# Patient Record
Sex: Male | Born: 1945 | Race: White | Hispanic: No | Smoking: Former smoker
Health system: Southern US, Community
[De-identification: ages and names within clinical notes are randomized; demographics above are authoritative.]

## PROBLEM LIST (undated history)

## (undated) DIAGNOSIS — J189 Pneumonia, unspecified organism: Secondary | ICD-10-CM

## (undated) DIAGNOSIS — IMO0002 Reserved for concepts with insufficient information to code with codable children: Secondary | ICD-10-CM

## (undated) DIAGNOSIS — I4819 Other persistent atrial fibrillation: Secondary | ICD-10-CM

## (undated) DIAGNOSIS — I10 Essential (primary) hypertension: Secondary | ICD-10-CM

## (undated) DIAGNOSIS — Z7901 Long term (current) use of anticoagulants: Secondary | ICD-10-CM

## (undated) DIAGNOSIS — E785 Hyperlipidemia, unspecified: Secondary | ICD-10-CM

## (undated) DIAGNOSIS — T8859XA Other complications of anesthesia, initial encounter: Secondary | ICD-10-CM

## (undated) HISTORY — PX: HIP ARTHROSCOPY: SUR88

## (undated) HISTORY — PX: APPENDECTOMY: SHX54

## (undated) HISTORY — DX: Hyperlipidemia, unspecified: E78.5

## (undated) HISTORY — PX: CARDIOVERSION: SHX1299

## (undated) HISTORY — PX: WISDOM TOOTH EXTRACTION: SHX21

## (undated) HISTORY — DX: Reserved for concepts with insufficient information to code with codable children: IMO0002

## (undated) HISTORY — DX: Other persistent atrial fibrillation: I48.19

## (undated) HISTORY — PX: SKIN CANCER EXCISION: SHX779

---
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
## 1968-01-22 HISTORY — PX: APPENDECTOMY: SHX54

## 2000-04-03 ENCOUNTER — Ambulatory Visit (HOSPITAL_COMMUNITY): Admission: RE | Admit: 2000-04-03 | Discharge: 2000-04-03 | Payer: Self-pay | Admitting: Gastroenterology

## 2001-03-12 ENCOUNTER — Encounter: Admission: RE | Admit: 2001-03-12 | Discharge: 2001-03-12 | Payer: Self-pay | Admitting: Family Medicine

## 2001-03-12 ENCOUNTER — Encounter: Payer: Self-pay | Admitting: Family Medicine

## 2003-03-07 ENCOUNTER — Ambulatory Visit (HOSPITAL_COMMUNITY): Admission: RE | Admit: 2003-03-07 | Discharge: 2003-03-07 | Payer: Self-pay | Admitting: Family Medicine

## 2004-08-20 ENCOUNTER — Ambulatory Visit: Payer: Self-pay | Admitting: Cardiology

## 2004-08-30 ENCOUNTER — Ambulatory Visit: Payer: Self-pay

## 2005-08-21 ENCOUNTER — Ambulatory Visit: Payer: Self-pay | Admitting: Cardiology

## 2006-08-25 ENCOUNTER — Ambulatory Visit: Payer: Self-pay | Admitting: Cardiology

## 2006-09-02 ENCOUNTER — Ambulatory Visit: Payer: Self-pay

## 2006-09-02 ENCOUNTER — Encounter: Payer: Self-pay | Admitting: Cardiology

## 2007-05-02 ENCOUNTER — Emergency Department (HOSPITAL_COMMUNITY): Admission: EM | Admit: 2007-05-02 | Discharge: 2007-05-02 | Payer: Self-pay | Admitting: Emergency Medicine

## 2007-05-07 ENCOUNTER — Ambulatory Visit: Payer: Self-pay | Admitting: Cardiology

## 2007-06-08 ENCOUNTER — Ambulatory Visit: Payer: Self-pay | Admitting: Cardiology

## 2007-07-03 ENCOUNTER — Emergency Department (HOSPITAL_COMMUNITY): Admission: EM | Admit: 2007-07-03 | Discharge: 2007-07-03 | Payer: Self-pay | Admitting: Emergency Medicine

## 2007-07-03 ENCOUNTER — Ambulatory Visit: Payer: Self-pay | Admitting: Internal Medicine

## 2007-07-13 ENCOUNTER — Ambulatory Visit: Payer: Self-pay | Admitting: Internal Medicine

## 2007-08-04 ENCOUNTER — Ambulatory Visit: Payer: Self-pay | Admitting: Cardiology

## 2008-02-02 ENCOUNTER — Ambulatory Visit: Payer: Self-pay | Admitting: Cardiology

## 2008-05-17 ENCOUNTER — Encounter: Admission: RE | Admit: 2008-05-17 | Discharge: 2008-05-17 | Payer: Self-pay | Admitting: Family Medicine

## 2008-06-15 ENCOUNTER — Encounter (INDEPENDENT_AMBULATORY_CARE_PROVIDER_SITE_OTHER): Payer: Self-pay | Admitting: *Deleted

## 2008-07-01 ENCOUNTER — Encounter: Payer: Self-pay | Admitting: Cardiology

## 2008-07-14 ENCOUNTER — Encounter: Payer: Self-pay | Admitting: Cardiology

## 2008-08-13 DIAGNOSIS — I4891 Unspecified atrial fibrillation: Secondary | ICD-10-CM | POA: Insufficient documentation

## 2008-08-13 DIAGNOSIS — I421 Obstructive hypertrophic cardiomyopathy: Secondary | ICD-10-CM | POA: Insufficient documentation

## 2008-08-13 DIAGNOSIS — E782 Mixed hyperlipidemia: Secondary | ICD-10-CM | POA: Insufficient documentation

## 2008-08-13 DIAGNOSIS — E785 Hyperlipidemia, unspecified: Secondary | ICD-10-CM

## 2008-08-24 ENCOUNTER — Ambulatory Visit: Payer: Self-pay | Admitting: Cardiology

## 2008-08-24 DIAGNOSIS — R0989 Other specified symptoms and signs involving the circulatory and respiratory systems: Secondary | ICD-10-CM | POA: Insufficient documentation

## 2008-08-29 ENCOUNTER — Ambulatory Visit: Payer: Self-pay

## 2008-08-29 ENCOUNTER — Encounter: Payer: Self-pay | Admitting: Cardiovascular Disease

## 2009-01-04 ENCOUNTER — Encounter (INDEPENDENT_AMBULATORY_CARE_PROVIDER_SITE_OTHER): Payer: Self-pay | Admitting: *Deleted

## 2009-03-02 ENCOUNTER — Ambulatory Visit: Payer: Self-pay | Admitting: Cardiology

## 2009-03-28 ENCOUNTER — Ambulatory Visit (HOSPITAL_COMMUNITY): Admission: RE | Admit: 2009-03-28 | Discharge: 2009-03-28 | Payer: Self-pay | Admitting: Cardiology

## 2009-03-28 ENCOUNTER — Ambulatory Visit: Payer: Self-pay

## 2009-03-28 ENCOUNTER — Ambulatory Visit: Payer: Self-pay | Admitting: Cardiovascular Disease

## 2009-03-28 ENCOUNTER — Encounter: Payer: Self-pay | Admitting: Cardiology

## 2009-03-29 ENCOUNTER — Encounter: Payer: Self-pay | Admitting: Physician Assistant

## 2009-03-29 ENCOUNTER — Telehealth: Payer: Self-pay | Admitting: Cardiology

## 2009-03-29 ENCOUNTER — Ambulatory Visit: Payer: Self-pay | Admitting: Cardiovascular Disease

## 2009-04-03 ENCOUNTER — Encounter: Payer: Self-pay | Admitting: Cardiology

## 2009-04-03 ENCOUNTER — Encounter: Admission: RE | Admit: 2009-04-03 | Discharge: 2009-04-03 | Payer: Self-pay | Admitting: Family Medicine

## 2009-04-12 ENCOUNTER — Ambulatory Visit: Payer: Self-pay | Admitting: Cardiology

## 2009-05-08 ENCOUNTER — Ambulatory Visit: Payer: Self-pay

## 2009-05-08 ENCOUNTER — Ambulatory Visit: Payer: Self-pay | Admitting: Cardiology

## 2009-08-21 ENCOUNTER — Encounter (INDEPENDENT_AMBULATORY_CARE_PROVIDER_SITE_OTHER): Payer: Self-pay | Admitting: *Deleted

## 2009-08-21 ENCOUNTER — Telehealth: Payer: Self-pay | Admitting: Cardiology

## 2009-08-21 ENCOUNTER — Ambulatory Visit: Payer: Self-pay | Admitting: Cardiology

## 2009-08-21 DIAGNOSIS — I4892 Unspecified atrial flutter: Secondary | ICD-10-CM | POA: Insufficient documentation

## 2009-08-21 DIAGNOSIS — R9431 Abnormal electrocardiogram [ECG] [EKG]: Secondary | ICD-10-CM | POA: Insufficient documentation

## 2009-08-28 ENCOUNTER — Ambulatory Visit: Payer: Self-pay | Admitting: Internal Medicine

## 2009-12-25 ENCOUNTER — Encounter: Payer: Self-pay | Admitting: Internal Medicine

## 2009-12-25 ENCOUNTER — Telehealth: Payer: Self-pay | Admitting: Internal Medicine

## 2009-12-25 ENCOUNTER — Ambulatory Visit: Payer: Self-pay

## 2009-12-27 ENCOUNTER — Ambulatory Visit: Payer: Self-pay | Admitting: Internal Medicine

## 2009-12-27 LAB — CONVERTED CEMR LAB
BUN: 19 mg/dL (ref 6–23)
Basophils Absolute: 0 10*3/uL (ref 0.0–0.1)
Basophils Relative: 0.5 % (ref 0.0–3.0)
CO2: 29 meq/L (ref 19–32)
Calcium: 9.2 mg/dL (ref 8.4–10.5)
Chloride: 102 meq/L (ref 96–112)
Creatinine, Ser: 1 mg/dL (ref 0.4–1.5)
Eosinophils Absolute: 0.2 10*3/uL (ref 0.0–0.7)
Eosinophils Relative: 2.9 % (ref 0.0–5.0)
GFR calc non Af Amer: 79.32 mL/min (ref >60.00)
Glucose, Bld: 92 mg/dL (ref 70–99)
HCT: 45.4 % (ref 39.0–52.0)
Hemoglobin: 15.6 g/dL (ref 13.0–17.0)
Lymphocytes Relative: 47.4 % — ABNORMAL HIGH (ref 12.0–46.0)
Lymphs Abs: 2.7 10*3/uL (ref 0.7–4.0)
MCHC: 34.3 g/dL (ref 30.0–36.0)
MCV: 90.2 fL (ref 78.0–100.0)
Magnesium: 2.1 mg/dL (ref 1.5–2.5)
Monocytes Absolute: 0.5 10*3/uL (ref 0.1–1.0)
Monocytes Relative: 8.1 % (ref 3.0–12.0)
Neutro Abs: 2.4 10*3/uL (ref 1.4–7.7)
Neutrophils Relative %: 41.1 % — ABNORMAL LOW (ref 43.0–77.0)
Platelets: 190 10*3/uL (ref 150.0–400.0)
Potassium: 4.1 meq/L (ref 3.5–5.1)
RBC: 5.04 M/uL (ref 4.22–5.81)
RDW: 12.6 % (ref 11.5–14.6)
Sodium: 137 meq/L (ref 135–145)
WBC: 5.7 10*3/uL (ref 4.5–10.5)

## 2009-12-28 ENCOUNTER — Encounter: Payer: Self-pay | Admitting: Internal Medicine

## 2010-01-02 ENCOUNTER — Telehealth: Payer: Self-pay | Admitting: Internal Medicine

## 2010-01-03 ENCOUNTER — Encounter: Payer: Self-pay | Admitting: Internal Medicine

## 2010-01-08 ENCOUNTER — Telehealth: Payer: Self-pay | Admitting: Internal Medicine

## 2010-01-26 ENCOUNTER — Encounter (INDEPENDENT_AMBULATORY_CARE_PROVIDER_SITE_OTHER): Payer: Self-pay | Admitting: *Deleted

## 2010-01-26 ENCOUNTER — Ambulatory Visit
Admission: RE | Admit: 2010-01-26 | Discharge: 2010-01-26 | Payer: Self-pay | Source: Home / Self Care | Attending: Internal Medicine | Admitting: Internal Medicine

## 2010-01-26 ENCOUNTER — Encounter: Payer: Self-pay | Admitting: Internal Medicine

## 2010-02-15 ENCOUNTER — Other Ambulatory Visit: Payer: Self-pay | Admitting: Internal Medicine

## 2010-02-15 ENCOUNTER — Ambulatory Visit
Admission: RE | Admit: 2010-02-15 | Discharge: 2010-02-15 | Payer: Self-pay | Source: Home / Self Care | Attending: Internal Medicine | Admitting: Internal Medicine

## 2010-02-15 LAB — CBC WITH DIFFERENTIAL/PLATELET
Basophils Absolute: 0 10*3/uL (ref 0.0–0.1)
Basophils Relative: 0.3 % (ref 0.0–3.0)
Eosinophils Absolute: 0.2 10*3/uL (ref 0.0–0.7)
Eosinophils Relative: 3 % (ref 0.0–5.0)
HCT: 47 % (ref 39.0–52.0)
Hemoglobin: 16 g/dL (ref 13.0–17.0)
Lymphocytes Relative: 42.1 % (ref 12.0–46.0)
Lymphs Abs: 2.8 10*3/uL (ref 0.7–4.0)
MCHC: 34 g/dL (ref 30.0–36.0)
MCV: 90.6 fl (ref 78.0–100.0)
Monocytes Absolute: 0.4 10*3/uL (ref 0.1–1.0)
Monocytes Relative: 5.9 % (ref 3.0–12.0)
Neutro Abs: 3.3 10*3/uL (ref 1.4–7.7)
Neutrophils Relative %: 48.7 % (ref 43.0–77.0)
Platelets: 209 10*3/uL (ref 150.0–400.0)
RBC: 5.19 Mil/uL (ref 4.22–5.81)
RDW: 12.9 % (ref 11.5–14.6)
WBC: 6.7 10*3/uL (ref 4.5–10.5)

## 2010-02-15 LAB — BASIC METABOLIC PANEL
BUN: 15 mg/dL (ref 6–23)
CO2: 32 mEq/L (ref 19–32)
Calcium: 9.6 mg/dL (ref 8.4–10.5)
Chloride: 99 mEq/L (ref 96–112)
Creatinine, Ser: 0.9 mg/dL (ref 0.4–1.5)
GFR: 86.21 mL/min (ref >60.00)
Glucose, Bld: 77 mg/dL (ref 70–99)
Potassium: 4.8 mEq/L (ref 3.5–5.1)
Sodium: 139 mEq/L (ref 135–145)

## 2010-02-15 LAB — PROTIME-INR
INR: 1.3 ratio — ABNORMAL HIGH (ref 0.8–1.0)
Prothrombin Time: 14.3 s — ABNORMAL HIGH (ref 10.2–12.4)

## 2010-02-15 LAB — APTT: aPTT: 52.9 s — ABNORMAL HIGH (ref 21.7–28.8)

## 2010-02-20 NOTE — Assessment & Plan Note (Signed)
Summary: ROV- a- fib  Medications Added ASPIRIN 325 MG TABS (ASPIRIN) take one tablet by mouth once daily      Allergies Added: NKDA  Primary Provider:  Bradd Canary  CC:  Add on- A-fib. The pt noticed an irregular heart beat last night that continued to this morning. He has not had any c/o an irregular heart beat since 2009. The pt states he has temporarily been using mobic/soma/ and hydrocodone for back pain. He has also been taking doxycycline for bronchitis. Marland Kitchen  History of Present Illness: This is a 65 year old white male patient, who has history of atrial fibrillation back in 2009 at which time he converted to normal sinus rhythm on Norpace and metoprolol. He recently saw Dr. Elijah Birk wall back in followup and was doing well without any problems. March 02, 2009. 2-D echo was ordered to followup hypertrophic cardiomyopathy. 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 There was no SAM or LVOT gradient. Ejection fraction was 55-60%.  Yesterday after the echo he noticed his heart was beating irregularly. This lasted throughout the night and again this morning. The patient states he developed an upper respiratory infection last Monday. He had significant bronchitis and saw his primary care physician on Friday, at which time he was placed on doxycycline. He says he was coughing quite a bit, but never did take any over-the-counter medications for this.He was asked to come in for evaluation is found to be back in atrial fibrillation.He denies chest pain, palpitations, dyspnea, and dyspnea on exertion, dizziness, or presyncope.  Current Medications (verified): 1)  Lipitor 10 Mg Tabs (Atorvastatin Calcium) .Marland Kitchen.. 1 Tab Once Daily 2)  Metoprolol Tartrate 25 Mg Tabs (Metoprolol Tartrate) .Marland Kitchen.. 1 Tab Two Times A Day 3)  Norpace 150 Mg Caps (Disopyramide Phosphate) .Marland Kitchen.. 1 Cap Two Times A Day 4)  Aspirin 81 Mg Tbec (Aspirin) .... Take One Tablet By Mouth Daily 5)   Multivitamins   Tabs (Multiple Vitamin) .Marland Kitchen.. 1 Tab Once Daily 6)  Niaspan 1000 Mg Cr-Tabs (Niacin (Antihyperlipidemic)) .Marland Kitchen.. 1 Tab Once Daily 7)  Fish Oil 1000 Mg Caps (Omega-3 Fatty Acids) .Marland Kitchen.. 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 Neck: No JVD, HJR, Bruit, or thyroid enlargement Lungs: No tachypnea, clear without wheezing, rales, or rhonchi Cardiovascular:irregular, PMI not displaced, heart sounds normal, no murmurs, gallops, bruit, thrill, or heave. Abdomen: BS normal. Soft without organomegaly, masses, lesions or tenderness. Extremities: without cyanosis, clubbing or edema. Good distal pulses bilateral SKin: Warm, no lesions or rashes  Musculoskeletal: No deformities Neuro: no focal signs    EKG  Procedure date:  03/29/2009  Findings:      atrial fibrillation at a rate of 73 beats per minute, deep T-wave inversion laterally, QT interval of 440 ms,QT control 484 ms  Impression & Recommendations:  Problem # 1:  PAROXYSMAL ATRIAL FIBRILLATION (ICD-427.31) I spoke with  Dr. Sharrell Ku about this patient given his hypertrophic cardiomyopathy and atrial fibrillation. He feels it was triggered by his upper respiratory infection in hopes that over time he will convert back to normal sinus rhythm.If he develops any symptoms of heart failure,he is to call us.He has a low [place] score. His updated medication list for this problem  includes:    Metoprolol Tartrate 25 Mg Tabs (Metoprolol tartrate) .Marland Kitchen... 1 tab two times a day    Norpace 150 Mg Caps (Disopyramide phosphate) .Marland Kitchen... 1 cap two times a day    Aspirin 325 Mg Tabs (Aspirin) .Marland Kitchen... 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 Continue current therapy. His updated medication list for this problem includes:    Metoprolol Tartrate 25 Mg Tabs (Metoprolol tartrate) .Marland Kitchen... 1 tab two times a day    Norpace 150 Mg Caps (Disopyramide phosphate) .Marland Kitchen... 1 cap two times a day    Aspirin 325 Mg Tabs (Aspirin) .Marland Kitchen... Take one tablet by mouth once daily  Patient Instructions: 1)  Your physician recommends that you schedule a follow-up appointment in: 1-2 weeks with Dr. Daleen Squibb. 2)  Your physician has recommended you make the following change in your medication: increase your aspirin to 325 mg daily. 3)  Also, limit your exercise for now.

## 2010-02-20 NOTE — Assessment & Plan Note (Signed)
Summary: 2wk f/u sl  Medications Added LORATADINE 10 MG TABS (LORATADINE) 1 tab once daily      Allergies Added: NKDA  Visit Type:  2 wk f/u Primary Provider:  Bradd Canary  CC:  pt states he was in Afib for a couple of days...denies any cp or sob or edema.  History of Present Illness: Ronnie Black comes in today for followup of his nonobstructive hypertrophic cardiomyopathy and recent recurrence of atrial fibrillation in the setting of a case of respiratory infection.  He is now feeling remarkably better and felt like he went back into normal rhythm of the weekend. His EKG today and farms normal sinus rhythm and is stable. He's back to exercising.  He did come to the office as instructed a couple weeks ago. EKG at that time showed atrial fibrillation.  He describes what sounds like walking pneumonia.  Echocardiogram on March 8 demonstrated normal left ventricular function ejection fraction 60%, hypertrophic cardiomyopathy, no outlet obstruction or gradient, mild left atrial enlargement, mild mitral regurgitation, no SAM. Findings consistent with a nonobstructive mid and apical hypertrophic cardiomyopathy. Findings are stable  Current Medications (verified): 1)  Lipitor 10 Mg Tabs (Atorvastatin Calcium) .Marland Kitchen.. 1 Tab Once Daily 2)  Metoprolol Tartrate 25 Mg Tabs (Metoprolol Tartrate) .Marland Kitchen.. 1 Tab Two Times A Day 3)  Norpace 150 Mg Caps (Disopyramide Phosphate) .Marland Kitchen.. 1 Cap Two Times A Day 4)  Aspirin 325 Mg Tabs (Aspirin) .... Take One Tablet By Mouth Once Daily 5)  Multivitamins   Tabs (Multiple Vitamin) .Marland Kitchen.. 1 Tab Once Daily 6)  Niaspan 1000 Mg Cr-Tabs (Niacin (Antihyperlipidemic)) .Marland Kitchen.. 1 Tab Once Daily 7)  Fish Oil 1000 Mg Caps (Omega-3 Fatty Acids) .Marland Kitchen.. 1 Cap Once Daily 8)  Loratadine 10 Mg Tabs (Loratadine) .Marland Kitchen.. 1 Tab Once Daily  Allergies (verified): No Known Drug Allergies  Past History:  Past Medical History: Last updated: 08/13/2008 HYPERTROPHIC OBSTRUCTIVE CARDIOMYOPATHY  (ICD-425.1) PAROXYSMAL ATRIAL FIBRILLATION (ICD-427.31) HYPERLIPIDEMIA-MIXED (ICD-272.4)    Family History: Last updated: 08/13/2008 Family History of Coronary Artery Disease: Brother s/p CABG His grandfather died at an old age from unclear causes but no clear sudden cardiac death.  There is no history of sudden cardiac death of other family members or known hypertrophic cardiomyopathy.        Social History: Last updated: 08/13/2008 Full Time..is a professor of Investment banker, corporate at [employer]  Tobacco Use - No.  Alcohol Use - yes..social Drug Use - no  Risk Factors: Smoking Status: never (08/13/2008)  Review of Systems       negative other than history of present illness.  Vital Signs:  Patient profile:   65 year old male Height:      66 inches Weight:      154 pounds BMI:     24.95 Pulse rate:   76 / minute Pulse rhythm:   irregular BP sitting:   112 / 70  (left arm) Cuff size:   large  Vitals Entered By: Danielle Rankin, CMA (April 12, 2009 3:46 PM)  Physical Exam  General:  Well developed, well nourished, in no acute distress. Head:  normocephalic and atraumatic Eyes:  PERRLA/EOM intact; conjunctiva and lids normal. Neck:  Neck supple, no JVD. No masses, thyromegaly or abnormal cervical nodes. Chest Ronnie Black:  no deformities or breast masses noted Lungs:  relatively clear Heart:  regular rate and rhythm, normal S1-S2, no significant murmur.   EKG  Procedure date:  04/12/2009  Findings:      normal sinus  rhythm, T-wave changes inferiorly and V3 through V6, back to baseline.  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 His updated medication list for this problem includes:    Metoprolol Tartrate 25 Mg Tabs (Metoprolol tartrate) .Marland Kitchen... 1 tab two times a day    Norpace 150 Mg Caps  (Disopyramide phosphate) .Marland Kitchen... 1 cap two times a day    Aspirin 325 Mg Tabs (Aspirin) .Marland Kitchen... Take one tablet by mouth once daily  Problem # 2:  PAROXYSMAL ATRIAL FIBRILLATION (ICD-427.31) Assessment: Improved  His updated medication list for this problem includes:    Metoprolol Tartrate 25 Mg Tabs (Metoprolol tartrate) .Marland Kitchen... 1 tab two times a day    Norpace 150 Mg Caps (Disopyramide phosphate) .Marland Kitchen... 1 cap two times a day    Aspirin 325 Mg Tabs (Aspirin) .Marland Kitchen... Take one tablet by mouth once daily  Orders: EKG w/ Interpretation (93000) Treadmill (Treadmill)  Patient Instructions: 1)  Your physician recommends that you schedule a follow-up appointment in: STRESS WITH DR Audreyanna Butkiewicz  2)  Your physician recommends that you continue on your current medications as directed. Please refer to the Current Medication list given to you today. 3)  Your physician has requested that you have an exercise tolerance test.  For further information please visit [email or web address]  Please also follow instruction sheet, as given.

## 2010-02-20 NOTE — Assessment & Plan Note (Signed)
Summary: ekg/afib/saf  Nurse Visit   Vital Signs:  Patient profile:   65 year old male Pulse rate:   88 / minute Pulse rhythm:   irregular BP sitting:   116 / 76  (left arm) Cuff size:   regular  Vitals Entered By: Lisabeth Devoid RN (December 25, 2009 2:45 PM)   Current Medications (verified): 1)  Lipitor 10 Mg Tabs (Atorvastatin Calcium) .Marland Kitchen.. 1 Tab Once Daily 2)  Metoprolol Tartrate 25 Mg Tabs (Metoprolol Tartrate) .Marland Kitchen.. 1 Tab Two Times A Day 3)  Norpace 150 Mg Caps (Disopyramide Phosphate) .Marland Kitchen.. 1 Cap Two Times A Day 4)  Aspirin 81 Mg Tbec (Aspirin) .... Take One Tablet By Mouth Daily 5)  Multivitamins   Tabs (Multiple Vitamin) .Marland Kitchen.. 1 Tab Once Daily 6)  Niaspan 1000 Mg Cr-Tabs (Niacin (Antihyperlipidemic)) .Marland Kitchen.. 1 Tab Once Daily 7)  Fish Oil 1000 Mg Caps (Omega-3 Fatty Acids) .Marland Kitchen.. 1 Cap Once Daily 8)  Loratadine 10 Mg Tabs (Loratadine) .... As Needed 9)  Vitamin D 1000 Unit Tabs (Cholecalciferol) .... Once Daily  Allergies (verified): No Known Drug Allergies  Visit Type:  Follow-up  CC:  irregular heart beat.

## 2010-02-20 NOTE — Miscellaneous (Signed)
Summary: med update  Clinical Lists Changes  Medications: Changed medication from NORPACE 150 MG CAPS (DISOPYRAMIDE PHOSPHATE) 1 cap two times a day to NORPACE 150 MG CAPS (DISOPYRAMIDE PHOSPHATE) 1 cap three times a day

## 2010-02-20 NOTE — Progress Notes (Signed)
Summary: having cardiac arthmia'a since yesterday   Phone Note Call from Patient   Caller: Patient [phone] Reason for Call: Talk to Nurse Summary of Call: having cardiac arthmia's since yesterday wants a [phone] Initial call taken by: Glynda Jaeger,  August 21, 2009 8:19 AM  Follow-up for Phone Call        I spoke with patient who is having an irregular heartbeat.  He says his heart rate is up to 120 with exertion ( walking upstairs)  but much lower sitting at his desk. He is not having any dyspnea nor edema.  He will be coming in at 11:00am for ekg.  Lisabeth Devoid RN       Appended Document: having cardiac arthmia'a since yesterday [name] showed atrial flutter  Heart rate 82.  No sob at rest or with exertion.  DOD Dr. Eden Emms increased norpace to three times a day for today. Also suggested EP appt. for possible atral flutter eval/ablation.  Appt. Made for 08/28/09 will follow up with Dr. Daleen Squibb 08/22/09 at 2:15pm  Appended Document: having cardiac arthmia'a since yesterday seeing EP Monday 8/8  Appended Document: having cardiac arthmia'a since yesterday  Reviewed Juanito Doom, MD  Appended Document: having cardiac arthmia'a since yesterday Pt is feeling better today.  He states he is not having any accelerated heart rate walking 1 flight of stairs.  He also says he feels to be back in normal rhythm.   He will continue Norpace  two times a day and follow-up with Dr. Johney Frame on 08/28/09. Lisabeth Devoid RN

## 2010-02-20 NOTE — Assessment & Plan Note (Signed)
Summary: F6M/ANAS  Medications Added NORPACE 150 MG CAPS (DISOPYRAMIDE PHOSPHATE) 1 cap two times a day      Allergies Added: NKDA  Visit Type:  6 mo f/u Primary Provider:  Bradd Canary   History of Present Illness: Ronnie Black returns for followup and management of his hypertrophic obstructive cardiomyopathy, paroxysmal atrial fibrillation, next hyperlipidemia.  I saw him last in August of 2010. He is unremarkably well with no recurrent atrial fibrillation on Norpace and metoprolol.  His mixed hyperlipidemia is being managed by primary care. He is actually the process of obtaining an [facility]. primary care physician.  His meds are unchanged. He is very compliant. He continues to exercise on a regular basis.  Current Medications (verified): 1)  Lipitor 10 Mg Tabs (Atorvastatin Calcium) .Marland Kitchen.. 1 Tab Once Daily 2)  Metoprolol Tartrate 25 Mg Tabs (Metoprolol Tartrate) .Marland Kitchen.. 1 Tab Two Times A Day 3)  Norpace 150 Mg Caps (Disopyramide Phosphate) .Marland Kitchen.. 1 Cap Two Times A Day 4)  Aspirin 81 Mg Tbec (Aspirin) .... Take One Tablet By Mouth Daily 5)  Multivitamins   Tabs (Multiple Vitamin) .Marland Kitchen.. 1 Tab Once Daily 6)  Niaspan 1000 Mg Cr-Tabs (Niacin (Antihyperlipidemic)) .Marland Kitchen.. 1 Tab Once Daily 7)  Fish Oil 1000 Mg Caps (Omega-3 Fatty Acids) .Marland Kitchen.. 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  There is no history of sudden cardiac death of other family members or known hypertrophic cardiomyopathy.        Social History: Last updated: 08/13/2008 Full Time..is a professor of Investment banker, corporate at  [employer]  Tobacco Use - No.  Alcohol Use - yes..social Drug Use - no  Risk Factors: Smoking Status: never (08/13/2008)  Review of Systems       negative other history of present illness  Vital Signs:  Patient profile:   65 year old male Height:      66 inches Weight:      157 pounds BMI:     25.43 Pulse rate:   53 / minute Pulse rhythm:   irregular BP sitting:   118 / 70  (left arm) Cuff size:   large  Vitals Entered By: Danielle Rankin, CMA (March 02, 2009 3:43 PM)  Physical Exam  General:  Well developed, well nourished, in no acute distress. Head:  normocephalic and atraumatic Eyes:  PERRLA/EOM intact; conjunctiva and lids normal. Neck:  Neck supple, no JVD. No masses, thyromegaly or abnormal cervical nodes. Lungs:  Clear bilaterally to auscultation and percussion. Heart:  regular rate and rhythm, soft systolic murmur along left sternal border. Abdomen:  Bowel sounds positive; abdomen soft and non-tender without masses, organomegaly, or hernias noted. No hepatosplenomegaly. Msk:  Back normal, normal gait. Muscle strength and tone normal. Pulses:  pulses normal in all 4 extremities Extremities:  No clubbing or cyanosis. Neurologic:  Alert and oriented x 3. Skin:  Intact without lesions or rashes. Psych:  Normal affect.   EKG  Procedure date:  03/02/2009  Findings:      sinus bradycardia, ST segment changes diffuse leads throughout, unchanged  Impression & Recommendations:  Problem # 1:  PAROXYSMAL ATRIAL FIBRILLATION (ICD-427.31) Assessment Improved  His updated medication list for  this problem includes:    Metoprolol Tartrate 25 Mg Tabs (Metoprolol tartrate) .Marland Kitchen... 1 tab two times a day    Norpace 150 Mg Caps (Disopyramide phosphate) .Marland Kitchen... 1 cap two times a day    Aspirin 81 Mg Tbec (Aspirin) .Marland Kitchen... Take one tablet by mouth daily  Problem # 2:  HYPERTROPHIC OBSTRUCTIVE CARDIOMYOPATHY (ICD-425.1) Assessment: Unchanged Though he is stable clinically,  has been several years since we performed a 2-D echocardiogram. We will obtain this. I will see him back again in 6 months. His updated medication list for this problem includes:    Metoprolol Tartrate 25 Mg Tabs (Metoprolol tartrate) .Marland Kitchen... 1 tab two times a day    Norpace 150 Mg Caps (Disopyramide phosphate) .Marland Kitchen... 1 cap two times a day    Aspirin 81 Mg Tbec (Aspirin) .Marland Kitchen... Take one tablet by mouth daily  His updated medication list for this problem includes:    Metoprolol Tartrate 25 Mg Tabs (Metoprolol tartrate) .Marland Kitchen... 1 tab two times a day    Norpace 150 Mg Caps (Disopyramide phosphate) .Marland Kitchen... 1 cap two times a day    Aspirin 81 Mg Tbec (Aspirin) .Marland Kitchen... Take one tablet by mouth daily  Orders: Echocardiogram (Echo)  Problem # 3:  HYPERLIPIDEMIA-MIXED (ICD-272.4) Assessment: Improved  His updated medication list for this problem includes:    Lipitor 10 Mg Tabs (Atorvastatin calcium) .Marland Kitchen... 1 tab once daily    Niaspan 1000 Mg Cr-tabs (Niacin (antihyperlipidemic)) .Marland Kitchen... 1 tab once daily  His updated medication list for this problem includes:    Lipitor 10 Mg Tabs (Atorvastatin calcium) .Marland Kitchen... 1 tab once daily    Niaspan 1000 Mg Cr-tabs (Niacin (antihyperlipidemic)) .Marland Kitchen... 1 tab once daily  Patient Instructions: 1)  Your physician recommends that you schedule a follow-up appointment in: 6 MONTHS WITH DR Ilene Witcher 2)  Your physician recommends that you continue on your current medications as directed. Please refer to the Current Medication list given to you today. 3)  Your physician has requested that you have an echocardiogram.  Echocardiography is a painless test that uses sound waves to create images of your heart. It provides your doctor with information about the size and shape of your heart and how well your heart's chambers and valves are working.  This procedure takes approximately one hour. There are no restrictions for this procedure. Prescriptions: NORPACE 150 MG CAPS (DISOPYRAMIDE  PHOSPHATE) 1 cap two times a day  #60 x 11   Entered by:   Danielle Rankin, CMA   Authorized by:   Gaylord Shih, MD, [facility]   Signed by:   Danielle Rankin, CMA on 03/02/2009   Method used:   Electronically to        [facility]. [phone]* (retail)       [phone]       [place], [place]  [address]       Ph: [phone]       Fax: [phone]   RxID:   [id number]

## 2010-02-20 NOTE — Assessment & Plan Note (Signed)
Summary: new eval for a-fib/sl  Medications Added NORPACE 150 MG CAPS (DISOPYRAMIDE PHOSPHATE) 1 cap two times a day ASPIRIN 81 MG TBEC (ASPIRIN) Take one tablet by mouth daily LORATADINE 10 MG TABS (LORATADINE) as needed VITAMIN D 1000 UNIT TABS (CHOLECALCIFEROL) once daily      Allergies Added: NKDA  Visit Type:  Initial Consult Referring Provider:  Dr Daleen Squibb Primary Provider:  Bradd Canary   History of Present Illness: Ronnie Black is a pleasant 65 yo WM with a h/o nonobstructive hypertrophic CM, paroxysmal atrial fibrillation, and atrial flutter who presents today for EP consultation regarding atrial arrhythmias.  He reports initially being diagnosed with atrial fibrillation 03/2007 following an URI.  His afib spontaneously terminated and he did well until June 2009.  He was placed on Norpace.  He did well until 3/11 without further afib.  3/11, he reports having a URI and atrial fibrillation which spontaneously terminated.  He reports doing well until Sunday 08/20/09 when he developed palpitations.  He then reports having rapid palpitations.  He reports having fatigue and nervousness.  He Was evaluated 08/21/09 in our office and found to have typical appearing atrial flutter.  He took an extra Norpace and then returned to sinus rhythm.  He has had no further arrhythmias since that time.  He is unaware of any triggers or precipitants for his recent episode.  He had been doing yard work and was also stung 6 or7 times prior to his afib.   He denies associated CP or SOB.  He is otherwise without complaint today.  Current Medications (verified): 1)  Lipitor 10 Mg Tabs (Atorvastatin Calcium) .... 1 Tab Once Daily 2)  Metoprolol Tartrate 25 Mg Tabs (Metoprolol Tartrate) .... 1 Tab Two Times A Day 3)  Norpace 150 Mg Caps (Disopyramide Phosphate) .... 1 Cap Two Times A Day 4)  Aspirin 81 Mg Tbec (Aspirin) .... Take One Tablet By Mouth Daily 5)  Multivitamins   Tabs (Multiple Vitamin) .... 1 Tab Once  Daily 6)  Niaspan 1000 Mg Cr-Tabs (Niacin (Antihyperlipidemic)) .... 1 Tab Once Daily 7)  Fish Oil 1000 Mg Caps (Omega-3 Fatty Acids) .... 1 Cap Once Daily 8)  Loratadine 10 Mg Tabs (Loratadine) .... As Needed 9)  Vitamin D 1000 Unit Tabs (Cholecalciferol) .... 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 Appendectomy  Family History: Reviewed history from 08/13/2008 and no changes required. Family History of Coronary Artery Disease: Brother s/p CABG His grandfather died at an old age from unclear causes but no clear sudden cardiac death.  There is no history of sudden cardiac death of other family members or known hypertrophic cardiomyopathy.        Social History: Full Time..is a professor of political science at [employer] Lives in [place]. Married  Tobacco Use - No.  Alcohol Use - yes glass of wine or a beer with dinner Drug Use - no  Review of Systems       All systems are reviewed and negative except as listed in the HPI.   Vital Signs:  Patient profile:   64 year old male Height:      66 inches Weight:      15 3 pounds Pulse rate:   68 / minute BP  sitting:   136 / 72  (left arm)  Vitals Entered By: Laurance Flatten CMA (August 28, 2009 4:06 PM)  Physical Exam  General:  Well developed, well nourished, in no acute distress. Head:  normocephalic and atraumatic Eyes:  PERRLA/EOM intact; conjunctiva and lids normal. Mouth:  Teeth, gums and palate normal. Oral mucosa normal. Neck:  Neck supple, no JVD. No masses, thyromegaly or abnormal cervical nodes. Lungs:  Clear bilaterally to auscultation and percussion. Heart:   regular rate and rhythm, normal S1-S2, no significant murmur. Abdomen:  Bowel sounds positive; abdomen soft and non-tender without masses, organomegaly, or hernias noted. No hepatosplenomegaly. Msk:  Back normal, normal gait. Muscle strength and tone normal. Pulses:  pulses normal in all 4 extremities Extremities:  No clubbing or cyanosis. Neurologic:  Alert and oriented x 3. Skin:  Intact without lesions or rashes. Cervical Nodes:  no significant adenopathy Psych:  Normal affect.   EKG  Procedure date:  08/21/2009  Findings:      typical appearing atrial flutter, V rate 80 bpm,  LVH with repolarization abnormality  Impression & Recommendations:  Problem # 1:  ATRIAL FIBRILLATION (ICD-427.31) The patient has both symptomatic atrial fibrillation and typical appearing atrial flutter.  Therapeutic strategies for afib and atrial flutter including medicine and ablation were discussed in detail with the patient today. Risk, benefits, and alternatives to EP study and radiofrequency ablation were also discussed in detail today.  The patient understands the risks and wishes to continue medical therapy as his episodes are presently rather infrequent.  His CHADS2 score is 0.  He will therefore take ASA 325mg  daily for stroke prevention at this time. If he has increasing frequency of episodes of either afib or atrial flutter, then I would recommend ablation at that time.  Problem # 2:  ATRIAL FLUTTER (ICD-427.32) as above  Problem # 3:  HYPERTROPHIC OBSTRUCTIVE CARDIOMYOPATHY (ICD-425.1) stable no changes today  Patient Instructions: 1)  Your physician recommends that you schedule a follow-up appointment in: 6 months with Dr Johney Frame

## 2010-02-20 NOTE — Letter (Signed)
Summary: Appointment - Reminder 2  [facility], Main Office  [address]   [place], [place] [address]   Phone: [phone]  Fax: [phone]     Jun 15, 2008 MRN: [id number]   Ronnie Black [address] [place], [place]  [address]   Dear [name],  Our records indicate that it is time to schedule a follow-up appointment.  Dr.Wall recommended that you follow up with [name] in July 2010. It is very important that we reach you to schedule this appointment. We look forward to participating in your health care needs. Please contact us at the number listed above at your earliest convenience to schedule your appointment.  If you are unable to make an appointment at this time, give [name] a call so we can update our records.     Sincerely,  Lorne Skeens  [facility] Scheduling Team

## 2010-02-20 NOTE — Letter (Signed)
Summary: Cardioversion/TEE Instructions  [facility], Main Office  [address]   [place], [place] [address]   Phone: [phone]  Fax: [phone]     TEE Cardioversion Instructions  12/28/2009 MRN: [id number]  Ronnie Black [address] [place], [place]  [address]  Dear Ronnie Black, You are scheduled for a TEE Cardioversion on 01/04/10 with Dr. Elease Hashimoto.   Please arrive at the [facility] at 12:30 p.m. on the day of your procedure.  1)   DIET:  A)   Nothing to eat or drink after midnight except your medications with a sip of water.    2)   Come to the [place] office on 12/27/09 for lab work. The lab at [facility] is open from 8:30 a.m. to 1:30 p.m. and 2:30 p.m. to 5:00 p.m. The lab at [facility] is open from 7:30 a.m. to 5:30 p.m. You do not have to be fasting.  3)   MAKE SURE YOU TAKE YOUR Pradaxa 150mg  two times a day  4)    B)   YOU MAY TAKE ALL of your  medications with a small amount of water.    5)  Must have a responsible person to drive you home.  6)   Bring a current list of your medications and current insurance cards.   * Special Note:  Every effort is made to have your procedure done on time. Occasionally there are emergencies that present themselves at the hospital that may cause delays. Please be patient if a delay does occur.  * If you have any questions after you get home, please call the office at [phone].  Anselm Pancoast

## 2010-02-20 NOTE — Assessment & Plan Note (Signed)
Summary: 6 MONTH ROV./SL  Medications Added LIPITOR 10 MG TABS (ATORVASTATIN CALCIUM) 1 tab once daily ASPIRIN 81 MG TBEC (ASPIRIN) Take one tablet by mouth daily NIASPAN 1000 MG CR-TABS (NIACIN (ANTIHYPERLIPIDEMIC)) 1 tab once daily FISH OIL 1000 MG CAPS (OMEGA-3 FATTY ACIDS) 1 cap once daily      Allergies Added: NKDA  Visit Type:  6 mo f/u Primary Provider:  Bradd Canary  CC:  no cardiac complaints today.  History of Present Illness: Ronnie Black returns today for further management of his hypertrophic obstructive cardiomyopathy, mixed hyperlipidemia which is being followed by Dr. Bradd Canary, and paroxysmal atrial fibrillation.  He's had no episodes of atrial fib area to his exercise tolerance is as good as it has been for some time. He denies orthopnea PND or peripheral edema. He's had no syncope.  He brings in labs today from primary care. Total cholesterol is 144, triglycerides were 76, HDL was 49, total cholesterol to HDL ratio was 2.9, and LDL was 80. His hemoglobin A1c was normal. Homocystine was normal as was his C. reactive protein  He also had a carotid thickness studies which showed his right carotid to be that of a 65 year old man! he has had no symptoms of TIAs or mini strokes.  Current Medications (verified): 1)  Lipitor 10 Mg Tabs (Atorvastatin Calcium) .Marland Kitchen.. 1 Tab Once Daily 2)  Metoprolol Tartrate 25 Mg Tabs (Metoprolol Tartrate) .Marland Kitchen.. 1 Tab Two Times A Day 3)  Norpace 150 Mg Caps (Disopyramide Phosphate) .Marland Kitchen.. 1 Cap Two Times A Day 4)  Aspirin 81 Mg Tbec (Aspirin) .... Take One Tablet By Mouth Daily 5)  Multivitamins   Tabs (Multiple Vitamin) .Marland Kitchen.. 1 Tab Once Daily 6)  Niaspan 1000 Mg Cr-Tabs (Niacin (Antihyperlipidemic)) .Marland Kitchen.. 1 Tab Once Daily 7)  Fish Oil 1000 Mg Caps (Omega-3 Fatty Acids) .Marland Kitchen.. 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  There is no history of sudden cardiac death of other family members or known hypertrophic cardiomyopathy.        Social History: Last updated: 08/13/2008 Full Time..is a professor of Investment banker, corporate at [employer]  Tobacco Use - No.  Alcohol Use - yes..social Drug Use - no  Risk Factors: Smoking Status: never (08/13/2008)  Review of Systems       negative other than the history of present illness  Vital Signs:  Patient profile:   65 year old male Height:      66 inches Weight:      155 pounds BMI:     25.11 Pulse rate:   64 / minute Pulse rhythm:   regular BP sitting:   116 / 70  (left arm) Cuff size:   large  Vitals Entered By: Danielle Rankin, CMA (August 24, 2008 4:12 PM)  Physical Exam  General:  Well developed, well nourished, in no acute distress. Head:  normocephalic and atraumatic Eyes:  PERRLA/EOM intact; conjunctiva and lids normal. Mouth:  Teeth, gums and palate normal. Oral mucosa normal. Neck:  Neck supple, no JVD. No masses, thyromegaly or abnormal cervical nodes. Chest Anahit Klumb:  no deformities or breast masses noted Lungs:  Clear bilaterally to auscultation and percussion. Heart:  systolic murmur which seems softer than usual. S2 splits. Either carotid bruit or referred sounds in right base the neck. Abdomen:  Bowel sounds positive; abdomen soft and non-tender without masses, organomegaly, or hernias noted. No hepatosplenomegaly. Msk:  Back normal, normal gait. Muscle strength and tone normal. Pulses:  pulses normal in all 4 extremities Extremities:  No clubbing or cyanosis. Neurologic:  Alert and oriented x 3. Skin:  Intact without lesions or rashes. Psych:  Normal  affect.   Impression & Recommendations:  Problem # 1:  CAROTID BRUIT (ICD-785.9) Assessment New  Orders: Carotid Duplex (Carotid Duplex) With his abnormal carotid intimal medial thickness studies, and a soft systolic sound in his right neck, we will obtain carotid Dopplers. I suspect he'll have very little plaque or nonobstructive plaque.  Problem # 2:  HYPERTROPHIC OBSTRUCTIVE CARDIOMYOPATHY (ICD-425.1) Assessment: Unchanged  His updated medication list for this problem includes:    Metoprolol Tartrate 25 Mg Tabs (Metoprolol tartrate) .Marland Kitchen... 1 tab two times a day    Norpace 150 Mg Caps (Disopyramide phosphate) .Marland Kitchen... 1 cap two times a day    Aspirin 81 Mg Tbec (Aspirin) .Marland Kitchen... Take one tablet by mouth daily  His updated medication list for this problem includes:    Metoprolol Tartrate 25 Mg Tabs (Metoprolol tartrate) .Marland Kitchen... 1 tab two times a day    Norpace 150 Mg Caps (Disopyramide phosphate) .Marland Kitchen... 1 cap two times a day    Aspirin 81 Mg Tbec (Aspirin) .Marland Kitchen... Take one tablet by mouth daily  Problem # 3:  PAROXYSMAL ATRIAL FIBRILLATION (ICD-427.31) Assessment: Improved  His updated medication list for this problem includes:    Metoprolol Tartrate 25 Mg Tabs (Metoprolol tartrate) .Marland Kitchen... 1 tab two times a day    Norpace 150 Mg Caps (Disopyramide phosphate) .Marland Kitchen... 1 cap two times a day    Aspirin 81 Mg Tbec (Aspirin) .Marland Kitchen... Take one tablet by mouth daily  His updated medication list for this problem includes:    Metoprolol Tartrate 25 Mg Tabs (Metoprolol tartrate) .Marland Kitchen... 1 tab two times a day    Norpace 150 Mg Caps (Disopyramide phosphate) .Marland Kitchen... 1 cap two times a day    Aspirin 81 Mg Tbec (Aspirin) .Marland Kitchen... Take one tablet by mouth daily  Problem # 4:  HYPERLIPIDEMIA-MIXED (ICD-272.4) Assessment: Unchanged  His updated medication list for this problem includes:    Lipitor 10 Mg Tabs (Atorvastatin calcium) .Marland Kitchen... 1 tab once daily    Niaspan 1000 Mg Cr-tabs (Niacin (antihyperlipidemic)) .Marland Kitchen...  1 tab once daily  His updated medication list for this problem includes:    Lipitor 10 Mg Tabs (Atorvastatin calcium) .Marland Kitchen... 1 tab once daily    Niaspan 1000 Mg Cr-tabs (Niacin (antihyperlipidemic)) .Marland Kitchen... 1 tab once daily  Patient Instructions: 1)  Your physician wants you to follow-up in: 6 MONTHS.  You will receive a reminder letter in the mail two months in advance. If you don't receive a letter, please call our office to schedule the follow-up appointment. 2)  Your physician has requested that you have a carotid duplex. This test is an ultrasound of the carotid arteries in your neck. It looks at blood flow through these arteries that supply the brain with blood. Allow one hour for this exam. There are no restrictions or special instructions. Prescriptions: NIASPAN 1000 MG CR-TABS (NIACIN (ANTIHYPERLIPIDEMIC)) 1 tab once daily  #30 x 11   Entered by:   Danielle Rankin, CMA   Authorized by:   Gaylord Shih, MD, [facility]   Signed  by:   Danielle Rankin, CMA on 08/24/2008   Method used:   Electronically to        [facility]. [phone]* (retail)       [phone] [address]       [place], [place]  [address]       Ph: [phone]       Fax: [phone]   RxID:   [id number] LIPITOR 10 MG TABS (ATORVASTATIN CALCIUM) 1 tab once daily  #30 x 11   Entered by:   Danielle Rankin, CMA   Authorized by:   Gaylord Shih, MD, [facility]   Signed by:   Danielle Rankin, CMA on 08/24/2008   Method used:   Electronically to        [facility]. [phone]* (retail)       [phone]       [place], [place]  [address]       Ph: [phone]       Fax: [phone]   RxID:   [id number]

## 2010-02-20 NOTE — Progress Notes (Signed)
Summary: Irregular heart beat   Phone Note Call from Patient Call back at Home Phone [phone]   Caller: Patient Reason for Call: Talk to Nurse Summary of Call: arrythmia started yesterday afternoon, request to be seen or speak with the dr Initial call taken by: Migdalia Dk,  March 29, 2009 8:24 AM  Follow-up for Phone Call        I spoke with the pt. He states he has an irregular heart beat that started late yesterday. He was not having any symptoms with this, but did notice the irregularity while doing a pulse check. He states he has been sick for a couple of days and has been on an antibiotic since friday. He states that his heart beat is still irregular this morning. The pt is on metoprolol tart 25 mg two times a day and norpace 150mg  two times a day. He took his meds this morning about 6:30am. He is on an aspirin 81mg  once daily. He has not noticed any irregular heart beats since 2009. I have paged Dr. Daleen Squibb to review this with him. I will call the pt back. Follow-up by: Sherri Rad, RN, BSN,  March 29, 2009 8:55 AM  Additional Follow-up for Phone Call Additional follow up Details #1::        I did not get a call back from Dr. Daleen Squibb. I spoke with Elsie Amis. Per Marcelino Duster, the pt should come for an EKG and then we can go from there. I spoke with the pt and he is agreeable with this.  Additional Follow-up by: Sherri Rad, RN, BSN,  March 29, 2009 10:00 AM

## 2010-02-20 NOTE — Assessment & Plan Note (Signed)
Summary: ekg/dfg  Nurse Visit   Vital Signs:  Patient profile:   65 year old male Pulse rate:   82 / minute Pulse rhythm:   irregular BP sitting:   124 / 74  (left arm) Cuff size:   large  Vitals Entered By: Lisabeth Devoid RN (August 21, 2009 11:55 AM)   Allergies: No Known Drug Allergies  Appended Document: ekg/dfg Per Dr. Eden Emms (DOD)  Take Norpace 150mg  three times today. Follow-up with Dr. Daleen Squibb on 08/22/09 Schedule an appointment with EP doctor as soon as possible.   Clinical Lists Changes  Problems: Added new problem of ATRIAL FIBRILLATION (ICD-427.31) Added new problem of ATRIAL FLUTTER (ICD-427.32) Orders: Added new Referral order of EP Referral (Cardiology EP Ref ) - Signed      Appended Document: ekg/dfg  Reviewed Juanito Doom, MD

## 2010-02-20 NOTE — Progress Notes (Signed)
Summary: Ronnie Black wants to be seen today**pls call asap**   Phone Note Call from Patient Call back at Home Phone [phone] Call back at [phone]   Caller: Patient Reason for Call: Talk to Nurse, Talk to Doctor Summary of Call: Ronnie Black is having arrythmia's and wants to be seen today no other symptoms Initial call taken by: Omer Jack,  December 25, 2009 8:23 AM  Follow-up for Phone Call        [facility] on both phones. Mylo Red RN   Additional Follow-up for Phone Call Additional follow up Details #1::        Ronnie Black STILL HAVING ARRYTHMIA PROBLEMS AND WANTS TO BE SEEN TODAY PER JACKIE TALK TO Ronnie Black FIND OUT MORE AND THEN CALL HER TO DISCUSS PUTTING ON DOD SCHEDULE [phone] Omer Jack  December 25, 2009 12:49 PM     Additional Follow-up for Phone Call Additional follow up Details #2::    Ronnie Black calls with recurrent irregular heartbeat according to patient this began on Friday 12/22/09.  He states he took an extra Norpace on Friday and Sunday with "no difference in my heart rate".  His heart rate is in the 80's according to patient.  He also denies, shortness of breath, chest pain, and palpitations.  DOD, Dr. Tenny Craw, reviewed and patient will come in today for EKG.  Ronnie Black aware and will come in around 2:00 pm today. Mylo Red RN   Appended Document: Ronnie Black wants to be seen today**pls call asap**  Reviewed Juanito Doom, MD  Appended Document: Ronnie Black wants to be seen today**pls call asap** I called Ronnie Black this am at [phone].  He remains in afib with sypmtoms of palpitations and fatigue. I have reviewed his EKG from 12/25/09 which confirms afib despite norpace. At this time, I will stop aspirin and start pradaxa 150mg  two times a day. We will have a BMET today to confirm CrCl >50.  He will then continue norpace and proceed with TEE guided cardioversion next week. r/b/a  to TEE guided [facility] were discussed at length by me and he wishes to proceed. I will then see him back 2-3 weeks post [facility] to discuss  catheter ablation as our next option.

## 2010-02-20 NOTE — Letter (Signed)
Summary: Appointment - Reminder 2  [facility], Main Office  [address]   [place], [place] [address]   Phone: [phone]  Fax: [phone]     January 04, 2009 MRN: [id number]   ELLA GUILLOTTE [address] [place], [place]  [address]   Dear Mr. Hicklin,  Our records indicate that it is time to schedule a follow-up appointment. Dr.Wall recommended that you follow up with [name] in Feb,2011. It is very important that we reach you to schedule this appointment. We look forward to participating in your health care needs. Please contact us at the number listed above at your earliest convenience to schedule your appointment.  If you are unable to make an appointment at this time, give [name] a call so we can update our records.     Sincerely,   Glass blower/designer

## 2010-02-21 ENCOUNTER — Ambulatory Visit (HOSPITAL_COMMUNITY)
Admission: RE | Admit: 2010-02-21 | Payer: BC Managed Care – PPO | Source: Ambulatory Visit | Admitting: Cardiovascular Disease

## 2010-02-22 ENCOUNTER — Ambulatory Visit (HOSPITAL_COMMUNITY)
Admission: RE | Admit: 2010-02-22 | Discharge: 2010-02-22 | Disposition: A | Discharge status: Home / Self Care | Payer: BC Managed Care – PPO | Attending: Internal Medicine | Admitting: Internal Medicine

## 2010-02-22 ENCOUNTER — Telehealth: Payer: Self-pay | Admitting: Internal Medicine

## 2010-02-22 DIAGNOSIS — I4891 Unspecified atrial fibrillation: Secondary | ICD-10-CM

## 2010-02-22 DIAGNOSIS — I059 Rheumatic mitral valve disease, unspecified: Secondary | ICD-10-CM | POA: Insufficient documentation

## 2010-02-22 NOTE — Progress Notes (Signed)
Summary: Calling about procedure   Phone Note Call from Patient Call back at Home Phone [phone]   Caller: Patient Summary of Call: Pt calling regarding procedure on 01/04/10 Initial call taken by: Judie Grieve,  January 02, 2010 2:20 PM  Follow-up for Phone Call        pt to come in tomorrow for an EKG.  If not in afib will cx TEE/DCCV  but continue Pradaxa and f/u with Dr Johney Frame in 3 weeks  if still in afib proceed as scheduled Dennis Bast, RN, BSN  January 02, 2010 2:51 PM

## 2010-02-22 NOTE — Assessment & Plan Note (Addendum)
Summary: per check out/sf    Visit Type:  Follow-up Referring Provider:  Dr Daleen Squibb Primary Provider:  Bradd Canary  CC:  C/O FATIGUE.  History of Present Illness: Ronnie Black is a pleasant 65 yo WM with a h/o nonobstructive hypertrophic CM, persistent atrial fibrillation, and atrial flutter who presents today for EP follow-up regarding atrial arrhythmias.  He was initially diagnosed with atrial fibrillation 03/2007 following an URI.  His afib spontaneously terminated and he did well until June 2009.  He was placed on Norpace.  He did well until 3/11 without further afib.  3/11, he reports having a URI and atrial fibrillation which spontaneously terminated.  He reports doing well until Sunday 08/20/09 when he developed palpitations.  He then reports having rapid palpitations.  He reports having fatigue and nervousness.  He Was evaluated 08/21/09 in our office and found to have typical appearing atrial flutter.  He took an extra Norpace and then returned to sinus rhythm.  He has had increasing episodes of atrial fibrillation since that time.  He recently called our office with persisent atrial fibrillation.  We made plans for cardioversioin but he spontaneously returned to sinus before the procedure was performed.  Unfortunatley, several days later, he had returned to atrial fibrillation. He is unaware of any triggers or precipitants for his recent episode.   He denies associated CP or SOB.  He reports fatgue and decreased exercise tolerance during atrial fibrillation.  He is otherwise without complaint today.  Current Medications (verified): 1)  Lipitor 10 Mg Tabs (Atorvastatin Calcium) .... 1 Tab Once Daily 2)  Metoprolol Tartrate 25 Mg Tabs (Metoprolol Tartrate) .... 1 Tab Two Times A Day 3)  Norpace 150 Mg Caps (Disopyramide Phosphate) .... 1 Cap Two Times A Day 4)  Multivitamins   Tabs (Multiple Vitamin) .... 1 Tab Once Daily 5)  Niaspan 1000 Mg Cr-Tabs (Niacin (Antihyperlipidemic)) .... 1 Tab Once  Daily 6)  Fish Oil 1000 Mg Caps (Omega-3 Fatty Acids) .... 1 Cap Once Daily 7)  Loratadine 10 Mg Tabs (Loratadine) .... As Needed 8)  Vitamin D 1000 Unit Tabs (Cholecalciferol) .... Once Daily 9)  Pradaxa 150 Mg Caps (Dabigatran Etexilate Mesylate) .... One By Mouth Bid  Allergies: No Known Drug Allergies  Past History:  Past Medical History: Reviewed history from 08/28/2009 and no changes required. 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 Appendectomy  Family History: Reviewed history from 08/13/2008 and no changes required. Family History of Coronary Artery Disease: Brother s/p CABG His grandfather died at an old age from unclear causes but no clear sudden cardiac death.  There is no history of sudden cardiac death of other family members or known hypertrophic cardiomyopathy.        Social History: Reviewed history from 08/28/2009 and no changes required. Full Time..is a professor of political science at [employer] Lives in [place]. Married  Tobacco Use - No.  Alcohol Use - yes glass of wine or a beer with dinner Drug Use - no  Review of Systems       All systems are reviewed and negative except as listed in the HPI.   Vital Signs:  Patient profile:   65 year old male Height:      66 inches Weight:      158.25 pounds BMI:     25 .63 Pulse  rate:   72 / minute Pulse rhythm:   irregular BP sitting:   130 / 74  (left arm) Cuff size:   regular  Vitals Entered By: Scherrie Bateman, LPN (January 26, 2010 12:36 PM)  Physical Exam  General:  Well developed, well nourished, in no acute distress. Head:  normocephalic and atraumatic Eyes:  PERRLA/EOM intact; conjunctiva and lids normal. Mouth:  Teeth, gums and palate  normal. Oral mucosa normal. Neck:  Neck supple, no JVD. No masses, thyromegaly or abnormal cervical nodes. Lungs:  Clear bilaterally to auscultation and percussion. Heart:  iregular rate and rhythm, no significant murmur. Abdomen:  Bowel sounds positive; abdomen soft and non-tender without masses, organomegaly, or hernias noted. No hepatosplenomegaly. Msk:  Back normal, normal gait. Muscle strength and tone normal. Extremities:  No clubbing or cyanosis. Neurologic:  Alert and oriented x 3. Skin:  Intact without lesions or rashes. Psych:  Normal affect.   Echocardiogram  Procedure date:  03/28/2009  Findings:       Study Conclusions    - Left ventricle: The cavity size was mildly dilated. Wall thickness     was increased in a pattern of severe LVH. Systolic function was     normal. The estimated ejection fraction was in the range of 55% to     60%.   - Mitral valve: Mild regurgitation.   - Left atrium: The atrium was mildly dilated.   - Right atrium: The atrium was mildly to moderately dilated.   - Atrial septum: No defect or patent foramen ovale was identified.   - Impressions: Findings consistant with non-obstructive mid and     apical HOCM. No SAM or LVOT gradient   Impressions:    - Findings consistant with non-obstructive mid and apical HOCM. No     SAM or LVOT gradient   Transthoracic echocardiography. M-mode, complete 2D, spectral   Doppler, and color Doppler. Height: Height: 167.6cm. Height: 66in.   Weight: Weight: 71.2kg. Weight: 156.7lb. Body mass index: BMI:   25.3kg/m^2. Body surface area: BSA: 1.44m^2. Blood pressure: 120/70.   Patient status: Outpatient. Location: [facility]  Carotid Doppler  Procedure date:  08/29/2008  Findings:      Impressions: Normal carotid arteries, bilaterally.  Tonny Bollman, MD    EKG  Procedure date:  01/26/2010  Findings:      atrial fibrillation, incomplete RBBB, LVH with repolarization abnormality  Impression  & Recommendations:  Problem # 1:  ATRIAL FIBRILLATION (ICD-427.31) The patient has both symptomatic persistent atrial fibrillation and typical appearing atrial flutter.  Therapeutic strategies for afib and atrial flutter including medicine and ablation were discussed in detail with the patient today. At this point, our options would be to increase norpace to 300mg  BID, switch to amiodarone, or proceed with ablation.  Risk, benefits, and alternatives to EP study and radiofrequency ablation were also discussed in detail today.  The patient understands the risks and wishes to proceed with ablation. We will therefore initiate pradaxa at this time.  We will proceed with ablation once he has been on pradaxa for at least 3 weeks.  Problem # 2:  HYPERTROPHIC OBSTRUCTIVE CARDIOMYOPATHY (ICD-425.1) stable no changes today prior echo reviewed  Problem # 3:  HYPERLIPIDEMIA-MIXED (ICD-272.4) stable His updated medication list for this problem includes:    Lipitor 10 Mg Tabs (Atorvastatin calcium) .Marland Kitchen... 1 tab once daily    Niaspan 1000 Mg Cr-tabs (Niacin (antihyperlipidemic)) .Marland Kitchen... 1 tab once daily  Patient Instructions: 1)  Your physician  recommends that you continue on your current medications as directed. Please refer to the Current Medication list given to you today. 2)  Your physician has recommended that you have an ablation.  Catheter ablation is a medical procedure used to treat some cardiac arrhythmias (irregular heartbeats). During catheter ablation, a long, thin, flexible tube is put into a blood vessel in your groin (upper thigh), or neck. This tube is called an ablation catheter. It is then guided to your heart through the blood vessel. Radiofrequency waves destroy small areas of heart tissue where abnormal heartbeats may cause an arrhythmia to start.  Please see the instruction sheet given to you today.

## 2010-02-22 NOTE — Consult Note (Signed)
  NAMEABDULKARIM, Black              ACCOUNT NO.:  [id number]  MEDICAL RECORD NO.:  [id number]           PATIENT TYPE:  O  LOCATION:  [facility]                         FACILITY:  [facility]  PHYSICIAN:  Favio Moder C. Eden Emms, MD, FACCDATE OF BIRTH:  1945/09/09  DATE OF CONSULTATION: DATE OF DISCHARGE:                                TEE   A 65 year old patient of Dr. Johney Frame scheduled for atrial fibrillation ablation tomorrow.  Transesophageal echo was performed to rule out left atrial appendage clot.  The patient has been on Pradaxa for 5-6 weeks. The patient was sedated with 4 mg of Versed and 25 mcg of fentanyl.  Using digital technique, an Omniplane probe was advanced in distal esophagus without incident.  The LV cavity size was small and spade-like.  There was significant mid and apical hypertrophy.  The most basal septum only measured 12 mm below the aortic valve cusps, however, within half a centimeter the ventricles increased in thickness to 2 cm.There is no LVOT gradient or SAM.  Mitral valve was mildly thickened with trivial MR.  Left atrium and right-sided cardiac chambers were normal.  There was no atrial septal defect or PFO.  Left atrial appendage was well visualized in orthogonal views.  There is no left atrial appendage clot, thrombus or spontaneous contrast.  The left upper pulmonary vein was well visualized, then measured 1.8 cm at the ostium.  The left lower pulmonary vein measured approximately 1.5 cm at the ostium.  The right upper pulmonary vein measured approximately 1 cm and the right lower pulmonary vein was not visualized.  Imaging of the tricuspid valve showed it to be normal and trileaflet. Imaging of the aortic valve showed it to be normal and trileaflet. Imaging of the tricuspid and pulmonary valves was normal.  Imaging of aorta showed no debris.  FINAL IMPRESSION: 1. No left atrial appendage clot or thrombus. 2. Mid to apical hypertrophic cardiomyopathy with no  left ventricular     outflow tract gradient ejection fraction 60-65%, a septal thickness     2 cm. 3. Normal mitral valve with trivial mitral regurgitation. 4. Normal atrial size. 5. Normal right ventricle. 6. No aortic debris. 7. No atrial septal defect or patent foramen ovale.  The patient tolerated the procedure well and he knows to be here tomorrow for his atrial fibrillation ablation with Dr. Johney Frame.     [name]. Eden Emms, MD, [facility]     PCN/MEDQ  D:  02/21/2010  T:  02/22/2010  Job:  [id number]  Electronically Signed by Charlton Haws MD [facility] on 02/22/2010 10:39:11 PM

## 2010-02-22 NOTE — Letter (Signed)
Summary: ELectrophysiology/Ablation Procedure Instructions  [employer], Main Office  [address]   [place], [place] [address]   Phone: [phone]  Fax: [phone]     Electrophysiology/Ablation Procedure Instructions    You are scheduled for a(n) A-Fib Ablation on February 22, 2010 at 7:30 am with Dr. Johney Frame.   1.  Please come to the Short Stay Center at [facility] at 5:30 am on the day of your procedure.  2.  Come prepared to stay overnight.   Please bring your insurance cards and a list of your medications.  3.  Come to the [place] office on February 15, 2010 at 2:00 for lab work.  The lab at [facility] is open from 8:30 AM to 1:30 PM and 2:30 PM to 5:00 PM.  The lab at [facility] is open from 7:30 AM to 5:30 PM.  You do not have to be fasting.  4.  Do not have anything to eat or drink after midnight the night before your procedure.  5.  Do NOT take Pradaxa the morning of your procedure. All of your remaining medications may be taken with a small amount of water.  6.  Educational material received:  _____ EP   ____x_ Ablation   * Occasionally, EP studies and ablations can become lengthy.  Please make your family aware of this before your procedure starts.  Average time ranges from 2-8 hours for EP studies/ablations.  Your physician will locate your family after the procedure with the results.  Dennis Bast, RN  * If you have any questions after you get home, please call the office at [phone].  TEE on 2/1 at 2:30pm with Dr. Eden Emms. Arrive at short stay at 1:30pm.  You may have a clear liquid breakfast until 8:30am.  Clear liquids include water, broth, Sprite, Ginger Ale, coffee, tea (no sugar), cranberry/apple/grape juice, and popsicle from clear juices.  You will need a driver after the procedure.

## 2010-02-22 NOTE — Progress Notes (Signed)
Summary: pt req nurse call   Phone Note Call from Patient   Caller: Patient [phone] Reason for Call: Talk to Nurse Summary of Call: pt calling re arrythmias again Initial call taken by: Glynda Jaeger,  January 08, 2010 9:17 AM  Follow-up for Phone Call        Out of rhythm again over the weekend the HR is irreg and in the 80's he says he feels fine when he's working and doesn't notice it at all hurt back on the weekend-?mobic w/ Pradaxa -short term use? discussed with Weston Brass, Pharm D  Ok for short term use.  Increased risk for GI bleeding  Pt aware.  Look for signs of GI bleeding.   Dennis Bast, RN, BSN  January 08, 2010 12:28 PM

## 2010-02-23 ENCOUNTER — Telehealth: Payer: Self-pay | Admitting: Internal Medicine

## 2010-02-28 NOTE — Progress Notes (Signed)
Summary: pt rs hospital procedure   Phone Note Call from Patient   Caller: Patient [phone] Reason for Call: Talk to Nurse Summary of Call: pt calling re rs a hospital procedure Initial call taken by: Glynda Jaeger,  February 22, 2010 11:30 AM  Follow-up for Phone Call        Pt calliing about rescheduled ablation.  He is aware Tresa Endo will be in tomorrow.  He will await call from [facility] on his Cell # [phone] Follow-up by: Lisabeth Devoid RN,  February 22, 2010 12:17 PM

## 2010-03-12 ENCOUNTER — Encounter: Payer: Self-pay | Admitting: Internal Medicine

## 2010-03-20 ENCOUNTER — Other Ambulatory Visit: Payer: BC Managed Care – PPO

## 2010-03-20 ENCOUNTER — Other Ambulatory Visit: Payer: Self-pay | Admitting: Internal Medicine

## 2010-03-20 ENCOUNTER — Encounter (INDEPENDENT_AMBULATORY_CARE_PROVIDER_SITE_OTHER): Payer: Self-pay | Admitting: *Deleted

## 2010-03-20 DIAGNOSIS — I4891 Unspecified atrial fibrillation: Secondary | ICD-10-CM

## 2010-03-20 LAB — BASIC METABOLIC PANEL
BUN: 13 mg/dL (ref 6–23)
CO2: 32 mEq/L (ref 19–32)
Calcium: 9.7 mg/dL (ref 8.4–10.5)
Chloride: 99 mEq/L (ref 96–112)
Creatinine, Ser: 1.1 mg/dL (ref 0.4–1.5)
GFR: 72.53 mL/min (ref >60.00)
Glucose, Bld: 90 mg/dL (ref 70–99)
Potassium: 4.6 mEq/L (ref 3.5–5.1)
Sodium: 138 mEq/L (ref 135–145)

## 2010-03-20 LAB — CBC WITH DIFFERENTIAL/PLATELET
Basophils Absolute: 0 10*3/uL (ref 0.0–0.1)
Basophils Relative: 0.3 % (ref 0.0–3.0)
Eosinophils Absolute: 0.2 10*3/uL (ref 0.0–0.7)
Eosinophils Relative: 3.1 % (ref 0.0–5.0)
HCT: 48.6 % (ref 39.0–52.0)
Hemoglobin: 16.7 g/dL (ref 13.0–17.0)
Lymphocytes Relative: 35.8 % (ref 12.0–46.0)
Lymphs Abs: 1.8 10*3/uL (ref 0.7–4.0)
MCHC: 34.5 g/dL (ref 30.0–36.0)
MCV: 90.3 fl (ref 78.0–100.0)
Monocytes Absolute: 0.3 10*3/uL (ref 0.1–1.0)
Monocytes Relative: 6.3 % (ref 3.0–12.0)
Neutro Abs: 2.7 10*3/uL (ref 1.4–7.7)
Neutrophils Relative %: 54.5 % (ref 43.0–77.0)
Platelets: 182 10*3/uL (ref 150.0–400.0)
RBC: 5.38 Mil/uL (ref 4.22–5.81)
RDW: 12.6 % (ref 11.5–14.6)
WBC: 5 10*3/uL (ref 4.5–10.5)

## 2010-03-20 NOTE — Progress Notes (Signed)
Summary: ablation   Phone Note Call from Patient Call back at [phone]   Caller: Patient Reason for Call: Talk to Nurse Summary of Call: pt calling to reschedule his hospital procedure. Initial call taken by: Roe Coombs,  February 23, 2010 9:17 AM  Follow-up for Phone Call        spoke with pt he is going tp reschedule for 03/27/10 and will have his labs done at th e [place] office on 03/20/10 will schedule and mail copy of instructions to patient Dennis Bast, RN, BSN  February 23, 2010 9:38 AM  Pt calling back regarding ablation Judie Grieve  March 06, 2010 2:28 PM

## 2010-03-20 NOTE — Letter (Signed)
Summary: ELectrophysiology/Ablation Procedure Instructions  [employer] [facility], Main Office  [address]   [place], [place] [address]   Phone: [phone]  Fax: [phone]     Electrophysiology/Ablation Procedure Instructions    You are scheduled for a(n) afib ablation on 03/27/10 at 7:30am with Dr. Johney Frame.  1.  Please come to the Short Stay Center at [facility] at 5:30am on the day of your procedure.  2.  Come prepared to stay overnight.   Please bring your insurance cards and a list of your medications.  3.  Come to the [employer] office on 03/20/10  for lab work.  .  You do not have to be fasting.  4.  Do not have anything to eat or drink after midnight the night before your procedure.  5.  Do NOT take these medications for the morning of your procedure unless otherwise instructed:  PRADAXA.  All of your remaining medications may be taken with a small amount of water.  6.  Educational material received:  Ablation   * Occasionally, EP studies and ablations can become lengthy.  Please make your family aware of this before your procedure starts.  Average time ranges from 2-8 hours for EP studies/ablations.  Your physician will locate your family after the procedure with the results.  * If you have any questions after you get home, please call the office at [phone]. Ronnie Black  TEE--will be done sameday as ablation at 7:30am

## 2010-03-27 ENCOUNTER — Ambulatory Visit (HOSPITAL_COMMUNITY)
Admission: RE | Admit: 2010-03-27 | Discharge: 2010-03-28 | Disposition: A | Discharge status: Home / Self Care | Payer: BC Managed Care – PPO | Source: Ambulatory Visit | Attending: Internal Medicine | Admitting: Internal Medicine

## 2010-03-27 DIAGNOSIS — I517 Cardiomegaly: Secondary | ICD-10-CM | POA: Insufficient documentation

## 2010-03-27 DIAGNOSIS — I4892 Unspecified atrial flutter: Secondary | ICD-10-CM | POA: Insufficient documentation

## 2010-03-27 DIAGNOSIS — I428 Other cardiomyopathies: Secondary | ICD-10-CM | POA: Insufficient documentation

## 2010-03-27 DIAGNOSIS — I4891 Unspecified atrial fibrillation: Secondary | ICD-10-CM

## 2010-03-27 DIAGNOSIS — E785 Hyperlipidemia, unspecified: Secondary | ICD-10-CM | POA: Insufficient documentation

## 2010-03-27 DIAGNOSIS — IMO0002 Reserved for concepts with insufficient information to code with codable children: Secondary | ICD-10-CM | POA: Insufficient documentation

## 2010-03-27 HISTORY — PX: OTHER SURGICAL HISTORY: SHX169

## 2010-03-27 LAB — MRSA PCR SCREENING: MRSA by PCR: NEGATIVE

## 2010-03-28 LAB — POCT ACTIVATED CLOTTING TIME
Activated Clotting Time: 299 seconds
Activated Clotting Time: 317 seconds
Activated Clotting Time: 317 seconds
Activated Clotting Time: 311 seconds
Activated Clotting Time: 317 seconds
Activated Clotting Time: 152 seconds

## 2010-04-02 ENCOUNTER — Telehealth: Payer: Self-pay | Admitting: Internal Medicine

## 2010-04-03 ENCOUNTER — Encounter: Payer: Self-pay | Admitting: Internal Medicine

## 2010-04-06 NOTE — Op Note (Signed)
Ronnie, Black NO.:  [id number]  MEDICAL RECORD NO.:  [id number]           PATIENT TYPE:  I  LOCATION:  2915                         FACILITY:  [facility]  PHYSICIAN:  Hillis Range, MD       DATE OF BIRTH:  [birth year]  DATE OF PROCEDURE: DATE OF DISCHARGE:                              OPERATIVE REPORT   SURGEON:  Hillis Range, MD.  PREPROCEDURE DIAGNOSES: 1. Persistent atrial fibrillation. 2. Typical-appearing atrial flutter.  POSTPROCEDURE DIAGNOSES: 1. Persistent atrial fibrillation. 2. Typical appearing atrial flutter.  PROCEDURES: 1. Comprehensive EP study. 2. Coronary sinus pacing and recording. 3. A 3-D mapping of SVT. 4. Radiofrequency ablation of SVT. 5. Arterial blood pressure monitoring. 6. Intracardiac echocardiography. 7. Transseptal puncture of an intact septum. 8. Pulmonary vein venography. 9. Cardioversion.  INTRODUCTION:  Ronnie Black is a pleasant 65 year old gentleman with a history of persistent atrial fibrillation and nonobstructive hypertrophic cardiomyopathy who presents for EP study and radiofrequency ablation.  He was initially diagnosed with atrial fibrillation in 2009. He subsequently has failed medical therapy with Norpace and metoprolol. He has also been observed to have typical-appearing atrial flutter.  He therefore presents today for EP study and radiofrequency ablation.  DESCRIPTION OF PROCEDURE:  Informed written consent was obtained and the patient was brought to the electrophysiology lab in the fasting state. He was adequately sedated with intravenous medications as outlined in the anesthesia report.  A transesophageal echocardiography was performed by Dr. Nicholes Mango as reported separately, which revealed left atrial appendage thrombus.  The patient was then prepped and draped in the usual sterile fashion by the EP lab staff.  Using a percutaneous Seldinger technique, two 7-French and one 8-French hemostasis  sheaths were placed in the right common femoral vein.  A 4-French hemostasis sheath was placed in the right common femoral artery for blood pressure monitoring.  An 11-French hemostasis sheath was placed in the left common femoral vein.  A 7-French Biosense Webster decapolar coronary sinus catheter was introduced through the right common femoral vein and advanced into the coronary sinus for recording and pacing from this location.  A 6-French quadripolar Josephson catheter was introduced through the right common femoral vein and advanced into the right ventricle for recording and pacing.  This catheter was then pulled back to the His bundle location.  The patient presented to the electrophysiology lab in atrial fibrillation.  His QRS duration measured 110 milliseconds with a QT interval of 476 milliseconds.  His HV interval measured 50 milliseconds.  A 10-French Biosense Webster AcuNav intracardiac echocardiography catheter was introduced through the left common femoral vein and advanced into the right atrium.  Intracardiac echocardiography was performed which demonstrated a moderately enlarged left atrium.  The patient was noted to have 4 separate pulmonary veins. The pulmonary veins were moderate in size, though the left superior pulmonary vein was noted to be quite small.  There was no evidence of pulmonary vein stenosis.  The middle right common femoral vein sheath was then exchanged for an 8.5 [place] SL2 transseptal sheath and transseptal access was achieved in a standard fashion using a Brockenbrough needle under  biplane fluoroscopy with intracardiac echocardiography confirmation of the transseptal puncture.  Once transseptal access had been achieved, heparin was administered intravenously and intra-arterially in order to maintain an ACT target of 350 seconds throughout the procedure.  A 6-French multipurpose angiographic catheter with guidewire was introduced through  the transseptal sheath and positioned over the mouth of all 4 pulmonary veins.  Pulmonary venograms were performed by hand injection of nonionic contrast and demonstrated moderate-sized pulmonary veins with a small left superior pulmonary vein.  There was no evidence of pulmonary vein stenosis.  The angiographic catheter was then removed.  The His bundle catheter was removed and in its place a 3.5-mm Biosense Webster EZ-Steer ThermaCool ablation catheter was advanced into the right atrium.  The transseptal sheath was pulled back into the IVC over a guidewire.  The ablation catheter was advanced across the transseptal hole using the wire as a guide.  The transseptal sheath was then re-advanced over the guidewire into the left atrium.  A 28-mm 20-pole circular mapping catheter was introduced through the transseptal sheath and positioned over the mouth of all 4 pulmonary veins.  Three-dimensional electroanatomical mapping was performed using [employer].  This demonstrated electrical activity within all 4 pulmonary veins at baseline.  The patient underwent successful sequential electrical isolation and anatomical encircling of all 4 pulmonary veins using radiofrequency current with a circular mapping catheter as a guide. Complex fractionated atrial electrograms were then identified and ablated along the roof of the left atrium, at the base of the left atrial appendage, along the lateral wall of the left atrium, and above the coronary sinus along the mitral valve annulus.  Additional CFAEs were identified and ablated along the posterior wall as well as the interatrial septum.  The patient was then successfully cardioverted to sinus rhythm with a single 360 joules synchronized biphasic shock delivered with cardioversion electrodes in the anterior-posterior thoracic configuration.  During sinus rhythm, pulmonary vein isolation was confirmed.  The ablation catheter was then pulled back into  the right atrium and positioned along the cavotricuspid isthmus.  A series of radiofrequency applications were delivered between the tricuspid valve annulus and the inferior vena cava along the usual cavotricuspid isthmus.  The patient had a very long and anteriorly displaced cavotricuspid isthmus.  I therefore exchanged the SL2 sheath for ramp sheath to accommodate ablation along the cavotricuspid isthmus. Differential atrial pacing was performed from the low lateral right atrium, which confirmed a stimulus to earliest atrial activation of 140 milliseconds bidirectional across the isthmus.  Ventricular pacing was performed which revealed VA dissociation.  Following ablation, the AH interval measured 99 milliseconds with an HV interval of 53 milliseconds.  The AV Wenckebach cycle length was less than 600 milliseconds.  The procedure was therefore considered completed.  All catheters were removed and the sheaths were aspirated and flushed.  The patient was transferred to the recovery area for sheath removal per protocol.  A limited bedside transthoracic echocardiogram revealed no pericardial effusion.  There were no early apparent complications.  CONCLUSIONS: 1. Persistent atrial fibrillation and a history of typical atrial     flutter. 2. Pulmonary vein isolation was performed. 3. Complex fractionated atrial electrograms were identified and     successfully ablated along the roof of the left atrium, at the base     of the left atrial appendage, along the lateral wall of the left     atrium, above the coronary sinus and along the mitral valve     annulus, and  along the interatrial septum. 4. Successful cardioversion to sinus rhythm. 5. Cavotricuspid isthmus ablation performed. 6. No early apparent complications.     Hillis Range, MD     JA/MEDQ  D:  03/27/2010  T:  03/28/2010  Job:  [id number]  cc:   Quita Skye. Artis Flock, M.D. Dr. Daleen Squibb  Electronically Signed by Hillis Range MD on  04/06/2010 10:56:37 PM

## 2010-04-06 NOTE — Discharge Summary (Signed)
Ronnie Black, LEIDER NO.:  [id number]  MEDICAL RECORD NO.:  [id number]           PATIENT TYPE:  I  LOCATION:  2915                         FACILITY:  [facility]  PHYSICIAN:  Hillis Range, MD       DATE OF BIRTH:  01-04-1946  DATE OF ADMISSION:  03/27/2010 DATE OF DISCHARGE:  03/28/2010                              DISCHARGE SUMMARY   PRIMARY CARE PHYSICIAN:  Quita Skye. Artis Flock, MD.  PRIMARY CARDIOLOGIST:  Jesse Sans. Wall, MD, [facility].  ELECTROPHYSIOLOGIST:  Hillis Range, MD  PRIMARY DIAGNOSIS:  Atrial fibrillation.  SECONDARY DIAGNOSES: 1. Hyperlipidemia. 2. Degenerative disk disease. 3. 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  ALLERGIES:  The patient has no known drug allergies.  PROCEDURES IN THIS ADMISSION:  Electrophysiology study and radiofrequency catheter ablation of atrial fibrillation, atrial flutter by Dr. Johney Frame on March 27, 2010.  This study demonstrated persistent atrial fibrillation upon presentation.  Pulmonary vein isolation was performed.  Complex fractionated atrial electrograms were also identified and successfully ablated.  The patient also underwent cavotricuspid isthmus ablation and successful cardioversion to sinus rhythm.  The patient had no early apparent complications.  HISTORY OF PRESENT ILLNESS:  Ronnie Black is a 65 year old male with a history of nonobstructive hypertrophic cardiomyopathy, persistent atrial fibrillation, and atrial flutter.  He was referred to Dr. Johney Frame in the outpatient setting for treatment options for his atrial arrhythmias.  He was initially diagnosed with atrial fibrillation in March 2009.  At that time, it terminated spontaneously and he did well until June 2009.  At that point, he was placed on Norpace and maintained in sinus rhythm until March 2011 without further atrial fibrillation.  He then had intermittent recurrent atrial fibrillation.  Because of his  recurrent symptomatic atrial fibrillation despite antiarrhythmic therapy, he was referred for possible ablation of atrial fibrillation.  Risks, benefits, and alternatives of this procedure were discussed with the patient.  He wished to proceed.  The patient was initiated on Pradaxa and maintained this medication for 3 weeks preprocedure.  HOSPITAL COURSE:  The patient was admitted on March 27, 2010, for planned ablation of atrial fibrillation.  He underwent transesophageal echocardiogram preprocedure by Dr. Gala Romney.  This demonstrated a left ventricular ejection fraction of 50-55% with no clot in the left atrial appendage.  The patient then underwent ablation of atrial fibrillation and atrial flutter by Dr. Johney Frame with details as outlined above.  He was monitored on telemetry overnight, which demonstrated sinus rhythm.  His groin incisions were without hematoma or bruit.  Dr. Johney Frame examined the patient on March 28, 2010, and considered him stable for discharge.  FOLLOWUP APPOINTMENTS: 1. Dr. Johney Frame, July 09, 2010, at 11:15 a.m. 2. Dr. Daleen Squibb as scheduled. 3. Dr. Artis Flock as scheduled.  DISCHARGE INSTRUCTIONS: 1. Increase activity slowly. 2. Follow low-sodium, heart-healthy diet. 3. No driving for 4 days. 4. Keep groin incisions clean and dry.  DISCHARGE MEDICATIONS: 1. Prilosec 20 mg 1 tablet daily - this is a new prescription for the     patient, should be maintained for 4 weeks. 2. Lipitor 10 mg daily. 3. Loratadine  10 mg daily. 4. Metoprolol tartrate 25 mg twice daily. 5. Niaspan 1000 mg daily. 6. Norpace CR 150 mg twice daily. 7. Pradaxa 150 mg twice daily. 8. Lactate over the counter 1 tablet daily as needed.  DISPOSITION:  The patient was seen and seen by Dr. Johney Frame on March 28, 2010, and considered stable for discharge.  DURATION OF DISCHARGE ENCOUNTER:  Thirty-five minutes.     Gypsy Balsam, RN,BSN   ______________________________ Hillis Range, MD    AS/MEDQ   D:  03/28/2010  T:  03/28/2010  Job:  [id number]  cc:   Quita Skye. Artis Flock, M.D. Thomas C. Daleen Squibb, MD, [facility]  Electronically Signed by Gypsy Balsam RNBSN on 04/04/2010 11:27:44 AM Electronically Signed by Hillis Range MD on 04/06/2010 10:56:40 PM

## 2010-04-10 NOTE — Progress Notes (Signed)
Summary: Pt request call/2nd call   Phone Note Call from Patient Call back at [phone]   Reason for Call: Talk to Nurse Summary of Call: Request call Initial call taken by: Judie Grieve,  April 02, 2010 11:06 AM  Follow-up for Phone Call        spoke with pt he is having a few problems wants me to call him back in 10 mon he was getting ready to start a class Dennis Bast, RN, BSN  April 02, 2010 1:06 PM  Pt returning call [phone] Judie Grieve  April 02, 2010 1:58 PM Pt feels like he is more irreg than before the ablation HR is in the 90's resting.  But just can't tell if it's PAC's or afib.. It feels more fatigued than prior to the ablation.  Went back to work today. Pt says he can come in for an EKG tomorrow.  Please advise Dennis Bast, RN, BSN  April 02, 2010 4:40 PM  Additional Follow-up for Phone Call Additional follow up Details #1::        pt calling back. pt would like to talk to kelly. Additional Follow-up by: Roe Coombs,  April 03, 2010 1:21 PM    Additional Follow-up for Phone Call Additional follow up Details #2::    called and spoke with pt He is going to caome in for an EKG Dennis Bast, RN, BSN  April 03, 2010 4:06 PM Dr Johney Frame looked at EKG  and wanted me to call pt and let him know he wants to give his heart a couple more weeks to continue to heal before he proceeds with DCCV.  Pt aware  He requested a handicap parking sticker.  Will fill out and have out front for patient Dennis Bast, RN, BSN  April 04, 2010 6:44 PM

## 2010-04-17 ENCOUNTER — Encounter: Payer: Self-pay | Admitting: Internal Medicine

## 2010-04-17 ENCOUNTER — Telehealth: Payer: Self-pay | Admitting: Internal Medicine

## 2010-04-17 DIAGNOSIS — I4891 Unspecified atrial fibrillation: Secondary | ICD-10-CM

## 2010-04-17 NOTE — Telephone Encounter (Signed)
He can't tell if it's afib or PVC's   He will come in today for EKG

## 2010-04-17 NOTE — Telephone Encounter (Signed)
Returned pt's call.  Left him a message to return my call

## 2010-04-19 NOTE — Telephone Encounter (Signed)
Left message for patient to call me back to schedule DCCV per Dr Johney Frame

## 2010-04-20 ENCOUNTER — Other Ambulatory Visit (INDEPENDENT_AMBULATORY_CARE_PROVIDER_SITE_OTHER): Payer: BC Managed Care – PPO | Admitting: *Deleted

## 2010-04-20 ENCOUNTER — Other Ambulatory Visit: Payer: Self-pay | Admitting: Internal Medicine

## 2010-04-20 ENCOUNTER — Encounter: Payer: Self-pay | Admitting: *Deleted

## 2010-04-20 DIAGNOSIS — R0789 Other chest pain: Secondary | ICD-10-CM

## 2010-04-20 NOTE — Telephone Encounter (Signed)
Have set pt up for DCCV for Tues 04/24/10  At 12:00 He is to check in at 10:00am at Short Stay Nothing to eat or drink after midnight  Come in today for labs BMP CBC and MAG

## 2010-04-20 NOTE — Telephone Encounter (Signed)
LOV x3, Ablation faxed to Dr.Jessica [facility] @ [facility] @ [phone] 04/20/10/KM

## 2010-04-21 LAB — CBC WITH DIFFERENTIAL/PLATELET
Basophils Absolute: 0 10*3/uL (ref 0.0–0.1)
Basophils Relative: 1 % (ref 0–1)
Eosinophils Absolute: 0.2 10*3/uL (ref 0.0–0.7)
Eosinophils Relative: 3 % (ref 0–5)
HCT: 45.6 % (ref 39.0–52.0)
Hemoglobin: 15.3 g/dL (ref 13.0–17.0)
Lymphocytes Relative: 45 % (ref 12–46)
Lymphs Abs: 2.4 10*3/uL (ref 0.7–4.0)
MCH: 30.1 pg (ref 26.0–34.0)
MCHC: 33.6 g/dL (ref 30.0–36.0)
MCV: 89.8 fL (ref 78.0–100.0)
Monocytes Absolute: 0.4 10*3/uL (ref 0.1–1.0)
Monocytes Relative: 7 % (ref 3–12)
Neutro Abs: 2.4 10*3/uL (ref 1.7–7.7)
Neutrophils Relative %: 45 % (ref 43–77)
Platelets: 213 10*3/uL (ref 150–400)
RBC: 5.08 MIL/uL (ref 4.22–5.81)
RDW: 12.7 % (ref 11.5–15.5)
WBC: 5.3 10*3/uL (ref 4.0–10.5)

## 2010-04-21 LAB — BASIC METABOLIC PANEL
BUN: 16 mg/dL (ref 6–23)
CO2: 25 mEq/L (ref 19–32)
Calcium: 9.4 mg/dL (ref 8.4–10.5)
Chloride: 102 mEq/L (ref 96–112)
Creat: 0.84 mg/dL (ref 0.40–1.50)
Glucose, Bld: 74 mg/dL (ref 70–99)
Potassium: 4.6 mEq/L (ref 3.5–5.3)
Sodium: 140 mEq/L (ref 135–145)

## 2010-04-21 LAB — MAGNESIUM: Magnesium: 2 mg/dL (ref 1.5–2.5)

## 2010-04-24 ENCOUNTER — Ambulatory Visit (HOSPITAL_COMMUNITY)
Admission: RE | Admit: 2010-04-24 | Discharge: 2010-04-24 | Disposition: A | Discharge status: Home / Self Care | Payer: BC Managed Care – PPO | Source: Ambulatory Visit | Attending: Cardiology | Admitting: Cardiology

## 2010-04-24 DIAGNOSIS — I4892 Unspecified atrial flutter: Secondary | ICD-10-CM | POA: Insufficient documentation

## 2010-04-24 DIAGNOSIS — Z7901 Long term (current) use of anticoagulants: Secondary | ICD-10-CM | POA: Insufficient documentation

## 2010-04-26 NOTE — Op Note (Signed)
  Ronnie Black, OSMENT NO.:  [id number]  MEDICAL RECORD NO.:  [id number]           PATIENT TYPE:  O  LOCATION:  [facility]                         FACILITY:  [facility]  PHYSICIAN:  Cassell Clement, M.D. DATE OF BIRTH:  March 22, 1945  DATE OF PROCEDURE:  04/24/2010 DATE OF DISCHARGE:  04/24/2010                              OPERATIVE REPORT   PROCEDURE:  Direct current cardioversion.  HISTORY:  This 65 year old gentleman comes to the [facility] for elective cardioversion.  He underwent ablation of atrial flutter about a month ago.  He has been on Pradaxa twice a day for adequate anticoagulation.  After suitable anesthesia had been given by Anesthesiology using propofol 150 mg intravenously, the patient was given a 120-joule shock using the AP pads synchronized and converted promptly to normal sinus rhythm.  The patient tolerated the procedure well.  There were no postanesthetic complications.          ______________________________ Cassell Clement, M.D.     TB/MEDQ  D:  04/24/2010  T:  04/25/2010  Job:  [id number]  cc:   Hillis Range, MD Jesse Sans. Daleen Squibb, MD, [facility]  Electronically Signed by Cassell Clement M.D. on 04/26/2010 12:27:50 PM

## 2010-05-01 ENCOUNTER — Telehealth: Payer: Self-pay | Admitting: Internal Medicine

## 2010-05-01 NOTE — Telephone Encounter (Signed)
Called patient back-back in Atrial Fib. Since Cardioversion last week.  Was in NSR for a few days then went back in a.fib. Over the weekend.  Wants to know next step.

## 2010-05-07 ENCOUNTER — Other Ambulatory Visit: Payer: Self-pay | Admitting: Internal Medicine

## 2010-05-08 ENCOUNTER — Telehealth: Payer: Self-pay | Admitting: Internal Medicine

## 2010-05-08 NOTE — Telephone Encounter (Signed)
Pt states he in afib. Pt would like to talk to a nurse.

## 2010-05-08 NOTE — Telephone Encounter (Signed)
Patient states he called last Monday to let Dr. Johney Frame know that he is in A-fib again, rate 80 beats/min. Patient had an ablation in March and a Cardioversion 2 weeks ago  Pt. States he left a message for  MD and his nurse, and to be call regarding this issue, but  no one has called him . Patient would like to know if he needs Cardioversion again. He would like to be called back soon.

## 2010-05-10 ENCOUNTER — Telehealth: Payer: Self-pay | Admitting: Internal Medicine

## 2010-05-10 NOTE — Telephone Encounter (Signed)
The patient continues to have afib.  I think that we should place him on amiodarone in the short term to try to maintain sinus rhythm and promote atrial remodeling. R/B/A to amiodarone therapy were discussed at length by me with the patient today by phone.  He understands the risks and wishes to try amiodarone. We will therefore stop norpace today.  After a 72 hour washout, he will start amiodarone 400mg  BID.  I will see him in 3 weeks.  IF he remains in afib at that time, we will proceed to another cardioversion.

## 2010-05-10 NOTE — Telephone Encounter (Signed)
Per Dr Johney Frame wants pt to d/c Norpace and start Amiodarone 400mg  twice daily and follow up with him in 4 weeks.  Spoke with patient he wants to talk with Dr Johney Frame prior to starting the medication.  Will forward message to Dr Johney Frame to call patient

## 2010-05-11 ENCOUNTER — Other Ambulatory Visit: Payer: Self-pay | Admitting: *Deleted

## 2010-05-11 ENCOUNTER — Telehealth: Payer: Self-pay | Admitting: Internal Medicine

## 2010-05-11 DIAGNOSIS — I4891 Unspecified atrial fibrillation: Secondary | ICD-10-CM

## 2010-05-11 MED ORDER — AMIODARONE HCL 200 MG PO TABS: ORAL_TABLET | ORAL | Status: DC

## 2010-05-11 NOTE — Telephone Encounter (Signed)
Spoke with Ronnie Black Norpace is being d/c'd

## 2010-05-11 NOTE — Telephone Encounter (Signed)
Eliot Ford the pharmacist has question re pt meds. Needs to talk to a nurse. # [id number] option 8 then 1

## 2010-05-14 ENCOUNTER — Telehealth: Payer: Self-pay | Admitting: Internal Medicine

## 2010-05-14 NOTE — Telephone Encounter (Signed)
Gena  from [facility] has question re pt meds would like to talk to a nurse. [facility]#[phone]

## 2010-05-14 NOTE — Telephone Encounter (Signed)
Picked up the Amiodarone over the weekend and read the SE with Metoprolol and Lipitor and wants to know if he should be taking this medication  Spoke with Dr Johney Frame he says to proceed if he wants to try and restore NSR.  Gena aware

## 2010-06-04 ENCOUNTER — Encounter: Payer: Self-pay | Admitting: Internal Medicine

## 2010-06-05 ENCOUNTER — Other Ambulatory Visit: Payer: Self-pay | Admitting: Cardiology

## 2010-06-05 NOTE — Consult Note (Signed)
NAMERAGAN, REALE NO.:  [id number]   MEDICAL RECORD NO.:  [id number]          PATIENT TYPE:  EMS   LOCATION:  [facility]                         FACILITY:  [facility]   PHYSICIAN:  Bevelyn Buckles. Bensimhon, MDDATE OF BIRTH:  [birth year]   DATE OF CONSULTATION:  07/03/2007  DATE OF DISCHARGE:  07/03/2007                                 CONSULTATION   PRIMARY CARE PHYSICIAN:  Dr. Luanna Salk.   CARDIOLOGIST:  Dr. Juanito Doom.   REFERRING PHYSICIAN:  Dr. Quenton Fetter.   REASON FOR CONSULTATION:  Atrial flutter.   HISTORY OF PRESENT ILLNESS:  Ronnie Black is a very pleasant 65-year-  old male with a history of asymmetric septal hypertrophy without any  significant outflow tract obstruction.  He also has a history of  hypercholesterolemia.  He denies any history of hypertension, diabetes  or previous stroke.   Back in April of this year he had a single episode of atrial  fibrillation; this was short and self-limited.  He was followed by Dr.  Daleen Squibb.   Yesterday he felt sort of jittery all day.  He took his pulse and  noticed it was occasionally irregular.  He found to be in atrial  flutter.  He saw Dr. Artis Flock, who contacted Dr. Daleen Squibb.  He was started on  Norpace 100 mg b.i.d.  He took his first dose last night and the second  dose this morning.  Throughout today he once again felt just a little  funny at work all day and tonight while walking up some steps, noticed  his heart rate was racing, so he came to the emergency room.  Initial  EKG showed atrial flutter.  Ventricular response was relatively is  controlled in the 70-90 range.  He then converted back to sinus rhythm.  He did have some paroxysmal episodes while in the ER.   REVIEW OF SYSTEMS:  He denies any chest pain.  No shortness of breath.  No syncope, no presyncope.  He is very active.  He runs and does the  elliptical trainer and weights without any difficulty.  The remainder of  the review of systems is negative  except for the HPI problem list.   PROBLEM:  1. Asymmetric septal hypertrophy with a septal wall thickness of 1.7      cm.  No outflow tract obstruction.  2. Hyperlipidemia.  3. Atrial fibrillation.   MEDICATIONS:  Norpace 100 b.i.d., aspirin 81, Lipitor 10, Niaspan 1000.   ALLERGIES:  No known drug allergies.   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  He is  married and lives with his wife.  He denies any tobacco.  He does drink  social alcohol.   FAMILY HISTORY:  He has a brother with coronary artery disease status  post CABG.  His grandfather died at an old age from unclear causes but  no clear sudden cardiac death.  There is no history of sudden cardiac  death of other family members or known hypertrophic cardiomyopathy.   PHYSICAL EXAM:  He is well appearing, in no acute distress.  Respiratory  status is unlabored.  Blood pressure was initially 152/79 with a heart  rate of 70.  Follow-up blood pressure was 130/70.  Heart rate now 65.  HEENT:  Normal.  NECK:  Supple.  There is no JVD.  Carotids are 2+ bilaterally without  bruits.  There is no lymphadenopathy or thyromegaly.  CARDIAC:  PMI is nondisplaced.  He has a regular rate and rhythm.  No  murmurs, rubs or gallops.  There is no murmur even with Valsalva.  LUNGS:  Clear.  ABDOMEN:  Soft, nontender, nondistended.  No hepatosplenomegaly, no  bruits.  No masses.  Good bowel sounds.  EXTREMITIES:  Warm with no cyanosis, clubbing or edema.  No rash.  NEURO:  Alert and oriented x3.  Cranial nerves II-XII are intact.  Moves  all 4 extremities without difficulty.  Affect is pleasant.   Initial EKG shows atrial flutter with LVH and deep T-wave inversions  throughout the precordium.  A follow-up EKG shows sinus rhythm.   ASSESSMENT/PLAN:  1. Paroxysmal atrial flutter.  2. Asymmetric septal hypertrophy without any outflow tract      obstruction.   PLAN/DISCUSSION:  I have discussed the case with Dr.  Ladona Ridgel in  electrophysiology.  Will go ahead and start low-dose Lopressor at 25  b.i.d. and increase his Norpace to 150 b.i.d. to see if this drives down  his arrhythmias.  We did discuss the possibility of adding Coumadin.  I  think his CHADS score really is about 1 for structural heart disease.  Although he is mildly hypertensive here, he does not carry any history  of hypertension, thus I think he can safely use aspirin 325 a day.  He  is going out of town next week for work.  We will have him follow up in  about 10 days for a follow-up EKG and also to see Dr. Daleen Squibb.  He may need  a monitor as an outpatient.      Bevelyn Buckles. Bensimhon, MD  Electronically Signed     DRB/MEDQ  D:  07/03/2007  T:  07/03/2007  Job:  [id number]   cc:   Quita Skye. Artis Flock, M.D.  Thomas C. Daleen Squibb, MD, [facility]  Jarome Lamas, MD

## 2010-06-05 NOTE — Assessment & Plan Note (Signed)
[facility]                            CARDIOLOGY OFFICE NOTE   Ronnie Black, COMP                     MRN:          [id number]  DATE:05/07/2007                            DOB:          Dec 22, 1945    Ronnie Black comes in today per the request of Dr. Luanna Salk for new-onset  atrial fibrillation.   This past Friday night, he was feeling fine and then started feeling a  little bit tense.  He did not sleep well that night and then the next  day he felt even a little more tense and like he had had too much  caffeine.  He went to Urgent Care, where an EKG was taken which shows  atrial fibrillation with a well-controlled ventricular rate.  His blood  work included a free T4, T3 and a TSH, all of which were normal.  He was  told to increase his aspirin from 81 mg a day to 325 mg a day.   This morning he felt some irregularity but it was only brief.   He is currently in sinus rhythm with his baseline abnormal EKG with ST-  segment depression and deep T-wave inversion in almost every  electrocardiographic lead.   He has underlying nonobstructive hypertrophic cardiomyopathy.  He has  never had any atrial arrhythmias.  His last 2-D echocardiogram September 02, 2006, showed marked asymmetric septal hypertrophy, EF 70%, mild left  atrial and right atrial dilatation, no significant left ventricular  outflow tract gradient at rest.   He is still very active and he runs on a regular basis.  He has not  noticed any symptoms with exertion.   He also has a mixed hyperlipidemia.   CURRENT MEDICATIONS:  1. Enteric-coated aspirin 81 mg a day.  2. Protegra.  3. Centrum Silver.  4. Niaspan 1000 mg a day.  5. Lipitor 10 mg a day.   He has had a viral URI with a scratchy throat, otherwise negative.  He  does not take any decongestants.  He is taking some Advil.   EXAM TODAY:  His blood pressure is 132/77, his pulse is 66.  He is in  sinus rhythm as shown on his  EKG.  HEENT:  Normocephalic, atraumatic.  PERRLA.  Extraocular movements  intact.  Sclerae are clear.  Facial symmetry is normal.  Skin is warm and dry.  He is alert and oriented x3.  NECK:  Supple.  Carotids were equal bilaterally without bruits.  There  is no thyromegaly.  The trachea is midline.  LUNGS:  Clear.  HEART:  A regular rate and rhythm with a systolic murmur along the left  sternal border.  There is no rub.  ABDOMEN:  Soft, good bowel sounds.  No midline bruit.  EXTREMITIES:  No cyanosis, clubbing or edema.  Pulses are brisk  NEUROLOGIC:  SKIN:  Unremarkable.   ASSESSMENT/PLAN:  I have spent about 30 minutes talking to Ronnie Black  today addressing all of his different questions and concerns.  At the  present time I have given the following advice:   1. Avoid decongestants.  2. Zicam and fluids as well as Advil for his cold.  3. Do not resume exercise until he is over his respiratory illness.  4. Avoid stimulants such as caffeine, etc.  5. Continue with aspirin 325 mg a day.  6. Follow up with me in 4 weeks.   I have also told him how to check his carotid pulse and how to tell if  he is in atrial fibrillation or not.   Hopefully, he will have no recurrent atrial fibrillation in the near  future.  If he does, we have talked about antiarrhythmics and  anticoagulation.  At the present time, I would not want to proceed with  this.  He agrees with the plan.     Thomas C. Daleen Squibb, MD, [facility]  Electronically Signed    TCW/MedQ  DD: 05/07/2007  DT: 05/07/2007  Job #: [id number]   cc:   Quita Skye. Artis Flock, M.D.

## 2010-06-05 NOTE — Assessment & Plan Note (Signed)
[facility]                            CARDIOLOGY OFFICE NOTE   CARRON, JAGGI                     MRN:          [id number]  DATE:08/04/2007                            DOB:          July 21, 1945    Mr. Larue returns today for recent problems with atrial fibrillation.  Dr. Bradd Canary called me over the phone at which time he had gone back  into atrial fibrillation.  This was on Jun 08, 2007.  We placed him on  Norpace 150 mg b.i.d.  He followed up with an EKG which was stable.   Remarkably, he says I feel better and can exercise, my heart rate now  below 100 which he is happy about.  He has had no side effects from  Lopressor or Norpace.  He has had no further atrial fibrillation.   MEDICATIONS:  His other meds includes:  1. Lopressor 25 b.i.d.  2. Lipitor 10 mg a day.  3. Niaspan 1000 mg a day.  4. Enteric-coated aspirin 325 a day.   PHYSICAL EXAMINATION:  VITAL SIGNS:  His blood pressure today is 142/83,  his pulse 63 and regular, and his weight is 154.  HEENT:  Unchanged.  NECK:  Carotids are full.  Carotids not quite as dynamic.  Thyroid is  not enlarged.  Trachea is midline.  LUNGS:  Clear.  HEART:  Regular rate and rhythm.  No S4 gallop.  He has a soft systolic  murmur.  ABDOMEN:  Soft.  Good bowel sounds.  No midline bruit.  EXTREMITIES:  No cyanosis, clubbing, or edema.  Pulses are intact.  NEUROLOGIC:  Intact.   His electrocardiogram shows sinus rhythm with diffuse ST-T wave changes.  He has an RSR prime V1-V2.  His PR, QRS, and QTC are stable.  There has  been no significant change from before.   Mr. Wacker had about 20 minutes worth of questions.  We renewed his  medication and answered all his questions.   I will plan on seeing him back in 6 months unless he breaks through with  his atrial fibrillation.     Thomas C. Daleen Squibb, MD, [facility]  Electronically Signed   TCW/MedQ  DD: 08/04/2007  DT: 08/05/2007  Job #: [id number]   cc:   Quita Skye. Artis Flock, M.D.

## 2010-06-05 NOTE — Assessment & Plan Note (Signed)
[facility]                            CARDIOLOGY OFFICE NOTE   ARNEY, MAYABB                     MRN:          [id number]  DATE:08/25/2006                            DOB:          [birth year]    Mr. Buchan returns today for further management of the following issues:  1. Hypertrophic cardiomyopathy.  He is due a 2-D echocardiogram.  His      last echocardiogram showed no significant left ventricular outflow      tract gradient.  He had mild mitral regurgitation.  He is totally      asymptomatic.  No syncope, presyncope, tachy palpitations, chest      pain, shortness of breath.  He just got back from hiking in [place]      for 2 weeks.  He had no symptoms.  2. Hyperlipidemia.  Lipids by Dr. Artis Flock on July 30, 2006 show a total      cholesterol of 157, triglycerides 102, HDL 54, LDL 83.  LFTs were      normal.  The rest of his blood work was unremarkable.   His biggest question today is that he does not want to get folic acid  toxic by taking 2 vitamins.  I have told him to delete [employer].   MEDICATIONS:  1. Aspirin 81 mg daily.  2. Centrum Silver vitamin.  3. Niaspan 1000 mg daily.  4. Lipitor 10 mg daily.   PHYSICAL EXAMINATION:  VITAL SIGNS:  His blood pressure is 118/78, pulse  57 and regular.  EKG confirms again sinus brady with marked ST segment T  wave changes diffusely, consistent with his HOCM.  Weight is stable at  153.  HEENT:  Unchanged.  NECK:  Carotids are full.  There are no bruits.  Thyroid is not  enlarged.  Trachea is midline.  LUNGS:  Clear.  HEART:  Regular rate and rhythm.  There is no significant murmur.  ABDOMEN:  Soft, good bowel sounds.  No midline bruit.  EXTREMITIES:  No clubbing, cyanosis, or edema.  Pulses are intact.  NEUROLOGIC:  Intact.   ASSESSMENT AND PLAN:  Mr. Grandfield is doing well.  I have made no changes  to his program.  I concur with him stopping the [employer].  I will  see him back in  a year.   We will arrange a 2-D echocardiogram for followup in the meantime.    Thomas C. Daleen Squibb, MD, [facility]  Electronically Signed   TCW/MedQ  DD: 08/25/2006  DT: 08/25/2006  Job #: [id number]   cc:   Quita Skye. Artis Flock, M.D.

## 2010-06-05 NOTE — Assessment & Plan Note (Signed)
[facility] HEALTHCARE                            CARDIOLOGY OFFICE NOTE   DOMINIQ, FONTAINE                     MRN:          [id number]  DATE:06/08/2007                            DOB:          11/28/1945    Mr. Puleo comes in today for further management of one episode of  atrial fibrillation and has nonobstructive hypertrophic cardiomyopathy.   He has had no further episodes of atrial fibrillation.   He has never had any dyspnea, chest pain or any symptoms from his  cardiomyopathy.   He is over his cold and is being very careful about what he takes in.  We have gone over that list again today.   He is getting ready to go to [place] for a two-week vacation.   His current blood pressure is 137/80.  His pulse is 65 and regular.  Weight is 153.  His exam is unchanged.   I had a 20-minute plus discussion with Mr. Matsuo today about the  possibility of starting a beta-blocker and Norpace for preventing atrial  fibrillation with a rapid ventricular rate.  At the present time, he has  done so well and only had one episode that will wait until the next  event.  Also, I am not anxious to start two medicines prior to him  leaving the country.   I will have return in 6 weeks.     Thomas C. Daleen Squibb, MD, [facility]  Electronically Signed    TCW/MedQ  DD: 06/08/2007  DT: 06/08/2007  Job #: [id number]   cc:   Quita Skye. Artis Flock, M.D.

## 2010-06-05 NOTE — Assessment & Plan Note (Signed)
[facility]ALTHCARE                            CARDIOLOGY OFFICE NOTE   JAMISON, SOWARD                       MRN:          [id number]  DATE:07/02/2007                            DOB:          24-Dec-1945    Ronnie Black came into [place] [facility] office today and was noted to be back  in atrial fib.  Dr. Artis Flock and I had a conversation.  Now that Ronnie Black  is not going out of the country, we will start him on Norpace CR 100 mg  p.o. b.i.d.  This may have to be increased to 200 b.i.d.   If he stays in atrial fib for more than 48 hours, he has been advised to  come to the hospital for intravenous anticoagulation.  He is not on  Coumadin.  If he continues to have problems, we may have to increase his  dose or switch to a different medication.     Thomas C. Daleen Squibb, MD, [facility]  Electronically Signed    TCW/MedQ  DD: 07/02/2007  DT: 07/02/2007  Job #: [id number]

## 2010-06-05 NOTE — Assessment & Plan Note (Signed)
[facility] HEALTHCARE                                 ON-CALL NOTE   [name], [name]                       MRN:          [id number]  DATE:05/02/2007                            DOB:          11-20-1945    PRIMARY CARDIOLOGIST:  Jesse Sans. Wall, MD.   PRIMARY CARE PHYSICIAN:  Quita Skye. Kindl, MD.   I received a phone call from Dr. Artis Flock regarding [name].  He  presented on Saturday to the urgent care describing a sense of  palpitations and was ultimately diagnosed with presumably recent-onset  atrial fibrillation, although with controlled ventricular response on no  specific medications.  He was not experiencing any chest pain or  breathlessness.  His electrocardiogram was described as having  significant T-wave inversions, although this seems to be old based on a  review of Dr. Vern Claude note from August 2008, which also describes fairly  marked ST-T wave changes in the setting of a known hypertrophic  cardiomyopathy.  In reviewing the patient's history, I see that he has  no history of any resting left ventricular outflow tract gradient and  that he remains active, in fact, running regularly.  I spoke with Dr.  Artis Flock about a general plan and we decided to have the patient increase  his aspirin from 81 mg daily to 325 mg daily and have him follow up in  the office with [name] next week.  If he remains in atrial fibrillation,  then a discussion regarding Coumadin is likely and, for that matter,  perhaps a discussion regarding an attempt at cardioversion.  Otherwise,  the patient was described as being stable and if he has any progressive  symptoms, we can certainly see him more urgently.  They were comfortable  with this.     Jonelle Sidle, MD  Electronically Signed    SGM/MedQ  DD: 05/02/2007  DT: 05/02/2007  Job #: [id number]   cc:   Thomas C. Daleen Squibb, MD, [facility]  Quita Skye. Artis Flock, M.D.

## 2010-06-05 NOTE — Assessment & Plan Note (Signed)
[facility] HEALTHCARE                            CARDIOLOGY OFFICE NOTE   ISABELLA, IDA                     MRN:          [id number]  DATE:02/02/2008                            DOB:          [birth year]    Ronnie Black comes in today for followup of his following issues:  1. Hypertrophic obstructive cardiomyopathy.  He has no resting      gradient and is relatively asymptomatic.  Last echo September 02, 2006.  2. Paroxysmal atrial fibrillation with a rapid ventricular rate.  This      responded beautifully to beta-blockade and Norpace.  He has had no      clinical recurrences and was quite symptomatic when he had this      last year.  3. Mixed hyperlipidemia followed by Dr. Luanna Salk.  He is due blood      work next summer.   He has no complaints whatsoever.  He remains very active.  He  specifically denies any syncope, presyncope, chest discomfort, shortness  of breath, or tachy palpitations.   His meds include Lipitor 10 mg a day, Lopressor 25 mg p.o. b.i.d.,  Norvasc 150 mg p.o. b.i.d., enteric-coated aspirin 325 mg a day,  multivitamin 2-3 times a week, Niaspan 1 g daily.   PHYSICAL EXAMINATION:  VITAL SIGNS:  His blood pressure is 120/80, his  pulse is 60 and regular, his weight is 158.4.  HEENT:  Normal.  NECK:  Carotids are full without bruits.  Thyroid is not enlarged.  Trachea is midline.  LUNGS:  Clear to auscultation and percussion.  HEART:  Reveals a nondisplaced PMI.  There is no S4.  There is no  gallop.  Soft 2/6 systolic murmur when he sits up.  ABDOMEN:  Soft.  Good bowel sounds.  No midline bruit.  No pulsatile  mass.  EXTREMITIES:  No cyanosis, clubbing, or edema.  Pulses are intact.  NEUROLOGIC:  Intact.   His electrocardiogram shows sinus bradycardia with ST-T wave changes  that are consistent with previous ECGs.  His QTC is hard to measure, but  his PR interval is normal.  There has been no significant change since  his  previous ECG.  He is clearly in sinus rhythm.   Ronnie Black is doing well.  I have made no change in his medical program.  I will see him back in 6 months.     Thomas C. Daleen Squibb, MD, [facility]  Electronically Signed    TCW/MedQ  DD: 02/02/2008  DT: 02/03/2008  Job #: [id number]

## 2010-06-06 ENCOUNTER — Other Ambulatory Visit: Payer: BC Managed Care – PPO

## 2010-06-06 ENCOUNTER — Encounter: Payer: Self-pay | Admitting: Internal Medicine

## 2010-06-06 ENCOUNTER — Ambulatory Visit (INDEPENDENT_AMBULATORY_CARE_PROVIDER_SITE_OTHER)
Admission: RE | Admit: 2010-06-06 | Discharge: 2010-06-06 | Disposition: A | Discharge status: Home / Self Care | Payer: BC Managed Care – PPO | Source: Ambulatory Visit | Attending: Internal Medicine | Admitting: Internal Medicine

## 2010-06-06 ENCOUNTER — Ambulatory Visit (INDEPENDENT_AMBULATORY_CARE_PROVIDER_SITE_OTHER): Payer: BC Managed Care – PPO | Admitting: Internal Medicine

## 2010-06-06 ENCOUNTER — Other Ambulatory Visit (INDEPENDENT_AMBULATORY_CARE_PROVIDER_SITE_OTHER): Payer: BC Managed Care – PPO

## 2010-06-06 DIAGNOSIS — I421 Obstructive hypertrophic cardiomyopathy: Secondary | ICD-10-CM

## 2010-06-06 DIAGNOSIS — Z79899 Other long term (current) drug therapy: Secondary | ICD-10-CM

## 2010-06-06 DIAGNOSIS — I4891 Unspecified atrial fibrillation: Secondary | ICD-10-CM

## 2010-06-06 LAB — TSH: TSH: 2.95 u[IU]/mL (ref 0.35–5.50)

## 2010-06-06 LAB — HEPATIC FUNCTION PANEL
ALT: 44 U/L (ref 0–53)
AST: 32 U/L (ref 0–37)
Albumin: 3.9 g/dL (ref 3.5–5.2)
Alkaline Phosphatase: 72 U/L (ref 39–117)
Bilirubin, Direct: 0.1 mg/dL (ref 0.0–0.3)
Total Bilirubin: 0.6 mg/dL (ref 0.3–1.2)
Total Protein: 6.4 g/dL (ref 6.0–8.3)

## 2010-06-06 LAB — T4, FREE: Free T4: 0.94 ng/dL (ref 0.60–1.60)

## 2010-06-06 NOTE — Assessment & Plan Note (Signed)
Doing well s/p afib ablation He is presently maintaining sinus rhythm though he did have ERAF initially. We will decrease amiodarone to 200mg  BID today x 2 weeks, then 200 mg daily. Check TFTs and LFTs, CXR today Return in 3 months We will consider decreasing amiodarone to 100mg  daily at that time if he is maintaining sinus rhythm. Continue pradaxa.

## 2010-06-06 NOTE — Progress Notes (Signed)
The patient presents today for routine electrophysiology followup.  Since last being seen in our clinic, the patient reports doing very well.  He has been in sinus rhythm for several weeks now and is tolerating amiodarone without difficulty.  Today, he denies symptoms of palpitations, chest pain, shortness of breath, orthopnea, PND, lower extremity edema, dizziness, presyncope, syncope, or neurologic sequela.  The patient feels that he is tolerating medications without difficulties and is otherwise without complaint today.   Past Medical History  Diagnosis Date  . Hypertrophic obstructive cardiomyopathy     without significant obstruction  . Persistent atrial fibrillation     s/p PVI 3/12  . HLD (hyperlipidemia)   . DDD (degenerative disc disease)    Past Surgical History  Procedure Date  . Appendectomy   . Atrial fibrillation ablation 03/27/10    PVI with CTI ablation by [facility]    Current Outpatient Prescriptions  Medication Sig Dispense Refill  . amiodarone (PACERONE) 200 MG tablet 2 tablets twice daily  120 tablet  6  . atorvastatin (LIPITOR) 10 MG tablet Take 10 mg by mouth daily.        . Cholecalciferol (VITAMIN D) 1000 UNITS capsule Take 1,000 Units by mouth daily.        . fish oil-omega-3 fatty acids 1000 MG capsule Take 1 g by mouth daily.       Marland Kitchen loratadine (CLARITIN) 10 MG tablet Take 10 mg by mouth as needed.        . metoprolol tartrate (LOPRESSOR) 25 MG tablet Take 25 mg by mouth 2 (two) times daily.        . Multiple Vitamin (MULTIVITAMIN) tablet Take 1 tablet by mouth daily.        . niacin (NIASPAN) 1000 MG CR tablet Take 1,000 mg by mouth at bedtime.        Marland Kitchen PRADAXA 150 MG CAPS take 1 capsule by mouth twice a day  60 capsule  3  . DISCONTD: disopyramide (NORPACE) 150 MG capsule Take 150 mg by mouth 2 (two) times daily.          Allergies not on file  History   Social History  . Marital Status: Married    Spouse Name: N/A    Number of Children: N/A  . Years of  Education: N/A   Occupational History  . professor [employer]   Social History Main Topics  . Smoking status: Former Games developer  . Smokeless tobacco: Not on file  . Alcohol Use: 4.2 oz/week    7 Glasses of wine per week     rare wine  . Drug Use: No  . Sexually Active: Not on file   Other Topics Concern  . 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    Family History  Problem Relation Age of Onset  . Coronary artery disease     Physical Exam: Filed Vitals:   06/06/10 0926  BP: 132/68  Pulse: 64  Resp: 14  Height: 5\' 6"  (1.676 m)  Weight: 153 lb (69.4 kg)    GEN- The patient is well appearing, alert and oriented x 3 today.   Head- normocephalic, atraumatic Eyes-  Sclera clear, conjunctiva pink Ears- hearing intact Oropharynx- clear Neck- supple, no JVP Lymph- no cervical lymphadenopathy Lungs- Clear to ausculation bilaterally, normal work of breathing Heart- Regular rate and rhythm, no murmurs, rubs or gallops, PMI not laterally displaced GI- soft, NT, ND, +  BS Extremities- no clubbing, cyanosis, or edema MS- no significant deformity or atrophy Skin- no rash or lesion Psych- euthymic mood, full affect Neuro- strength and sensation are intact  ekg today reveals sinus bradycardia 52 bpm, PR 232, incomplete RBBB, stable TWI  Assessment and Plan:

## 2010-06-06 NOTE — Patient Instructions (Signed)
Your physician recommends that you schedule a follow-up appointment in: 3 MONTHS WITH DR [facility]   Your physician has recommended you make the following change in your medication: DECREASE AMIODARONE TO 200 MG  TWICE DAILY FOR 2 WEEKS THEN DECREASE TO 200 MG EVERY DAY  Your physician recommends that you return for lab work in: LIVER TSH FREE T4  V 58.69  A chest x-ray takes a picture of the organs and structures inside the chest, including the heart, lungs, and blood vessels. This test can show several things, including, whether the heart is enlarges; whether fluid is building up in the lungs; and whether pacemaker / defibrillator leads are still in place.  PA AND LATERAL 8.69

## 2010-06-06 NOTE — Assessment & Plan Note (Signed)
Stable No change required today  

## 2010-06-08 NOTE — Assessment & Plan Note (Signed)
[facility] HEALTHCARE                              CARDIOLOGY OFFICE NOTE   JUSTEN, FONDA                     MRN:          [id number]  DATE:08/21/2005                            DOB:          17-Oct-1945    Mr. Passage returns today for further management of her hypertrophic  cardiomyopathy and hyperlipidemia.   He is totally asymptomatic with no syncope, presyncope, tachy palpitations.  A 2-D echocardiogram showed HOCM to be stable, ejection fraction 70% with no  left ventricular outflow tract gradient.  A mild mitral regurgitation.  Echo  August 30, 2004.   Bloodwork on Crestor 5 and Niaspan 1000 mg showed a total cholesterol 149,  triglycerides 66, HDL 54, LDL 82.  These are fairly comparable to last year.   His biggest concern today is that he is paying more for Crestor and wants me  to change him to a different drug per his prescription plan.  I have chosen  Lipitor 10.  We will need to recheck labs in 6 weeks which he found  surprising.   His other meds are aspirin 81 mg a day, Centrum multivitamin, Protegra,  Niaspan 1000 mg a day, Crestor 5 mg a day.   PHYSICAL EXAMINATION:  VITAL SIGNS:  Blood pressure today is 120/80, pulse  71 and regular, weight 153 down 2.  GENERAL:  He is the picture of health in no acute distress.  SKIN:  Warm and dry.  HEENT:  Unremarkable. Carotid upstrokes were equal and bilateral without  bruits.  NECK:  No JVD, thyroid not enlarged. Trachea is midline.  LUNGS:  Clear.  HEART:  Regular rate and rhythm.  ABDOMEN:  Soft, good bowel sounds, no midline bruit, no hepatosplenomegaly.  Bowel sounds present.  EXTREMITIES:  No cyanosis, clubbing, or edema.  Pulses are brisk.   His electrocardiogram is at baseline without any changes in his ST segment  changes.   ASSESSMENT/PLAN:  Mr. Pankow is doing well.  I have changed him to Lipitor  10 per his request.  He will need lipids and LFTs in 6 weeks which he will  get through Dr. Blair Heys office.  I told him to make sure I see a copy.  Otherwise, I will see him back in 1 year.                               Thomas C. Daleen Squibb, MD, [facility]    TCW/MedQ  DD:  08/21/2005  DT:  08/21/2005  Job #:  [id number]   cc:   Quita Skye. Artis Flock, MD

## 2010-06-12 NOTE — Progress Notes (Signed)
lmom with results

## 2010-07-09 ENCOUNTER — Encounter: Payer: BC Managed Care – PPO | Admitting: Internal Medicine

## 2010-08-06 ENCOUNTER — Other Ambulatory Visit: Payer: Self-pay | Admitting: Cardiology

## 2010-09-05 ENCOUNTER — Encounter: Payer: Self-pay | Admitting: Internal Medicine

## 2010-09-05 ENCOUNTER — Other Ambulatory Visit: Payer: Self-pay | Admitting: Internal Medicine

## 2010-09-05 ENCOUNTER — Ambulatory Visit (INDEPENDENT_AMBULATORY_CARE_PROVIDER_SITE_OTHER): Payer: BC Managed Care – PPO | Admitting: Internal Medicine

## 2010-09-05 VITALS — BP 138/78 | HR 55 | Ht 66.0 in | Wt 154.0 lb

## 2010-09-05 DIAGNOSIS — I421 Obstructive hypertrophic cardiomyopathy: Secondary | ICD-10-CM

## 2010-09-05 DIAGNOSIS — I4891 Unspecified atrial fibrillation: Secondary | ICD-10-CM

## 2010-09-05 NOTE — Progress Notes (Signed)
The patient presents today for routine electrophysiology followup.  Since last being seen in our clinic, the patient reports doing very well.  He denies any afib.  He is tolerating amiodarone without difficulty.  He is pleased with the results of his ablation. Today, he denies symptoms of palpitations, chest pain, shortness of breath, orthopnea, PND, lower extremity edema, dizziness, presyncope, syncope, or neurologic sequela.  The patient feels that he is tolerating medications without difficulties and is otherwise without complaint today.   Past Medical History  Diagnosis Date  . Hypertrophic obstructive cardiomyopathy     without significant obstruction  . Persistent atrial fibrillation     s/p PVI 3/12  . HLD (hyperlipidemia)   . DDD (degenerative disc disease)    Past Surgical History  Procedure Date  . Appendectomy   . Atrial fibrillation ablation 03/27/10    PVI with CTI ablation by [facility]    Current Outpatient Prescriptions  Medication Sig Dispense Refill  . amiodarone (PACERONE) 200 MG tablet daily.        . Cholecalciferol (VITAMIN D) 1000 UNITS capsule Take 1,000 Units by mouth daily.        . fish oil-omega-3 fatty acids 1000 MG capsule Take 1 g by mouth daily.       Marland Kitchen LIPITOR 10 MG tablet TAKE 1 TABLET BY MOUTH ONCE DAILY  30 tablet  3  . loratadine (CLARITIN) 10 MG tablet Take 10 mg by mouth as needed.        . metoprolol tartrate (LOPRESSOR) 25 MG tablet TAKE 1 TABLET BY MOUTH TWICE A DAY  60 tablet  10  . Multiple Vitamin (MULTIVITAMIN) tablet Take 1 tablet by mouth daily.        . niacin (NIASPAN) 1000 MG CR tablet Taking 1/2 tab daily      . PRADAXA 150 MG CAPS take 1 capsule by mouth twice a day  60 capsule  3  . DISCONTD: amiodarone (PACERONE) 200 MG tablet 2 tablets twice daily  120 tablet  6    No Known Allergies  History   Social History  . Marital Status: Married    Spouse Name: N/A    Number of Children: N/A  . Years of Education: N/A   Occupational  History  . professor [employer]   Social History Main Topics  . Smoking status: Former Games developer  . Smokeless tobacco: Not on file  . Alcohol Use: 4.2 oz/week    7 Glasses of wine per week     rare wine  . Drug Use: No  . Sexually Active: Not on file   Other Topics Concern  . 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    Family History  Problem Relation Age of Onset  . Coronary artery disease     Physical Exam: Filed Vitals:   09/05/10 1534  BP: 138/78  Pulse: 55  Height: 5\' 6"  (1.676 m)  Weight: 154 lb (69.854 kg)    GEN- The patient is well appearing, alert and oriented x 3 today.   Head- normocephalic, atraumatic Eyes-  Sclera clear, conjunctiva pink Ears- hearing intact Oropharynx- clear Neck- supple, no JVP Lymph- no cervical lymphadenopathy Lungs- Clear to ausculation bilaterally, normal work of breathing Heart- Regular rate and rhythm, no murmurs, rubs or gallops, PMI not laterally displaced GI- soft, NT, ND, + BS Extremities- no clubbing, cyanosis, or edema MS- no significant deformity or atrophy Skin- no  rash or lesion Psych- euthymic mood, full affect Neuro- strength and sensation are intact  ekg today reveals sinus bradycardia 56 bpm, PR 184, incomplete RBBB, stable TWI  Assessment and Plan:

## 2010-09-05 NOTE — Assessment & Plan Note (Signed)
Stable No changes 

## 2010-09-05 NOTE — Patient Instructions (Signed)
Your physician recommends that you schedule a follow-up appointment in 3 months with Dr Allred    

## 2010-09-05 NOTE — Assessment & Plan Note (Addendum)
Maintaining sinus rhythm post ablation We will decrease amiodarone to 100mg  daily today If he has no further afib, we will stop amiodarone upon return  Continue pradaxa at this time.  We will check TFTs/LFTs today on amiodarone and also check CrCl and CBC on pradaxa.

## 2010-09-06 LAB — BASIC METABOLIC PANEL
BUN: 15 mg/dL (ref 6–23)
CO2: 27 mEq/L (ref 19–32)
Calcium: 9 mg/dL (ref 8.4–10.5)
Chloride: 101 mEq/L (ref 96–112)
Creatinine, Ser: 0.9 mg/dL (ref 0.4–1.5)
GFR: 88.25 mL/min (ref >60.00)
Glucose, Bld: 102 mg/dL — ABNORMAL HIGH (ref 70–99)
Potassium: 4 mEq/L (ref 3.5–5.1)
Sodium: 137 mEq/L (ref 135–145)

## 2010-09-06 LAB — CBC WITH DIFFERENTIAL/PLATELET
Basophils Absolute: 0 10*3/uL (ref 0.0–0.1)
Basophils Relative: 0.5 % (ref 0.0–3.0)
Eosinophils Absolute: 0.3 10*3/uL (ref 0.0–0.7)
Eosinophils Relative: 4.4 % (ref 0.0–5.0)
HCT: 43.8 % (ref 39.0–52.0)
Hemoglobin: 14.8 g/dL (ref 13.0–17.0)
Lymphocytes Relative: 30.4 % (ref 12.0–46.0)
Lymphs Abs: 1.8 10*3/uL (ref 0.7–4.0)
MCHC: 33.8 g/dL (ref 30.0–36.0)
MCV: 89.7 fl (ref 78.0–100.0)
Monocytes Absolute: 0.6 10*3/uL (ref 0.1–1.0)
Monocytes Relative: 9.6 % (ref 3.0–12.0)
Neutro Abs: 3.3 10*3/uL (ref 1.4–7.7)
Neutrophils Relative %: 55.1 % (ref 43.0–77.0)
Platelets: 199 10*3/uL (ref 150.0–400.0)
RBC: 4.88 Mil/uL (ref 4.22–5.81)
RDW: 13.1 % (ref 11.5–14.6)
WBC: 6 10*3/uL (ref 4.5–10.5)

## 2010-09-06 LAB — HEPATIC FUNCTION PANEL
ALT: 52 U/L (ref 0–53)
AST: 34 U/L (ref 0–37)
Albumin: 4.3 g/dL (ref 3.5–5.2)
Alkaline Phosphatase: 73 U/L (ref 39–117)
Bilirubin, Direct: 0.1 mg/dL (ref 0.0–0.3)
Total Bilirubin: 0.8 mg/dL (ref 0.3–1.2)
Total Protein: 7 g/dL (ref 6.0–8.3)

## 2010-09-06 LAB — T4, FREE: Free T4: 0.96 ng/dL (ref 0.60–1.60)

## 2010-09-06 LAB — TSH: TSH: 2.05 u[IU]/mL (ref 0.35–5.50)

## 2010-09-10 ENCOUNTER — Encounter: Payer: Self-pay | Admitting: *Deleted

## 2010-10-16 LAB — T4, FREE: Free T4: 1.08

## 2010-10-16 LAB — TSH: TSH: 2.7

## 2010-10-16 LAB — T3: T3, Total: 97 (ref 80.0–204.0)

## 2010-10-18 LAB — POCT I-STAT, CHEM 8
BUN: 11
Calcium, Ion: 1.17
Chloride: 102
Creatinine, Ser: 1
Glucose, Bld: 89
HCT: 50
Hemoglobin: 17
Potassium: 4
Sodium: 137
TCO2: 30

## 2010-10-18 LAB — POCT CARDIAC MARKERS
CKMB, poc: <1 — ABNORMAL LOW
Myoglobin, poc: 45
Operator id: 161631
Troponin i, poc: <0.05

## 2010-11-06 ENCOUNTER — Other Ambulatory Visit: Payer: Self-pay | Admitting: Cardiology

## 2010-11-28 ENCOUNTER — Ambulatory Visit (INDEPENDENT_AMBULATORY_CARE_PROVIDER_SITE_OTHER): Payer: BC Managed Care – PPO | Admitting: Internal Medicine

## 2010-11-28 ENCOUNTER — Encounter: Payer: Self-pay | Admitting: Internal Medicine

## 2010-11-28 DIAGNOSIS — I4891 Unspecified atrial fibrillation: Secondary | ICD-10-CM

## 2010-11-28 DIAGNOSIS — E785 Hyperlipidemia, unspecified: Secondary | ICD-10-CM

## 2010-11-28 DIAGNOSIS — I421 Obstructive hypertrophic cardiomyopathy: Secondary | ICD-10-CM

## 2010-11-28 NOTE — Patient Instructions (Signed)
Your physician wants you to follow-up in: 3 months with Dr Johney Frame Bonita Quin will receive a reminder letter in the mail two months in advance. If you don't receive a letter, please call our office to schedule the follow-up appointment.  Your physician has requested that you have an echocardiogram. Echocardiography is a painless test that uses sound waves to create images of your heart. It provides your doctor with information about the size and shape of your heart and how well your heart's chambers and valves are working. This procedure takes approximately one hour. There are no restrictions for this procedure.

## 2010-11-28 NOTE — Assessment & Plan Note (Signed)
Stable No change required today  

## 2010-11-28 NOTE — Assessment & Plan Note (Signed)
Stable Repeat echo as above

## 2010-11-28 NOTE — Assessment & Plan Note (Signed)
Doing very well s/p afib ablation  Stop amiodarone Continue pradaxa  Repeat echo

## 2010-11-28 NOTE — Progress Notes (Signed)
The patient presents today for routine electrophysiology followup.  Since last being seen in our clinic, the patient reports doing very well.  He denies any afib.  He is tolerating amiodarone without difficulty.  He is pleased with the results of his ablation. Today, he denies symptoms of palpitations, chest pain, shortness of breath, orthopnea, PND, lower extremity edema, dizziness, presyncope, syncope, or neurologic sequela.  The patient feels that he is tolerating medications without difficulties and is otherwise without complaint today.   Past Medical History  Diagnosis Date  . Hypertrophic obstructive cardiomyopathy     without significant obstruction  . Persistent atrial fibrillation     s/p PVI 3/12  . HLD (hyperlipidemia)   . DDD (degenerative disc disease)    Past Surgical History  Procedure Date  . Appendectomy   . Atrial fibrillation ablation 03/27/10    PVI with CTI ablation by [facility]    Current Outpatient Prescriptions  Medication Sig Dispense Refill  . amiodarone (PACERONE) 200 MG tablet Take 100 mg by mouth daily.       . fish oil-omega-3 fatty acids 1000 MG capsule Take 1 g by mouth daily.       Marland Kitchen LIPITOR 10 MG tablet TAKE 1 TABLET BY MOUTH ONCE DAILY  30 tablet  3  . loratadine (CLARITIN) 10 MG tablet Take 10 mg by mouth as needed.        . metoprolol tartrate (LOPRESSOR) 25 MG tablet TAKE 1 TABLET BY MOUTH TWICE A DAY  60 tablet  10  . Multiple Vitamin (MULTIVITAMIN) tablet Take 1 tablet by mouth daily.        . niacin (NIASPAN) 1000 MG CR tablet        . PRADAXA 150 MG CAPS take 1 capsule by mouth twice a day  60 capsule  3    No Known Allergies  History   Social History  . Marital Status: Married    Spouse Name: N/A    Number of Children: N/A  . Years of Education: N/A   Occupational History  . professor [employer]   Social History Main Topics  . Smoking status: Former Smoker -- 1.0 packs/day for 15 years    Types: Cigarettes    Quit date: 11/28/1975  .  Smokeless tobacco: Not on file  . Alcohol Use: 4.2 oz/week    7 Glasses of wine per week     rare wine  . Drug Use: No  . Sexually Active: Not on file   Other Topics Concern  . 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    Family History  Problem Relation Age of Onset  . Coronary artery disease     Physical Exam: Filed Vitals:   11/28/10 1515  BP: 148/77  Pulse: 61  Height: 5\' 6"  (1.676 m)  Weight: 155 lb (70.308 kg)    GEN- The patient is well appearing, alert and oriented x 3 today.   Head- normocephalic, atraumatic Eyes-  Sclera clear, conjunctiva pink Ears- hearing intact Oropharynx- clear Neck- supple, no JVP Lymph- no cervical lymphadenopathy Lungs- Clear to ausculation bilaterally, normal work of breathing Heart- Regular rate and rhythm, no murmurs, rubs or gallops, PMI not laterally displaced GI- soft, NT, ND, + BS Extremities- no clubbing, cyanosis, or edema MS- no significant deformity or atrophy Skin- no rash or lesion Psych- euthymic mood, full affect Neuro- strength and sensation are intact  ekg today reveals  sinus bradycardia 56 bpm, PR 206, incomplete RBBB, stable TWI  Assessment and Plan:

## 2010-12-08 ENCOUNTER — Other Ambulatory Visit: Payer: Self-pay | Admitting: Cardiology

## 2010-12-17 ENCOUNTER — Ambulatory Visit (HOSPITAL_COMMUNITY): Payer: BC Managed Care – PPO | Attending: Cardiology | Admitting: Radiology

## 2010-12-17 DIAGNOSIS — I421 Obstructive hypertrophic cardiomyopathy: Secondary | ICD-10-CM | POA: Insufficient documentation

## 2010-12-17 DIAGNOSIS — I079 Rheumatic tricuspid valve disease, unspecified: Secondary | ICD-10-CM | POA: Insufficient documentation

## 2010-12-17 DIAGNOSIS — I059 Rheumatic mitral valve disease, unspecified: Secondary | ICD-10-CM | POA: Insufficient documentation

## 2010-12-17 DIAGNOSIS — E785 Hyperlipidemia, unspecified: Secondary | ICD-10-CM | POA: Insufficient documentation

## 2011-01-06 ENCOUNTER — Other Ambulatory Visit: Payer: Self-pay | Admitting: Internal Medicine

## 2011-03-07 ENCOUNTER — Ambulatory Visit (INDEPENDENT_AMBULATORY_CARE_PROVIDER_SITE_OTHER): Payer: BC Managed Care – PPO | Admitting: Internal Medicine

## 2011-03-07 ENCOUNTER — Encounter: Payer: Self-pay | Admitting: Internal Medicine

## 2011-03-07 DIAGNOSIS — I4891 Unspecified atrial fibrillation: Secondary | ICD-10-CM

## 2011-03-07 DIAGNOSIS — I421 Obstructive hypertrophic cardiomyopathy: Secondary | ICD-10-CM

## 2011-03-07 MED ORDER — ASPIRIN 325 MG PO TBEC: 325.0000 mg | DELAYED_RELEASE_TABLET | Freq: Every day | ORAL | Status: DC

## 2011-03-07 NOTE — Assessment & Plan Note (Signed)
Recent echo is reviewed No gradient  He will follow-up with Dr Daleen Squibb in 3 months

## 2011-03-07 NOTE — Progress Notes (Signed)
The patient presents today for routine electrophysiology followup.  Since last being seen in our clinic, the patient reports doing very well.  He denies any afib.  Today, he denies symptoms of palpitations, chest pain, shortness of breath, orthopnea, PND, lower extremity edema, dizziness, presyncope, syncope, or neurologic sequela.  The patient feels that he is tolerating medications without difficulties and is otherwise without complaint today.   Past Medical History  Diagnosis Date  . Hypertrophic obstructive cardiomyopathy     without significant obstruction  . Persistent atrial fibrillation     s/p PVI 3/12  . HLD (hyperlipidemia)   . DDD (degenerative disc disease)    Past Surgical History  Procedure Date  . Appendectomy   . Atrial fibrillation ablation 03/27/10    PVI with CTI ablation by [facility]    Current Outpatient Prescriptions  Medication Sig Dispense Refill  . atorvastatin (LIPITOR) 10 MG tablet TAKE 1 TABLET BY MOUTH ONCE DAILY  30 tablet  3  . fish oil-omega-3 fatty acids 1000 MG capsule Take 1 g by mouth daily.       Marland Kitchen loratadine (CLARITIN) 10 MG tablet Take 10 mg by mouth as needed.        . metoprolol tartrate (LOPRESSOR) 25 MG tablet TAKE 1 TABLET BY MOUTH TWICE A DAY  60 tablet  10  . Multiple Vitamin (MULTIVITAMIN) tablet Take 1 tablet by mouth daily.        . niacin (NIASPAN) 1000 MG CR tablet Take 1,000 mg by mouth at bedtime.       Marland Kitchen PRADAXA 150 MG CAPS take 1 capsule by mouth twice a day  60 capsule  3    No Known Allergies  History   Social History  . Marital Status: Married    Spouse Name: N/A    Number of Children: N/A  . Years of Education: N/A   Occupational History  . professor [employer]   Social History Main Topics  . Smoking status: Former Smoker -- 1.0 packs/day for 15 years    Types: Cigarettes    Quit date: 11/28/1975  . Smokeless tobacco: Not on file  . Alcohol Use: 4.2 oz/week    7 Glasses of wine per week     rare wine  . Drug Use: No  .  Sexually Active: Not on file   Other Topics Concern  . 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    Family History  Problem Relation Age of Onset  . Coronary artery disease     Physical Exam: Filed Vitals:   03/07/11 0856  BP: 112/68  Pulse: 60  Height: 5\' 6"  (1.676 m)  Weight: 154 lb (69.854 kg)    GEN- The patient is well appearing, alert and oriented x 3 today.   Head- normocephalic, atraumatic Eyes-  Sclera clear, conjunctiva pink Ears- hearing intact Oropharynx- clear Neck- supple, no JVP Lymph- no cervical lymphadenopathy Lungs- Clear to ausculation bilaterally, normal work of breathing Heart- Regular rate and rhythm, no murmurs, rubs or gallops, PMI not laterally displaced GI- soft, NT, ND, + BS Extremities- no clubbing, cyanosis, or edema  ekg today reveals sinus 60 bpm, PR 206, incomplete RBBB, stable TWI Recent echo is reviewed  Assessment and Plan:

## 2011-03-07 NOTE — Assessment & Plan Note (Signed)
Doing well off of amiodarone He wishes to stop pradaxa and return to asa He is aware that risks for stroke are higher with ASA than pradaxa.  Return in 6 months

## 2011-03-07 NOTE — Patient Instructions (Signed)
Your physician recommends that you schedule a follow-up appointment in: 3 months with Dr Daleen Squibb and 6 months with Dr Johney Frame  Your physician has recommended you make the following change in your medication:  1) Stop Pradaxa 2) Start Aspirin 325mg  daily

## 2011-04-05 ENCOUNTER — Other Ambulatory Visit: Payer: Self-pay

## 2011-04-05 MED ORDER — ATORVASTATIN CALCIUM 10 MG PO TABS: 10.0000 mg | ORAL_TABLET | Freq: Every day | ORAL | Status: DC

## 2011-04-22 ENCOUNTER — Telehealth: Payer: Self-pay | Admitting: Internal Medicine

## 2011-04-22 ENCOUNTER — Ambulatory Visit: Payer: BC Managed Care – PPO | Admitting: *Deleted

## 2011-04-22 DIAGNOSIS — I4891 Unspecified atrial fibrillation: Secondary | ICD-10-CM

## 2011-04-22 MED ORDER — DABIGATRAN ETEXILATE MESYLATE 150 MG PO CAPS: 150.0000 mg | ORAL_CAPSULE | Freq: Two times a day (BID) | ORAL | Status: DC

## 2011-04-22 NOTE — Patient Instructions (Addendum)
Your physician has recommended that you have a Cardioversion (DCCV). Electrical Cardioversion uses a jolt of electricity to your heart either through paddles or wired patches attached to your chest. This is a controlled, usually prescheduled, procedure. Defibrillation is done under light anesthesia in the hospital, and you usually go home the day of the procedure. This is done to get your heart back into a normal rhythm. You are not awake for the procedure. Please see the instruction sheet given to you today.  Will need a TEE guided Cardioversion    Just restarted Pradaxa today

## 2011-04-22 NOTE — Telephone Encounter (Signed)
New msg Pt said he is afib. He wants to talk to you. Please call

## 2011-04-22 NOTE — Telephone Encounter (Signed)
Patient is in afib and will need to restart Pradaxa 150mg  twice daily and set up for DCCV.  Dr Graciela Husbands reviewed the EKG.  Patient aware and I will set him up for Friday afternoon  04/26/11 with Dr Shirlee Latch  He will need to be at the hospital at 12:00pm for a 2:00pm procedure.

## 2011-04-22 NOTE — Telephone Encounter (Signed)
lmom for pt to call me back

## 2011-04-23 ENCOUNTER — Encounter (HOSPITAL_COMMUNITY): Payer: Self-pay | Admitting: Pharmacy Technician

## 2011-04-24 ENCOUNTER — Other Ambulatory Visit: Payer: Self-pay | Admitting: *Deleted

## 2011-04-24 DIAGNOSIS — I4891 Unspecified atrial fibrillation: Secondary | ICD-10-CM

## 2011-04-24 NOTE — Telephone Encounter (Signed)
Set patient up for a TEE guided DCCV and will need to be at the hospital at 12:30.  Do not or eat drink after midnight the night before.  Will also need someone to drive him home  Patient aware  Okay to take his medications with a small sip of water the morning of the procedure

## 2011-04-26 ENCOUNTER — Encounter (HOSPITAL_COMMUNITY): Payer: Self-pay | Admitting: *Deleted

## 2011-04-26 ENCOUNTER — Encounter (HOSPITAL_COMMUNITY)
Admission: RE | Disposition: A | Discharge status: Home / Self Care | Payer: Self-pay | Source: Ambulatory Visit | Attending: Cardiology

## 2011-04-26 ENCOUNTER — Ambulatory Visit (HOSPITAL_COMMUNITY)
Admission: RE | Admit: 2011-04-26 | Discharge: 2011-04-26 | Disposition: A | Discharge status: Home / Self Care | Payer: BC Managed Care – PPO | Source: Ambulatory Visit | Attending: Cardiology | Admitting: Cardiology

## 2011-04-26 ENCOUNTER — Ambulatory Visit (HOSPITAL_COMMUNITY): Payer: BC Managed Care – PPO | Admitting: *Deleted

## 2011-04-26 ENCOUNTER — Other Ambulatory Visit: Payer: Self-pay

## 2011-04-26 DIAGNOSIS — I4891 Unspecified atrial fibrillation: Secondary | ICD-10-CM | POA: Insufficient documentation

## 2011-04-26 HISTORY — PX: CARDIOVERSION: SHX1299

## 2011-04-26 HISTORY — PX: TEE WITHOUT CARDIOVERSION: SHX5443

## 2011-04-26 LAB — BASIC METABOLIC PANEL
BUN: 14 mg/dL (ref 6–23)
CO2: 28 mEq/L (ref 19–32)
Calcium: 9.7 mg/dL (ref 8.4–10.5)
Chloride: 104 mEq/L (ref 96–112)
Creatinine, Ser: 0.76 mg/dL (ref 0.50–1.35)
GFR calc Af Amer: >90 mL/min (ref >90)
GFR calc non Af Amer: >90 mL/min (ref >90)
Glucose, Bld: 86 mg/dL (ref 70–99)
Potassium: 4.2 mEq/L (ref 3.5–5.1)
Sodium: 140 mEq/L (ref 135–145)

## 2011-04-26 LAB — MAGNESIUM: Magnesium: 2 mg/dL (ref 1.5–2.5)

## 2011-04-26 LAB — CBC
HCT: 46.7 % (ref 39.0–52.0)
Hemoglobin: 16.1 g/dL (ref 13.0–17.0)
MCH: 30 pg (ref 26.0–34.0)
MCHC: 34.5 g/dL (ref 30.0–36.0)
MCV: 87 fL (ref 78.0–100.0)
Platelets: 187 10*3/uL (ref 150–400)
RBC: 5.37 MIL/uL (ref 4.22–5.81)
RDW: 12.2 % (ref 11.5–15.5)
WBC: 6.3 10*3/uL (ref 4.0–10.5)

## 2011-04-26 LAB — DIFFERENTIAL
Basophils Absolute: 0 10*3/uL (ref 0.0–0.1)
Basophils Relative: 1 % (ref 0–1)
Eosinophils Absolute: 0.2 10*3/uL (ref 0.0–0.7)
Eosinophils Relative: 3 % (ref 0–5)
Lymphocytes Relative: 39 % (ref 12–46)
Lymphs Abs: 2.5 10*3/uL (ref 0.7–4.0)
Monocytes Absolute: 0.5 10*3/uL (ref 0.1–1.0)
Monocytes Relative: 9 % (ref 3–12)
Neutro Abs: 3.1 10*3/uL (ref 1.7–7.7)
Neutrophils Relative %: 49 % (ref 43–77)

## 2011-04-26 SURGERY — ECHOCARDIOGRAM, TRANSESOPHAGEAL
Anesthesia: General

## 2011-04-26 SURGERY — CARDIOVERSION
Anesthesia: General

## 2011-04-26 SURGERY — Surgical Case
Anesthesia: *Unknown

## 2011-04-26 MED ORDER — SODIUM CHLORIDE 0.9 % IV SOLN: 250.0000 mL | INTRAVENOUS | Status: DC

## 2011-04-26 MED ORDER — HYDROCORTISONE 1 % EX CREA: 1.0000 "application " | TOPICAL_CREAM | Freq: Three times a day (TID) | CUTANEOUS | Status: DC | PRN

## 2011-04-26 MED ORDER — MIDAZOLAM HCL 10 MG/2ML IJ SOLN
10.0000 mg | Freq: Once | INTRAMUSCULAR | Status: AC
  Administered 2011-04-26: 2 mg via INTRAVENOUS

## 2011-04-26 MED ORDER — BENZOCAINE 20 % MT SOLN: 1.0000 "application " | OROMUCOSAL | Status: DC | PRN

## 2011-04-26 MED ORDER — PROPOFOL 10 MG/ML IV BOLUS
INTRAVENOUS | Status: DC | PRN
  Administered 2011-04-26: 50 mg via INTRAVENOUS

## 2011-04-26 MED ORDER — DIPHENHYDRAMINE HCL 50 MG/ML IJ SOLN
INTRAMUSCULAR | Status: AC
Start: 2011-04-26 — End: 2011-04-26
  Filled 2011-04-26: qty 1

## 2011-04-26 MED ORDER — MIDAZOLAM HCL 10 MG/2ML IJ SOLN
INTRAMUSCULAR | Status: AC
  Filled 2011-04-26: qty 2

## 2011-04-26 MED ORDER — FENTANYL CITRATE 0.05 MG/ML IJ SOLN
INTRAMUSCULAR | Status: DC | PRN
  Administered 2011-04-26: 25 ug via INTRAVENOUS

## 2011-04-26 MED ORDER — FENTANYL CITRATE 0.05 MG/ML IJ SOLN
INTRAMUSCULAR | Status: AC
  Filled 2011-04-26: qty 2

## 2011-04-26 MED ORDER — SODIUM CHLORIDE 0.9 % IV SOLN
INTRAVENOUS | Status: DC
  Administered 2011-04-26: 500 mL via INTRAVENOUS

## 2011-04-26 MED ORDER — SODIUM CHLORIDE 0.45 % IV SOLN: INTRAVENOUS | Status: DC

## 2011-04-26 MED ORDER — MIDAZOLAM HCL 10 MG/2ML IJ SOLN: 10.0000 mg | Freq: Once | INTRAMUSCULAR | Status: DC

## 2011-04-26 MED ORDER — FENTANYL CITRATE 0.05 MG/ML IJ SOLN: 250.0000 ug | Freq: Once | INTRAMUSCULAR | Status: DC

## 2011-04-26 MED ORDER — SODIUM CHLORIDE 0.9 % IV SOLN
INTRAVENOUS | Status: DC | PRN
  Administered 2011-04-26: 14:00:00 via INTRAVENOUS

## 2011-04-26 MED ORDER — BENZOCAINE 20 % MT SOLN
1.0000 | OROMUCOSAL | Status: DC | PRN
Start: 2011-04-26 — End: 2011-04-26
  Administered 2011-04-26: 2 via OROMUCOSAL

## 2011-04-26 MED ORDER — SODIUM CHLORIDE 0.9 % IV SOLN: INTRAVENOUS | Status: DC

## 2011-04-26 NOTE — Transfer of Care (Signed)
Immediate Anesthesia Transfer of Care Note  Patient: Ronnie Black  Procedure(s) Performed: Procedure(s) (LRB): TRANSESOPHAGEAL ECHOCARDIOGRAM (TEE) (N/A) CARDIOVERSION (N/A)  Patient Location: PACU and Endoscopy Unit  Anesthesia Type: General  Level of Consciousness: awake, oriented and patient cooperative  Airway & Oxygen Therapy: Patient Spontanous Breathing and Patient connected to nasal cannula oxygen  Post-op Assessment: Report given to PACU RN and Post -op Vital signs reviewed and stable  Post vital signs: Reviewed  Complications: No apparent anesthesia complications

## 2011-04-26 NOTE — Procedures (Signed)
Electrical Cardioversion Procedure Note Ronnie Black [id number] [birth year]  Procedure: Electrical Cardioversion Indications:  Atrial Fibrillation  Procedure Details Consent: Risks of procedure as well as the alternatives and risks of each were explained to the (patient/caregiver).  Consent for procedure obtained. Time Out: Verified patient identification, verified procedure, site/side was marked, verified correct patient position, special equipment/implants available, medications/allergies/relevent history reviewed, required imaging and test results available.  Performed  Patient placed on cardiac monitor, pulse oximetry, supplemental oxygen as necessary.  Sedation given: Propofol IV Pacer pads placed anterior and posterior chest.  Cardioverted 1 time(s).  Cardioverted at 200J.  Evaluation Findings: Post procedure EKG shows: NSR Complications: None Patient did tolerate procedure well.   Marca Ancona 04/26/2011, 1:43 PM

## 2011-04-26 NOTE — H&P (Signed)
   Patient has gone back into atrial fibrillation and has been set up for TEE-guided DCCV by Dr. Johney Frame.  He has been on Pradaxa for 8 doses.   Only change to physical exam compared to prior note by Dr. Johney Frame is irregular rhythm now.   Proceed with TEE-DCCV.   Ronnie Black 04/26/2011 1:21 PM

## 2011-04-26 NOTE — Discharge Instructions (Addendum)
Endoscopy Care After Please read the instructions outlined below and refer to this sheet in the next few weeks. These discharge instructions provide you with general information on caring for yourself after you leave the hospital. Your doctor may also give you specific instructions. While your treatment has been planned according to the most current medical practices available, unavoidable complications occasionally occur. If you have any problems or questions after discharge, please call your doctor. HOME CARE INSTRUCTIONS Activity  You may resume your regular activity but move at a slower pace for the next 24 hours.   Take frequent rest periods for the next 24 hours.   Walking will help expel (get rid of) the air and reduce the bloated feeling in your abdomen.   No driving for 24 hours (because of the anesthesia (medicine) used during the test).   You may shower.   Do not sign any important legal documents or operate any machinery for 24 hours (because of the anesthesia used during the test).  Nutrition  Drink plenty of fluids.   You may resume your normal diet.   Begin with a light meal and progress to your normal diet.   Avoid alcoholic beverages for 24 hours or as instructed by your caregiver.  Medications You may resume your normal medications unless your caregiver tells you otherwise. What you can expect today  You may experience abdominal discomfort such as a feeling of fullness or "gas" pains.   You may experience a sore throat for 2 to 3 days. This is normal. Gargling with salt water may help this.  Follow-up Your doctor will discuss the results of your test with you. SEEK IMMEDIATE MEDICAL CARE IF:  You have excessive nausea (feeling sick to your stomach) and/or vomiting.   You have severe abdominal pain and distention (swelling).   You have trouble swallowing.   You have a temperature over 100 F (37.8 C).   You have rectal bleeding or vomiting of blood.   Document Released: 08/22/2003 Document Revised: 09/19/2010 Document Reviewed: 03/04/2007 [facility] Patient Information 2012 [place], [place]. Transesophageal Echocardiography A transesophageal echocardiogram (TEE) is a special type of test that produces images of the heart by sound waves (echocardiogram). This type of echocardiogram can obtain better images of the heart than a standard echocardiogram. A TEE is done by passing a flexible tube down the esophagus. The heart is located in front of the esophagus. Because the heart and esophagus are close to one another, your caregiver can take very clear, detailed pictures of the heart via ultrasound waves. WHY HAVE A TEE? Your caregiver may need more information based on your medical condition. A TEE is usually performed due to the following:  Your caregiver needs more information based on standard echocardiogram findings.   If you had a stroke, this might have happened because a clot formed in your heart. A TEE can visualize different areas of the heart and check for clots.   To check valve anatomy and function. Your caregiver will especially look at the mitral valve.   To check for redness, soreness, and swelling (inflammation) on the inside lining of the heart (endocarditis).   To evaluate the dividing wall (septum) of the heart and presence of a hole that did not close after birth (patent foramen ovale, PFO).   To help diagnose a tear in the wall of the aorta (aortic dissection).   During cardiac valve surgery, a TEE probe is placed. This allows the surgeon to assess the valve repair before  closing the chest.  LET YOUR CAREGIVER KNOW ABOUT:   Swallowing difficulties.   An esophageal obstruction.   Use of aspirin or antiplatelet therapy.  RISKS AND COMPLICATIONS  Though extremely rare, an esophageal tear (rupture) is a potential complication. BEFORE THE PROCEDURE   Arrive at least 1 hour before the procedure or as told by your caregiver.    Do not eat or drink for 6 hours before the procedure or as told by your caregiver.   An intravenous (IV) access tube will be started in the arm.  PROCEDURE   A medicine to help you relax (sedative) will be given through the IV.   A medicine that numbs the area (local anesthetic) may be sprayed to the back of the throat.   Your blood pressure, heart rate, and breathing (vital signs) will be monitored during the procedure.   The TEE probe is a long, flexible tube. It is about the width of an adult male's index finger. The tip of the probe is placed into the back of the mouth and you will be asked to swallow. This helps to pass the tip of the probe into the esophagus. Once the tip of the probe is in the correct area, your caregiver can take pictures of the heart.   A TEE is usually not a painful procedure. You may feel the probe press against the back of the throat. The probe does not enter the trachea and does not affect your breathing.   Your time spent at the hospital is usually less than 2 hours.  AFTER THE PROCEDURE   You will be in bed, resting until you have fully returned to consciousness.   When you first awaken, your throat may feel slightly sore and will probably still feel numb. This will improve slowly over time.   You will not be allowed to eat or drink until it is clear that numbness has improved.   Once you have been able to drink, urinate, and sit on the edge of the bed without feeling sick to your stomach (nauseous) or dizzy, you may be cleared to dress and go home.   Do not drive yourself home. You have had medications that can continue to make you feel drowsy and can impair your reflexes.   You should have a friend or family member with you for the next 24 hours after your examination.  Obtaining the test results It is your responsibility to obtain your test results. Ask the lab or department performing the test when and how you will get your results. SEEK  IMMEDIATE MEDICAL CARE IF:   There is chest pain.   You have a hard time breathing or have shortness of breath.   You cough or throw up (vomit) blood.  MAKE SURE YOU:   Understand these instructions.   Will watch this condition.   Will get help right away if you is not doing well or gets worse.  Document Released: 03/30/2002 Document Revised: 12/27/2010 Document Reviewed: 06/21/2008 [facility] Patient Information 2012 [place], [place].

## 2011-04-26 NOTE — Progress Notes (Signed)
  Echocardiogram Echocardiogram Transesophageal has been performed.  Keandre Linden, Real Cons 04/26/2011, 1:43 PM

## 2011-04-26 NOTE — Procedures (Addendum)
Procedure: TEE  Indication: Atrial fibrillation.  Patient has been on Pradaxa x 8 doses.  Sedation: Versed 2 mg IV, Fentanyl 25 mcg IV  Procedure note: Please see echo section for full report.  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  EF 55-60%.  No LA appendage thrombus.   No complications.   OK to proceed to DCCV.

## 2011-04-26 NOTE — Anesthesia Postprocedure Evaluation (Signed)
  Anesthesia Post-op Note  Patient: Ronnie Black  Procedure(s) Performed: Procedure(s) (LRB): TRANSESOPHAGEAL ECHOCARDIOGRAM (TEE) (N/A) CARDIOVERSION (N/A)  Patient Location: PACU and Endoscopy Unit  Anesthesia Type: General  Level of Consciousness: awake, alert  and oriented  Airway and Oxygen Therapy: Patient Spontanous Breathing  Post-op Pain: none  Post-op Assessment: Post-op Vital signs reviewed  Post-op Vital Signs: stable  Complications: No apparent anesthesia complications

## 2011-04-26 NOTE — Preoperative (Signed)
Beta Blockers   Reason not to administer Beta Blockers:Not Applicable 

## 2011-04-26 NOTE — Anesthesia Preprocedure Evaluation (Signed)
Anesthesia Evaluation  Patient identified by MRN, date of birth, ID band Patient awake    Airway       Dental  (+) Teeth Intact   Pulmonary  breath sounds clear to auscultation        Cardiovascular Rhythm:Irregular Rate:Normal     Neuro/Psych    GI/Hepatic   Endo/Other    Renal/GU      Musculoskeletal   Abdominal   Peds  Hematology   Anesthesia Other Findings   Reproductive/Obstetrics                           Anesthesia Physical Anesthesia Plan  ASA: III  Anesthesia Plan: General   Post-op Pain Management:    Induction: Intravenous  Airway Management Planned: Mask  Additional Equipment:   Intra-op Plan:   Post-operative Plan:   Informed Consent: I have reviewed the patients History and Physical, chart, labs and discussed the procedure including the risks, benefits and alternatives for the proposed anesthesia with the patient or authorized representative who has indicated his/her understanding and acceptance.     Plan Discussed with: CRNA and Surgeon  Anesthesia Plan Comments:         Anesthesia Quick Evaluation

## 2011-04-29 ENCOUNTER — Encounter (HOSPITAL_COMMUNITY): Payer: Self-pay | Admitting: Cardiology

## 2011-05-05 ENCOUNTER — Other Ambulatory Visit: Payer: Self-pay | Admitting: Cardiology

## 2011-06-26 ENCOUNTER — Ambulatory Visit: Payer: BC Managed Care – PPO | Admitting: Cardiology

## 2011-06-28 ENCOUNTER — Ambulatory Visit (INDEPENDENT_AMBULATORY_CARE_PROVIDER_SITE_OTHER): Payer: BC Managed Care – PPO | Admitting: Cardiology

## 2011-06-28 ENCOUNTER — Encounter: Payer: Self-pay | Admitting: Cardiology

## 2011-06-28 VITALS — BP 126/78 | HR 66 | Ht 66.0 in | Wt 156.0 lb

## 2011-06-28 DIAGNOSIS — I4891 Unspecified atrial fibrillation: Secondary | ICD-10-CM

## 2011-06-28 NOTE — Patient Instructions (Signed)
Your physician wants you to follow-up in: 6 months with Dr Allred You will receive a reminder letter in the mail two months in advance. If you don't receive a letter, please call our office to schedule the follow-up appointment.  Your physician recommends that you continue on your current medications as directed. Please refer to the Current Medication list given to you today.   

## 2011-06-28 NOTE — Progress Notes (Signed)
HPI Mr Netherton returns today for evaluation and management of his paroxysmal A. fib. He went back in atrial fibrillation April. He has had previous ablation by Dr. Johney Frame. He had successful cardioversion as an outpatient and other than some intermittent palpitations has done well.  He's cut his dose of Niaspan to 500 mg because he cannot take this without flushing thousand milligrams un less he takes an aspirin. He had been on 325 mg per night  before. He is afraid of bleeding.  Past Medical History  Diagnosis Date  . Hypertrophic obstructive cardiomyopathy     without significant obstruction  . Persistent atrial fibrillation     s/p PVI 3/12  . HLD (hyperlipidemia)   . DDD (degenerative disc disease)     Current Outpatient Prescriptions  Medication Sig Dispense Refill  . atorvastatin (LIPITOR) 10 MG tablet Take 10 mg by mouth daily.      . cholecalciferol (VITAMIN D) 1000 UNITS tablet Take 1,000 Units by mouth daily.      . dabigatran (PRADAXA) 150 MG CAPS Take 150 mg by mouth every 12 (twelve) hours.      . fish oil-omega-3 fatty acids 1000 MG capsule Take 1 g by mouth daily.       Marland Kitchen loratadine (CLARITIN) 10 MG tablet Take 10 mg by mouth daily as needed. For allergies      . metoprolol tartrate (LOPRESSOR) 25 MG tablet TAKE 1 TABLET BY MOUTH TWICE A DAY  60 tablet  3  . Multiple Vitamin (MULTIVITAMIN) tablet Take 1 tablet by mouth daily.        . niacin (NIASPAN) 1000 MG CR tablet Take 500 mg by mouth at bedtime.       Marland Kitchen DISCONTD: metoprolol tartrate (LOPRESSOR) 25 MG tablet Take 25 mg by mouth 2 (two) times daily.        Not on File  Family History  Problem Relation Age of Onset  . Coronary artery disease      History   Social History  . Marital Status: Married    Spouse Name: N/A    Number of Children: N/A  . Years of Education: N/A   Occupational History  . professor [employer]   Social History Main Topics  . Smoking status: Former Smoker -- 1.0 packs/day for 15 years   Types: Cigarettes    Quit date: 11/28/1975  . Smokeless tobacco: Not on file  . Alcohol Use: 3.6 oz/week    3 Cans of beer, 3 Glasses of wine per week     rare wine  . Drug Use: No  . Sexually Active: Yes   Other Topics Concern  . 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    ROS ALL NEGATIVE EXCEPT THOSE NOTED IN HPI  PE  General Appearance: well developed, well nourished in no acute distress HEENT: symmetrical face, PERRLA, good dentition  Neck: no JVD, thyromegaly, or adenopathy, trachea midline Chest: symmetric without deformity Cardiac: PMI non-displaced, RRR, normal S1, S2, no gallop or murmur Lung: clear to ausculation and percussion Vascular: all pulses full without bruits  Abdominal: nondistended, nontender, good bowel sounds, no HSM, no bruits Extremities: no cyanosis, clubbing or edema, no sign of DVT, no varicosities  Skin: normal color, no rashes Neuro: alert and oriented x 3, non-focal Pysch: normal affect  EKG Normal sinus rhythm with PACs, RSR prime, ST segment changes, no acute changes BMET    Component  Value Date/Time   NA 140 04/26/2011 1303   K 4.2 04/26/2011 1303   CL 104 04/26/2011 1303   CO2 28 04/26/2011 1303   GLUCOSE 86 04/26/2011 1303   BUN 14 04/26/2011 1303   CREATININE 0.76 04/26/2011 1303   CREATININE 0.84 04/20/2010 1614   CALCIUM 9.7 04/26/2011 1303   GFRNONAA >90 04/26/2011 1303   GFRAA >90 04/26/2011 1303    Lipid Panel  No results found for this basename: chol, trig, hdl, cholhdl, vldl, ldlcalc    CBC    Component Value Date/Time   WBC 6.3 04/26/2011 1303   RBC 5.37 04/26/2011 1303   HGB 16.1 04/26/2011 1303   HCT 46.7 04/26/2011 1303   PLT 187 04/26/2011 1303   MCV 87.0 04/26/2011 1303   MCH 30.0 04/26/2011 1303   MCHC 34.5 04/26/2011 1303   RDW 12.2 04/26/2011 1303   LYMPHSABS 2.5 04/26/2011 1303   MONOABS 0.5 04/26/2011 1303   EOSABS 0.2 04/26/2011 1303   BASOSABS 0.0 04/26/2011 1303

## 2011-06-28 NOTE — Assessment & Plan Note (Signed)
He is maintaining sinus rhythm after having outpatient cardioversion. He'll remain on anticoagulation and continue not to take aspirin for fear of bleeding. He will have his lipids checked by primary care and sent to [name]. I will turn his care over to Dr. Johney Frame. We'll arrange followup with him in about 6 months.

## 2011-08-02 ENCOUNTER — Telehealth: Payer: Self-pay | Admitting: Internal Medicine

## 2011-08-02 NOTE — Telephone Encounter (Signed)
New msg Pt wants to talk to you about his records being sent to his pcp

## 2011-08-23 ENCOUNTER — Telehealth: Payer: Self-pay | Admitting: Cardiology

## 2011-08-23 NOTE — Telephone Encounter (Signed)
Please return call to patient [phone]

## 2011-08-23 NOTE — Telephone Encounter (Signed)
Spoke with pt. Pt states he stopped aspirin when he started pradaxa in the beginning of 2012. He had to decrease niaspan to 500mg  at night at that time due flushing since he was not taking aspirin anymore. Recent lipid at PCP: 08/01/11  T Chol 160  HDL 50  LDL 89  VLDL 21  Chol/HDL 3.2   Trig 103. Pt is asking if Dr Daleen Squibb feels he needs to try to increase niaspan back to 1000mg  at night based on this recent lab result. Pt is satisfied with continuing niaspan 500mg  hs. He would like Dr Vern Claude recommendation,though.  I will forward to Dr Daleen Squibb for review and recommendations.

## 2011-08-24 ENCOUNTER — Other Ambulatory Visit: Payer: Self-pay | Admitting: Cardiology

## 2011-08-27 ENCOUNTER — Other Ambulatory Visit: Payer: Self-pay | Admitting: *Deleted

## 2011-08-27 NOTE — Telephone Encounter (Signed)
Opened in Error.

## 2011-08-27 NOTE — Telephone Encounter (Signed)
Continue with 500 mg of Niaspan per night. His numbers look pretty good and does not need to be on aspirin.

## 2011-08-27 NOTE — Telephone Encounter (Signed)
Spoke with pt's wife and she is aware of Dr Vern Claude recommendations. She will tell pt.

## 2011-08-28 ENCOUNTER — Other Ambulatory Visit: Payer: Self-pay | Admitting: *Deleted

## 2011-08-28 MED ORDER — DABIGATRAN ETEXILATE MESYLATE 150 MG PO CAPS: 150.0000 mg | ORAL_CAPSULE | Freq: Two times a day (BID) | ORAL | Status: DC

## 2011-08-29 ENCOUNTER — Other Ambulatory Visit: Payer: Self-pay | Admitting: *Deleted

## 2011-08-29 MED ORDER — DABIGATRAN ETEXILATE MESYLATE 150 MG PO CAPS: 150.0000 mg | ORAL_CAPSULE | Freq: Two times a day (BID) | ORAL | Status: DC

## 2011-08-29 NOTE — Telephone Encounter (Signed)
Refilled pradaxa

## 2011-09-06 ENCOUNTER — Other Ambulatory Visit: Payer: Self-pay | Admitting: Cardiology

## 2011-11-19 ENCOUNTER — Emergency Department (INDEPENDENT_AMBULATORY_CARE_PROVIDER_SITE_OTHER): Payer: BC Managed Care – PPO

## 2011-11-19 ENCOUNTER — Emergency Department (HOSPITAL_COMMUNITY)
Admission: EM | Admit: 2011-11-19 | Discharge: 2011-11-19 | Disposition: A | Discharge status: Home / Self Care | Payer: BC Managed Care – PPO | Source: Home / Self Care | Attending: Emergency Medicine | Admitting: Emergency Medicine

## 2011-11-19 ENCOUNTER — Encounter (HOSPITAL_COMMUNITY): Payer: Self-pay | Admitting: Emergency Medicine

## 2011-11-19 DIAGNOSIS — J189 Pneumonia, unspecified organism: Secondary | ICD-10-CM

## 2011-11-19 LAB — POCT RAPID STREP A: Streptococcus, Group A Screen (Direct): NEGATIVE

## 2011-11-19 MED ORDER — CEFTRIAXONE SODIUM 1 G IJ SOLR
1.0000 g | Freq: Once | INTRAMUSCULAR | Status: AC
  Administered 2011-11-19: 1 g via INTRAMUSCULAR

## 2011-11-19 MED ORDER — LIDOCAINE HCL (PF) 1 % IJ SOLN
INTRAMUSCULAR | Status: AC
  Filled 2011-11-19: qty 5

## 2011-11-19 MED ORDER — CEFTRIAXONE SODIUM 1 G IJ SOLR
INTRAMUSCULAR | Status: AC
  Filled 2011-11-19: qty 10

## 2011-11-19 MED ORDER — AZITHROMYCIN 250 MG PO TABS: ORAL_TABLET | ORAL | Status: DC

## 2011-11-19 NOTE — ED Notes (Signed)
Pt c/o cold sx x7 days... Sx include: fever, sore throat, dry cough... Denies: vomiting, nausea, diarrhea, congestion.... Has taken OTC cold meds: Tylenol, loratadine w/little relief... Pt is alert w/no signs of distress.

## 2011-11-19 NOTE — ED Notes (Signed)
Pt. instructed to wait for 15 min. after injection. Pt. wants to wait in the waiting room.  Pt. told symptoms to watch for and to call me before leaving.  Pt. agrees.

## 2011-11-19 NOTE — ED Provider Notes (Signed)
Chief Complaint  Patient presents with  . Sore Throat    History of Present Illness:   Ronnie Black is a 66 year old male who presents today with a one-week history of sore throat and dry cough. He denies any headache, nasal congestion, rhinorrhea, chest pain, shortness of breath, or wheezing. He has felt run down, tired, lacks energy, and feels achy all over. He's had a temperature of 100-101. He denies any GI complaints.  Review of Systems:  Other than noted above, the patient denies any of the following symptoms. Systemic:  No fever, chills, sweats, fatigue, myalgias, headache, or anorexia. Eye:  No redness, pain or drainage. ENT:  No earache, ear congestion, nasal congestion, sneezing, rhinorrhea, sinus pressure, sinus pain, post nasal drip, or sore throat. Lungs:  No cough, sputum production, wheezing, shortness of breath, or chest pain. GI:  No abdominal pain, nausea, vomiting, or diarrhea.  PMFSH:  Past medical history, family history, social history, meds, and allergies were reviewed.  Physical Exam:   Vital signs:  There were no vitals taken for this visit. General:  Alert, in no distress. Eye:  No conjunctival injection or drainage. Lids were normal. ENT:  TMs and canals were normal, without erythema or inflammation.  Nasal mucosa was clear and uncongested, without drainage.  Mucous membranes were moist.  Pharynx was clear, without exudate or drainage.  There were no oral ulcerations or lesions. Neck:  Supple, no adenopathy, tenderness or mass. Lungs:  No respiratory distress.  He has minimal wheezes at both bases which clear with deep inspiration, no rales or rhonchi.  Heart:  Regular rhythm, without gallops, murmers or rubs. Skin:  Clear, warm, and dry, without rash or lesions.  Labs:   Results for orders placed during the hospital encounter of 11/19/11  POCT RAPID STREP A ([facility] ONLY)      Component Value Range   Streptococcus, Group A Screen (Direct) NEGATIVE  NEGATIVE     Radiology:  Dg Chest 2 View  11/19/2011  *RADIOLOGY REPORT*  Clinical Data: Cough and fever.  CHEST - 2 VIEW  Comparison: Two-view chest 06/06/2010.  Findings: The heart size is normal.  Emphysematous changes are again noted.  Mild right lower lobe airspace disease is evident. The upper lungs are otherwise clear.  IMPRESSION:  1.  Mild right lower lobe airspace disease is concerning for early infection. 2.  Emphysema.   Original Report Authenticated By: Jamesetta Orleans. MATTERN, M.D.      I reviewed the images independently and personally and concur with the radiologist's findings.  Course in Urgent Care Center:   He was given Rocephin 1 g IM and tolerated this well without any immediate side effects.  Assessment:  The encounter diagnosis was Community acquired pneumonia.  Plan:   1.  The following meds were prescribed:   New Prescriptions   AZITHROMYCIN (ZITHROMAX) 250 MG TABLET    2 tabs on day 1 then 1 daily for 9 more days.   2.  The patient was instructed in symptomatic care and handouts were given. 3.  The patient was told to return if becoming worse in any way, in 2 days for recheck, and given some red flag symptoms that would indicate earlier return. I also suggested to him that he repeat a chest x-ray a month to confirm complete clearing of the pneumonia.   Ronnie Likes, MD 11/19/11 [phone]

## 2011-11-21 ENCOUNTER — Emergency Department (INDEPENDENT_AMBULATORY_CARE_PROVIDER_SITE_OTHER)
Admission: EM | Admit: 2011-11-21 | Discharge: 2011-11-21 | Disposition: A | Discharge status: Home / Self Care | Payer: BC Managed Care – PPO | Source: Home / Self Care | Attending: Emergency Medicine | Admitting: Emergency Medicine

## 2011-11-21 ENCOUNTER — Encounter (HOSPITAL_COMMUNITY): Payer: Self-pay | Admitting: Emergency Medicine

## 2011-11-21 DIAGNOSIS — J189 Pneumonia, unspecified organism: Secondary | ICD-10-CM

## 2011-11-21 NOTE — ED Notes (Signed)
Reports diagnosed with pneumonia on Tuesday 10/29.  Told to return today for recheck.  Patient reports he is feeling better.patient is able to eat and drink without difficulty.  Patient continues to run fever intermittently

## 2011-11-21 NOTE — ED Provider Notes (Signed)
Chief Complaint  Patient presents with  . Pneumonia    History of Present Illness:   The patient is a 66 year old male who returns today for scheduled followup for pneumonia. He was here 2 days ago with cough, fatigue, and malaise. A chest x-ray demonstrated a minimal right lower lobe infiltrate. He was given IM Rocephin and sent home on by mouth azithromycin for 10 days. He states he feels better today. He still has a minimal dry cough. Last night he had a temperature 100.6 but today is down to normal. He denies any chest pain or shortness of breath. His energy level is coming back of his appetite is good. He denies any headaches, nasal congestion, rhinorrhea, or sore throat. He's had no abdominal pain, nausea, or vomiting.  Review of Systems:  Other than noted above, the patient denies any of the following symptoms. Systemic:  No fever, chills, sweats, fatigue, myalgias, headache, or anorexia. Eye:  No redness, pain or drainage. ENT:  No earache, ear congestion, nasal congestion, sneezing, rhinorrhea, sinus pressure, sinus pain, post nasal drip, or sore throat. Lungs:  No cough, sputum production, wheezing, shortness of breath, or chest pain. GI:  No abdominal pain, nausea, vomiting, or diarrhea.  PMFSH:  Past medical history, family history, social history, meds, and allergies were reviewed.  Physical Exam:   Vital signs:  BP 123/75  Pulse 70  Temp 99.5 F (37.5 C) (Oral)  Resp 16  SpO2 97% General:  Alert, in no distress. Eye:  No conjunctival injection or drainage. Lids were normal. ENT:  TMs and canals were normal, without erythema or inflammation.  Nasal mucosa was clear and uncongested, without drainage.  Mucous membranes were moist.  Pharynx was clear, without exudate or drainage.  There were no oral ulcerations or lesions. Neck:  Supple, no adenopathy, tenderness or mass. Lungs:  No respiratory distress.  He has minimal, scattered inspiratory rhonchi, no rales or wheezes.    Heart:  Regular rhythm, without gallops, murmers or rubs. Skin:  Clear, warm, and dry, without rash or lesions.  Assessment:  The encounter diagnosis was Community acquired pneumonia.  This appears to be improving clinically. I have encouraged him to finish up his prescription of antibiotics and he may return to normal activity as he tolerates it. I suggested he return in a month for a follow up chest x-ray.  Plan:   1.  The following meds were prescribed:   New Prescriptions   No medications on file   2.  The patient was instructed in symptomatic care and handouts were given. 3.  The patient was told to return if becoming worse in any way, in a month for repeat chest x-ray, and given some red flag symptoms that would indicate earlier return.   [name], MD 11/21/11 [phone]

## 2011-11-26 ENCOUNTER — Encounter (HOSPITAL_COMMUNITY): Payer: Self-pay | Admitting: Emergency Medicine

## 2011-12-05 ENCOUNTER — Telehealth: Payer: Self-pay | Admitting: Internal Medicine

## 2011-12-05 NOTE — Telephone Encounter (Signed)
Patient is having a Colonoscopy  on 01/02/12  Need to stop Pradaxa and ASA prior.  How many days prior to and when to restart.  Fax [phone] Hermelinda Dellen

## 2011-12-05 NOTE — Telephone Encounter (Signed)
Ronnie Black with [facility] medical calling re this pt, pls call

## 2011-12-05 NOTE — Telephone Encounter (Signed)
Called and lmom for Selena Batten to return my call in regards to patient

## 2011-12-12 ENCOUNTER — Encounter: Payer: Self-pay | Admitting: Internal Medicine

## 2011-12-12 NOTE — Telephone Encounter (Signed)
This encounter was created in error - please disregard.

## 2011-12-12 NOTE — Telephone Encounter (Signed)
Paulene Floor 12/12/2011 8:45 AM Signed  New Problem:  Called in wanting to speak with you about receiving a written release for the patient to stop his dabigatran (PRADAXA) 150 MG CAPS prior to his colonoscopy and when to restart it. Their fax # [phone]. Please call back if you have any questions.

## 2011-12-12 NOTE — Telephone Encounter (Signed)
New Problem:    Called in wanting to speak with you about receiving a written release for the patient to stop his dabigatran (PRADAXA) 150 MG CAPS prior to his colonoscopy and when to restart it.  Their fax # [phone].  Please call back if you have any questions.

## 2011-12-12 NOTE — Telephone Encounter (Signed)
Per Dr Johney Frame to hold Pradaxa for 48 hours, stop ASA all together and may restart the same day if no polyps taking out.  If polyps taking out may restart Pradaxa 24 hours after procedure

## 2011-12-17 ENCOUNTER — Encounter (HOSPITAL_COMMUNITY): Payer: Self-pay | Admitting: Emergency Medicine

## 2011-12-17 ENCOUNTER — Emergency Department (INDEPENDENT_AMBULATORY_CARE_PROVIDER_SITE_OTHER): Payer: BC Managed Care – PPO

## 2011-12-17 ENCOUNTER — Emergency Department (HOSPITAL_COMMUNITY)
Admission: EM | Admit: 2011-12-17 | Discharge: 2011-12-17 | Disposition: A | Discharge status: Home / Self Care | Payer: BC Managed Care – PPO | Source: Home / Self Care | Attending: Family Medicine | Admitting: Family Medicine

## 2011-12-17 DIAGNOSIS — R05 Cough: Secondary | ICD-10-CM

## 2011-12-17 DIAGNOSIS — R059 Cough, unspecified: Secondary | ICD-10-CM

## 2011-12-17 DIAGNOSIS — J40 Bronchitis, not specified as acute or chronic: Secondary | ICD-10-CM

## 2011-12-17 MED ORDER — FLUTICASONE PROPIONATE HFA 110 MCG/ACT IN AERO: 1.0000 | INHALATION_SPRAY | Freq: Two times a day (BID) | RESPIRATORY_TRACT | Status: DC

## 2011-12-17 NOTE — ED Provider Notes (Signed)
History     CSN: [id number]  Arrival date & time 12/17/11  7829   First MD Initiated Contact with Patient 12/17/11 [phone]      Chief Complaint  Patient presents with  . Follow-up    (Consider location/radiation/quality/duration/timing/severity/associated sxs/prior treatment) HPI Comments: 66 year old male with history of atrial fibrillation status post ablation on anticoagulation with pradaxa. Here for pneumonia followup. Patient was diagnosed with right lower lobe pneumonia on October 29 was treated with Rocephin and a azithromycin. Was asked to come in a month for followup. Patient reports  hes symptoms resolved. Energy level is normal, good appetite. No chest pain or discomfort. She does report sporadic dry cough. Denies nocturnal cough. Reports mild postnasal drip. Denies wheezing. Patient report he smoked for about 10 years and quit when he was in his 30s. No abdominal pain or distention no leg swelling. No PND.    Past Medical History  Diagnosis Date  . Hypertrophic obstructive cardiomyopathy     without significant obstruction  . Persistent atrial fibrillation     s/p PVI 3/12  . HLD (hyperlipidemia)   . DDD (degenerative disc disease)     Past Surgical History  Procedure Date  . Appendectomy   . Atrial fibrillation ablation 03/27/10    PVI with CTI ablation by JA  . Wisdom tooth extraction   . Skin cancer excision   . Cardioversion   . Tee without cardioversion 04/26/2011    Procedure: TRANSESOPHAGEAL ECHOCARDIOGRAM (TEE);  Surgeon: Laurey Morale, MD;  Location: [facility] ENDOSCOPY;  Service: Cardiovascular;  Laterality: N/A;  to be done at 1330  . Cardioversion 04/26/2011    Procedure: CARDIOVERSION;  Surgeon: Laurey Morale, MD;  Location: [facility] ENDOSCOPY;  Service: Cardiovascular;  Laterality: N/A;    Family History  Problem Relation Age of Onset  . Coronary artery disease      History  Substance Use Topics  . Smoking status: Former Smoker -- 1.0 packs/day for 15 years      Types: Cigarettes    Quit date: 11/28/1975  . Smokeless tobacco: Not on file  . Alcohol Use: 3.6 oz/week    3 Cans of beer, 3 Glasses of wine per week     Comment: rare wine      Review of Systems  Constitutional: Negative for fever, chills, diaphoresis, activity change, appetite change, fatigue and unexpected weight change.  HENT: Positive for postnasal drip. Negative for sore throat.   Respiratory: Positive for cough. Negative for shortness of breath and wheezing.   Cardiovascular: Negative for chest pain and leg swelling.  Gastrointestinal: Negative for nausea, vomiting, abdominal pain and diarrhea.  Musculoskeletal: Negative for myalgias and arthralgias.  Neurological: Negative for dizziness and headaches.  All other systems reviewed and are negative.    Allergies  Review of patient's allergies indicates no known allergies.  Home Medications   Current Outpatient Rx  Name  Route  Sig  Dispense  Refill  . ATORVASTATIN CALCIUM 10 MG PO TABS   Oral   Take 10 mg by mouth daily.         . ATORVASTATIN CALCIUM 10 MG PO TABS      take 1 tablet by mouth once daily   30 tablet   5   . AZITHROMYCIN 250 MG PO TABS      2 tabs on day 1 then 1 daily for 9 more days.   11 tablet   0   . DABIGATRAN ETEXILATE MESYLATE 150 MG PO CAPS  Oral   Take 1 capsule (150 mg total) by mouth every 12 (twelve) hours.   180 capsule   3   . OMEGA-3 FATTY ACIDS 1000 MG PO CAPS   Oral   Take 1 g by mouth daily.          Marland Kitchen METOPROLOL TARTRATE 25 MG PO TABS      TAKE 1 TABLET BY MOUTH TWICE A DAY   60 tablet   5   . ONE-DAILY MULTI VITAMINS PO TABS   Oral   Take 1 tablet by mouth daily.           Marland Kitchen NIACIN ER (ANTIHYPERLIPIDEMIC) 1000 MG PO TBCR   Oral   Take 500 mg by mouth at bedtime.          Marland Kitchen VITAMIN D 1000 UNITS PO TABS   Oral   Take 1,000 Units by mouth daily.         Marland Kitchen FLUTICASONE PROPIONATE  HFA 110 MCG/ACT IN AERO   Inhalation   Inhale 1 puff into the  lungs 2 (two) times daily.   1 Inhaler   0   . LORATADINE 10 MG PO TABS   Oral   Take 10 mg by mouth daily as needed. For allergies           BP 143/81  Pulse 76  Temp 98.5 F (36.9 C) (Oral)  Resp 20  SpO2 100%  Physical Exam  Nursing note and vitals reviewed. Constitutional: He is oriented to person, place, and time. He appears well-developed and well-nourished. No distress.  HENT:  Head: Normocephalic and atraumatic.  Right Ear: External ear normal.  Left Ear: External ear normal.  Nose: Nose normal.  Mouth/Throat: Oropharynx is clear and moist. No oropharyngeal exudate.  Eyes: Conjunctivae normal and EOM are normal. Pupils are equal, round, and reactive to light. Right eye exhibits no discharge. Left eye exhibits no discharge. No scleral icterus.  Neck: Neck supple. No JVD present. No thyromegaly present.  Cardiovascular: Normal rate and normal heart sounds.  Exam reveals no gallop and no friction rub.        Irregular heartbeat No low extremity edema  Pulmonary/Chest: Effort normal and breath sounds normal. No respiratory distress. He has no wheezes. He has no rales. He exhibits no tenderness.  Lymphadenopathy:    He has no cervical adenopathy.  Neurological: He is alert and oriented to person, place, and time.  Skin: No rash noted. He is not diaphoretic.    ED Course  Procedures (including critical care time)  Labs Reviewed - No data to display Dg Chest 2 View  12/17/2011  *RADIOLOGY REPORT*  Clinical Data: Follow up pneumonia  CHEST - 2 VIEW  Comparison: 11/19/2011  Findings: Cardiomediastinal silhouette is stable.  Mild hyperinflation again noted.  No segmental infiltrate or pulmonary edema.  Slight improvement in aeration.  Residual mild right infrahilar increased bronchial markings probable due to scarring or residual bronchitic changes.  IMPRESSION: Mild hyperinflation again noted.  No segmental infiltrate or pulmonary edema.  Slight improvement in aeration.   Residual mild right infrahilar increased bronchial markings probable due to scarring or residual bronchitic changes.   Original Report Authenticated By: Natasha Mead, M.D.      1. Cough   2. Bronchitis       MDM  Patient clinically well. Pressure and lung exam. No much change radiographically. My impression is that patient has bronchitic changes in his x-rays. No nodules on x-rays. I do not  think he has an infectious process currently. Decided to treat with Flovent twice a day for 2 or 3 weeks. He will continue to take loratadine as previously prescribed. Asked to followup with his primary care provider in 2-3 weeks to monitor his symptoms and to discuss if imaging followup is required.        Sharin Grave, MD 12/17/11 1038

## 2011-12-17 NOTE — ED Notes (Signed)
Pt is here for a f/u for his pneumonia... Was seen by Dr. Lorenz Coaster on 11/19/11 and on 11/21/11 and was told by Dr. Lorenz Coaster that he would need to f/u in a month after meds to have x-ray done to see if pneumonia has resolved ... Pt has finished his antibiotics and feels much better.... Still c/o a dry cough but other than, reports no other symptoms... Ronnie Black.. Pt is alert w/no signs of distress.

## 2011-12-25 ENCOUNTER — Ambulatory Visit (INDEPENDENT_AMBULATORY_CARE_PROVIDER_SITE_OTHER): Payer: BC Managed Care – PPO | Admitting: Internal Medicine

## 2011-12-25 ENCOUNTER — Encounter: Payer: Self-pay | Admitting: Internal Medicine

## 2011-12-25 ENCOUNTER — Telehealth: Payer: Self-pay | Admitting: Internal Medicine

## 2011-12-25 VITALS — BP 132/74 | HR 56 | Ht 66.0 in | Wt 154.0 lb

## 2011-12-25 DIAGNOSIS — I4891 Unspecified atrial fibrillation: Secondary | ICD-10-CM

## 2011-12-25 MED ORDER — ASPIRIN EC 325 MG PO TBEC: 325.0000 mg | DELAYED_RELEASE_TABLET | Freq: Every day | ORAL | Status: DC

## 2011-12-25 MED ORDER — ATORVASTATIN CALCIUM 10 MG PO TABS: 10.0000 mg | ORAL_TABLET | Freq: Every day | ORAL | Status: DC

## 2011-12-25 MED ORDER — METOPROLOL TARTRATE 25 MG PO TABS: 25.0000 mg | ORAL_TABLET | Freq: Two times a day (BID) | ORAL | Status: DC

## 2011-12-25 MED ORDER — NIACIN ER 500 MG PO CPCR: 500.0000 mg | ORAL_CAPSULE | Freq: Every day | ORAL | Status: DC

## 2011-12-25 MED ORDER — NIACIN ER (ANTIHYPERLIPIDEMIC) 1000 MG PO TBCR: 500.0000 mg | EXTENDED_RELEASE_TABLET | Freq: Every day | ORAL | Status: DC

## 2011-12-25 MED ORDER — DABIGATRAN ETEXILATE MESYLATE 150 MG PO CAPS: 150.0000 mg | ORAL_CAPSULE | Freq: Two times a day (BID) | ORAL | Status: DC

## 2011-12-25 NOTE — Telephone Encounter (Signed)
Pradaxa was stopped today Will start ASA 325 mg  No need to hold prior to colonoscopy

## 2011-12-25 NOTE — Assessment & Plan Note (Signed)
Well controlled off of AAD post ablation He wishes to stop pradaxa and restart asa  Return in 6 months

## 2011-12-25 NOTE — Telephone Encounter (Signed)
Pt was in today and has questions re medications and a colonscopy he has next week

## 2011-12-25 NOTE — Patient Instructions (Addendum)
Your physician wants you to follow-up in: 6 months with Dr Jacquiline Doe will receive a reminder letter in the mail two months in advance. If you don't receive a letter, please call our office to schedule the follow-up appointment.  Your physician has recommended you make the following change in your medication:  1) Stop Pradaxa  2 Start Aspirin 325mg  daily

## 2011-12-25 NOTE — Progress Notes (Signed)
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  Since last being seen in our clinic, the patient reports doing very well.  He had one episode of afib in April requiring cardioversion but no symptoms of afib since.  He has rare pacs.  Today, he denies symptoms of chest pain, shortness of breath,  lower extremity edema, dizziness, presyncope, or syncope.  The patient is otherwise without complaint today.   Past Medical History  Diagnosis Date  . Hypertrophic obstructive cardiomyopathy     without significant obstruction  . Persistent atrial fibrillation     s/p PVI 3/12  . HLD (hyperlipidemia)   . DDD (degenerative disc disease)    Past Surgical History  Procedure Date  . Appendectomy   . Atrial fibrillation ablation 03/27/10    PVI with CTI ablation by JA  . Wisdom tooth extraction   . Skin cancer excision   . Cardioversion   . Tee without cardioversion 04/26/2011    Procedure: TRANSESOPHAGEAL ECHOCARDIOGRAM (TEE);  Surgeon: Laurey Morale, MD;  Location: [facility] ENDOSCOPY;  Service: Cardiovascular;  Laterality: N/A;  to be done at 1330  . Cardioversion 04/26/2011    Procedure: CARDIOVERSION;  Surgeon: Laurey Morale, MD;  Location: [facility] ENDOSCOPY;  Service: Cardiovascular;  Laterality: N/A;    Current Outpatient Prescriptions  Medication Sig Dispense Refill  . atorvastatin (LIPITOR) 10 MG tablet take 1 tablet by mouth once daily  30 tablet  5  . dabigatran (PRADAXA) 150 MG CAPS Take 1 capsule (150 mg total) by mouth every 12 (twelve) hours.  180 capsule  3  . fish oil-omega-3 fatty acids 1000 MG capsule Take 1 g by mouth daily.       . fluticasone (FLOVENT HFA) 110 MCG/ACT inhaler Inhale 1 puff into the lungs 2 (two) times daily.  1 Inhaler  0  . loratadine (CLARITIN) 10 MG tablet Take 10 mg by mouth daily as needed. For allergies      . metoprolol tartrate (LOPRESSOR) 25 MG tablet TAKE 1  TABLET BY MOUTH TWICE A DAY  60 tablet  5  . Multiple Vitamin (MULTIVITAMIN) tablet Take 1 tablet by mouth daily.        . niacin (NIASPAN) 1000 MG CR tablet Take 500 mg by mouth at bedtime.       Marland Kitchen atorvastatin (LIPITOR) 10 MG tablet Take 10 mg by mouth daily.      Marland Kitchen azithromycin (ZITHROMAX) 250 MG tablet 2 tabs on day 1 then 1 daily for 9 more days.  11 tablet  0  . cholecalciferol (VITAMIN D) 1000 UNITS tablet Take 1,000 Units by mouth daily.        Physical Exam: Filed Vitals:   12/25/11 1028  BP: 132/74  Pulse: 56  Height: 5\' 6"  (1.676 m)  Weight: 154 lb (69.854 kg)  SpO2: 97%    GEN- The patient is well appearing, alert and oriented x 3 today.   Head- normocephalic, atraumatic Eyes-  Sclera clear, conjunctiva pink Ears- hearing intact Oropharynx- clear Lungs- Clear to ausculation bilaterally, normal work of breathing Heart- Regular rate and rhythm, no murmurs, rubs or gallops, PMI not laterally displaced GI- soft, NT, ND, + BS Extremities- no clubbing, cyanosis, or edema  ekg today reveals sinus rhythm, repolarization abnormality  Assessment and Plan:

## 2012-01-07 DIAGNOSIS — L570 Actinic keratosis: Secondary | ICD-10-CM | POA: Insufficient documentation

## 2012-01-07 DIAGNOSIS — L821 Other seborrheic keratosis: Secondary | ICD-10-CM | POA: Insufficient documentation

## 2012-01-07 DIAGNOSIS — N529 Male erectile dysfunction, unspecified: Secondary | ICD-10-CM | POA: Insufficient documentation

## 2012-01-28 ENCOUNTER — Telehealth: Payer: Self-pay | Admitting: Internal Medicine

## 2012-01-28 NOTE — Telephone Encounter (Signed)
MAR adjusted, pt is off pradaxa since office visit on 12/25/11, confirmed all meds with patient. Will fax note to [facility].

## 2012-01-28 NOTE — Telephone Encounter (Signed)
New problem:   Patient states that Dr. Johney Frame gave him authorization to stop praxada completely.  Patient wishes to presumed known surgical hemorraghic bandaging , need written authorization that praxada may be stop completely.

## 2012-04-21 ENCOUNTER — Telehealth: Payer: Self-pay | Admitting: Internal Medicine

## 2012-04-21 NOTE — Telephone Encounter (Signed)
New problem     Per pt having cardiac issues and wants to talk to you

## 2012-04-21 NOTE — Telephone Encounter (Addendum)
Seems to be having tachycardia, he doesn't think it's afib.  He went out on Sat. For a run and after a mile his HR was 150 and irreg.  This all started about 10 days ago with a new batch of Metoprolol.  He is not schedule to see Dr Johney Frame until June [place] and would like to get it taken care of before he goes.  i have added him on to schedule tomorrow

## 2012-04-22 ENCOUNTER — Encounter: Payer: Self-pay | Admitting: Internal Medicine

## 2012-04-22 ENCOUNTER — Encounter: Payer: Self-pay | Admitting: *Deleted

## 2012-04-22 ENCOUNTER — Ambulatory Visit (INDEPENDENT_AMBULATORY_CARE_PROVIDER_SITE_OTHER): Payer: BC Managed Care – PPO | Admitting: Internal Medicine

## 2012-04-22 VITALS — BP 130/79 | HR 78 | Ht 66.0 in | Wt 155.8 lb

## 2012-04-22 DIAGNOSIS — I4891 Unspecified atrial fibrillation: Secondary | ICD-10-CM

## 2012-04-22 MED ORDER — DABIGATRAN ETEXILATE MESYLATE 150 MG PO CAPS: 150.0000 mg | ORAL_CAPSULE | Freq: Two times a day (BID) | ORAL | Status: DC

## 2012-04-22 NOTE — Patient Instructions (Addendum)
Your physician has requested that you have a TEE/Cardioversion. During a TEE, sound waves are used to create images of your heart. It provides your doctor with information about the size and shape of your heart and how well your heart's chambers and valves are working. In this test, a transducer is attached to the end of a flexible tube that is guided down you throat and into your esophagus (the tube leading from your mouth to your stomach) to get a more detailed image of your heart. Once the TEE has determined that a blood clot is not present, the cardioversion begins. Electrical Cardioversion uses a jolt of electricity to your heart either through paddles or wired patches attached to your chest. This is a controlled, usually prescheduled, procedure. This procedure is done at the hospital and you are not awake during the procedure. You usually go home the day of the procedure. Please see the instruction sheet given to you today for more information.    Your physician has recommended you make the following change in your medication:  1) STOP Aspirin 2) Start Pradaxa 150mg  --twice daily

## 2012-04-23 ENCOUNTER — Telehealth: Payer: Self-pay | Admitting: Internal Medicine

## 2012-04-23 ENCOUNTER — Other Ambulatory Visit: Payer: Self-pay | Admitting: *Deleted

## 2012-04-23 DIAGNOSIS — I4891 Unspecified atrial fibrillation: Secondary | ICD-10-CM

## 2012-04-23 LAB — CBC WITH DIFFERENTIAL/PLATELET
Basophils Absolute: 0 10*3/uL (ref 0.0–0.1)
Basophils Relative: 0.5 % (ref 0.0–3.0)
Eosinophils Absolute: 0.3 10*3/uL (ref 0.0–0.7)
Eosinophils Relative: 4 % (ref 0.0–5.0)
HCT: 45.9 % (ref 39.0–52.0)
Hemoglobin: 15.6 g/dL (ref 13.0–17.0)
Lymphocytes Relative: 39.3 % (ref 12.0–46.0)
Lymphs Abs: 2.5 10*3/uL (ref 0.7–4.0)
MCHC: 34 g/dL (ref 30.0–36.0)
MCV: 86.5 fl (ref 78.0–100.0)
Monocytes Absolute: 0.5 10*3/uL (ref 0.1–1.0)
Monocytes Relative: 8.2 % (ref 3.0–12.0)
Neutro Abs: 3.1 10*3/uL (ref 1.4–7.7)
Neutrophils Relative %: 48 % (ref 43.0–77.0)
Platelets: 195 10*3/uL (ref 150.0–400.0)
RBC: 5.31 Mil/uL (ref 4.22–5.81)
RDW: 12.6 % (ref 11.5–14.6)
WBC: 6.5 10*3/uL (ref 4.5–10.5)

## 2012-04-23 LAB — BASIC METABOLIC PANEL
BUN: 19 mg/dL (ref 6–23)
CO2: 29 mEq/L (ref 19–32)
Calcium: 9 mg/dL (ref 8.4–10.5)
Chloride: 99 mEq/L (ref 96–112)
Creatinine, Ser: 0.9 mg/dL (ref 0.4–1.5)
GFR: 90.11 mL/min (ref >60.00)
Glucose, Bld: 94 mg/dL (ref 70–99)
Potassium: 4 mEq/L (ref 3.5–5.1)
Sodium: 135 mEq/L (ref 135–145)

## 2012-04-23 NOTE — Telephone Encounter (Signed)
New problem    Per pt was waiting for you to call him yesterday to schedule a procedure but never got the call

## 2012-04-23 NOTE — Telephone Encounter (Signed)
Pt.notified

## 2012-04-26 NOTE — Progress Notes (Signed)
PCP: HARTMAN,JESSICA, MD Primary Cardiologist:  Dr Wall  Ronnie Black is a 66 y.o. male who presents today for routine electrophysiology followup.  Since last being seen in our clinic, the patient reports doing very well.  He has noticed over the past few weeks that his heart rates have increased.  This is most noticeable when exercising.  He feels that his exercise tolerance is preserved but that his heart rates will increase to the 130s-140s range which is unusual for him.  Today, he denies symptoms of chest pain, shortness of breath,  lower extremity edema, dizziness, presyncope, or syncope.  The patient is otherwise without complaint today.   Past Medical History  Diagnosis Date  . Hypertrophic obstructive cardiomyopathy     without significant obstruction  . Persistent atrial fibrillation     s/p PVI 3/12  . HLD (hyperlipidemia)   . DDD (degenerative disc disease)    Past Surgical History  Procedure Laterality Date  . Appendectomy    . Atrial fibrillation ablation  03/27/10    PVI with CTI ablation by JA  . Wisdom tooth extraction    . Skin cancer excision    . Cardioversion    . Tee without cardioversion  04/26/2011    Procedure: TRANSESOPHAGEAL ECHOCARDIOGRAM (TEE);  Surgeon: Dalton S McLean, MD;  Location: [facility] ENDOSCOPY;  Service: Cardiovascular;  Laterality: N/A;  to be done at 1330  . Cardioversion  04/26/2011    Procedure: CARDIOVERSION;  Surgeon: Dalton S McLean, MD;  Location: [facility] ENDOS[facility];  Service: Cardiovascular;  Laterality: N/A;    Current Outpatient Prescriptions  Medication Sig Dispense Refill  . fish oil-omega-3 fatty acids 1000 MG capsule Take 1 g by mouth daily.       . loratadine (CLARITIN) 10 MG tablet Take 10 mg by mouth daily as needed. For allergies      . Multiple Vitamin (MULTIVITAMIN) tablet Take 1 tablet by mouth daily.        . niacin 500 MG CR capsule Take 1 capsule (500 mg total) by mouth at bedtime.  90 capsule  3  . atorvastatin (LIPITOR) 10 MG  tablet Take 1 tablet (10 mg total) by mouth daily.  30 tablet  12  . dabigatran (PRADAXA) 150 MG CAPS Take 1 capsule (150 mg total) by mouth every 12 (twelve) hours.  60 capsule  11  . metoprolol tartrate (LOPRESSOR) 25 MG tablet Take 1 tablet (25 mg total) by mouth 2 (two) times daily.  60 tablet  12   No current facility-administered medications for this visit.    Physical Exam: Filed Vitals:   04/22/12 1546  BP: 130/79  Pulse: 78  Height: 5' 6" (1.676 m)  Weight: 155 lb 12.8 oz (70.67 kg)    GEN- The patient is well appearing, alert and oriented x 3 today.   Head- normocephalic, atraumatic Eyes-  Sclera clear, conjunctiva pink Ears- hearing intact Oropharynx- clear Lungs- Clear to ausculation bilaterally, normal work of breathing Heart- irregular rate and rhythm, no murmurs, rubs or gallops, PMI not laterally displaced GI- soft, NT, ND, + BS Extremities- no clubbing, cyanosis, or edema  ekg today reveals afib, V rate 74 bpm, RsR', LVH, nonspecific ST/T changes  Assessment and Plan:  1. afib The patient has recurrent afib post ablation.  He has nonobstructive hypertrophic heart disease which likely contributes. I will restart pradaxa today.  We will plan to proceed with TEE guided cardioversion next week.  Risks, benefits, and alternatives to this procedure were   discussed at length with the patient who wishes to proceed.  I will defer antiarrhythmic medicine as he has done well for a year without AAD post prior ablation.  Return to see me in 4-6 weeks     

## 2012-04-28 ENCOUNTER — Ambulatory Visit (HOSPITAL_COMMUNITY)
Admission: RE | Admit: 2012-04-28 | Discharge: 2012-04-28 | Disposition: A | Discharge status: Home / Self Care | Payer: BC Managed Care – PPO | Source: Ambulatory Visit | Attending: Internal Medicine | Admitting: Internal Medicine

## 2012-04-28 ENCOUNTER — Encounter (HOSPITAL_COMMUNITY): Payer: Self-pay | Admitting: *Deleted

## 2012-04-28 ENCOUNTER — Encounter (HOSPITAL_COMMUNITY)
Admission: RE | Disposition: A | Discharge status: Home / Self Care | Payer: Self-pay | Source: Ambulatory Visit | Attending: Internal Medicine

## 2012-04-28 DIAGNOSIS — IMO0002 Reserved for concepts with insufficient information to code with codable children: Secondary | ICD-10-CM | POA: Insufficient documentation

## 2012-04-28 DIAGNOSIS — I4891 Unspecified atrial fibrillation: Secondary | ICD-10-CM | POA: Insufficient documentation

## 2012-04-28 DIAGNOSIS — Z85828 Personal history of other malignant neoplasm of skin: Secondary | ICD-10-CM | POA: Insufficient documentation

## 2012-04-28 DIAGNOSIS — Z79899 Other long term (current) drug therapy: Secondary | ICD-10-CM | POA: Insufficient documentation

## 2012-04-28 DIAGNOSIS — Z7901 Long term (current) use of anticoagulants: Secondary | ICD-10-CM | POA: Insufficient documentation

## 2012-04-28 DIAGNOSIS — I4892 Unspecified atrial flutter: Secondary | ICD-10-CM

## 2012-04-28 DIAGNOSIS — I422 Other hypertrophic cardiomyopathy: Secondary | ICD-10-CM | POA: Insufficient documentation

## 2012-04-28 DIAGNOSIS — E785 Hyperlipidemia, unspecified: Secondary | ICD-10-CM | POA: Insufficient documentation

## 2012-04-28 HISTORY — PX: CARDIOVERSION: SHX1299

## 2012-04-28 HISTORY — PX: TEE WITHOUT CARDIOVERSION: SHX5443

## 2012-04-28 SURGERY — ECHOCARDIOGRAM, TRANSESOPHAGEAL
Anesthesia: Moderate Sedation

## 2012-04-28 MED ORDER — DIPHENHYDRAMINE HCL 50 MG/ML IJ SOLN
INTRAMUSCULAR | Status: AC
  Filled 2012-04-28: qty 1

## 2012-04-28 MED ORDER — MIDAZOLAM HCL 10 MG/2ML IJ SOLN
INTRAMUSCULAR | Status: DC | PRN
  Administered 2012-04-28 (×3): 2 mg via INTRAVENOUS

## 2012-04-28 MED ORDER — MIDAZOLAM HCL 5 MG/ML IJ SOLN
INTRAMUSCULAR | Status: AC
  Filled 2012-04-28: qty 2

## 2012-04-28 MED ORDER — BUTAMBEN-TETRACAINE-BENZOCAINE 2-2-14 % EX AERO
INHALATION_SPRAY | CUTANEOUS | Status: DC | PRN
  Administered 2012-04-28: 2 via TOPICAL

## 2012-04-28 MED ORDER — FENTANYL CITRATE 0.05 MG/ML IJ SOLN
INTRAMUSCULAR | Status: AC
  Filled 2012-04-28: qty 2

## 2012-04-28 MED ORDER — LIDOCAINE VISCOUS 2 % MT SOLN
OROMUCOSAL | Status: AC
  Filled 2012-04-28: qty 15

## 2012-04-28 MED ORDER — SODIUM CHLORIDE 0.9 % IV SOLN
INTRAVENOUS | Status: DC
  Administered 2012-04-28: 500 mL via INTRAVENOUS

## 2012-04-28 MED ORDER — FENTANYL CITRATE 0.05 MG/ML IJ SOLN
INTRAMUSCULAR | Status: DC | PRN
  Administered 2012-04-28 (×3): 25 ug via INTRAVENOUS

## 2012-04-28 NOTE — CV Procedure (Signed)
    Transesophageal Echocardiogram Note  Ronnie Black [id number] 06-May-1945  Procedure: Transesophageal Echocardiogram Indications: atrial flutter, atrial fibrillation  Procedure Details Consent: Obtained Time Out: Verified patient identification, verified procedure, site/side was marked, verified correct patient position, special equipment/implants available, Radiology Safety Procedures followed,  medications/allergies/relevent history reviewed, required imaging and test results available.  Performed  Medications: Fentanyl: 75 mcg IV Versed: 6 mg IV  Left Ventrical:  Moderate - severe LVH with mild - mod LV dysrfunction - EF 40%  Mitral Valve: mild - mod MR  Aortic Valve: normal AV  Tricuspid Valve: normal TV  Pulmonic Valve: normal, trivial PI  Left Atrium/ Left atrial appendage: no thrombi  Atrial septum: normal  Aorta: normal   Complications: No apparent complications Patient did tolerate procedure well.     Cardioversion Note  Ronnie Black [id number] May 17, 1945  Procedure: DC Cardioversion Indications: atrial flutter, atrial fibrillation  Procedure Details Consent: Obtained Time Out: Verified patient identification, verified procedure, site/side was marked, verified correct patient position, special equipment/implants available, Radiology Safety Procedures followed,  medications/allergies/relevent history reviewed, required imaging and test results available.  Performed  The patient has been on adequate anticoagulation.  The patient no additioinal  sedation.  Synchronous cardioversion was performed at 20  Joules followed by 200 J..  The cardioversion was successful.    Complications: No apparent complications Patient did tolerate procedure well.   [name], Montez Hageman., MD, [facility] 04/28/2012, 10:03 AM

## 2012-04-28 NOTE — Interval H&P Note (Signed)
History and Physical Interval Note:  04/28/2012 9:43 AM  Ronnie Black  has presented today for surgery, with the diagnosis of AFIB  The various methods of treatment have been discussed with the patient and family. After consideration of risks, benefits and other options for treatment, the patient has consented to  Procedure(s): TRANSESOPHAGEAL ECHOCARDIOGRAM (TEE) (N/A) CARDIOVERSION (N/A) as a surgical intervention .  The patient's history has been reviewed, patient examined, no change in status, stable for surgery.  I have reviewed the patient's chart and labs.  Questions were answered to the patient's satisfaction.     [name].

## 2012-04-28 NOTE — Progress Notes (Signed)
  Echocardiogram 2D Echocardiogram has been performed.  Ronnie Black 04/28/2012, 11:20 AM

## 2012-04-28 NOTE — H&P (View-Only) (Signed)
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  Since last being seen in our clinic, the patient reports doing very well.  He has noticed over the past few weeks that his heart rates have increased.  This is most noticeable when exercising.  He feels that his exercise tolerance is preserved but that his heart rates will increase to the 130s-140s range which is unusual for him.  Today, he denies symptoms of chest pain, shortness of breath,  lower extremity edema, dizziness, presyncope, or syncope.  The patient is otherwise without complaint today.   Past Medical History  Diagnosis Date  . Hypertrophic obstructive cardiomyopathy     without significant obstruction  . Persistent atrial fibrillation     s/p PVI 3/12  . HLD (hyperlipidemia)   . DDD (degenerative disc disease)    Past Surgical History  Procedure Laterality Date  . Appendectomy    . Atrial fibrillation ablation  03/27/10    PVI with CTI ablation by JA  . Wisdom tooth extraction    . Skin cancer excision    . Cardioversion    . Tee without cardioversion  04/26/2011    Procedure: TRANSESOPHAGEAL ECHOCARDIOGRAM (TEE);  Surgeon: Laurey Morale, MD;  Location: [facility] ENDOSCOPY;  Service: Cardiovascular;  Laterality: N/A;  to be done at 1330  . Cardioversion  04/26/2011    Procedure: CARDIOVERSION;  Surgeon: Laurey Morale, MD;  Location: [facility] ENDOSCOPY;  Service: Cardiovascular;  Laterality: N/A;    Current Outpatient Prescriptions  Medication Sig Dispense Refill  . fish oil-omega-3 fatty acids 1000 MG capsule Take 1 g by mouth daily.       Marland Kitchen loratadine (CLARITIN) 10 MG tablet Take 10 mg by mouth daily as needed. For allergies      . Multiple Vitamin (MULTIVITAMIN) tablet Take 1 tablet by mouth daily.        . niacin 500 MG CR capsule Take 1 capsule (500 mg total) by mouth at bedtime.  90 capsule  3  . atorvastatin (LIPITOR) 10 MG  tablet Take 1 tablet (10 mg total) by mouth daily.  30 tablet  12  . dabigatran (PRADAXA) 150 MG CAPS Take 1 capsule (150 mg total) by mouth every 12 (twelve) hours.  60 capsule  11  . metoprolol tartrate (LOPRESSOR) 25 MG tablet Take 1 tablet (25 mg total) by mouth 2 (two) times daily.  60 tablet  12   No current facility-administered medications for this visit.    Physical Exam: Filed Vitals:   04/22/12 1546  BP: 130/79  Pulse: 78  Height: 5\' 6"  (1.676 m)  Weight: 155 lb 12.8 oz (70.67 kg)    GEN- The patient is well appearing, alert and oriented x 3 today.   Head- normocephalic, atraumatic Eyes-  Sclera clear, conjunctiva pink Ears- hearing intact Oropharynx- clear Lungs- Clear to ausculation bilaterally, normal work of breathing Heart- irregular rate and rhythm, no murmurs, rubs or gallops, PMI not laterally displaced GI- soft, NT, ND, + BS Extremities- no clubbing, cyanosis, or edema  ekg today reveals afib, V rate 74 bpm, RsR', LVH, nonspecific ST/T changes  Assessment and Plan:  1. afib The patient has recurrent afib post ablation.  He has nonobstructive hypertrophic heart disease which likely contributes. I will restart pradaxa today.  We will plan to proceed with TEE guided cardioversion next week.  Risks, benefits, and alternatives to this procedure were  discussed at length with the patient who wishes to proceed.  I will defer antiarrhythmic medicine as he has done well for a year without AAD post prior ablation.  Return to see me in 4-6 weeks

## 2012-04-29 ENCOUNTER — Encounter (HOSPITAL_COMMUNITY): Payer: Self-pay | Admitting: Cardiovascular Disease

## 2012-05-01 ENCOUNTER — Telehealth: Payer: Self-pay | Admitting: Internal Medicine

## 2012-05-01 NOTE — Telephone Encounter (Signed)
Will call and sch him for 5/12 at 8:45 am

## 2012-05-01 NOTE — Telephone Encounter (Signed)
New Problem:    Patient called in to schedule his follow-up appointment with Dr. Johney Frame following his TEE procedure.  Please call back.

## 2012-05-14 ENCOUNTER — Encounter: Payer: BC Managed Care – PPO | Admitting: Internal Medicine

## 2012-05-20 ENCOUNTER — Encounter: Payer: Self-pay | Admitting: Internal Medicine

## 2012-05-20 ENCOUNTER — Ambulatory Visit (INDEPENDENT_AMBULATORY_CARE_PROVIDER_SITE_OTHER): Payer: BC Managed Care – PPO | Admitting: Internal Medicine

## 2012-05-20 VITALS — BP 129/55 | HR 57 | Ht 66.0 in | Wt 154.2 lb

## 2012-05-20 DIAGNOSIS — I4891 Unspecified atrial fibrillation: Secondary | ICD-10-CM

## 2012-05-20 NOTE — Progress Notes (Signed)
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  Since his recent cardioversion, the patient reports doing very well.  Today, he denies symptoms of palpitations, chest pain, shortness of breath,  lower extremity edema, dizziness, presyncope, or syncope.  The patient is otherwise without complaint today.   Past Medical History  Diagnosis Date  . Hypertrophic obstructive cardiomyopathy     without significant obstruction  . Persistent atrial fibrillation     s/p PVI 3/12  . HLD (hyperlipidemia)   . DDD (degenerative disc disease)    Past Surgical History  Procedure Laterality Date  . Appendectomy    . Atrial fibrillation ablation  03/27/10    PVI with CTI ablation by JA  . Wisdom tooth extraction    . Skin cancer excision    . Cardioversion    . Tee without cardioversion  04/26/2011    Procedure: TRANSESOPHAGEAL ECHOCARDIOGRAM (TEE);  Surgeon: Ronnie Morale, MD;  Location: [facility] ENDOSCOPY;  Service: Cardiovascular;  Laterality: N/A;  to be done at 1330  . Cardioversion  04/26/2011    Procedure: CARDIOVERSION;  Surgeon: Ronnie Morale, MD;  Location: [facility] ENDOSCOPY;  Service: Cardiovascular;  Laterality: N/A;  . Tee without cardioversion N/A 04/28/2012    Procedure: TRANSESOPHAGEAL ECHOCARDIOGRAM (TEE);  Surgeon: Ronnie Mixer, MD;  Location: [facility] ENDOSCOPY;  Service: Cardiovascular;  Laterality: N/A;  . Cardioversion N/A 04/28/2012    Procedure: CARDIOVERSION;  Surgeon: Ronnie Mixer, MD;  Location: [facility] ENDOSCOPY;  Service: Cardiovascular;  Laterality: N/A;    Current Outpatient Prescriptions  Medication Sig Dispense Refill  . atorvastatin (LIPITOR) 10 MG tablet Take 1 tablet (10 mg total) by mouth daily.  30 tablet  12  . dabigatran (PRADAXA) 150 MG CAPS Take 1 capsule (150 mg total) by mouth every 12 (twelve) hours.  60 capsule  11  . fish oil-omega-3 fatty acids 1000 MG capsule Take 1 g by  mouth daily.       Marland Kitchen loratadine (CLARITIN) 10 MG tablet Take 10 mg by mouth daily as needed. For allergies      . metoprolol tartrate (LOPRESSOR) 25 MG tablet Take 1 tablet (25 mg total) by mouth 2 (two) times daily.  60 tablet  12  . Multiple Vitamin (MULTIVITAMIN) tablet Take 1 tablet by mouth daily.        . niacin 500 MG CR capsule Take 1 capsule (500 mg total) by mouth at bedtime.  90 capsule  3   No current facility-administered medications for this visit.    Physical Exam: Filed Vitals:   05/20/12 0852  BP: 129/55  Pulse: 57  Height: 5\' 6"  (1.676 m)  Weight: 154 lb 3.2 oz (69.945 kg)    GEN- The patient is well appearing, alert and oriented x 3 today.   Head- normocephalic, atraumatic Eyes-  Sclera clear, conjunctiva pink Ears- hearing intact Oropharynx- clear Lungs- Clear to ausculation bilaterally, normal work of breathing Heart- Regular rate and rhythm, no murmurs, rubs or gallops, PMI not laterally displaced GI- soft, NT, ND, + BS Extremities- no clubbing, cyanosis, or edema  ekg today reveals sinus rhythm 58 bpm, PR 208, RrR', diffuse TWI are stable, Qtc 420  Assessment and Plan:  1. afib Maintaining sinus rhythm s/p cardioversion No changes  Return in 3 months

## 2012-05-20 NOTE — Patient Instructions (Addendum)
Your physician wants you to follow-up in: 3 months with Dr Allred You will receive a reminder letter in the mail two months in advance. If you don't receive a letter, please call our office to schedule the follow-up appointment.  

## 2012-06-01 ENCOUNTER — Encounter: Payer: BC Managed Care – PPO | Admitting: Internal Medicine

## 2012-06-29 ENCOUNTER — Ambulatory Visit: Payer: BC Managed Care – PPO | Admitting: Internal Medicine

## 2012-07-08 DIAGNOSIS — M5126 Other intervertebral disc displacement, lumbar region: Secondary | ICD-10-CM | POA: Insufficient documentation

## 2012-07-20 ENCOUNTER — Telehealth: Payer: Self-pay | Admitting: Internal Medicine

## 2012-07-20 NOTE — Telephone Encounter (Signed)
New Prob     Pt states he is currently experiencing some afib. Would like to speak to nurse.

## 2012-07-20 NOTE — Telephone Encounter (Signed)
Spoke with patient, he is at [employer] and feels that on Fri his heat went out of rhythm.  He has been on Prednisone taper for his back and this is probably=y what has caused his arrhythmia.  He is weaning himself now and will be home on Sat.  His HR are in the 80's resting (usually 60's) if when he returns from the beach  And he still feels he is out of rhythm he will call the office to come over to obtain an EKG.  If out of rhythm will need to be set up for DCCV per Dr Johney Frame.  Pt verbalized understanding of plan

## 2012-07-27 ENCOUNTER — Other Ambulatory Visit: Payer: BC Managed Care – PPO

## 2012-07-27 ENCOUNTER — Telehealth: Payer: Self-pay | Admitting: Internal Medicine

## 2012-07-27 ENCOUNTER — Ambulatory Visit (INDEPENDENT_AMBULATORY_CARE_PROVIDER_SITE_OTHER): Payer: BC Managed Care – PPO | Admitting: *Deleted

## 2012-07-27 ENCOUNTER — Encounter: Payer: Self-pay | Admitting: *Deleted

## 2012-07-27 ENCOUNTER — Ambulatory Visit (INDEPENDENT_AMBULATORY_CARE_PROVIDER_SITE_OTHER): Payer: BC Managed Care – PPO | Admitting: Internal Medicine

## 2012-07-27 ENCOUNTER — Other Ambulatory Visit: Payer: Self-pay | Admitting: *Deleted

## 2012-07-27 VITALS — BP 128/82 | HR 88 | Wt 152.0 lb

## 2012-07-27 DIAGNOSIS — I4891 Unspecified atrial fibrillation: Secondary | ICD-10-CM

## 2012-07-27 DIAGNOSIS — Z01810 Encounter for preprocedural cardiovascular examination: Secondary | ICD-10-CM

## 2012-07-27 DIAGNOSIS — I421 Obstructive hypertrophic cardiomyopathy: Secondary | ICD-10-CM

## 2012-07-27 LAB — CBC WITH DIFFERENTIAL/PLATELET
Basophils Absolute: 0 10*3/uL (ref 0.0–0.1)
Basophils Relative: 0.2 % (ref 0.0–3.0)
Eosinophils Absolute: 0.1 10*3/uL (ref 0.0–0.7)
Eosinophils Relative: 1.2 % (ref 0.0–5.0)
HCT: 46.2 % (ref 39.0–52.0)
Hemoglobin: 15.5 g/dL (ref 13.0–17.0)
Lymphocytes Relative: 22.3 % (ref 12.0–46.0)
Lymphs Abs: 2.1 10*3/uL (ref 0.7–4.0)
MCHC: 33.5 g/dL (ref 30.0–36.0)
MCV: 88.9 fl (ref 78.0–100.0)
Monocytes Absolute: 0.6 10*3/uL (ref 0.1–1.0)
Monocytes Relative: 6.8 % (ref 3.0–12.0)
Neutro Abs: 6.5 10*3/uL (ref 1.4–7.7)
Neutrophils Relative %: 69.5 % (ref 43.0–77.0)
Platelets: 168 10*3/uL (ref 150.0–400.0)
RBC: 5.2 Mil/uL (ref 4.22–5.81)
RDW: 12.9 % (ref 11.5–14.6)
WBC: 9.3 10*3/uL (ref 4.5–10.5)

## 2012-07-27 LAB — BASIC METABOLIC PANEL WITH GFR
BUN: 13 mg/dL (ref 6–23)
CO2: 33 meq/L — ABNORMAL HIGH (ref 19–32)
Calcium: 9.4 mg/dL (ref 8.4–10.5)
Chloride: 101 meq/L (ref 96–112)
Creatinine, Ser: 1 mg/dL (ref 0.4–1.5)
GFR: 80.57 mL/min
Glucose, Bld: 98 mg/dL (ref 70–99)
Potassium: 4.9 meq/L (ref 3.5–5.1)
Sodium: 138 meq/L (ref 135–145)

## 2012-07-27 LAB — MAGNESIUM: Magnesium: 2.3 mg/dL (ref 1.5–2.5)

## 2012-07-27 NOTE — Progress Notes (Signed)
Patient in today with complaint of active AFib x1 week while he was on vacation. Denies SOB. Denies CP. States he has increased fatigue upon exertion. EKG completed. Dr. Johney Frame will see patient in office today for evaluation. Appt scheduled for 1045.

## 2012-07-27 NOTE — Telephone Encounter (Signed)
New Prob      Pt would like to speak to nurse regarding his afib. Please call.

## 2012-07-27 NOTE — Telephone Encounter (Signed)
Patient states he has been in constant AFib for one week. He has been on vacation out of town. Had called in last week and been advised to come in for EKG upon his return. Patient came in for EKG and saw Dr. Johney Frame. To be scheduled for Cardioversion. Routed to [employer], LPN.

## 2012-07-27 NOTE — Telephone Encounter (Signed)
PT HAVING DCCV  TOMORROW WITH DR CRENSHAW  ./CY

## 2012-07-27 NOTE — Progress Notes (Signed)
  PCP:  HARTMAN,JESSICA, MD  The patient presents today for routine electrophysiology followup.  Since last being seen in our clinic, the patient reports doing very well.  He recently developed backpain.  He took prednisone and then went into afib.  He reports symptoms of palpitations and fatigue with his afib. Today, he denies symptoms of chest pain, shortness of breath, orthopnea, PND, lower extremity edema, dizziness, presyncope, syncope, or neurologic sequela.  The patient feels that he is tolerating medications without difficulties and is otherwise without complaint today.   Past Medical History  Diagnosis Date  . Hypertrophic obstructive cardiomyopathy     without significant obstruction  . Persistent atrial fibrillation     s/p PVI 3/12  . HLD (hyperlipidemia)   . DDD (degenerative disc disease)    Past Surgical History  Procedure Laterality Date  . Appendectomy    . Atrial fibrillation ablation  03/27/10    PVI with CTI ablation by JA  . Wisdom tooth extraction    . Skin cancer excision    . Cardioversion    . Tee without cardioversion  04/26/2011    Procedure: TRANSESOPHAGEAL ECHOCARDIOGRAM (TEE);  Surgeon: Dalton S McLean, MD;  Location: [facility] ENDOSCOPY;  Service: Cardiovascular;  Laterality: N/A;  to be done at 1330  . Cardioversion  04/26/2011    Procedure: CARDIOVERSION;  Surgeon: Dalton S McLean, MD;  Location: [facility] ENDOSCOPY;  Service: Cardiovascular;  Laterality: N/A;  . Tee without cardioversion N/A 04/28/2012    Procedure: TRANSESOPHAGEAL ECHOCARDIOGRAM (TEE);  Surgeon: Philip J Nahser, MD;  Location: [facility] ENDOSCOPY;  Service: Cardiovascular;  Laterality: N/A;  . Cardioversion N/A 04/28/2012    Procedure: CARDIOVERSION;  Surgeon: Philip J Nahser, MD;  Location: [facility] ENDOSCOPY;  Service: Cardiovascular;  Laterality: N/A;    Current Outpatient Prescriptions  Medication Sig Dispense Refill  . atorvastatin (LIPITOR) 10 MG tablet Take 1 tablet (10 mg total) by mouth daily.  30 tablet   12  . dabigatran (PRADAXA) 150 MG CAPS Take 1 capsule (150 mg total) by mouth every 12 (twelve) hours.  60 capsule  11  . fish oil-omega-3 fatty acids 1000 MG capsule Take 1 g by mouth daily.       . loratadine (CLARITIN) 10 MG tablet Take 10 mg by mouth daily as needed. For allergies      . metoprolol tartrate (LOPRESSOR) 25 MG tablet Take 1 tablet (25 mg total) by mouth 2 (two) times daily.  60 tablet  12  . Multiple Vitamin (MULTIVITAMIN) tablet Take 1 tablet by mouth daily.        . niacin 500 MG CR capsule Take 1 capsule (500 mg total) by mouth at bedtime.  90 capsule  3   No current facility-administered medications for this visit.    No Known Allergies  History   Social History  . Marital Status: Married    Spouse Name: N/A    Number of Children: N/A  . Years of Education: N/A   Occupational History  . professor [employer]   Social History Main Topics  . Smoking status: Former Smoker -- 1.00 packs/day for 15 years    Types: Cigarettes    Quit date: 11/28/1975  . Smokeless tobacco: Not on file  . Alcohol Use: 3.6 oz/week    3 Cans of beer, 3 Glasses of wine per week     Comment: rare wine  . Drug Use: No  . Sexually Active: Yes   Other Topics Concern  . Not on   file   Social History Narrative    a professor of political science at [employer]   Lives in [place].    Family History  Problem Relation Age of Onset  . Coronary artery disease      ROS-  All systems are reviewed and are negative except as outlined in the HPI above  Physical Exam: There were no vitals filed for this visit.  GEN- The patient is well appearing, alert and oriented x 3 today.   Head- normocephalic, atraumatic Eyes-  Sclera clear, conjunctiva pink Ears- hearing intact Oropharynx- clear Neck- supple, no JVP Lymph- no cervical lymphadenopathy Lungs- Clear to ausculation bilaterally, normal work of breathing Heart- irregular rate and rhythm  GI- soft, NT, ND, + BS Extremities- no clubbing,  cyanosis, or edema Neuro- strength and sensation are intact  ekg today reveals afib, LVH with repolarization abnormality  Assessment and Plan:  1. Recurrent persistent afib Recent episodes triggered by steroids He reports compliance with pradaxa without interruption I would therefore recommend cardioversion at this time.  Risks, benefits, and alternatives to cardioversion were discussed at length with the patient who wishes to proceed.  We will schedule the procedure at the next available time. Follow up with me in 4-6 weeks  2. Hypertrophic CM Stable No changes 

## 2012-07-28 ENCOUNTER — Encounter (HOSPITAL_COMMUNITY)
Admission: RE | Disposition: A | Discharge status: Home / Self Care | Payer: Self-pay | Source: Ambulatory Visit | Attending: Internal Medicine

## 2012-07-28 ENCOUNTER — Ambulatory Visit (HOSPITAL_COMMUNITY): Payer: BC Managed Care – PPO | Admitting: Anesthesiology

## 2012-07-28 ENCOUNTER — Ambulatory Visit (HOSPITAL_COMMUNITY)
Admission: RE | Admit: 2012-07-28 | Discharge: 2012-07-28 | Disposition: A | Discharge status: Home / Self Care | Payer: BC Managed Care – PPO | Source: Ambulatory Visit | Attending: Internal Medicine | Admitting: Internal Medicine

## 2012-07-28 ENCOUNTER — Encounter (HOSPITAL_COMMUNITY): Payer: Self-pay | Admitting: Anesthesiology

## 2012-07-28 ENCOUNTER — Encounter (HOSPITAL_COMMUNITY): Payer: Self-pay | Admitting: *Deleted

## 2012-07-28 DIAGNOSIS — I4891 Unspecified atrial fibrillation: Secondary | ICD-10-CM

## 2012-07-28 DIAGNOSIS — Z87891 Personal history of nicotine dependence: Secondary | ICD-10-CM | POA: Insufficient documentation

## 2012-07-28 DIAGNOSIS — E785 Hyperlipidemia, unspecified: Secondary | ICD-10-CM | POA: Insufficient documentation

## 2012-07-28 DIAGNOSIS — Z79899 Other long term (current) drug therapy: Secondary | ICD-10-CM | POA: Insufficient documentation

## 2012-07-28 DIAGNOSIS — IMO0002 Reserved for concepts with insufficient information to code with codable children: Secondary | ICD-10-CM | POA: Insufficient documentation

## 2012-07-28 DIAGNOSIS — Z85828 Personal history of other malignant neoplasm of skin: Secondary | ICD-10-CM | POA: Insufficient documentation

## 2012-07-28 DIAGNOSIS — I421 Obstructive hypertrophic cardiomyopathy: Secondary | ICD-10-CM | POA: Insufficient documentation

## 2012-07-28 HISTORY — PX: CARDIOVERSION: SHX1299

## 2012-07-28 SURGERY — CARDIOVERSION
Anesthesia: Monitor Anesthesia Care

## 2012-07-28 MED ORDER — SODIUM CHLORIDE 0.9 % IV SOLN
INTRAVENOUS | Status: DC
  Administered 2012-07-28: 11:00:00 via INTRAVENOUS

## 2012-07-28 MED ORDER — PROPOFOL 10 MG/ML IV BOLUS
INTRAVENOUS | Status: DC | PRN
  Administered 2012-07-28: 70 mg via INTRAVENOUS

## 2012-07-28 NOTE — H&P (View-Only) (Signed)
PCP:  Levander Campion, MD  The patient presents today for routine electrophysiology followup.  Since last being seen in our clinic, the patient reports doing very well.  He recently developed backpain.  He took prednisone and then went into afib.  He reports symptoms of palpitations and fatigue with his afib. Today, he denies symptoms of chest pain, shortness of breath, orthopnea, PND, lower extremity edema, dizziness, presyncope, syncope, or neurologic sequela.  The patient feels that he is tolerating medications without difficulties and is otherwise without complaint today.   Past Medical History  Diagnosis Date  . Hypertrophic obstructive cardiomyopathy     without significant obstruction  . Persistent atrial fibrillation     s/p PVI 3/12  . HLD (hyperlipidemia)   . DDD (degenerative disc disease)    Past Surgical History  Procedure Laterality Date  . Appendectomy    . Atrial fibrillation ablation  03/27/10    PVI with CTI ablation by JA  . Wisdom tooth extraction    . Skin cancer excision    . Cardioversion    . Tee without cardioversion  04/26/2011    Procedure: TRANSESOPHAGEAL ECHOCARDIOGRAM (TEE);  Surgeon: Laurey Morale, MD;  Location: [facility] ENDOSCOPY;  Service: Cardiovascular;  Laterality: N/A;  to be done at 1330  . Cardioversion  04/26/2011    Procedure: CARDIOVERSION;  Surgeon: Laurey Morale, MD;  Location: [facility] ENDOSCOPY;  Service: Cardiovascular;  Laterality: N/A;  . Tee without cardioversion N/A 04/28/2012    Procedure: TRANSESOPHAGEAL ECHOCARDIOGRAM (TEE);  Surgeon: [name], MD;  Location: [facility] ENDOSCOPY;  Service: Cardiovascular;  Laterality: N/A;  . Cardioversion N/A 04/28/2012    Procedure: CARDIOVERSION;  Surgeon: [name], MD;  Location: [facility] ENDOSCOPY;  Service: Cardiovascular;  Laterality: N/A;    Current Outpatient Prescriptions  Medication Sig Dispense Refill  . atorvastatin (LIPITOR) 10 MG tablet Take 1 tablet (10 mg total) by mouth daily.  30 tablet   12  . dabigatran (PRADAXA) 150 MG CAPS Take 1 capsule (150 mg total) by mouth every 12 (twelve) hours.  60 capsule  11  . fish oil-omega-3 fatty acids 1000 MG capsule Take 1 g by mouth daily.       Marland Kitchen loratadine (CLARITIN) 10 MG tablet Take 10 mg by mouth daily as needed. For allergies      . metoprolol tartrate (LOPRESSOR) 25 MG tablet Take 1 tablet (25 mg total) by mouth 2 (two) times daily.  60 tablet  12  . Multiple Vitamin (MULTIVITAMIN) tablet Take 1 tablet by mouth daily.        . niacin 500 MG CR capsule Take 1 capsule (500 mg total) by mouth at bedtime.  90 capsule  3   No current facility-administered medications for this visit.    No Known Allergies  History   Social History  . Marital Status: Married    Spouse Name: N/A    Number of Children: N/A  . Years of Education: N/A   Occupational History  . professor [employer]   Social History Main Topics  . Smoking status: Former Smoker -- 1.00 packs/day for 15 years    Types: Cigarettes    Quit date: 11/28/1975  . Smokeless tobacco: Not on file  . Alcohol Use: 3.6 oz/week    3 Cans of beer, 3 Glasses of wine per week     Comment: rare wine  . Drug Use: No  . Sexually Active: Yes   Other Topics Concern  . 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    Family History  Problem Relation Age of Onset  . Coronary artery disease      ROS-  All systems are reviewed and are negative except as outlined in the HPI above  Physical Exam: There were no vitals filed for this visit.  GEN- The patient is well appearing, alert and oriented x 3 today.   Head- normocephalic, atraumatic Eyes-  Sclera clear, conjunctiva pink Ears- hearing intact Oropharynx- clear Neck- supple, no JVP Lymph- no cervical lymphadenopathy Lungs- Clear to ausculation bilaterally, normal work of breathing Heart- irregular rate and rhythm  GI- soft, NT, ND, + BS Extremities- no clubbing,  cyanosis, or edema Neuro- strength and sensation are intact  ekg today reveals afib, LVH with repolarization abnormality  Assessment and Plan:  1. Recurrent persistent afib Recent episodes triggered by steroids He reports compliance with pradaxa without interruption I would therefore recommend cardioversion at this time.  Risks, benefits, and alternatives to cardioversion were discussed at length with the patient who wishes to proceed.  We will schedule the procedure at the next available time. Follow up with me in 4-6 weeks  2. Hypertrophic CM Stable No changes

## 2012-07-28 NOTE — Transfer of Care (Signed)
Immediate Anesthesia Transfer of Care Note  Patient: Ronnie Black  Procedure(s) Performed: Procedure(s): CARDIOVERSION (N/A)  Patient Location: Endoscopy Unit  Anesthesia Type:MAC  Level of Consciousness: awake, alert  and oriented  Airway & Oxygen Therapy: Patient Spontanous Breathing and Patient connected to nasal cannula oxygen  Post-op Assessment: Report given to PACU RN  Post vital signs: Reviewed and stable  Complications: No apparent anesthesia complications

## 2012-07-28 NOTE — Anesthesia Preprocedure Evaluation (Addendum)
Anesthesia Evaluation  Patient identified by MRN, date of birth, ID band Patient awake    Reviewed: Allergy & Precautions, H&P , NPO status , Patient's Chart, lab work & pertinent test results  Airway       Dental  (+) Teeth Intact and Dental Advisory Given   Pulmonary neg pulmonary ROS,  breath sounds clear to auscultation        Cardiovascular + dysrhythmias Atrial Fibrillation Rhythm:Irregular Rate:Normal  Echo 04/2012  - Left ventricle: Wall thickness was increased in a pattern   of moderate to severe LVH. Systolic function was normal.  The estimated ejection fraction was in the range of 50% to  55%. - Aortic valve: No evidence of vegetation. - Mitral valve: Mild to moderate regurgitation. - Left atrium: No evidence of thrombus in the atrial cavity   or appendage. No evidence of thrombus in the appendage. - Atrial septum: No defect or patent foramen ovale was   identified.    Neuro/Psych negative neurological ROS  negative psych ROS   GI/Hepatic negative GI ROS, Neg liver ROS,   Endo/Other  negative endocrine ROS  Renal/GU negative Renal ROS     Musculoskeletal negative musculoskeletal ROS (+)   Abdominal   Peds  Hematology negative hematology ROS (+)   Anesthesia Other Findings   Reproductive/Obstetrics                          Anesthesia Physical  Anesthesia Plan  ASA: III  Anesthesia Plan: General   Post-op Pain Management:    Induction: Intravenous  Airway Management Planned: Mask  Additional Equipment:   Intra-op Plan:   Post-operative Plan:   Informed Consent: I have reviewed the patients History and Physical, chart, labs and discussed the procedure including the risks, benefits and alternatives for the proposed anesthesia with the patient or authorized representative who has indicated his/her understanding and acceptance.   Dental advisory given  Plan Discussed  with: CRNA, Anesthesiologist and Surgeon  Anesthesia Plan Comments:       Anesthesia Quick Evaluation

## 2012-07-28 NOTE — Preoperative (Signed)
Beta Blockers   Reason not to administer Beta Blockers:Not Applicable 

## 2012-07-28 NOTE — Anesthesia Postprocedure Evaluation (Signed)
  Anesthesia Post-op Note  Patient: Ronnie Black  Procedure(s) Performed: Procedure(s): CARDIOVERSION (N/A)  Patient Location: Endoscopy Unit  Anesthesia Type:MAC  Level of Consciousness: awake, alert  and oriented  Airway and Oxygen Therapy: Patient Spontanous Breathing and Patient connected to nasal cannula oxygen  Post-op Pain: none  Post-op Assessment: Post-op Vital signs reviewed, Patient's Cardiovascular Status Stable, Respiratory Function Stable, Patent Airway, No signs of Nausea or vomiting, Adequate PO intake and Pain level controlled  Post-op Vital Signs: Reviewed and stable  Complications: No apparent anesthesia complications

## 2012-07-28 NOTE — Procedures (Signed)
Electrical Cardioversion Procedure Note Ronnie Black [id number] 1945-09-23  Procedure: Electrical Cardioversion Indications:  Atrial Fibrillation  Procedure Details Consent: Risks of procedure as well as the alternatives and risks of each were explained to the (patient/caregiver).  Consent for procedure obtained. Time Out: Verified patient identification, verified procedure, site/side was marked, verified correct patient position, special equipment/implants available, medications/allergies/relevent history reviewed, required imaging and test results available.  Performed  Patient placed on cardiac monitor, pulse oximetry, supplemental oxygen as necessary.  Sedation given: Patient sedated by anesthesia with diprovan 70 mg IV. Pacer pads placed anterior and posterior chest.  Cardioverted 1 time(s).  Cardioverted at 120J.  Evaluation Findings: Post procedure EKG shows: NSR Complications: None Patient did tolerate procedure well.   Ronnie Black 07/28/2012, 12:08 PM

## 2012-07-28 NOTE — Interval H&P Note (Signed)
History and Physical Interval Note:  07/28/2012 12:07 PM  Ronnie Black  has presented today for surgery, with the diagnosis of AFIB  The various methods of treatment have been discussed with the patient and family. After consideration of risks, benefits and other options for treatment, the patient has consented to  Procedure(s): CARDIOVERSION (N/A) as a surgical intervention .  The patient's history has been reviewed, patient examined, no change in status, stable for surgery.  I have reviewed the patient's chart and labs.  Questions were answered to the patient's satisfaction.     Olga Millers

## 2012-07-29 ENCOUNTER — Encounter (HOSPITAL_COMMUNITY): Payer: Self-pay | Admitting: Cardiology

## 2012-08-04 ENCOUNTER — Telehealth: Payer: Self-pay | Admitting: Internal Medicine

## 2012-08-04 NOTE — Telephone Encounter (Signed)
Patient feels he is back out of rhythm s/p DCCV last week.  I have asked him to come to the office tomorrow morning to have an EKG.  He will be here at 9:00

## 2012-08-04 NOTE — Telephone Encounter (Signed)
New Problem  Pt states since his cardioversion he is still having flutters and it still feels like his heart rhythm is changing.

## 2012-08-05 ENCOUNTER — Ambulatory Visit (INDEPENDENT_AMBULATORY_CARE_PROVIDER_SITE_OTHER): Payer: BC Managed Care – PPO

## 2012-08-05 VITALS — HR 89

## 2012-08-05 DIAGNOSIS — I4891 Unspecified atrial fibrillation: Secondary | ICD-10-CM

## 2012-08-05 MED ORDER — DISOPYRAMIDE PHOSPHATE ER 100 MG PO CP12: 200.0000 mg | ORAL_CAPSULE | Freq: Two times a day (BID) | ORAL | Status: DC

## 2012-08-05 NOTE — Progress Notes (Signed)
The pt came into the office for EKG. EKG shows Afib with rate of 89.  Dr Johney Frame did come into the pt's room and discuss treatment plan (10 minutes).  Per Dr Johney Frame the pt will start Norpace CR 100mg  take 2 by mouth twice a day, repeat EKG in 1 week.  If the pt remains in Afib the pt will need to be scheduled for Cardioversion. Dr Johney Frame would like to see the pt in 4-5 weeks. Rx sent to pharmacy and appointments arranged.

## 2012-08-05 NOTE — Patient Instructions (Addendum)
Per Dr Johney Frame please start Norpace CR 100mg  take 2 by mouth twice a day (every 12 hours)  Please come back into the office for a repeat EKG on 08/12/12 at 9:00.  If you are in Atrial Fibrillation you will need to be scheduled for a Cardioversion.   Your physician recommends that you schedule a follow-up appointment in: 4-5 WEEKS with Dr Johney Frame

## 2012-08-12 ENCOUNTER — Other Ambulatory Visit (INDEPENDENT_AMBULATORY_CARE_PROVIDER_SITE_OTHER): Payer: BC Managed Care – PPO

## 2012-08-12 ENCOUNTER — Ambulatory Visit: Payer: BC Managed Care – PPO | Admitting: Physician Assistant

## 2012-08-12 ENCOUNTER — Encounter: Payer: Self-pay | Admitting: *Deleted

## 2012-08-12 ENCOUNTER — Other Ambulatory Visit: Payer: Self-pay | Admitting: *Deleted

## 2012-08-12 ENCOUNTER — Ambulatory Visit (INDEPENDENT_AMBULATORY_CARE_PROVIDER_SITE_OTHER): Payer: BC Managed Care – PPO | Admitting: *Deleted

## 2012-08-12 DIAGNOSIS — I4891 Unspecified atrial fibrillation: Secondary | ICD-10-CM

## 2012-08-12 LAB — CBC WITH DIFFERENTIAL/PLATELET
Basophils Absolute: 0 10*3/uL (ref 0.0–0.1)
Basophils Relative: 0.4 % (ref 0.0–3.0)
Eosinophils Absolute: 0.2 10*3/uL (ref 0.0–0.7)
Eosinophils Relative: 2.7 % (ref 0.0–5.0)
HCT: 46.7 % (ref 39.0–52.0)
Hemoglobin: 16.1 g/dL (ref 13.0–17.0)
Lymphocytes Relative: 33.2 % (ref 12.0–46.0)
Lymphs Abs: 2.1 10*3/uL (ref 0.7–4.0)
MCHC: 34.5 g/dL (ref 30.0–36.0)
MCV: 86.9 fl (ref 78.0–100.0)
Monocytes Absolute: 0.5 10*3/uL (ref 0.1–1.0)
Monocytes Relative: 8.4 % (ref 3.0–12.0)
Neutro Abs: 3.6 10*3/uL (ref 1.4–7.7)
Neutrophils Relative %: 55.3 % (ref 43.0–77.0)
Platelets: 263 10*3/uL (ref 150.0–400.0)
RBC: 5.36 Mil/uL (ref 4.22–5.81)
RDW: 12.5 % (ref 11.5–14.6)
WBC: 6.4 10*3/uL (ref 4.5–10.5)

## 2012-08-12 LAB — BASIC METABOLIC PANEL
BUN: 14 mg/dL (ref 6–23)
CO2: 30 mEq/L (ref 19–32)
Calcium: 9.7 mg/dL (ref 8.4–10.5)
Chloride: 103 mEq/L (ref 96–112)
Creatinine, Ser: 1.1 mg/dL (ref 0.4–1.5)
GFR: 74.39 mL/min (ref >60.00)
Glucose, Bld: 77 mg/dL (ref 70–99)
Potassium: 5.2 mEq/L — ABNORMAL HIGH (ref 3.5–5.1)
Sodium: 139 mEq/L (ref 135–145)

## 2012-08-12 NOTE — Patient Instructions (Signed)
Your physician has recommended that you have a Cardioversion (DCCV). Electrical Cardioversion uses a jolt of electricity to your heart either through paddles or wired patches attached to your chest. This is a controlled, usually prescheduled, procedure. Defibrillation is done under light anesthesia in the hospital, and you usually go home the day of the procedure. This is done to get your heart back into a normal rhythm. You are not awake for the procedure. Please see the instruction sheet given to you today.   See instruction sheet 

## 2012-08-12 NOTE — Progress Notes (Signed)
Patient came in today for a repeat EKG after restarting his Norpace.  He is still in afib and I have set him up for a Cardioversion for 08/14/12

## 2012-08-14 ENCOUNTER — Encounter (HOSPITAL_COMMUNITY): Payer: Self-pay | Admitting: Anesthesiology

## 2012-08-14 ENCOUNTER — Encounter (HOSPITAL_COMMUNITY)
Admission: RE | Disposition: A | Discharge status: Home / Self Care | Payer: Self-pay | Source: Ambulatory Visit | Attending: Cardiology

## 2012-08-14 ENCOUNTER — Ambulatory Visit (HOSPITAL_COMMUNITY)
Admission: RE | Admit: 2012-08-14 | Discharge: 2012-08-14 | Disposition: A | Discharge status: Home / Self Care | Payer: BC Managed Care – PPO | Source: Ambulatory Visit | Attending: Cardiology | Admitting: Cardiology

## 2012-08-14 ENCOUNTER — Ambulatory Visit (HOSPITAL_COMMUNITY): Payer: BC Managed Care – PPO | Admitting: Anesthesiology

## 2012-08-14 ENCOUNTER — Encounter (HOSPITAL_COMMUNITY): Payer: Self-pay | Admitting: *Deleted

## 2012-08-14 DIAGNOSIS — E785 Hyperlipidemia, unspecified: Secondary | ICD-10-CM | POA: Insufficient documentation

## 2012-08-14 DIAGNOSIS — I421 Obstructive hypertrophic cardiomyopathy: Secondary | ICD-10-CM | POA: Insufficient documentation

## 2012-08-14 DIAGNOSIS — I4891 Unspecified atrial fibrillation: Secondary | ICD-10-CM

## 2012-08-14 DIAGNOSIS — Z7901 Long term (current) use of anticoagulants: Secondary | ICD-10-CM | POA: Insufficient documentation

## 2012-08-14 DIAGNOSIS — Z79899 Other long term (current) drug therapy: Secondary | ICD-10-CM | POA: Insufficient documentation

## 2012-08-14 DIAGNOSIS — IMO0002 Reserved for concepts with insufficient information to code with codable children: Secondary | ICD-10-CM | POA: Insufficient documentation

## 2012-08-14 DIAGNOSIS — Z87891 Personal history of nicotine dependence: Secondary | ICD-10-CM | POA: Insufficient documentation

## 2012-08-14 HISTORY — PX: CARDIOVERSION: SHX1299

## 2012-08-14 SURGERY — CARDIOVERSION
Anesthesia: Monitor Anesthesia Care

## 2012-08-14 MED ORDER — SODIUM CHLORIDE 0.9 % IV SOLN
INTRAVENOUS | Status: DC
  Administered 2012-08-14: 13:00:00 via INTRAVENOUS

## 2012-08-14 MED ORDER — PROPOFOL 10 MG/ML IV BOLUS
INTRAVENOUS | Status: DC | PRN
  Administered 2012-08-14: 70 mg via INTRAVENOUS

## 2012-08-14 NOTE — Anesthesia Preprocedure Evaluation (Addendum)
Anesthesia Evaluation  Patient identified by MRN, date of birth, ID band Patient awake    Reviewed: Allergy & Precautions, H&P , NPO status , Patient's Chart, lab work & pertinent test results, reviewed documented beta blocker date and time   Airway Mallampati: II TM Distance: >3 FB Neck ROM: full    Dental   Pulmonary neg pulmonary ROS,  breath sounds clear to auscultation        Cardiovascular + dysrhythmias Atrial Fibrillation Rhythm:Irregular Rate:Normal     Neuro/Psych negative neurological ROS  negative psych ROS   GI/Hepatic negative GI ROS, Neg liver ROS,   Endo/Other  negative endocrine ROS  Renal/GU negative Renal ROS  negative genitourinary   Musculoskeletal   Abdominal   Peds  Hematology negative hematology ROS (+)   Anesthesia Other Findings See surgeon's H&P   Reproductive/Obstetrics negative OB ROS                          Anesthesia Physical Anesthesia Plan  ASA: III  Anesthesia Plan: General   Post-op Pain Management:    Induction: Intravenous  Airway Management Planned: Mask  Additional Equipment:   Intra-op Plan:   Post-operative Plan:   Informed Consent: I have reviewed the patients History and Physical, chart, labs and discussed the procedure including the risks, benefits and alternatives for the proposed anesthesia with the patient or authorized representative who has indicated his/her understanding and acceptance.   Dental Advisory Given  Plan Discussed with: CRNA and Surgeon  Anesthesia Plan Comments:         Anesthesia Quick Evaluation

## 2012-08-14 NOTE — Anesthesia Postprocedure Evaluation (Signed)
  Anesthesia Post-op Note  Patient: Ronnie Black  Procedure(s) Performed: Procedure(s): CARDIOVERSION (N/A)  Patient Location: PACU  Anesthesia Type:General  Level of Consciousness: awake, alert  and oriented  Airway and Oxygen Therapy: Patient Spontanous Breathing and Patient connected to nasal cannula oxygen  Post-op Pain: none  Post-op Assessment: Post-op Vital signs reviewed, Patient's Cardiovascular Status Stable and Respiratory Function Stable  Post-op Vital Signs: Reviewed and stable  Complications: No apparent anesthesia complications

## 2012-08-14 NOTE — Transfer of Care (Signed)
Immediate Anesthesia Transfer of Care Note  Patient: Ronnie Black  Procedure(s) Performed: Procedure(s): CARDIOVERSION (N/A)  Patient Location: PACU  Anesthesia Type:General  Level of Consciousness: awake, alert  and oriented  Airway & Oxygen Therapy: Patient Spontanous Breathing and Patient connected to nasal cannula oxygen  Post-op Assessment: Report given to PACU RN, Post -op Vital signs reviewed and stable and Patient moving all extremities  Post vital signs: Reviewed and stable  Complications: No apparent anesthesia complications

## 2012-08-14 NOTE — CV Procedure (Signed)
DCC:  Patient on Rx Pradaxa Diprovan 70 mg  Shock x 1 120 J biphasic  Converted from fib/flutter rate 90 to NSR rate 55  No immediate neurologic sequelae Has f/u with Dr Zackery Barefoot

## 2012-08-17 ENCOUNTER — Encounter (HOSPITAL_COMMUNITY): Payer: Self-pay | Admitting: Cardiovascular Disease

## 2012-08-26 ENCOUNTER — Other Ambulatory Visit: Payer: Self-pay

## 2012-09-04 ENCOUNTER — Encounter: Payer: Self-pay | Admitting: Internal Medicine

## 2012-09-04 ENCOUNTER — Ambulatory Visit (INDEPENDENT_AMBULATORY_CARE_PROVIDER_SITE_OTHER): Payer: BC Managed Care – PPO | Admitting: Internal Medicine

## 2012-09-04 VITALS — BP 145/74 | HR 53

## 2012-09-04 DIAGNOSIS — I4892 Unspecified atrial flutter: Secondary | ICD-10-CM

## 2012-09-04 DIAGNOSIS — I4891 Unspecified atrial fibrillation: Secondary | ICD-10-CM

## 2012-09-04 DIAGNOSIS — I421 Obstructive hypertrophic cardiomyopathy: Secondary | ICD-10-CM

## 2012-09-04 NOTE — Patient Instructions (Addendum)
Your physician wants you to follow-up in: 6 months  You will receive a reminder letter in the mail two months in advance. If you don't receive a letter, please call our office to schedule the follow-up appointment.  Your physician recommends that you continue on your current medications as directed. Please refer to the Current Medication list given to you today.  

## 2012-09-04 NOTE — Progress Notes (Signed)
PCP:  Ronnie Ora, MD  The patient presents today for routine electrophysiology followup.  Since his recent cardioversion, the patient reports doing very well. He is tolerating norpace without difficulty. Today, he denies symptoms of chest pain, shortness of breath, orthopnea, PND, lower extremity edema, dizziness, presyncope, syncope, or neurologic sequela.  The patient feels that he is tolerating medications without difficulties and is otherwise without complaint today.   Past Medical History  Diagnosis Date  . Hypertrophic obstructive cardiomyopathy     without significant obstruction  . Persistent atrial fibrillation     s/p PVI 3/12  . HLD (hyperlipidemia)   . DDD (degenerative disc disease)    Past Surgical History  Procedure Laterality Date  . Appendectomy    . Atrial fibrillation ablation  03/27/10    PVI with CTI ablation by JA  . Wisdom tooth extraction    . Skin cancer excision    . Cardioversion    . Tee without cardioversion  04/26/2011    Procedure: TRANSESOPHAGEAL ECHOCARDIOGRAM (TEE);  Surgeon: Laurey Morale, MD;  Location: [facility] ENDOSCOPY;  Service: Cardiovascular;  Laterality: N/A;  to be done at 1330  . Cardioversion  04/26/2011    Procedure: CARDIOVERSION;  Surgeon: Laurey Morale, MD;  Location: [facility] ENDOSCOPY;  Service: Cardiovascular;  Laterality: N/A;  . Tee without cardioversion N/A 04/28/2012    Procedure: TRANSESOPHAGEAL ECHOCARDIOGRAM (TEE);  Surgeon: [name], MD;  Location: [facility] ENDOSCOPY;  Service: Cardiovascular;  Laterality: N/A;  . Cardioversion N/A 04/28/2012    Procedure: CARDIOVERSION;  Surgeon: [name], MD;  Location: [facility] ENDOSCOPY;  Service: Cardiovascular;  Laterality: N/A;  . Cardioversion N/A 07/28/2012    Procedure: CARDIOVERSION;  Surgeon: Lewayne Bunting, MD;  Location: [facility] ENDOSCOPY;  Service: Cardiovascular;  Laterality: N/A;  . Cardioversion N/A 08/14/2012    Procedure: CARDIOVERSION;  Surgeon: Wendall Stade, MD;  Location: [facility]  ENDOSCOPY;  Service: Cardiovascular;  Laterality: N/A;    Current Outpatient Prescriptions  Medication Sig Dispense Refill  . atorvastatin (LIPITOR) 10 MG tablet Take 1 tablet (10 mg total) by mouth daily.  30 tablet  12  . dabigatran (PRADAXA) 150 MG CAPS Take 1 capsule (150 mg total) by mouth every 12 (twelve) hours.  60 capsule  11  . disopyramide (NORPACE CR) 100 MG 12 hr capsule Take 2 capsules (200 mg total) by mouth 2 (two) times daily.  120 capsule  4  . fish oil-omega-3 fatty acids 1000 MG capsule Take 1 g by mouth 3 (three) times a week.       . loratadine (CLARITIN) 10 MG tablet Take 10 mg by mouth daily as needed. For allergies      . metoprolol tartrate (LOPRESSOR) 25 MG tablet Take 1 tablet (25 mg total) by mouth 2 (two) times daily.  60 tablet  12  . Multiple Vitamin (MULTIVITAMIN) tablet Take 1 tablet by mouth 3 (three) times a week.        No current facility-administered medications for this visit.    No Known Allergies  History   Social History  . Marital Status: Married    Spouse Name: N/A    Number of Children: N/A  . Years of Education: N/A   Occupational History  . professor [employer]   Social History Main Topics  . Smoking status: Former Smoker -- 1.00 packs/day for 15 years    Types: Cigarettes    Quit date: 11/28/1975  . Smokeless tobacco: Not on file  .  Alcohol Use: 3.6 oz/week    3 Cans of beer, 3 Glasses of wine per week     Comment: rare wine  . Drug Use: No  . Sexual Activity: Yes   Other Topics Concern  . 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    Family History  Problem Relation Age of Onset  . Coronary artery disease     Physical Exam: Filed Vitals:   09/04/12 1132  BP: 145/74  Pulse: 53    GEN- The patient is well appearing, alert and oriented x 3 today.   Head- normocephalic, atraumatic Eyes-  Sclera clear, conjunctiva pink Ears- hearing intact Oropharynx-  clear Neck- supple, no JVP Lymph- no cervical lymphadenopathy Lungs- Clear to ausculation bilaterally, normal work of breathing Heart- regular rate and rhythm  GI- soft, NT, ND, + BS Extremities- no clubbing, cyanosis, or edema Neuro- strength and sensation are intact  ekg today reveals sinus,  LVH with repolarization abnormality  Assessment and Plan:  1. Recurrent persistent afib Improved with norpace No changes today  2. Hypertrophic CM Stable No changes  Return to see me in 6 months

## 2012-09-08 ENCOUNTER — Encounter: Payer: Self-pay | Admitting: *Deleted

## 2012-09-24 ENCOUNTER — Ambulatory Visit: Payer: BC Managed Care – PPO | Admitting: Internal Medicine

## 2012-10-01 DIAGNOSIS — Z800 Family history of malignant neoplasm of digestive organs: Secondary | ICD-10-CM | POA: Insufficient documentation

## 2012-11-26 ENCOUNTER — Other Ambulatory Visit: Payer: Self-pay

## 2012-12-26 ENCOUNTER — Other Ambulatory Visit: Payer: Self-pay | Admitting: Internal Medicine

## 2012-12-30 ENCOUNTER — Other Ambulatory Visit: Payer: Self-pay

## 2012-12-30 MED ORDER — ATORVASTATIN CALCIUM 10 MG PO TABS: 10.0000 mg | ORAL_TABLET | Freq: Every day | ORAL | Status: DC

## 2012-12-30 MED ORDER — METOPROLOL TARTRATE 25 MG PO TABS: 25.0000 mg | ORAL_TABLET | Freq: Two times a day (BID) | ORAL | Status: DC

## 2013-02-16 ENCOUNTER — Telehealth: Payer: Self-pay

## 2013-02-16 MED ORDER — DISOPYRAMIDE PHOSPHATE 100 MG PO CAPS: 100.0000 mg | ORAL_CAPSULE | Freq: Four times a day (QID) | ORAL | Status: DC

## 2013-02-16 NOTE — Telephone Encounter (Signed)
Spoke with pt.  He has enough Norpace CR to last for another week or two.  Explained there is no release date for the CR available yet.  Will need to change pt to immediate release.  He states he is okay with this and compliance with 4x/day is not an issue.  Will change to 100mg  q6h.  Pt has a follow up with Dr. [name] in February.

## 2013-02-19 NOTE — Telephone Encounter (Signed)
Plan noted.  I agree.

## 2013-03-04 ENCOUNTER — Ambulatory Visit (INDEPENDENT_AMBULATORY_CARE_PROVIDER_SITE_OTHER): Payer: BC Managed Care – PPO | Admitting: Internal Medicine

## 2013-03-04 ENCOUNTER — Encounter: Payer: Self-pay | Admitting: Internal Medicine

## 2013-03-04 VITALS — BP 120/74 | HR 56 | Ht 66.0 in | Wt 155.8 lb

## 2013-03-04 DIAGNOSIS — I4891 Unspecified atrial fibrillation: Secondary | ICD-10-CM

## 2013-03-04 DIAGNOSIS — I421 Obstructive hypertrophic cardiomyopathy: Secondary | ICD-10-CM

## 2013-03-04 NOTE — Patient Instructions (Signed)
Your physician wants you to follow-up in: 12 months with Dr Allred You will receive a reminder letter in the mail two months in advance. If you don't receive a letter, please call our office to schedule the follow-up appointment.  

## 2013-03-04 NOTE — Progress Notes (Signed)
PCP:  [name], MD  The patient presents today for routine electrophysiology followup.  Since his last visit, the patient reports doing very well. He is tolerating short acting norpace without difficulty. Today, he denies symptoms of chest pain, shortness of breath, orthopnea, PND, lower extremity edema, dizziness, presyncope, syncope, or neurologic sequela.  The patient feels that he is tolerating medications without difficulties and is otherwise without complaint today.   Past Medical History  Diagnosis Date  . Hypertrophic obstructive cardiomyopathy     without significant obstruction  . Persistent atrial fibrillation     s/p PVI 3/12  . HLD (hyperlipidemia)   . DDD (degenerative disc disease)    Past Surgical History  Procedure Laterality Date  . Appendectomy    . Atrial fibrillation ablation  03/27/10    PVI with CTI ablation by JA  . Wisdom tooth extraction    . Skin cancer excision    . Cardioversion    . Tee without cardioversion  04/26/2011    Procedure: TRANSESOPHAGEAL ECHOCARDIOGRAM (TEE);  Surgeon: Larey Dresser, MD;  Location: [facility] ENDOSCOPY;  Service: Cardiovascular;  Laterality: N/A;  to be done at 1330  . Cardioversion  04/26/2011    Procedure: CARDIOVERSION;  Surgeon: Larey Dresser, MD;  Location: [facility] ENDOSCOPY;  Service: Cardiovascular;  Laterality: N/A;  . Tee without cardioversion N/A 04/28/2012    Procedure: TRANSESOPHAGEAL ECHOCARDIOGRAM (TEE);  Surgeon: Thayer Headings, MD;  Location: [place];  Service: Cardiovascular;  Laterality: N/A;  . Cardioversion N/A 04/28/2012    Procedure: CARDIOVERSION;  Surgeon: Thayer Headings, MD;  Location: [place];  Service: Cardiovascular;  Laterality: N/A;  . Cardioversion N/A 07/28/2012    Procedure: CARDIOVERSION;  Surgeon: Lelon Perla, MD;  Location: [place];  Service: Cardiovascular;  Laterality: N/A;  . Cardioversion N/A 08/14/2012    Procedure: CARDIOVERSION;  Surgeon: Josue Hector, MD;  Location: [facility]  ENDOSCOPY;  Service: Cardiovascular;  Laterality: N/A;    Current Outpatient Prescriptions  Medication Sig Dispense Refill  . atorvastatin (LIPITOR) 10 MG tablet Take 1 tablet (10 mg total) by mouth daily.  30 tablet  6  . dabigatran (PRADAXA) 150 MG CAPS Take 1 capsule (150 mg total) by mouth every 12 (twelve) hours.  60 capsule  11  . disopyramide (NORPACE) 100 MG capsule Take 1 capsule (100 mg total) by mouth 4 (four) times daily.  120 capsule  5  . fish oil-omega-3 fatty acids 1000 MG capsule Take 1 g by mouth 3 (three) times a week.       . metoprolol tartrate (LOPRESSOR) 25 MG tablet Take 1 tablet (25 mg total) by mouth 2 (two) times daily.  60 tablet  6  . Multiple Vitamin (MULTIVITAMIN) tablet Take 1 tablet by mouth 3 (three) times a week.        No current facility-administered medications for this visit.    No Known Allergies  History   Social History  . Marital Status: Married    Spouse Name: N/A    Number of Children: N/A  . Years of Education: N/A   Occupational History  . professor [employer]   Social History Main Topics  . Smoking status: Former Smoker -- 1.00 packs/day for 15 years    Types: Cigarettes    Quit date: 11/28/1975  . Smokeless tobacco: Not on file  . Alcohol Use: 3.6 oz/week    3 Cans of beer, 3 Glasses of wine per week     Comment:  rare wine  . Drug Use: No  . Sexual Activity: Yes   Other Topics Concern  . Not on file   Social History Narrative    a professor of Therapist, occupational at [employer] in [place].    Family History  Problem Relation Age of Onset  . Coronary artery disease     Physical Exam: Filed Vitals:   03/04/13 1209  BP: 120/74  Pulse: 56  Height: 5\' 6"  (7.209 m)  Weight: 155 lb 12.8 oz (70.67 kg)    GEN- The patient is well appearing, alert and oriented x 3 today.   Head- normocephalic, atraumatic Eyes-  Sclera clear, conjunctiva pink Ears- hearing intact Oropharynx- clear Neck- supple, no JVP Lymph- no  cervical lymphadenopathy Lungs- Clear to ausculation bilaterally, normal work of breathing Heart- regular rate and rhythm  GI- soft, NT, ND, + BS Extremities- no clubbing, cyanosis, or edema Neuro- strength and sensation are intact  ekg today reveals sinus,  LVH with repolarization abnormality  Assessment and Plan:  1. Recurrent persistent afib Controlled norpace No changes today Continue long term anticoagulation with pradaxa  2. Hypertrophic CM Stable No changes  Return to see me in 12 months

## 2013-03-11 ENCOUNTER — Encounter: Payer: Self-pay | Admitting: Internal Medicine

## 2013-04-28 ENCOUNTER — Other Ambulatory Visit: Payer: Self-pay | Admitting: Internal Medicine

## 2013-06-23 ENCOUNTER — Telehealth: Payer: Self-pay | Admitting: Internal Medicine

## 2013-06-23 NOTE — Telephone Encounter (Signed)
Problem with RX--manufacturing problem   Norpace 100mg  ---take 2 twice daily --on back order  ? If he can take 150mg  twice daily which would be a drop from 400mg  to 300mg 

## 2013-06-23 NOTE — Telephone Encounter (Signed)
New message     Pt has a problem with one of his medications.  Please call

## 2013-06-24 MED ORDER — DISOPYRAMIDE PHOSPHATE 150 MG PO CAPS: 150.0000 mg | ORAL_CAPSULE | Freq: Two times a day (BID) | ORAL | Status: DC

## 2013-06-24 NOTE — Telephone Encounter (Signed)
Dr [name] says ok to take 150mg  bid and patient will call us if he starts having problems

## 2013-06-25 ENCOUNTER — Telehealth: Payer: Self-pay | Admitting: Internal Medicine

## 2013-06-25 MED ORDER — NORPACE CR 150 MG PO CP12: 150.0000 mg | ORAL_CAPSULE | Freq: Two times a day (BID) | ORAL | Status: DC

## 2013-06-25 NOTE — Telephone Encounter (Signed)
°

## 2013-06-25 NOTE — Telephone Encounter (Signed)
Sent in the Norpace CR 150mg  bid

## 2013-07-12 ENCOUNTER — Other Ambulatory Visit: Payer: Self-pay

## 2013-07-12 MED ORDER — METOPROLOL TARTRATE 25 MG PO TABS: 25.0000 mg | ORAL_TABLET | Freq: Two times a day (BID) | ORAL | Status: DC

## 2013-07-14 ENCOUNTER — Other Ambulatory Visit: Payer: Self-pay | Admitting: Internal Medicine

## 2013-08-18 ENCOUNTER — Other Ambulatory Visit: Payer: Self-pay | Admitting: Internal Medicine

## 2013-10-25 ENCOUNTER — Other Ambulatory Visit: Payer: Self-pay | Admitting: Internal Medicine

## 2013-10-27 LAB — HM COLONOSCOPY

## 2013-11-04 DIAGNOSIS — E038 Other specified hypothyroidism: Secondary | ICD-10-CM | POA: Insufficient documentation

## 2013-11-04 DIAGNOSIS — E039 Hypothyroidism, unspecified: Secondary | ICD-10-CM | POA: Insufficient documentation

## 2013-11-10 ENCOUNTER — Telehealth: Payer: Self-pay | Admitting: Internal Medicine

## 2013-11-10 MED ORDER — DISOPYRAMIDE PHOSPHATE 100 MG PO CAPS: 100.0000 mg | ORAL_CAPSULE | Freq: Three times a day (TID) | ORAL | Status: DC

## 2013-11-10 NOTE — Telephone Encounter (Signed)
Spoke with [facility] Pharm D said to change to 100mg  every 8 hours

## 2013-11-10 NOTE — Telephone Encounter (Signed)
New message     Pt is on norpace. The manufacturer cannot get the long acting.  Need another presc called in to [facility]

## 2014-02-24 ENCOUNTER — Other Ambulatory Visit: Payer: Self-pay | Admitting: Internal Medicine

## 2014-03-07 ENCOUNTER — Encounter: Payer: Self-pay | Admitting: Internal Medicine

## 2014-03-07 ENCOUNTER — Ambulatory Visit (INDEPENDENT_AMBULATORY_CARE_PROVIDER_SITE_OTHER): Payer: BC Managed Care – PPO | Admitting: Internal Medicine

## 2014-03-07 VITALS — BP 136/74 | HR 54 | Ht 66.0 in | Wt 156.4 lb

## 2014-03-07 DIAGNOSIS — I4891 Unspecified atrial fibrillation: Secondary | ICD-10-CM

## 2014-03-07 NOTE — Patient Instructions (Signed)
Your physician wants you to follow-up in: 12 months with Dr Allred You will receive a reminder letter in the mail two months in advance. If you don't receive a letter, please call our office to schedule the follow-up appointment.  

## 2014-03-07 NOTE — Progress Notes (Deleted)
PCP:  [name], MD  The patient presents today for routine electrophysiology followup.  Since his last visit, the patient reports doing very well. He is tolerating  norpace without difficulty.  Experienced a few pvc's in December. Doing well with Pradaxa without bleeding issues.   Today, he denies symptoms of chest pain, shortness of breath, orthopnea, PND, lower extremity edema, dizziness, presyncope, syncope, or neurologic sequela.  The patient feels that he is tolerating medications without difficulties and is otherwise without complaint today.   Past Medical History  Diagnosis Date  . Hypertrophic obstructive cardiomyopathy     without significant obstruction  . Persistent atrial fibrillation     s/p PVI 3/12  . HLD (hyperlipidemia)   . DDD (degenerative disc disease)    Past Surgical History  Procedure Laterality Date  . Appendectomy    . Atrial fibrillation ablation  03/27/10    PVI with CTI ablation by JA  . Wisdom tooth extraction    . Skin cancer excision    . Cardioversion    . Tee without cardioversion  04/26/2011    Procedure: TRANSESOPHAGEAL ECHOCARDIOGRAM (TEE);  Surgeon: Larey Dresser, MD;  Location: [facility] ENDOSCOPY;  Service: Cardiovascular;  Laterality: N/A;  to be done at 1330  . Cardioversion  04/26/2011    Procedure: CARDIOVERSION;  Surgeon: Larey Dresser, MD;  Location: [facility] ENDOSCOPY;  Service: Cardiovascular;  Laterality: N/A;  . Tee without cardioversion N/A 04/28/2012    Procedure: TRANSESOPHAGEAL ECHOCARDIOGRAM (TEE);  Surgeon: Thayer Headings, MD;  Location: [place];  Service: Cardiovascular;  Laterality: N/A;  . Cardioversion N/A 04/28/2012    Procedure: CARDIOVERSION;  Surgeon: Thayer Headings, MD;  Location: [place];  Service: Cardiovascular;  Laterality: N/A;  . Cardioversion N/A 07/28/2012    Procedure: CARDIOVERSION;  Surgeon: Lelon Perla, MD;  Location: [place];  Service: Cardiovascular;  Laterality: N/A;  . Cardioversion N/A  08/14/2012    Procedure: CARDIOVERSION;  Surgeon: Josue Hector, MD;  Location: [facility] ENDOSCOPY;  Service: Cardiovascular;  Laterality: N/A;    Current Outpatient Prescriptions  Medication Sig Dispense Refill  . atorvastatin (LIPITOR) 10 MG tablet take 1 tablet by mouth once daily 30 tablet 6  . disopyramide (NORPACE CR) 150 MG 12 hr capsule Take 150 mg by mouth 2 (two) times daily.    . fish oil-omega-3 fatty acids 1000 MG capsule Take 1 g by mouth 3 (three) times a week.     . metoprolol tartrate (LOPRESSOR) 25 MG tablet take 1 tablet by mouth twice a day 60 tablet 3  . Multiple Vitamin (MULTIVITAMIN) tablet Take 1 tablet by mouth 3 (three) times a week.     Marland Kitchen PRADAXA 150 MG CAPS capsule take 1 capsule by mouth every 12 hours 60 capsule 11   No current facility-administered medications for this visit.    No Known Allergies  History   Social History  . Marital Status: Married    Spouse Name: N/A  . Number of Children: N/A  . Years of Education: N/A   Occupational History  . professor [employer]   Social History Main Topics  . Smoking status: Former Smoker -- 1.00 packs/day for 15 years    Types: Cigarettes    Quit date: 11/28/1975  . Smokeless tobacco: Not on file  . Alcohol Use: 3.6 oz/week    3 Cans of beer, 3 Glasses of wine per week     Comment: rare wine  . Drug Use: No  .  Sexual Activity: Yes   Other Topics Concern  . Not on file   Social History Narrative    a professor of Therapist, occupational at [employer] in [place].    Family History  Problem Relation Age of Onset  . Coronary artery disease     Physical Exam: Filed Vitals:   03/07/14 1544  BP: 136/74  Pulse: 54  Height: 5\' 6"  (1.676 m)  Weight: 156 lb 6.4 oz (70.943 kg)    GEN- The patient is well appearing, alert and oriented x 3 today.   Head- normocephalic, atraumatic Eyes-  Sclera clear, conjunctiva pink Ears- hearing intact Oropharynx- clear Neck- supple, no JVP Lymph- no cervical  lymphadenopathy Lungs- Clear to ausculation bilaterally, normal work of breathing Heart- regular rate and rhythm  GI- soft, NT, ND, + BS Extremities- no clubbing, cyanosis, or edema Neuro- strength and sensation are intact  ekg today reveals sinus, with first degree av block,   LVH with repolarization abnormality, QTc 450ms.  Assessment and Plan:  1.  persistent afib Controlled with norpace, no complaints of afib since 2014 No changes today Continue long term anticoagulation with pradaxa  2. Hypertrophic CM Stable No changes  Return to see me in 12 months

## 2014-03-07 NOTE — Progress Notes (Signed)
Electrophysiology Office Note   Date:  03/07/2014   ID:  Ronnie Black, DOB [birth year], MRN [id number]  PCP:  [name], MD   Primary Electrophysiologist: Ronnie Grayer, MD    Chief Complaint  Patient presents with  . Atrial Fibrillation    AFIB     History of Present Illness: Ronnie Black is a 69 y.o. male who presents today for electrophysiology evaluation.   He has had no afib in 2 years.  He is pleased with his current health state.  He has rare palpitations.  He continues to exercise and remains very active.  He is still working as a Network engineer.   Today, he denies symptoms of  chest pain, shortness of breath, orthopnea, PND, lower extremity edema, claudication, dizziness, presyncope, syncope, bleeding, or neurologic sequela. The patient is tolerating medications without difficulties and is otherwise without complaint today.    Past Medical History  Diagnosis Date  . Hypertrophic obstructive cardiomyopathy     without significant obstruction  . Persistent atrial fibrillation     s/p PVI 3/12  . HLD (hyperlipidemia)   . DDD (degenerative disc disease)    Past Surgical History  Procedure Laterality Date  . Appendectomy    . Atrial fibrillation ablation  03/27/10    PVI with CTI ablation by JA  . Wisdom tooth extraction    . Skin cancer excision    . Cardioversion    . Tee without cardioversion  04/26/2011    Procedure: TRANSESOPHAGEAL ECHOCARDIOGRAM (TEE);  Surgeon: Larey Dresser, MD;  Location: [facility] ENDOSCOPY;  Service: Cardiovascular;  Laterality: N/A;  to be done at 1330  . Cardioversion  04/26/2011    Procedure: CARDIOVERSION;  Surgeon: Larey Dresser, MD;  Location: [facility] ENDOSCOPY;  Service: Cardiovascular;  Laterality: N/A;  . Tee without cardioversion N/A 04/28/2012    Procedure: TRANSESOPHAGEAL ECHOCARDIOGRAM (TEE);  Surgeon: Thayer Headings, MD;  Location: [place];  Service: Cardiovascular;  Laterality: N/A;  . Cardioversion N/A 04/28/2012   Procedure: CARDIOVERSION;  Surgeon: Thayer Headings, MD;  Location: [place];  Service: Cardiovascular;  Laterality: N/A;  . Cardioversion N/A 07/28/2012    Procedure: CARDIOVERSION;  Surgeon: Lelon Perla, MD;  Location: [place];  Service: Cardiovascular;  Laterality: N/A;  . Cardioversion N/A 08/14/2012    Procedure: CARDIOVERSION;  Surgeon: Josue Hector, MD;  Location: [facility] ENDOSCOPY;  Service: Cardiovascular;  Laterality: N/A;     Current Outpatient Prescriptions  Medication Sig Dispense Refill  . atorvastatin (LIPITOR) 10 MG tablet take 1 tablet by mouth once daily 30 tablet 6  . disopyramide (NORPACE CR) 150 MG 12 hr capsule Take 150 mg by mouth 2 (two) times daily.    . fish oil-omega-3 fatty acids 1000 MG capsule Take 1 g by mouth 3 (three) times a week.     . metoprolol tartrate (LOPRESSOR) 25 MG tablet take 1 tablet by mouth twice a day 60 tablet 3  . Multiple Vitamin (MULTIVITAMIN) tablet Take 1 tablet by mouth 3 (three) times a week.     Marland Kitchen PRADAXA 150 MG CAPS capsule take 1 capsule by mouth every 12 hours 60 capsule 11   No current facility-administered medications for this visit.    Allergies:   Review of patient's allergies indicates no known allergies.   Social History:  The patient  reports that he quit smoking about 38 years ago. His smoking use included Cigarettes. He has a 15 pack-year smoking history. He does not have any  smokeless tobacco history on file. He reports that he drinks about 3.6 oz of alcohol per week. He reports that he does not use illicit drugs.   Family History:  The patient's  family history includes Coronary artery disease in an other family member.    ROS:  Please see the history of present illness.   All other systems are reviewed and negative.    PHYSICAL EXAM: VS:  BP 136/74 mmHg  Pulse 54  Ht 5\' 6"  (1.676 m)  Wt 156 lb 6.4 oz (70.943 kg)  BMI 25.26 kg/m2 , BMI Body mass index is 25.26 kg/(m^2). GEN: Well nourished, well  developed, in no acute distress HEENT: normal Neck: no JVD, carotid bruits, or masses Cardiac: RRR; no murmurs, rubs, or gallops,no edema  Respiratory:  clear to auscultation bilaterally, normal work of breathing GI: soft, nontender, nondistended, + BS MS: no deformity or atrophy Skin: warm and dry  Neuro:  Strength and sensation are intact Psych: euthymic mood, full affect  EKG:  EKG is ordered today. The ekg ordered today shows sinus rhythm with chronic T wave changes, Qtc 460 msec   Recent Labs: No results found for requested labs within last 365 days.    Lipid Panel  No results found for: CHOL, TRIG, HDL, CHOLHDL, VLDL, LDLCALC, LDLDIRECT   Wt Readings from Last 3 Encounters:  03/07/14 156 lb 6.4 oz (70.943 kg)  03/04/13 155 lb 12.8 oz (70.67 kg)  08/14/12 150 lb (68.04 kg)        ASSESSMENT AND PLAN:  1.  Persistent afib Maintaining sinus No changes today Controlled with norpace  Continue long term anticoagulation with pradaxa  2. Hypertrophic CM Stable No changes  Return to see me in 12 months    Current medicines are reviewed at length with the patient today.   The patient does not have concerns regarding his medicines.  The following changes were made today:  none Signed, Ronnie Grayer, MD  03/07/2014 5:02 PM     [place] [address] [phone] (office) [phone] (fax)

## 2014-04-26 ENCOUNTER — Other Ambulatory Visit: Payer: Self-pay | Admitting: Internal Medicine

## 2014-06-26 ENCOUNTER — Other Ambulatory Visit: Payer: Self-pay | Admitting: Internal Medicine

## 2014-06-27 NOTE — Telephone Encounter (Signed)
Per note 2.15.16 

## 2014-07-18 ENCOUNTER — Other Ambulatory Visit: Payer: Self-pay

## 2014-08-04 ENCOUNTER — Telehealth: Payer: Self-pay | Admitting: Internal Medicine

## 2014-08-04 NOTE — Telephone Encounter (Signed)
New message     Pt states heart rate is a little irregular, heart rate faster than normal.   Pt wants to know if he should be seen or have an EKG done Please call to discuss

## 2014-08-04 NOTE — Telephone Encounter (Signed)
Complains of irregular heart rhythm.  States started yesterday evening, after swimming. Denies SOB, dizziness, weakness, fatigue or any other symptoms. Reports he has not been "out of rhythm for 2 yrs", "not since July 2014". States he thought he was doing better, but went to out to run this morning and noticed it happening again. HRs not going above 100.  Averaging 60-80s.  But he does mention he can tell it is going "up and down". Patient will come by office 7/19 at 9am for EKG.   Routing to [employer], Dr. Jackalyn Lombard nurse, for her [facility]

## 2014-08-09 ENCOUNTER — Ambulatory Visit (INDEPENDENT_AMBULATORY_CARE_PROVIDER_SITE_OTHER): Payer: BC Managed Care – PPO

## 2014-08-09 ENCOUNTER — Ambulatory Visit (HOSPITAL_COMMUNITY)
Admission: RE | Admit: 2014-08-09 | Discharge: 2014-08-09 | Disposition: A | Discharge status: Home / Self Care | Payer: BC Managed Care – PPO | Source: Ambulatory Visit | Attending: Nurse Practitioner | Admitting: Nurse Practitioner

## 2014-08-09 ENCOUNTER — Encounter (HOSPITAL_COMMUNITY): Payer: Self-pay | Admitting: Nurse Practitioner

## 2014-08-09 VITALS — BP 122/78 | HR 62 | Wt 156.0 lb

## 2014-08-09 VITALS — BP 140/86 | HR 72 | Ht 66.0 in | Wt 156.0 lb

## 2014-08-09 DIAGNOSIS — I4819 Other persistent atrial fibrillation: Secondary | ICD-10-CM

## 2014-08-09 DIAGNOSIS — I481 Persistent atrial fibrillation: Secondary | ICD-10-CM | POA: Diagnosis present

## 2014-08-09 DIAGNOSIS — I480 Paroxysmal atrial fibrillation: Secondary | ICD-10-CM

## 2014-08-09 LAB — BASIC METABOLIC PANEL
Anion gap: 6 (ref 5–15)
BUN: 17 mg/dL (ref 6–20)
CO2: 30 mmol/L (ref 22–32)
Calcium: 9.6 mg/dL (ref 8.9–10.3)
Chloride: 101 mmol/L (ref 101–111)
Creatinine, Ser: 0.93 mg/dL (ref 0.61–1.24)
GFR calc Af Amer: >60 mL/min (ref >60)
GFR calc non Af Amer: >60 mL/min (ref >60)
Glucose, Bld: 92 mg/dL (ref 65–99)
Potassium: 4.4 mmol/L (ref 3.5–5.1)
Sodium: 137 mmol/L (ref 135–145)

## 2014-08-09 LAB — CBC
HCT: 47.2 % (ref 39.0–52.0)
Hemoglobin: 16.3 g/dL (ref 13.0–17.0)
MCH: 29.8 pg (ref 26.0–34.0)
MCHC: 34.5 g/dL (ref 30.0–36.0)
MCV: 86.3 fL (ref 78.0–100.0)
Platelets: 248 10*3/uL (ref 150–400)
RBC: 5.47 MIL/uL (ref 4.22–5.81)
RDW: 13 % (ref 11.5–15.5)
WBC: 6.1 10*3/uL (ref 4.0–10.5)

## 2014-08-09 LAB — TSH: TSH: 4.321 u[IU]/mL (ref 0.350–4.500)

## 2014-08-09 LAB — MAGNESIUM: Magnesium: 2.3 mg/dL (ref 1.7–2.4)

## 2014-08-09 NOTE — Progress Notes (Signed)
Patient ID: Ronnie Black, male   DOB: 20-Jun-1945, 69 y.o.   MRN: [id number]     Primary Care Physician: [name], MD Referring Physician: Dr. Jimmey Ralph is a 69 y.o. male with a h/o afib ablation in 2012, Cardioversion x 3 in the past, 2013 and 2 in 2014 and usually is in SR with norpace. He went into afib last Wednesday, after swimming. He noticed an increase in heart rate, but did not feel too poorly with it. He has been in afib since then and does feel mildly fatigued but has been able to continue his usual exercise routines of swimming, jogging and bike riding. He is requesting an cardioversion since it seems to keep him in rhythm for long periods of time. Does have a history HOCM but wtihout significant obstruction and previous echos have been stable.  Today, he denies symptoms of palpitations, chest pain, shortness of breath, orthopnea, PND, lower extremity edema, dizziness, presyncope, syncope, or neurologic sequela. The patient is tolerating medications without difficulties and is otherwise without complaint today.   Past Medical History  Diagnosis Date  . Hypertrophic obstructive cardiomyopathy     without significant obstruction  . Persistent atrial fibrillation     s/p PVI 3/12  . HLD (hyperlipidemia)   . DDD (degenerative disc disease)    Past Surgical History  Procedure Laterality Date  . Appendectomy    . Atrial fibrillation ablation  03/27/10    PVI with CTI ablation by JA  . Wisdom tooth extraction    . Skin cancer excision    . Cardioversion    . Tee without cardioversion  04/26/2011    Procedure: TRANSESOPHAGEAL ECHOCARDIOGRAM (TEE);  Surgeon: Larey Dresser, MD;  Location: [facility] ENDOSCOPY;  Service: Cardiovascular;  Laterality: N/A;  to be done at 1330  . Cardioversion  04/26/2011    Procedure: CARDIOVERSION;  Surgeon: Larey Dresser, MD;  Location: [facility] ENDOSCOPY;  Service: Cardiovascular;  Laterality: N/A;  . Tee without cardioversion N/A 04/28/2012    Procedure: TRANSESOPHAGEAL ECHOCARDIOGRAM (TEE);  Surgeon: Thayer Headings, MD;  Location: [place];  Service: Cardiovascular;  Laterality: N/A;  . Cardioversion N/A 04/28/2012    Procedure: CARDIOVERSION;  Surgeon: Thayer Headings, MD;  Location: [place];  Service: Cardiovascular;  Laterality: N/A;  . Cardioversion N/A 07/28/2012    Procedure: CARDIOVERSION;  Surgeon: Lelon Perla, MD;  Location: [place];  Service: Cardiovascular;  Laterality: N/A;  . Cardioversion N/A 08/14/2012    Procedure: CARDIOVERSION;  Surgeon: Josue Hector, MD;  Location: [facility] ENDOSCOPY;  Service: Cardiovascular;  Laterality: N/A;    Current Outpatient Prescriptions  Medication Sig Dispense Refill  . atorvastatin (LIPITOR) 10 MG tablet take 1 tablet by mouth once daily 30 tablet 6  . disopyramide (NORPACE CR) 150 MG 12 hr capsule Take 150 mg by mouth 2 (two) times daily.    . fish oil-omega-3 fatty acids 1000 MG capsule Take 1 g by mouth 3 (three) times a week.     Marland Kitchen ibuprofen (ADVIL,MOTRIN) 200 MG tablet Take 200 mg by mouth every 6 (six) hours as needed.    . Loratadine (CLARITIN) 10 MG CAPS Take 10 mg by mouth as needed (allergies).     . metoprolol tartrate (LOPRESSOR) 25 MG tablet take 1 tablet by mouth twice a day 60 tablet 3  . Multiple Vitamin (MULTIVITAMIN) tablet Take 1 tablet by mouth 3 (three) times a week.     Marland Kitchen omeprazole (PRILOSEC) 20  MG capsule Take 20 mg by mouth as needed.    Marland Kitchen PRADAXA 150 MG CAPS capsule take 1 capsule by mouth every 12 hours 60 capsule 10  . sildenafil (VIAGRA) 25 MG tablet Take 25 mg by mouth as needed for erectile dysfunction.     No current facility-administered medications for this encounter.    No Known Allergies  History   Social History  . Marital Status: Married    Spouse Name: N/A  . Number of Children: N/A  . Years of Education: N/A   Occupational History  . professor [employer]   Social History Main Topics  . Smoking status: Former Smoker -- 1.00  packs/day for 15 years    Types: Cigarettes    Quit date: 11/28/1975  . Smokeless tobacco: Not on file  . Alcohol Use: 3.6 oz/week    3 Cans of beer, 3 Glasses of wine per week     Comment: rare wine  . Drug Use: No  . Sexual Activity: Yes   Other Topics Concern  . Not on file   Social History Narrative    a professor of Therapist, occupational at [employer] in [place].    Family History  Problem Relation Age of Onset  . Coronary artery disease      ROS- All systems are reviewed and negative except as per the HPI above  Physical Exam: Filed Vitals:   08/09/14 1134  BP: 140/86  Pulse: 72  Height: 5\' 6"  (1.676 m)  Weight: 156 lb (70.761 kg)    GEN- The patient is well appearing, alert and oriented x 3 today.   Head- normocephalic, atraumatic Eyes-  Sclera clear, conjunctiva pink Ears- hearing intact Oropharynx- clear Neck- supple, no JVP Lymph- no cervical lymphadenopathy Lungs- Clear to ausculation bilaterally, normal work of breathing Heart- Regular rate and rhythm, no murmurs, rubs or gallops, PMI not laterally displaced GI- soft, NT, ND, + BS Extremities- no clubbing, cyanosis, or edema MS- no significant deformity or atrophy Skin- no rash or lesion Psych- euthymic mood, full affect Neuro- strength and sensation are intact  EKG-from [address] earlier today reveals afib, at 62 bpm. St/T wave abnormality consider inferior/anterolateral ischemia or dig effect. Epic records reviewed   Assessment and Plan:  1. Persistent afib Continue norpace Continue pradaxa without missed doses. Will be set up for cardioversion this Thursday Pre procedure labs to include bmet, cbc, tsh and mag  F/u in afib clinic one week post cardioversion.

## 2014-08-09 NOTE — Patient Instructions (Signed)
Cardioversion scheduled for Thursday, July 21st  - Arrive at the [facility] and go to admitting at [facility] not eat or drink anything after midnight the night prior to your procedure.  - Take all your medication with a sip of water prior to arrival.  - You will not be able to drive home after your procedure.

## 2014-08-09 NOTE — Progress Notes (Signed)
Today at nurse visit still has complaints with irregularity with increased rate over 100 with activity  EKG complete  Reviewed by Dr. Ron Parker  Dr. Ron Parker deferred to  Hulan Fess  Appointment made for A fib clinic today at [facility]

## 2014-08-09 NOTE — Patient Instructions (Signed)
Go to appointment at [facility] today at 1130am  Hard copy of directions to patient  EKG given to Medical records to scan in the system

## 2014-08-11 ENCOUNTER — Ambulatory Visit (HOSPITAL_COMMUNITY): Payer: BC Managed Care – PPO | Admitting: Anesthesiology

## 2014-08-11 ENCOUNTER — Ambulatory Visit (HOSPITAL_COMMUNITY)
Admission: RE | Admit: 2014-08-11 | Discharge: 2014-08-11 | Disposition: A | Discharge status: Home / Self Care | Payer: BC Managed Care – PPO | Source: Ambulatory Visit | Attending: Internal Medicine | Admitting: Internal Medicine

## 2014-08-11 ENCOUNTER — Encounter (HOSPITAL_COMMUNITY): Payer: Self-pay | Admitting: *Deleted

## 2014-08-11 ENCOUNTER — Encounter (HOSPITAL_COMMUNITY)
Admission: RE | Disposition: A | Discharge status: Home / Self Care | Payer: Self-pay | Source: Ambulatory Visit | Attending: Internal Medicine

## 2014-08-11 DIAGNOSIS — E785 Hyperlipidemia, unspecified: Secondary | ICD-10-CM | POA: Insufficient documentation

## 2014-08-11 DIAGNOSIS — I481 Persistent atrial fibrillation: Secondary | ICD-10-CM

## 2014-08-11 DIAGNOSIS — Z87891 Personal history of nicotine dependence: Secondary | ICD-10-CM | POA: Insufficient documentation

## 2014-08-11 DIAGNOSIS — I4891 Unspecified atrial fibrillation: Secondary | ICD-10-CM | POA: Diagnosis present

## 2014-08-11 DIAGNOSIS — I4819 Other persistent atrial fibrillation: Secondary | ICD-10-CM | POA: Insufficient documentation

## 2014-08-11 HISTORY — PX: CARDIOVERSION: SHX1299

## 2014-08-11 SURGERY — CARDIOVERSION
Anesthesia: Monitor Anesthesia Care

## 2014-08-11 MED ORDER — PROPOFOL 10 MG/ML IV BOLUS
INTRAVENOUS | Status: DC | PRN
  Administered 2014-08-11: 80 mg via INTRAVENOUS

## 2014-08-11 MED ORDER — LIDOCAINE HCL (CARDIAC) 20 MG/ML IV SOLN
INTRAVENOUS | Status: DC | PRN
  Administered 2014-08-11: 50 mg via INTRAVENOUS

## 2014-08-11 MED ORDER — SODIUM CHLORIDE 0.9 % IV SOLN
INTRAVENOUS | Status: DC
  Administered 2014-08-11: 10:00:00 via INTRAVENOUS

## 2014-08-11 NOTE — Anesthesia Postprocedure Evaluation (Signed)
Anesthesia Post Note  Patient: Ronnie Black  Procedure(s) Performed: Procedure(s) (LRB): CARDIOVERSION (N/A)  Anesthesia type: general  Patient location: PACU  Post pain: Pain level controlled  Post assessment: Patient's Cardiovascular Status Stable  Last Vitals:  Filed Vitals:   08/11/14 1140  BP: 117/76  Pulse: 53  Resp: 15    Post vital signs: Reviewed and stable  Level of consciousness: sedated  Complications: No apparent anesthesia complications

## 2014-08-11 NOTE — Op Note (Signed)
Pt anesthetized with 80 mg propofol IV   With pads in AP position the pt was cardioverted to SR with 150 J synchronized biphasic energy 12 lead EKG pending.

## 2014-08-11 NOTE — Transfer of Care (Signed)
Immediate Anesthesia Transfer of Care Note  Patient: Ronnie Black  Procedure(s) Performed: Procedure(s): CARDIOVERSION (N/A)  Patient Location: Endoscopy Unit  Anesthesia Type:MAC  Level of Consciousness: awake, alert  and oriented  Airway & Oxygen Therapy: Patient Spontanous Breathing and Patient connected to nasal cannula oxygen  Post-op Assessment: Report given to RN, Post -op Vital signs reviewed and stable and Patient moving all extremities X 4  Post vital signs: Reviewed and stable  Last Vitals:  Filed Vitals:   08/11/14 1014  BP: 131/85  Pulse: 65  Resp: 18    Complications: No apparent anesthesia complications

## 2014-08-11 NOTE — Discharge Instructions (Signed)

## 2014-08-11 NOTE — Anesthesia Preprocedure Evaluation (Addendum)
Anesthesia Evaluation  Patient identified by MRN, date of birth, ID band Patient awake    Reviewed: Allergy & Precautions, H&P , NPO status , Patient's Chart, lab work & pertinent test results  Airway Mallampati: I  TM Distance: >3 FB Neck ROM: full    Dental  (+) Teeth Intact, Dental Advidsory Given   Pulmonary former smoker,    Pulmonary exam normal       Cardiovascular Normal cardiovascular examAtrial Fibrillation     Neuro/Psych    GI/Hepatic   Endo/Other    Renal/GU      Musculoskeletal   Abdominal   Peds  Hematology   Anesthesia Other Findings - Left ventricle: Wall thickness was increased in a pattern of moderate to severe LVH. Systolic function was normal. The estimated ejection fraction was in the range of 50% to 55%. - Aortic valve: No evidence of vegetation. - Mitral valve: Mild to moderate regurgitation. - Left atrium: No evidence of thrombus in the atrial cavity or appendage. No evidence of thrombus in the appendage. - Atrial septum: No defect or patent foramen ovale was identified. Transesophageal echocardiography. 2D and color Doppler. Height: Height: 167.6cm. Height: 66in. Weight: Weight: 68.2kg. Weight: 150lb. Body mass index: BMI: 24.3kg/m^2. Body surface area:  BSA: 1.4m^2. Blood pressure: 132/89. Patient status: Outpatient. Location: Endoscopy.    Reproductive/Obstetrics                           Anesthesia Physical Anesthesia Plan  ASA: III  Anesthesia Plan: General   Post-op Pain Management:    Induction: Intravenous  Airway Management Planned: Mask  Additional Equipment:   Intra-op Plan:   Post-operative Plan:   Informed Consent: I have reviewed the patients History and Physical, chart, labs and discussed the procedure including the risks, benefits and alternatives for the proposed anesthesia with the patient or authorized  representative who has indicated his/her understanding and acceptance.   Dental Advisory Given  Plan Discussed with: CRNA and Surgeon  Anesthesia Plan Comments:        Anesthesia Quick Evaluation

## 2014-08-11 NOTE — H&P (Signed)
Cardiology Office Note   Date:  08/11/2014   ID:  Ronnie Black, DOB 09-12-1945, MRN [id number]  PCP:  [name], MD  Cardiologist:   Sol Blazing chief complaint on file.  Pt presents for cardioversion for afib   History of Present Illness: Ronnie Black is a 69 y.o. male with a history of atrial fib, s/p ablation in 2012; cardioverson x 3 in the past  Last in 2014.  On normpace.  Seen in afib clinic on 7/19  Went into afib 1 wk prior.  Fatigued.   Pt has been on pradaxa.  No missed doses.         Current Facility-Administered Medications  Medication Dose Route Frequency Provider Last Rate Last Dose  . 0.9 %  sodium chloride infusion   Intravenous Continuous Fay Records, MD 20 mL/hr at 08/11/14 1023      Allergies:   Review of patient's allergies indicates no known allergies.   Past Medical History  Diagnosis Date  . Hypertrophic obstructive cardiomyopathy     without significant obstruction  . Persistent atrial fibrillation     s/p PVI 3/12  . HLD (hyperlipidemia)   . DDD (degenerative disc disease)     Past Surgical History  Procedure Laterality Date  . Appendectomy    . Atrial fibrillation ablation  03/27/10    PVI with CTI ablation by JA  . Wisdom tooth extraction    . Skin cancer excision    . Cardioversion    . Tee without cardioversion  04/26/2011    Procedure: TRANSESOPHAGEAL ECHOCARDIOGRAM (TEE);  Surgeon: Larey Dresser, MD;  Location: [facility] ENDOSCOPY;  Service: Cardiovascular;  Laterality: N/A;  to be done at 1330  . Cardioversion  04/26/2011    Procedure: CARDIOVERSION;  Surgeon: Larey Dresser, MD;  Location: [facility] ENDOSCOPY;  Service: Cardiovascular;  Laterality: N/A;  . Tee without cardioversion N/A 04/28/2012    Procedure: TRANSESOPHAGEAL ECHOCARDIOGRAM (TEE);  Surgeon: Thayer Headings, MD;  Location: [place] [facility];  Service: Cardiovascular;  Laterality: N/A;  . Cardioversion N/A 04/28/2012    Procedure: CARDIOVERSION;  Surgeon: Thayer Headings,  MD;  Location: [place];  Service: Cardiovascular;  Laterality: N/A;  . Cardioversion N/A 07/28/2012    Procedure: CARDIOVERSION;  Surgeon: Lelon Perla, MD;  Location: [facility] ENDOSCOPY;  Service: Cardiovascular;  Laterality: N/A;  . Cardioversion N/A 08/14/2012    Procedure: CARDIOVERSION;  Surgeon: Josue Hector, MD;  Location: [facility] ENDOSCOPY;  Service: Cardiovascular;  Laterality: N/A;     Social History:  The patient  reports that he quit smoking about 38 years ago. His smoking use included Cigarettes. He has a 15 pack-year smoking history. He does not have any smokeless tobacco history on file. He reports that he drinks about 3.6 oz of alcohol per week. He reports that he does not use illicit drugs.   Family History:  The patient's family history includes Coronary artery disease in an other family member.    ROS:  Please see the history of present illness. All other systems are reviewed and  Negative to the above problem except as noted.    PHYSICAL EXAM: VS:  BP 131/85 mmHg  Pulse 65  Resp 18  SpO2 100%  GEN: Well nourished, well developed, in no acute distress HEENT: normal Neck: no JVD, carotid bruits, or masses Cardiac: RRR; no murmurs, rubs, or gallops,no edema  Respiratory:  clear to auscultation bilaterally, normal work of breathing GI: soft, nontender,  nondistended, + BS  No hepatomegaly  MS: no deformity Moving all extremities   Skin: warm and dry, no rash Neuro:  Strength and sensation are intact Psych: euthymic mood, full affect   EKG:  EKG is ordered today after procedure.   Lipid Panel No results found for: CHOL, TRIG, HDL, CHOLHDL, VLDL, LDLCALC, LDLDIRECT    Wt Readings from Last 3 Encounters:  08/09/14 156 lb (70.761 kg)  08/09/14 156 lb (70.761 kg)  03/07/14 156 lb 6.4 oz (70.943 kg)      ASSESSMENT AND PLAN:  69 yo with atrial fib  See above  Plan for cardioversion today with assist of anesthesia.     Signed, Dorris Carnes, MD  08/11/2014 10:50  AM    [address], [place], [place]  [address] Phone: [phone]; Fax: [phone]

## 2014-08-11 NOTE — Transfer of Care (Signed)
Immediate Anesthesia Transfer of Care Note  Patient: Ronnie Black  Procedure(s) Performed: Procedure(s): CARDIOVERSION (N/A)  Patient Location: PACU  Anesthesia Type:MAC  Level of Consciousness: awake, alert  and oriented  Airway & Oxygen Therapy: Patient Spontanous Breathing and Patient connected to nasal cannula oxygen  Post-op Assessment: Report given to RN, Post -op Vital signs reviewed and stable and Patient moving all extremities X 4  Post vital signs: Reviewed and stable  Last Vitals:  Filed Vitals:   08/11/14 1140  BP: 117/76  Pulse: 53  Resp: 15    Complications: No apparent anesthesia complications

## 2014-08-12 ENCOUNTER — Encounter (HOSPITAL_COMMUNITY): Payer: Self-pay | Admitting: Internal Medicine

## 2014-08-18 ENCOUNTER — Encounter (HOSPITAL_COMMUNITY): Payer: Self-pay | Admitting: Nurse Practitioner

## 2014-08-18 ENCOUNTER — Ambulatory Visit (HOSPITAL_COMMUNITY)
Admission: RE | Admit: 2014-08-18 | Discharge: 2014-08-18 | Disposition: A | Discharge status: Home / Self Care | Payer: BC Managed Care – PPO | Source: Ambulatory Visit | Attending: Nurse Practitioner | Admitting: Nurse Practitioner

## 2014-08-18 VITALS — BP 114/70 | HR 83 | Ht 66.0 in | Wt 155.6 lb

## 2014-08-18 DIAGNOSIS — Z87891 Personal history of nicotine dependence: Secondary | ICD-10-CM | POA: Diagnosis not present

## 2014-08-18 DIAGNOSIS — I4892 Unspecified atrial flutter: Secondary | ICD-10-CM | POA: Diagnosis present

## 2014-08-18 DIAGNOSIS — Z7902 Long term (current) use of antithrombotics/antiplatelets: Secondary | ICD-10-CM | POA: Diagnosis not present

## 2014-08-18 DIAGNOSIS — E785 Hyperlipidemia, unspecified: Secondary | ICD-10-CM | POA: Diagnosis not present

## 2014-08-18 DIAGNOSIS — I4430 Unspecified atrioventricular block: Secondary | ICD-10-CM | POA: Diagnosis not present

## 2014-08-18 DIAGNOSIS — I421 Obstructive hypertrophic cardiomyopathy: Secondary | ICD-10-CM | POA: Diagnosis not present

## 2014-08-18 DIAGNOSIS — I481 Persistent atrial fibrillation: Secondary | ICD-10-CM | POA: Insufficient documentation

## 2014-08-18 DIAGNOSIS — Z79899 Other long term (current) drug therapy: Secondary | ICD-10-CM | POA: Diagnosis not present

## 2014-08-18 DIAGNOSIS — I4819 Other persistent atrial fibrillation: Secondary | ICD-10-CM

## 2014-08-18 NOTE — Progress Notes (Signed)
Patient ID: Ronnie Black, male   DOB: 1945-01-27, 69 y.o.   MRN: [id number]     Primary Care Physician: [name], MD Referring Physician: Dr. Jimmey Ralph is a 69 y.o. male with a h/o afib ablation in 2012, Cardioversion x 4 in the past, 2013 and (2) in 2014 last one just 7/21, with return of Atrial flutter 6 days later, and has historically been in SR  with norpace sice 2014.Marland Kitchen He went into afib last tuesday, after swimming. He went out off rhythm the week before with swimming triggering the episode. He notices an increase in heart rate, but did not feel too poorly with it. Feels mildly fatigued wih exercise in afib,  but has been able to continue his usual exercise routine of swimming, jogging and bike riding.  Does have a history HOCM but wtihout significant obstruction and previous echos have been stable, last TTE  In 2012.  Today, he denies symptoms of  chest pain, shortness of breath, orthopnea, PND, lower extremity edema, dizziness, presyncope, syncope, or neurologic sequela. Aware of mild palpitations, fatigue.The patient is tolerating medications without difficulties and is otherwise without complaint today.   Past Medical History  Diagnosis Date  . Hypertrophic obstructive cardiomyopathy     without significant obstruction  . Persistent atrial fibrillation     s/p PVI 3/12  . HLD (hyperlipidemia)   . DDD (degenerative disc disease)    Past Surgical History  Procedure Laterality Date  . Appendectomy    . Atrial fibrillation ablation  03/27/10    PVI with CTI ablation by JA  . Wisdom tooth extraction    . Skin cancer excision    . Cardioversion    . Tee without cardioversion  04/26/2011    Procedure: TRANSESOPHAGEAL ECHOCARDIOGRAM (TEE);  Surgeon: Larey Dresser, MD;  Location: [facility] ENDOSCOPY;  Service: Cardiovascular;  Laterality: N/A;  to be done at 1330  . Cardioversion  04/26/2011    Procedure: CARDIOVERSION;  Surgeon: Larey Dresser, MD;  Location: [facility]  ENDOSCOPY;  Service: Cardiovascular;  Laterality: N/A;  . Tee without cardioversion N/A 04/28/2012    Procedure: TRANSESOPHAGEAL ECHOCARDIOGRAM (TEE);  Surgeon: Thayer Headings, MD;  Location: [place];  Service: Cardiovascular;  Laterality: N/A;  . Cardioversion N/A 04/28/2012    Procedure: CARDIOVERSION;  Surgeon: Thayer Headings, MD;  Location: [place];  Service: Cardiovascular;  Laterality: N/A;  . Cardioversion N/A 07/28/2012    Procedure: CARDIOVERSION;  Surgeon: Lelon Perla, MD;  Location: [facility] ENDOSCOPY;  Service: Cardiovascular;  Laterality: N/A;  . Cardioversion N/A 08/14/2012    Procedure: CARDIOVERSION;  Surgeon: Josue Hector, MD;  Location: [place];  Service: Cardiovascular;  Laterality: N/A;  . Cardioversion N/A 08/11/2014    Procedure: CARDIOVERSION;  Surgeon: Fay Records, MD;  Location: [facility] ENDOSCOPY;  Service: Cardiovascular;  Laterality: N/A;    Current Outpatient Prescriptions  Medication Sig Dispense Refill  . atorvastatin (LIPITOR) 10 MG tablet take 1 tablet by mouth once daily 30 tablet 6  . disopyramide (NORPACE CR) 150 MG 12 hr capsule Take 150 mg by mouth 2 (two) times daily.    . fish oil-omega-3 fatty acids 1000 MG capsule Take 1 g by mouth 3 (three) times a week.     Marland Kitchen ibuprofen (ADVIL,MOTRIN) 200 MG tablet Take 200 mg by mouth every 6 (six) hours as needed.    Marland Kitchen LANSOPRAZOLE PO Take 1 tablet by mouth as needed.    . Loratadine (CLARITIN)  10 MG CAPS Take 10 mg by mouth as needed (allergies).     . metoprolol tartrate (LOPRESSOR) 25 MG tablet take 1 tablet by mouth twice a day 60 tablet 3  . Multiple Vitamin (MULTIVITAMIN) tablet Take 1 tablet by mouth 3 (three) times a week.     Marland Kitchen PRADAXA 150 MG CAPS capsule take 1 capsule by mouth every 12 hours 60 capsule 10  . sildenafil (VIAGRA) 25 MG tablet Take 25 mg by mouth as needed for erectile dysfunction.     No current facility-administered medications for this encounter.    No Known  Allergies  History   Social History  . Marital Status: Married    Spouse Name: N/A  . Number of Children: N/A  . Years of Education: N/A   Occupational History  . professor [employer]   Social History Main Topics  . Smoking status: Former Smoker -- 1.00 packs/day for 15 years    Types: Cigarettes    Quit date: 11/28/1975  . Smokeless tobacco: Not on file  . Alcohol Use: 3.6 oz/week    3 Cans of beer, 3 Glasses of wine per week     Comment: rare wine  . Drug Use: No  . Sexual Activity: Yes   Other Topics Concern  . Not on file   Social History Narrative    a professor of Therapist, occupational at [employer] in [place].    Family History  Problem Relation Age of Onset  . Coronary artery disease      ROS- All systems are reviewed and negative except as per the HPI above  Physical Exam: Filed Vitals:   08/18/14 0937  BP: 114/70  Pulse: 83  Height: 5\' 6"  (1.676 m)  Weight: 155 lb 9.6 oz (70.58 kg)    GEN- The patient is well appearing, alert and oriented x 3 today.   Head- normocephalic, atraumatic Eyes-  Sclera clear, conjunctiva pink Ears- hearing intact Oropharynx- clear Neck- supple, no JVP Lymph- no cervical lymphadenopathy Lungs- Clear to ausculation bilaterally, normal work of breathing Heart- Regular rate and rhythm, no murmurs, rubs or gallops, PMI not laterally displaced GI- soft, NT, ND, + BS Extremities- no clubbing, cyanosis, or edema MS- no significant deformity or atrophy Skin- no rash or lesion Psych- euthymic mood, full affect Neuro- strength and sensation are intact  EKG- Atrial flutter with variable AV block, 83 bpm, T wave abnormality, unchanged from previous EKG's, QTc 458 ms. Ekgs reviewed when in SR and QTc exceeds 440 ms. Epic records reviewed   Assessment and Plan:  1. Persistent afib Failed recent cardioversion with return of afib 5 days after procedure.  Will have to ask Dr. [name] his opinion, stopping norpace,  change in  antiarrythmic  vrs repeat ablation QT when is SR is over 440 ms, so may limit use of tiksoyn/sotalol. 1c agents probably contraindicated with HOCM. Repeat echo to evaluate  for any worsening septal hypertrophy. He used amiodarone short term in the past and remembers the side effects so is not very excited to use this Best answer may be to have repeat ablation When I  get direction from Dr. [name], I will let the pt know which direction to follow.  F/u per direction of Dr. [name].

## 2014-08-19 ENCOUNTER — Other Ambulatory Visit (HOSPITAL_COMMUNITY): Payer: Self-pay | Admitting: *Deleted

## 2014-08-19 MED ORDER — DISOPYRAMIDE PHOSPHATE ER 150 MG PO CP12: 300.0000 mg | ORAL_CAPSULE | Freq: Two times a day (BID) | ORAL | Status: DC

## 2014-08-19 NOTE — Telephone Encounter (Signed)
Patient decided would prefer to hold off on ablation for now.  Plan was discussed between Dr. [name] and Roderic Palau, NP and was decided to try increasing Norpace to 300mg  BID and patient will come in for EKG on 8/1 @ 3pm.  If needed DCCV will be set up if still in AFib/Flutter.  Patient was agreeable to this plan and prescription was sent to his pharmacy.

## 2014-08-22 ENCOUNTER — Ambulatory Visit (HOSPITAL_BASED_OUTPATIENT_CLINIC_OR_DEPARTMENT_OTHER)
Admission: RE | Admit: 2014-08-22 | Discharge: 2014-08-22 | Disposition: A | Discharge status: Home / Self Care | Payer: BC Managed Care – PPO | Source: Ambulatory Visit | Attending: Nurse Practitioner | Admitting: Nurse Practitioner

## 2014-08-22 ENCOUNTER — Ambulatory Visit (HOSPITAL_COMMUNITY)
Admission: RE | Admit: 2014-08-22 | Discharge: 2014-08-22 | Disposition: A | Discharge status: Home / Self Care | Payer: BC Managed Care – PPO | Source: Ambulatory Visit | Attending: Nurse Practitioner | Admitting: Nurse Practitioner

## 2014-08-22 ENCOUNTER — Other Ambulatory Visit (HOSPITAL_COMMUNITY): Payer: Self-pay | Admitting: *Deleted

## 2014-08-22 ENCOUNTER — Encounter (HOSPITAL_COMMUNITY): Payer: Self-pay | Admitting: Nurse Practitioner

## 2014-08-22 VITALS — BP 134/78 | HR 77 | Ht 66.0 in | Wt 155.8 lb

## 2014-08-22 DIAGNOSIS — Z8249 Family history of ischemic heart disease and other diseases of the circulatory system: Secondary | ICD-10-CM | POA: Diagnosis not present

## 2014-08-22 DIAGNOSIS — Z87891 Personal history of nicotine dependence: Secondary | ICD-10-CM | POA: Diagnosis not present

## 2014-08-22 DIAGNOSIS — Z79899 Other long term (current) drug therapy: Secondary | ICD-10-CM | POA: Diagnosis not present

## 2014-08-22 DIAGNOSIS — Z7902 Long term (current) use of antithrombotics/antiplatelets: Secondary | ICD-10-CM | POA: Diagnosis not present

## 2014-08-22 DIAGNOSIS — I481 Persistent atrial fibrillation: Secondary | ICD-10-CM | POA: Diagnosis not present

## 2014-08-22 DIAGNOSIS — I4892 Unspecified atrial flutter: Secondary | ICD-10-CM | POA: Insufficient documentation

## 2014-08-22 DIAGNOSIS — I340 Nonrheumatic mitral (valve) insufficiency: Secondary | ICD-10-CM | POA: Diagnosis not present

## 2014-08-22 DIAGNOSIS — E785 Hyperlipidemia, unspecified: Secondary | ICD-10-CM | POA: Diagnosis not present

## 2014-08-22 DIAGNOSIS — I421 Obstructive hypertrophic cardiomyopathy: Secondary | ICD-10-CM | POA: Diagnosis not present

## 2014-08-22 DIAGNOSIS — I4819 Other persistent atrial fibrillation: Secondary | ICD-10-CM

## 2014-08-22 LAB — CBC
HCT: 48.7 % (ref 39.0–52.0)
Hemoglobin: 16.7 g/dL (ref 13.0–17.0)
MCH: 29.9 pg (ref 26.0–34.0)
MCHC: 34.3 g/dL (ref 30.0–36.0)
MCV: 87.1 fL (ref 78.0–100.0)
Platelets: 231 10*3/uL (ref 150–400)
RBC: 5.59 MIL/uL (ref 4.22–5.81)
RDW: 12.8 % (ref 11.5–15.5)
WBC: 6.2 10*3/uL (ref 4.0–10.5)

## 2014-08-22 LAB — BASIC METABOLIC PANEL
Anion gap: 6 (ref 5–15)
BUN: 15 mg/dL (ref 6–20)
CO2: 29 mmol/L (ref 22–32)
Calcium: 9.5 mg/dL (ref 8.9–10.3)
Chloride: 102 mmol/L (ref 101–111)
Creatinine, Ser: 0.96 mg/dL (ref 0.61–1.24)
GFR calc Af Amer: >60 mL/min (ref >60)
GFR calc non Af Amer: >60 mL/min (ref >60)
Glucose, Bld: 102 mg/dL — ABNORMAL HIGH (ref 65–99)
Potassium: 4.3 mmol/L (ref 3.5–5.1)
Sodium: 137 mmol/L (ref 135–145)

## 2014-08-22 NOTE — Progress Notes (Signed)
Patient ID: LEEVON UPPERMAN, male   DOB: 1945-11-08, 69 y.o.   MRN: [id number]     Primary Care Physician: [name], MD  Electrophysiologist: Dr. Hilliard Clark is a 69 y.o. male with a h/o persistent afib with h/o afib ablation 2012, recent breakthrough afib with successful DCCV, 7/21,  x 5 days until pt went swimming, which he thinks was the trigger for the first and most recent  episode of PAF. Pt has been on norpace for years. On last visit, I told pt I would get back to him after I discussed other options to treat afib with recent failed cardioversion. He is not a good candidate for other antiarrythmic's due to septal hypertrophy.  Amiodarone would be his only option. Dr. [name] thought he should discuss with him another ablation. He had a f/u echo today, results still pending. However, pt wanted to try to increase norpace to 300 mg bid and try another cardioversion and avoid swimming in the future. Dr. [name] thought that was a reasonable approach. He has been on the higher dose of norpace since Friday and will be set up for cardioversion later in the week.. If that fails, he will pursue repeat ablation.  Today, he denies symptoms of palpitations, chest pain, shortness of breath, orthopnea, PND, lower extremity edema, dizziness, presyncope, syncope, or neurologic sequela. The patient is tolerating medications without difficulties and is otherwise without complaint today.   Past Medical History  Diagnosis Date  . Hypertrophic obstructive cardiomyopathy     without significant obstruction  . Persistent atrial fibrillation     s/p PVI 3/12  . HLD (hyperlipidemia)   . DDD (degenerative disc disease)    Past Surgical History  Procedure Laterality Date  . Appendectomy    . Atrial fibrillation ablation  03/27/10    PVI with CTI ablation by JA  . Wisdom tooth extraction    . Skin cancer excision    . Cardioversion    . Tee without cardioversion  04/26/2011    Procedure:  TRANSESOPHAGEAL ECHOCARDIOGRAM (TEE);  Surgeon: Larey Dresser, MD;  Location: [facility] ENDOSCOPY;  Service: Cardiovascular;  Laterality: N/A;  to be done at 1330  . Cardioversion  04/26/2011    Procedure: CARDIOVERSION;  Surgeon: Larey Dresser, MD;  Location: [facility] ENDOSCOPY;  Service: Cardiovascular;  Laterality: N/A;  . Tee without cardioversion N/A 04/28/2012    Procedure: TRANSESOPHAGEAL ECHOCARDIOGRAM (TEE);  Surgeon: Thayer Headings, MD;  Location: [place];  Service: Cardiovascular;  Laterality: N/A;  . Cardioversion N/A 04/28/2012    Procedure: CARDIOVERSION;  Surgeon: Thayer Headings, MD;  Location: [place];  Service: Cardiovascular;  Laterality: N/A;  . Cardioversion N/A 07/28/2012    Procedure: CARDIOVERSION;  Surgeon: Lelon Perla, MD;  Location: [facility] ENDOSCOPY;  Service: Cardiovascular;  Laterality: N/A;  . Cardioversion N/A 08/14/2012    Procedure: CARDIOVERSION;  Surgeon: Josue Hector, MD;  Location: [place];  Service: Cardiovascular;  Laterality: N/A;  . Cardioversion N/A 08/11/2014    Procedure: CARDIOVERSION;  Surgeon: Fay Records, MD;  Location: [facility] ENDOSCOPY;  Service: Cardiovascular;  Laterality: N/A;    Current Outpatient Prescriptions  Medication Sig Dispense Refill  . atorvastatin (LIPITOR) 10 MG tablet take 1 tablet by mouth once daily 30 tablet 6  . disopyramide (NORPACE CR) 150 MG 12 hr capsule Take 2 capsules (300 mg total) by mouth 2 (two) times daily. 120 capsule 3  . fish oil-omega-3 fatty acids 1000 MG capsule Take  1 g by mouth 3 (three) times a week.     Marland Kitchen ibuprofen (ADVIL,MOTRIN) 200 MG tablet Take 200 mg by mouth every 6 (six) hours as needed.    Marland Kitchen LANSOPRAZOLE PO Take 1 tablet by mouth as needed.    . Loratadine (CLARITIN) 10 MG CAPS Take 10 mg by mouth as needed (allergies).     . metoprolol tartrate (LOPRESSOR) 25 MG tablet take 1 tablet by mouth twice a day 60 tablet 3  . Multiple Vitamin (MULTIVITAMIN) tablet Take 1 tablet by mouth 3 (three) times a  week.     Marland Kitchen PRADAXA 150 MG CAPS capsule take 1 capsule by mouth every 12 hours 60 capsule 10  . sildenafil (VIAGRA) 25 MG tablet Take 25 mg by mouth as needed for erectile dysfunction.     No current facility-administered medications for this encounter.    No Known Allergies  History   Social History  . Marital Status: Married    Spouse Name: N/A  . Number of Children: N/A  . Years of Education: N/A   Occupational History  . professor [employer]   Social History Main Topics  . Smoking status: Former Smoker -- 1.00 packs/day for 15 years    Types: Cigarettes    Quit date: 11/28/1975  . Smokeless tobacco: Not on file  . Alcohol Use: 3.6 oz/week    3 Cans of beer, 3 Glasses of wine per week     Comment: rare wine  . Drug Use: No  . Sexual Activity: Yes   Other Topics Concern  . Not on file   Social History Narrative    a professor of Therapist, occupational at [employer] in [place].    Family History  Problem Relation Age of Onset  . Coronary artery disease      ROS- All systems are reviewed and negative except as per the HPI above  Physical Exam: Filed Vitals:   08/22/14 1500  BP: 134/78  Pulse: 77  Height: 5\' 6"  (1.676 m)  Weight: 155 lb 12.8 oz (70.67 kg)    GEN- The patient is well appearing, alert and oriented x 3 today.   Head- normocephalic, atraumatic Eyes-  Sclera clear, conjunctiva pink Ears- hearing intact Oropharynx- clear Neck- supple, no JVP Lymph- no cervical lymphadenopathy Lungs- Clear to ausculation bilaterally, normal work of breathing Heart- Regular rate and rhythm, no murmurs, rubs or gallops, PMI not laterally displaced GI- soft, NT, ND, + BS Extremities- no clubbing, cyanosis, or edema MS- no significant deformity or atrophy Skin- no rash or lesion Psych- euthymic mood, full affect Neuro- strength and sensation are intact  EKG-Afib at 77 bpm, T wave abnormality which is unchanged, prolonged QTc at 473 ms.  Assessment and Plan: 1.  Recurrent persistent afib, failed recent DCCV after 5 days, pt thinks triggered by swimming. Pt has increased norpace to 300 mg bid and will attempt another cardioversion latter part of the week. If this fails, he will discuss with Dr. [name] repeat abaltion Echo pending to further assess septal hypertrophy. Pre procedure labs today. No missed doses of pradaxa.  Geroge Baseman Lashun Mccants, [facility] [address] [place], [place] [address] [phone]

## 2014-08-22 NOTE — Progress Notes (Signed)
  Echocardiogram 2D Echocardiogram has been performed.  Darlina Sicilian M 08/22/2014, 2:53 PM

## 2014-08-22 NOTE — Patient Instructions (Signed)
Cardioversion scheduled for Friday, August 5th  - Arrive at the [facility] and go to admitting at [place] not eat or drink anything after midnight the night prior to your procedure.  - Take all your medication with a sip of water prior to arrival.  - You will not be able to drive home after your procedure.

## 2014-08-26 ENCOUNTER — Encounter (HOSPITAL_COMMUNITY)
Admission: RE | Disposition: A | Discharge status: Home / Self Care | Payer: Self-pay | Source: Ambulatory Visit | Attending: Cardiovascular Disease

## 2014-08-26 ENCOUNTER — Encounter (HOSPITAL_COMMUNITY): Payer: Self-pay | Admitting: *Deleted

## 2014-08-26 ENCOUNTER — Ambulatory Visit (HOSPITAL_COMMUNITY): Payer: BC Managed Care – PPO | Admitting: Anesthesiology

## 2014-08-26 ENCOUNTER — Ambulatory Visit (HOSPITAL_COMMUNITY)
Admission: RE | Admit: 2014-08-26 | Discharge: 2014-08-26 | Disposition: A | Discharge status: Home / Self Care | Payer: BC Managed Care – PPO | Source: Ambulatory Visit | Attending: Cardiovascular Disease | Admitting: Cardiovascular Disease

## 2014-08-26 DIAGNOSIS — Z791 Long term (current) use of non-steroidal anti-inflammatories (NSAID): Secondary | ICD-10-CM | POA: Diagnosis not present

## 2014-08-26 DIAGNOSIS — E785 Hyperlipidemia, unspecified: Secondary | ICD-10-CM | POA: Insufficient documentation

## 2014-08-26 DIAGNOSIS — I421 Obstructive hypertrophic cardiomyopathy: Secondary | ICD-10-CM | POA: Insufficient documentation

## 2014-08-26 DIAGNOSIS — Z8249 Family history of ischemic heart disease and other diseases of the circulatory system: Secondary | ICD-10-CM | POA: Diagnosis not present

## 2014-08-26 DIAGNOSIS — I481 Persistent atrial fibrillation: Secondary | ICD-10-CM | POA: Diagnosis not present

## 2014-08-26 DIAGNOSIS — Z7901 Long term (current) use of anticoagulants: Secondary | ICD-10-CM | POA: Diagnosis not present

## 2014-08-26 DIAGNOSIS — Z87891 Personal history of nicotine dependence: Secondary | ICD-10-CM | POA: Insufficient documentation

## 2014-08-26 DIAGNOSIS — Z79899 Other long term (current) drug therapy: Secondary | ICD-10-CM | POA: Insufficient documentation

## 2014-08-26 HISTORY — PX: CARDIOVERSION: SHX1299

## 2014-08-26 SURGERY — CARDIOVERSION
Anesthesia: Monitor Anesthesia Care

## 2014-08-26 MED ORDER — LIDOCAINE HCL (CARDIAC) 20 MG/ML IV SOLN
INTRAVENOUS | Status: DC | PRN
  Administered 2014-08-26: 60 mg via INTRAVENOUS

## 2014-08-26 MED ORDER — SODIUM CHLORIDE 0.9 % IV SOLN
INTRAVENOUS | Status: DC
Start: 2014-08-26 — End: 2014-08-26
  Administered 2014-08-26: 14:00:00 via INTRAVENOUS
  Administered 2014-08-26: 500 mL via INTRAVENOUS

## 2014-08-26 MED ORDER — PROPOFOL 10 MG/ML IV BOLUS
INTRAVENOUS | Status: DC | PRN
  Administered 2014-08-26: 80 mg via INTRAVENOUS

## 2014-08-26 NOTE — H&P (View-Only) (Signed)
Patient ID: Ronnie Black, male   DOB: November 10, 1945, 69 y.o.   MRN: [id number]     Primary Care Physician: [name], MD  Electrophysiologist: Dr. Hilliard Clark is a 69 y.o. male with a h/o persistent afib with h/o afib ablation 2012, recent breakthrough afib with successful DCCV, 7/21,  x 5 days until pt went swimming, which he thinks was the trigger for the first and most recent  episode of PAF. Pt has been on norpace for years. On last visit, I told pt I would get back to him after I discussed other options to treat afib with recent failed cardioversion. He is not a good candidate for other antiarrythmic's due to septal hypertrophy.  Amiodarone would be his only option. Dr. [name] thought he should discuss with him another ablation. He had a f/u echo today, results still pending. However, pt wanted to try to increase norpace to 300 mg bid and try another cardioversion and avoid swimming in the future. Dr. [name] thought that was a reasonable approach. He has been on the higher dose of norpace since Friday and will be set up for cardioversion later in the week.. If that fails, he will pursue repeat ablation.  Today, he denies symptoms of palpitations, chest pain, shortness of breath, orthopnea, PND, lower extremity edema, dizziness, presyncope, syncope, or neurologic sequela. The patient is tolerating medications without difficulties and is otherwise without complaint today.   Past Medical History  Diagnosis Date  . Hypertrophic obstructive cardiomyopathy     without significant obstruction  . Persistent atrial fibrillation     s/p PVI 3/12  . HLD (hyperlipidemia)   . DDD (degenerative disc disease)    Past Surgical History  Procedure Laterality Date  . Appendectomy    . Atrial fibrillation ablation  03/27/10    PVI with CTI ablation by JA  . Wisdom tooth extraction    . Skin cancer excision    . Cardioversion    . Tee without cardioversion  04/26/2011    Procedure:  TRANSESOPHAGEAL ECHOCARDIOGRAM (TEE);  Surgeon: Larey Dresser, MD;  Location: [facility] ENDOSCOPY;  Service: Cardiovascular;  Laterality: N/A;  to be done at 1330  . Cardioversion  04/26/2011    Procedure: CARDIOVERSION;  Surgeon: Larey Dresser, MD;  Location: [facility] ENDOSCOPY;  Service: Cardiovascular;  Laterality: N/A;  . Tee without cardioversion N/A 04/28/2012    Procedure: TRANSESOPHAGEAL ECHOCARDIOGRAM (TEE);  Surgeon: Thayer Headings, MD;  Location: [place];  Service: Cardiovascular;  Laterality: N/A;  . Cardioversion N/A 04/28/2012    Procedure: CARDIOVERSION;  Surgeon: Thayer Headings, MD;  Location: [place];  Service: Cardiovascular;  Laterality: N/A;  . Cardioversion N/A 07/28/2012    Procedure: CARDIOVERSION;  Surgeon: Lelon Perla, MD;  Location: [facility] ENDOSCOPY;  Service: Cardiovascular;  Laterality: N/A;  . Cardioversion N/A 08/14/2012    Procedure: CARDIOVERSION;  Surgeon: Josue Hector, MD;  Location: [place];  Service: Cardiovascular;  Laterality: N/A;  . Cardioversion N/A 08/11/2014    Procedure: CARDIOVERSION;  Surgeon: Fay Records, MD;  Location: [facility] ENDOSCOPY;  Service: Cardiovascular;  Laterality: N/A;    Current Outpatient Prescriptions  Medication Sig Dispense Refill  . atorvastatin (LIPITOR) 10 MG tablet take 1 tablet by mouth once daily 30 tablet 6  . disopyramide (NORPACE CR) 150 MG 12 hr capsule Take 2 capsules (300 mg total) by mouth 2 (two) times daily. 120 capsule 3  . fish oil-omega-3 fatty acids 1000 MG capsule Take  1 g by mouth 3 (three) times a week.     Marland Kitchen ibuprofen (ADVIL,MOTRIN) 200 MG tablet Take 200 mg by mouth every 6 (six) hours as needed.    Marland Kitchen LANSOPRAZOLE PO Take 1 tablet by mouth as needed.    . Loratadine (CLARITIN) 10 MG CAPS Take 10 mg by mouth as needed (allergies).     . metoprolol tartrate (LOPRESSOR) 25 MG tablet take 1 tablet by mouth twice a day 60 tablet 3  . Multiple Vitamin (MULTIVITAMIN) tablet Take 1 tablet by mouth 3 (three) times a  week.     Marland Kitchen PRADAXA 150 MG CAPS capsule take 1 capsule by mouth every 12 hours 60 capsule 10  . sildenafil (VIAGRA) 25 MG tablet Take 25 mg by mouth as needed for erectile dysfunction.     No current facility-administered medications for this encounter.    No Known Allergies  History   Social History  . Marital Status: Married    Spouse Name: N/A  . Number of Children: N/A  . Years of Education: N/A   Occupational History  . professor [employer]   Social History Main Topics  . Smoking status: Former Smoker -- 1.00 packs/day for 15 years    Types: Cigarettes    Quit date: 11/28/1975  . Smokeless tobacco: Not on file  . Alcohol Use: 3.6 oz/week    3 Cans of beer, 3 Glasses of wine per week     Comment: rare wine  . Drug Use: No  . Sexual Activity: Yes   Other Topics Concern  . Not on file   Social History Narrative    a professor of Therapist, occupational at [employer] in [place].    Family History  Problem Relation Age of Onset  . Coronary artery disease      ROS- All systems are reviewed and negative except as per the HPI above  Physical Exam: Filed Vitals:   08/22/14 1500  BP: 134/78  Pulse: 77  Height: 5\' 6"  (1.676 m)  Weight: 155 lb 12.8 oz (70.67 kg)    GEN- The patient is well appearing, alert and oriented x 3 today.   Head- normocephalic, atraumatic Eyes-  Sclera clear, conjunctiva pink Ears- hearing intact Oropharynx- clear Neck- supple, no JVP Lymph- no cervical lymphadenopathy Lungs- Clear to ausculation bilaterally, normal work of breathing Heart- Regular rate and rhythm, no murmurs, rubs or gallops, PMI not laterally displaced GI- soft, NT, ND, + BS Extremities- no clubbing, cyanosis, or edema MS- no significant deformity or atrophy Skin- no rash or lesion Psych- euthymic mood, full affect Neuro- strength and sensation are intact  EKG-Afib at 77 bpm, T wave abnormality which is unchanged, prolonged QTc at 473 ms.  Assessment and Plan: 1.  Recurrent persistent afib, failed recent DCCV after 5 days, pt thinks triggered by swimming. Pt has increased norpace to 300 mg bid and will attempt another cardioversion latter part of the week. If this fails, he will discuss with Dr. [name] repeat abaltion Echo pending to further assess septal hypertrophy. Pre procedure labs today. No missed doses of pradaxa.  Geroge Baseman Xela Oregel, [facility] [address] [place], [place] [address] [phone]

## 2014-08-26 NOTE — Interval H&P Note (Signed)
History and Physical Interval Note:  08/26/2014 1:57 PM  Ronnie Black  has presented today for surgery, with the diagnosis of AFIB  The various methods of treatment have been discussed with the patient and family. After consideration of risks, benefits and other options for treatment, the patient has consented to  Procedure(s): CARDIOVERSION (N/A) as a surgical intervention .  The patient's history has been reviewed, patient examined, no change in status, stable for surgery.  I have reviewed the patient's chart and labs.  Questions were answered to the patient's satisfaction.     Arhum Peeples

## 2014-08-26 NOTE — Anesthesia Preprocedure Evaluation (Addendum)
Anesthesia Evaluation  Patient identified by MRN, date of birth, ID band Patient awake    Reviewed: Allergy & Precautions, NPO status , Patient's Chart, lab work & pertinent test results, reviewed documented beta blocker date and time   Airway Mallampati: II       Dental  (+) Teeth Intact   Pulmonary former smoker,  breath sounds clear to auscultation        Cardiovascular Exercise Tolerance: Good + dysrhythmias Atrial Fibrillation Rhythm:Irregular Rate:Abnormal     Neuro/Psych negative neurological ROS     GI/Hepatic negative GI ROS, Neg liver ROS,   Endo/Other  negative endocrine ROS  Renal/GU negative Renal ROS     Musculoskeletal  (+) Arthritis -,   Abdominal   Peds negative pediatric ROS (+)  Hematology negative hematology ROS (+)   Anesthesia Other Findings   Reproductive/Obstetrics                            Lab Results  Component Value Date   WBC 6.2 08/22/2014   HGB 16.7 08/22/2014   HCT 48.7 08/22/2014   MCV 87.1 08/22/2014   PLT 231 08/22/2014   Lab Results  Component Value Date   CREATININE 0.96 08/22/2014   BUN 15 08/22/2014   NA 137 08/22/2014   K 4.3 08/22/2014   CL 102 08/22/2014   CO2 29 08/22/2014   EKG: atrial fibrillation, rate 77.   Anesthesia Physical Anesthesia Plan  ASA: II  Anesthesia Plan: MAC   Post-op Pain Management:    Induction: Intravenous  Airway Management Planned: Mask and Natural Airway  Additional Equipment:   Intra-op Plan:   Post-operative Plan:   Informed Consent: I have reviewed the patients History and Physical, chart, labs and discussed the procedure including the risks, benefits and alternatives for the proposed anesthesia with the patient or authorized representative who has indicated his/her understanding and acceptance.     Plan Discussed with:   Anesthesia Plan Comments:         Anesthesia Quick  Evaluation

## 2014-08-26 NOTE — Anesthesia Postprocedure Evaluation (Signed)
  Anesthesia Post-op Note  Patient: Ronnie Black  Procedure(s) Performed: Procedure(s): CARDIOVERSION (N/A)  Patient Location: PACU and Endoscopy Unit  Anesthesia Type:MAC  Level of Consciousness: awake, alert  and oriented  Airway and Oxygen Therapy: Patient Spontanous Breathing  Post-op Pain: none  Post-op Assessment: Post-op Vital signs reviewed              Post-op Vital Signs: Reviewed and stable  Last Vitals:  Filed Vitals:   08/26/14 1422  BP: 106/64  Pulse: 54  Temp:   Resp: 15    Complications: No apparent anesthesia complications

## 2014-08-26 NOTE — Discharge Instructions (Signed)

## 2014-08-26 NOTE — Anesthesia Postprocedure Evaluation (Signed)
  Anesthesia Post-op Note  Patient: Ronnie Black  Procedure(s) Performed: Procedure(s): CARDIOVERSION (N/A)  Patient Location: Endoscopy Unit  Anesthesia Type:MAC  Level of Consciousness: awake  Airway and Oxygen Therapy: Patient Spontanous Breathing and Patient connected to nasal cannula oxygen  Post-op Pain: none  Post-op Assessment: Post-op Vital signs reviewed, Patient's Cardiovascular Status Stable, Respiratory Function Stable, Patent Airway and No signs of Nausea or vomiting              Post-op Vital Signs: Reviewed and stable  Last Vitals:  Filed Vitals:   08/26/14 1422  BP: 106/64  Pulse: 54  Temp:   Resp: 15    Complications: No apparent anesthesia complications

## 2014-08-26 NOTE — Transfer of Care (Signed)
Immediate Anesthesia Transfer of Care Note  Patient: Ronnie Black  Procedure(s) Performed: Procedure(s): CARDIOVERSION (N/A)  Patient Location: Endoscopy Unit  Anesthesia Type:MAC  Level of Consciousness: awake  Airway & Oxygen Therapy: Patient Spontanous Breathing and Patient connected to nasal cannula oxygen  Post-op Assessment: Report given to RN and Post -op Vital signs reviewed and stable  Post vital signs: Reviewed and stable  Last Vitals:  Filed Vitals:   08/26/14 1422  BP: 106/64  Pulse: 54  Temp:   Resp: 15    Complications: No apparent anesthesia complications

## 2014-08-26 NOTE — Op Note (Signed)
Procedure: Electrical Cardioversion Indications:  Atrial Fibrillation  Procedure Details:  Consent: Risks of procedure as well as the alternatives and risks of each were explained to the (patient/caregiver).  Consent for procedure obtained.  Time Out: Verified patient identification, verified procedure, site/side was marked, verified correct patient position, special equipment/implants available, medications/allergies/relevent history reviewed, required imaging and test results available.  Performed  Patient placed on cardiac monitor, pulse oximetry, supplemental oxygen as necessary.  Sedation given: IV propofol, Dr. Smith Robert Pacer pads placed anterior and posterior chest.  Cardioverted 1 time(s).  Cardioversion with synchronized biphasic 120J shock.  Evaluation: Findings: Post procedure EKG shows: NSR (brief 7-8 beat atrial tachycardia early after shock) Complications: None Patient did tolerate procedure well.  Time Spent Directly with the Patient:  30 minutes   Camry Theiss 08/26/2014, 3:08 PM

## 2014-08-29 ENCOUNTER — Encounter (HOSPITAL_COMMUNITY): Payer: Self-pay | Admitting: Cardiovascular Disease

## 2014-09-07 ENCOUNTER — Ambulatory Visit (HOSPITAL_COMMUNITY): Payer: BC Managed Care – PPO | Admitting: Nurse Practitioner

## 2014-09-08 ENCOUNTER — Encounter (HOSPITAL_COMMUNITY): Payer: Self-pay | Admitting: Nurse Practitioner

## 2014-09-08 ENCOUNTER — Ambulatory Visit (HOSPITAL_COMMUNITY)
Admission: RE | Admit: 2014-09-08 | Discharge: 2014-09-08 | Disposition: A | Discharge status: Home / Self Care | Payer: BC Managed Care – PPO | Source: Ambulatory Visit | Attending: Nurse Practitioner | Admitting: Nurse Practitioner

## 2014-09-08 VITALS — BP 138/70 | HR 61 | Ht 66.0 in | Wt 156.4 lb

## 2014-09-08 DIAGNOSIS — E785 Hyperlipidemia, unspecified: Secondary | ICD-10-CM | POA: Diagnosis not present

## 2014-09-08 DIAGNOSIS — I4819 Other persistent atrial fibrillation: Secondary | ICD-10-CM

## 2014-09-08 DIAGNOSIS — Z7902 Long term (current) use of antithrombotics/antiplatelets: Secondary | ICD-10-CM | POA: Insufficient documentation

## 2014-09-08 DIAGNOSIS — I481 Persistent atrial fibrillation: Secondary | ICD-10-CM | POA: Insufficient documentation

## 2014-09-08 DIAGNOSIS — I421 Obstructive hypertrophic cardiomyopathy: Secondary | ICD-10-CM | POA: Insufficient documentation

## 2014-09-08 DIAGNOSIS — Z87891 Personal history of nicotine dependence: Secondary | ICD-10-CM | POA: Insufficient documentation

## 2014-09-08 DIAGNOSIS — Z79899 Other long term (current) drug therapy: Secondary | ICD-10-CM | POA: Diagnosis not present

## 2014-09-08 NOTE — Progress Notes (Addendum)
Patient ID: Ronnie Black, male   DOB: [birth year], 69 y.o.   MRN: [id number]     Primary Care Physician: [name], MD Referring Physician: Dr. Jimmey Ralph is a 69 y.o. male with a h/o afib ablation in 2012, Cardioversion x 4 in the past, 2013 and (2) in 2014 DCCV 7/21, with return of Atrial flutter 6 days later, and has historically been in SR  with norpace sice 2014. He went into afib for the last 2x  after swimming. His DCCV was successful but again after swimming, he returned to afib. He returned to clinic and decided to  increase  Norpace to 300 mg and had repeat DCCV on 8/5. He returns today in [place]. Has noticed a few palpitations but no sustained afib. Overall feels well. Repeat echo showed normal EF with moderate LVH.  Today, he denies symptoms of  chest pain, shortness of breath, orthopnea, PND, lower extremity edema, dizziness, presyncope, syncope, or neurologic sequela. Aware of mild palpitations, fatigue.The patient is tolerating medications without difficulties and is otherwise without complaint today.   Past Medical History  Diagnosis Date  . Hypertrophic obstructive cardiomyopathy     without significant obstruction  . Persistent atrial fibrillation     s/p PVI 3/12  . HLD (hyperlipidemia)   . DDD (degenerative disc disease)    Past Surgical History  Procedure Laterality Date  . Appendectomy    . Atrial fibrillation ablation  03/27/10    PVI with CTI ablation by JA  . Wisdom tooth extraction    . Skin cancer excision    . Cardioversion    . Tee without cardioversion  04/26/2011    Procedure: TRANSESOPHAGEAL ECHOCARDIOGRAM (TEE);  Surgeon: Larey Dresser, MD;  Location: [facility] ENDOSCOPY;  Service: Cardiovascular;  Laterality: N/A;  to be done at 1330  . Cardioversion  04/26/2011    Procedure: CARDIOVERSION;  Surgeon: Larey Dresser, MD;  Location: [facility] ENDOSCOPY;  Service: Cardiovascular;  Laterality: N/A;  . Tee without cardioversion N/A 04/28/2012    Procedure:  TRANSESOPHAGEAL ECHOCARDIOGRAM (TEE);  Surgeon: Thayer Headings, MD;  Location: [place];  Service: Cardiovascular;  Laterality: N/A;  . Cardioversion N/A 04/28/2012    Procedure: CARDIOVERSION;  Surgeon: Thayer Headings, MD;  Location: [place];  Service: Cardiovascular;  Laterality: N/A;  . Cardioversion N/A 07/28/2012    Procedure: CARDIOVERSION;  Surgeon: Lelon Perla, MD;  Location: [place];  Service: Cardiovascular;  Laterality: N/A;  . Cardioversion N/A 08/14/2012    Procedure: CARDIOVERSION;  Surgeon: Josue Hector, MD;  Location: [place];  Service: Cardiovascular;  Laterality: N/A;  . Cardioversion N/A 08/11/2014    Procedure: CARDIOVERSION;  Surgeon: Fay Records, MD;  Location: [place];  Service: Cardiovascular;  Laterality: N/A;  . Cardioversion N/A 08/26/2014    Procedure: CARDIOVERSION;  Surgeon: Sanda Klein, MD;  Location: [facility] ENDOSCOPY;  Service: Cardiovascular;  Laterality: N/A;    Current Outpatient Prescriptions  Medication Sig Dispense Refill  . atorvastatin (LIPITOR) 10 MG tablet take 1 tablet by mouth once daily (Patient taking differently: Take 10 mg by mouth once daily) 30 tablet 6  . disopyramide (NORPACE CR) 150 MG 12 hr capsule Take 2 capsules (300 mg total) by mouth 2 (two) times daily. 120 capsule 3  . fish oil-omega-3 fatty acids 1000 MG capsule Take 1 g by mouth 3 (three) times a week.     Marland Kitchen ibuprofen (ADVIL,MOTRIN) 200 MG tablet Take 200 mg by mouth every  6 (six) hours as needed for moderate pain.     . Loratadine (CLARITIN) 10 MG CAPS Take 10 mg by mouth as needed (allergies).     . metoprolol tartrate (LOPRESSOR) 25 MG tablet take 1 tablet by mouth twice a day (Patient taking differently: Take 25 mg by mouth twice daily) 60 tablet 3  . Multiple Vitamin (MULTIVITAMIN) tablet Take 1 tablet by mouth 3 (three) times a week.     Marland Kitchen PRADAXA 150 MG CAPS capsule take 1 capsule by mouth every 12 hours (Patient taking differently: Take 150 mg by  mouth twice daily) 60 capsule 10  . sildenafil (VIAGRA) 25 MG tablet Take 25 mg by mouth as needed for erectile dysfunction.     No current facility-administered medications for this encounter.    No Known Allergies  Social History   Social History  . Marital Status: Married    Spouse Name: N/A  . Number of Children: N/A  . Years of Education: N/A   Occupational History  . professor [employer]   Social History Main Topics  . Smoking status: Former Smoker -- 1.00 packs/day for 15 years    Types: Cigarettes    Quit date: 11/28/1975  . Smokeless tobacco: Not on file  . Alcohol Use: 3.6 oz/week    3 Glasses of wine, 3 Cans of beer per week     Comment: rare wine  . Drug Use: No  . Sexual Activity: Yes   Other Topics Concern  . Not on file   Social History Narrative    a professor of Therapist, occupational at [employer] in [place].    Family History  Problem Relation Age of Onset  . Coronary artery disease      ROS- All systems are reviewed and negative except as per the HPI above  Physical Exam: Filed Vitals:   09/08/14 0835  BP: 138/70  Pulse: 61  Height: 5\' 6"  (1.676 m)  Weight: 156 lb 6.4 oz (70.943 kg)    GEN- The patient is well appearing, alert and oriented x 3 today.   Head- normocephalic, atraumatic Eyes-  Sclera clear, conjunctiva pink Ears- hearing intact Oropharynx- clear Neck- supple, no JVP Lymph- no cervical lymphadenopathy Lungs- Clear to ausculation bilaterally, normal work of breathing Heart- Regular rate and rhythm, no murmurs, rubs or gallops, PMI not laterally displaced GI- soft, NT, ND, + BS Extremities- no clubbing, cyanosis, or edema MS- no significant deformity or atrophy Skin- no rash or lesion Psych- euthymic mood, full affect Neuro- strength and sensation are intact  EKG-SR with 1st degree AVB with one recorded PVC. T wave abnormality unchanged prom previous, QRS 154ms,  QTc int at 463 ms. Echo- Left ventricle: The cavity size  was normal. Wall thickness was increased in a pattern of moderate LVH. Systolic function was normal. The estimated ejection fraction was in the range of 55% to 60%. Wall motion was normal; there were no regional wall motion abnormalities. - Mitral valve: There was mild regurgitation. - Right atrium: The atrium was mildly to moderately dilated.   Assessment and Plan:  1. Persistent afib  Maintaining SR after last DCCV with increase of norpace to 300 mg bid. Continue pradaxa  150 mg bid Has noticed a few palpitations that feels more like premature contractions. If worsen call office and will place 48 hour monitor.  F/u with Dr. [name] in 3 months  Geroge Baseman. Miqueas Whilden, [facility] [place], [place]  [id number] [phone]  

## 2014-09-15 ENCOUNTER — Encounter (HOSPITAL_COMMUNITY): Payer: Self-pay | Admitting: Nurse Practitioner

## 2014-10-26 ENCOUNTER — Other Ambulatory Visit: Payer: Self-pay | Admitting: Internal Medicine

## 2014-12-07 ENCOUNTER — Ambulatory Visit (INDEPENDENT_AMBULATORY_CARE_PROVIDER_SITE_OTHER): Payer: BC Managed Care – PPO | Admitting: Internal Medicine

## 2014-12-07 ENCOUNTER — Encounter: Payer: Self-pay | Admitting: Internal Medicine

## 2014-12-07 VITALS — BP 120/62 | HR 58 | Ht 66.0 in | Wt 156.6 lb

## 2014-12-07 DIAGNOSIS — I481 Persistent atrial fibrillation: Secondary | ICD-10-CM

## 2014-12-07 DIAGNOSIS — I421 Obstructive hypertrophic cardiomyopathy: Secondary | ICD-10-CM | POA: Diagnosis not present

## 2014-12-07 DIAGNOSIS — I4819 Other persistent atrial fibrillation: Secondary | ICD-10-CM

## 2014-12-07 NOTE — Patient Instructions (Signed)

## 2014-12-09 NOTE — Progress Notes (Signed)
Electrophysiology Office Note   Date:  12/09/2014   ID:  Ronnie Black, DOB Jun 20, 1945, MRN [id number]  PCP:  [name], MD   Primary Electrophysiologist: [name], MD    Chief Complaint  Patient presents with  . Follow-up    afib     History of Present Illness: Ronnie Black is a 69 y.o. male who presents today for electrophysiology evaluation.   He is doing well.  He recently had afib which he attributed to swimming.  None since.  Today, he denies symptoms of  chest pain, shortness of breath, orthopnea, PND, lower extremity edema, claudication, dizziness, presyncope, syncope, bleeding, or neurologic sequela. The patient is tolerating medications without difficulties and is otherwise without complaint today.    Past Medical History  Diagnosis Date  . Hypertrophic obstructive cardiomyopathy     without significant obstruction  . Persistent atrial fibrillation ([place])     s/p PVI 3/12  . HLD (hyperlipidemia)   . DDD (degenerative disc disease)    Past Surgical History  Procedure Laterality Date  . Appendectomy    . Atrial fibrillation ablation  03/27/10    PVI with CTI ablation by JA  . Wisdom tooth extraction    . Skin cancer excision    . Cardioversion    . Tee without cardioversion  04/26/2011    Procedure: TRANSESOPHAGEAL ECHOCARDIOGRAM (TEE);  Surgeon: Larey Dresser, MD;  Location: [facility] ENDOSCOPY;  Service: Cardiovascular;  Laterality: N/A;  to be done at 1330  . Cardioversion  04/26/2011    Procedure: CARDIOVERSION;  Surgeon: Larey Dresser, MD;  Location: [facility] ENDOSCOPY;  Service: Cardiovascular;  Laterality: N/A;  . Tee without cardioversion N/A 04/28/2012    Procedure: TRANSESOPHAGEAL ECHOCARDIOGRAM (TEE);  Surgeon: Thayer Headings, MD;  Location: [place];  Service: Cardiovascular;  Laterality: N/A;  . Cardioversion N/A 04/28/2012    Procedure: CARDIOVERSION;  Surgeon: Thayer Headings, MD;  Location: [place];  Service: Cardiovascular;  Laterality:  N/A;  . Cardioversion N/A 07/28/2012    Procedure: CARDIOVERSION;  Surgeon: Lelon Perla, MD;  Location: [place];  Service: Cardiovascular;  Laterality: N/A;  . Cardioversion N/A 08/14/2012    Procedure: CARDIOVERSION;  Surgeon: Josue Hector, MD;  Location: [place];  Service: Cardiovascular;  Laterality: N/A;  . Cardioversion N/A 08/11/2014    Procedure: CARDIOVERSION;  Surgeon: Fay Records, MD;  Location: [place];  Service: Cardiovascular;  Laterality: N/A;  . Cardioversion N/A 08/26/2014    Procedure: CARDIOVERSION;  Surgeon: Sanda Klein, MD;  Location: [facility] ENDOSCOPY;  Service: Cardiovascular;  Laterality: N/A;     Current Outpatient Prescriptions  Medication Sig Dispense Refill  . atorvastatin (LIPITOR) 10 MG tablet take 1 tablet by mouth once daily 30 tablet 6  . disopyramide (NORPACE CR) 150 MG 12 hr capsule Take 2 capsules (300 mg total) by mouth 2 (two) times daily. 120 capsule 3  . fish oil-omega-3 fatty acids 1000 MG capsule Take 1 g by mouth 3 (three) times a week.     Marland Kitchen ibuprofen (ADVIL,MOTRIN) 200 MG tablet Take 200 mg by mouth every 6 (six) hours as needed for moderate pain.     . Loratadine (CLARITIN) 10 MG CAPS Take 10 mg by mouth as needed (allergies).     . metoprolol tartrate (LOPRESSOR) 25 MG tablet take 1 tablet by mouth twice a day 60 tablet 3  . Multiple Vitamin (MULTIVITAMIN) tablet Take 1 tablet by mouth 3 (three) times a week.     Marland Kitchen  PRADAXA 150 MG CAPS capsule take 1 capsule by mouth every 12 hours (Patient taking differently: Take 150 mg by mouth twice daily) 60 capsule 10  . sildenafil (VIAGRA) 25 MG tablet Take 25 mg by mouth as needed for erectile dysfunction.     No current facility-administered medications for this visit.    Allergies:   Review of patient's allergies indicates no known allergies.   Social History:  The patient  reports that he quit smoking about 39 years ago. His smoking use included Cigarettes. He has a 15 pack-year  smoking history. He does not have any smokeless tobacco history on file. He reports that he drinks about 3.6 oz of alcohol per week. He reports that he does not use illicit drugs.   Family History:  The patient's  family history includes Coronary artery disease in an other family member.    ROS:  Please see the history of present illness.   All other systems are reviewed and negative.    PHYSICAL EXAM: VS:  BP 120/62 mmHg  Pulse 58  Ht 5\' 6"  (1.676 m)  Wt 156 lb 9.6 oz (71.033 kg)  BMI 25.29 kg/m2  SpO2 96% , BMI Body mass index is 25.29 kg/(m^2). GEN: Well nourished, well developed, in no acute distress HEENT: normal Neck: no JVD, carotid bruits, or masses Cardiac: RRR; no murmurs, rubs, or gallops,no edema  Respiratory:  clear to auscultation bilaterally, normal work of breathing GI: soft, nontender, nondistended, + BS MS: no deformity or atrophy Skin: warm and dry  Neuro:  Strength and sensation are intact Psych: euthymic mood, full affect  EKG:  EKG is ordered today. The ekg ordered today shows sinus rhythm with chronic T wave changes, Qtc 447 msec   Recent Labs: 08/09/2014: Magnesium 2.3; TSH 4.321 08/22/2014: BUN 15; Creatinine, Ser 0.96; Hemoglobin 16.7; Platelets 231; Potassium 4.3; Sodium 137    Lipid Panel  No results found for: CHOL, TRIG, HDL, CHOLHDL, VLDL, LDLCALC, LDLDIRECT   Wt Readings from Last 3 Encounters:  12/07/14 156 lb 9.6 oz (71.033 kg)  09/08/14 156 lb 6.4 oz (70.943 kg)  08/26/14 150 lb (68.04 kg)        ASSESSMENT AND PLAN:  1.  Persistent afib Maintaining sinus No changes today He would like to try to wean norpace over the next few months to 150mg  BID. Continue long term anticoagulation with pradaxa  2. Hypertrophic CM Stable No changes  Return to see me in 12 months  Current medicines are reviewed at length with the patient today.   The patient does not have concerns regarding his medicines.  The following changes were made  today:  none Signed,  [name], MD    [address] [place] [place] [address] [phone] (office) [phone] (fax)

## 2014-12-27 ENCOUNTER — Other Ambulatory Visit (HOSPITAL_COMMUNITY): Payer: Self-pay | Admitting: Nurse Practitioner

## 2015-02-23 ENCOUNTER — Other Ambulatory Visit: Payer: Self-pay | Admitting: Internal Medicine

## 2015-02-27 ENCOUNTER — Telehealth: Payer: Self-pay | Admitting: Internal Medicine

## 2015-02-27 NOTE — Telephone Encounter (Signed)
New Message  Pt c/o medication issue: 1. Name of Medication:PRADAXA 150 MG CAPS capsule   4. What is your medication issue? Will need a prior auth. something has changed with insurance  Pt would also like to speak with Claiborne Billings because there is a little issue of how it will need to be dated. If it doesn't need to be re-routed to her the pt states he will be just fine.

## 2015-02-28 NOTE — Telephone Encounter (Signed)
Patient informed that his Pradaxa 150mg  has been approved.

## 2015-03-14 ENCOUNTER — Other Ambulatory Visit: Payer: Self-pay | Admitting: *Deleted

## 2015-03-14 MED ORDER — DISOPYRAMIDE PHOSPHATE ER 150 MG PO CP12: 300.0000 mg | ORAL_CAPSULE | Freq: Two times a day (BID) | ORAL | Status: DC

## 2015-03-15 ENCOUNTER — Telehealth: Payer: Self-pay | Admitting: Internal Medicine

## 2015-03-15 NOTE — Telephone Encounter (Signed)
Spoke with patient's wife this has something to do with the medication being drop shipped from [employer] straight to patient's home and the date the medication expires.

## 2015-03-15 NOTE — Telephone Encounter (Signed)
Needs a letter of necessity for his Nor Pace CR 150 mg.

## 2015-03-16 NOTE — Telephone Encounter (Signed)
Spoke with Santiago Glad with [employer].  Informed that letter of medical necessity was no longer required.  [facility] would just need to request drop shipment from [place].  Spoke with [place] with [employer] again.  She stated [employer] gave her the wrong information yesterday and so she has already requested the drop shipment for this week. LMOM for pt to inform them everything should be taken care of now.

## 2015-03-16 NOTE — Telephone Encounter (Signed)
Faxed letter of medical necessity to [place] to get okay of shipment to pharmacy.

## 2015-04-24 ENCOUNTER — Other Ambulatory Visit: Payer: Self-pay

## 2015-04-24 MED ORDER — DABIGATRAN ETEXILATE MESYLATE 150 MG PO CAPS: ORAL_CAPSULE | ORAL | Status: DC

## 2015-05-23 ENCOUNTER — Other Ambulatory Visit: Payer: Self-pay

## 2015-05-23 MED ORDER — ATORVASTATIN CALCIUM 10 MG PO TABS: 10.0000 mg | ORAL_TABLET | Freq: Every day | ORAL | Status: DC

## 2015-05-23 NOTE — Telephone Encounter (Signed)
[name], MD at 12/09/2014 11:49 AM  atorvastatin (LIPITOR) 10 MG tablettake 1 tablet by mouth once daily Current medicines are reviewed at length with the patient today.  The patient does not have concerns regarding his medicines. The following changes were made today: none Signed,  [name], MD

## 2015-07-19 ENCOUNTER — Other Ambulatory Visit: Payer: Self-pay | Admitting: Internal Medicine

## 2015-07-19 MED ORDER — ATORVASTATIN CALCIUM 10 MG PO TABS
10.0000 mg | ORAL_TABLET | Freq: Every day | ORAL | Status: DC
Start: 2015-07-19 — End: 2015-11-22

## 2015-10-26 ENCOUNTER — Encounter: Payer: Self-pay | Admitting: Internal Medicine

## 2015-10-26 LAB — LIPID PANEL
Cholesterol: 167 (ref 0–200)
HDL: 47 (ref 35–70)
LDL Cholesterol: 98
Triglycerides: 111 (ref 40–160)

## 2015-11-09 ENCOUNTER — Encounter: Payer: Self-pay | Admitting: Internal Medicine

## 2015-11-22 ENCOUNTER — Encounter: Payer: Self-pay | Admitting: Internal Medicine

## 2015-11-22 ENCOUNTER — Ambulatory Visit (INDEPENDENT_AMBULATORY_CARE_PROVIDER_SITE_OTHER): Payer: BC Managed Care – PPO | Admitting: Internal Medicine

## 2015-11-22 VITALS — BP 138/80 | HR 69 | Ht 66.0 in | Wt 159.0 lb

## 2015-11-22 DIAGNOSIS — I481 Persistent atrial fibrillation: Secondary | ICD-10-CM

## 2015-11-22 DIAGNOSIS — I4819 Other persistent atrial fibrillation: Secondary | ICD-10-CM

## 2015-11-22 DIAGNOSIS — I421 Obstructive hypertrophic cardiomyopathy: Secondary | ICD-10-CM

## 2015-11-22 MED ORDER — ATORVASTATIN CALCIUM 10 MG PO TABS: 10.0000 mg | ORAL_TABLET | Freq: Every day | ORAL | 11 refills | Status: DC

## 2015-11-22 MED ORDER — METOPROLOL TARTRATE 25 MG PO TABS: 25.0000 mg | ORAL_TABLET | Freq: Two times a day (BID) | ORAL | 11 refills | Status: DC

## 2015-11-22 MED ORDER — DISOPYRAMIDE PHOSPHATE ER 150 MG PO CP12: ORAL_CAPSULE | ORAL | 3 refills | Status: DC

## 2015-11-22 MED ORDER — DABIGATRAN ETEXILATE MESYLATE 150 MG PO CAPS: ORAL_CAPSULE | ORAL | 11 refills | Status: DC

## 2015-11-22 NOTE — Progress Notes (Signed)
Electrophysiology Office Note   Date:  11/22/2015   ID:  Ronnie Black, DOB Apr 26, 1945, MRN [id number]  PCP:  [name], MD   Primary Electrophysiologist: [name], MD    Chief Complaint  Patient presents with  . Atrial Fibrillation     History of Present Illness: Ronnie Black is a 70 y.o. male who presents today for electrophysiology evaluation.   He is doing well.  No afib over the past year.  Today, he denies symptoms of  chest pain, shortness of breath, orthopnea, PND, lower extremity edema, claudication, dizziness, presyncope, syncope, bleeding, or neurologic sequela. The patient is tolerating medications without difficulties and is otherwise without complaint today.    Past Medical History:  Diagnosis Date  . DDD (degenerative disc disease)   . HLD (hyperlipidemia)   . 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 Persistent atrial fibrillation ([place])    s/p PVI 3/12   Past Surgical History:  Procedure Laterality Date  . APPENDECTOMY    . atrial fibrillation ablation  03/27/10   PVI with CTI ablation by JA  . CARDIOVERSION    . CARDIOVERSION  04/26/2011   Procedure: CARDIOVERSION;  Surgeon: Larey Dresser, MD;  Location: [place];  Service: Cardiovascular;  Laterality: N/A;  . CARDIOVERSION N/A 04/28/2012   Procedure: CARDIOVERSION;  Surgeon: Thayer Headings, MD;  Location: [place];  Service: Cardiovascular;  Laterality: N/A;  . CARDIOVERSION N/A 07/28/2012   Procedure: CARDIOVERSION;  Surgeon: Lelon Perla, MD;  Location: [facility] ENDOSCOPY;  Service: Cardiovascular;  Laterality: N/A;  . CARDIOVERSION N/A 08/14/2012   Procedure: CARDIOVERSION;  Surgeon: Josue Hector, MD;  Location: [facility] ENDOSCOPY;  Service: Cardiovascular;  Laterality: N/A;  . CARDIOVERSION N/A 08/11/2014   Procedure: CARDIOVERSION;  Surgeon: Fay Records, MD;  Location: [place];  Service: Cardiovascular;  Laterality: N/A;  . CARDIOVERSION N/A 08/26/2014   Procedure: CARDIOVERSION;  Surgeon: Sanda Klein, MD;  Location: [facility] ENDOSCOPY;  Service: Cardiovascular;  Laterality: N/A;  . SKIN CANCER EXCISION    . TEE WITHOUT CARDIOVERSION  04/26/2011   Procedure: TRANSESOPHAGEAL ECHOCARDIOGRAM (TEE);  Surgeon: Larey Dresser, MD;  Location: [facility] ENDOSCOPY;  Service: Cardiovascular;  Laterality: N/A;  to be done at 1330  . TEE WITHOUT CARDIOVERSION N/A 04/28/2012   Procedure: TRANSESOPHAGEAL ECHOCARDIOGRAM (TEE);  Surgeon: Thayer Headings, MD;  Location: [place];  Service: Cardiovascular;  Laterality: N/A;  . WISDOM TOOTH EXTRACTION       Current Outpatient Prescriptions  Medication Sig Dispense Refill  . atorvastatin (LIPITOR) 10 MG tablet Take 1 tablet (10 mg total) by mouth daily. 30 tablet 4  . dabigatran (PRADAXA) 150 MG CAPS capsule take 1 capsule by mouth every 12 hours 60 capsule 7  . disopyramide (NORPACE CR) 150 MG 12 hr capsule Take 150 mg by mouth 2 (two) times daily.    . fish oil-omega-3 fatty acids 1000 MG capsule Take 1 g by mouth 3 (three) times a week.     Marland Kitchen ibuprofen (ADVIL,MOTRIN) 200 MG tablet Take 200 mg by mouth every 6 (six) hours as needed for moderate pain.     . Loratadine (CLARITIN) 10 MG CAPS Take 10 mg by mouth daily as needed (allergies).     . metoprolol tartrate (LOPRESSOR) 25 MG tablet take 1 tablet by mouth twice a day 60 tablet 9  . Multiple Vitamin (MULTIVITAMIN) tablet Take 1 tablet by mouth 3 (three) times a week.     Marland Kitchen  sildenafil (VIAGRA) 25 MG tablet Take 25 mg by mouth as needed for erectile dysfunction. Take as directed     No current facility-administered medications for this visit.     Allergies:   Review of patient's allergies indicates no known allergies.   Social History:  The patient  reports that he quit smoking about 40 years ago. His smoking use included Cigarettes. He has a 15.00 pack-year smoking history. He has never used smokeless tobacco. He reports that he drinks about 3.6 oz of alcohol per  week . He reports that he does not use drugs.   ROS:  Please see the history of present illness.   All other systems are reviewed and negative.   PHYSICAL EXAM: VS:  BP 138/80   Pulse 69   Ht 5\' 6"  (1.676 m)   Wt 159 lb (72.1 kg)   BMI 25.66 kg/m  , BMI Body mass index is 25.66 kg/m. GEN: Well nourished, well developed, in no acute distress  HEENT: normal  Neck: no JVD, carotid bruits, or masses Cardiac: RRR; no murmurs, rubs, or gallops,no edema  Respiratory:  clear to auscultation bilaterally, normal work of breathing GI: soft, nontender, nondistended, + BS MS: no deformity or atrophy  Skin: warm and dry  Neuro:  Strength and sensation are intact Psych: euthymic mood, full affect  EKG:  EKG is ordered today. The ekg ordered today shows sinus rhythm with PACs and PVCs, with chronic T wave changes, Qtc 443 msec  Labs from prior care reviewed  Wt Readings from Last 3 Encounters:  11/22/15 159 lb (72.1 kg)  12/07/14 156 lb 9.6 oz (71 kg)  09/08/14 156 lb 6.4 oz (70.9 kg)     ASSESSMENT AND PLAN:  1.  Persistent afib Maintaining sinus No changes today Continue long term anticoagulation with pradaxa He finds that he can occasional take norpace 150mg  BID but does at times take more and is comforted by the ability to take then when needed.  2. Hypertrophic CM Stable No changes  Return to see me in 12 months  Current medicines are reviewed at length with the patient today.   The patient does not have concerns regarding his medicines.  The following changes were made today:  none Signed,  [name], MD    [place] [address] [phone] (office) [phone] (fax)

## 2015-11-22 NOTE — Patient Instructions (Addendum)
Medication Instructions:  Your physician recommends that you continue on your current medications as directed. Please refer to the Current Medication list given to you today.  Ok to take 1-2 tablets twice daily of Norpace if needed   Labwork: None ordered   Testing/Procedures: None ordered   Follow-Up: Your physician wants you to follow-up in: 12 months with Dr Vallery Ridge will receive a reminder letter in the mail two months in advance. If you don't receive a letter, please call our office to schedule the follow-up appointment.   Any Other Special Instructions Will Be Listed Below (If Applicable).     If you need a refill on your cardiac medications before your next appointment, please call your pharmacy.

## 2016-01-16 ENCOUNTER — Encounter (HOSPITAL_COMMUNITY): Payer: Self-pay

## 2016-01-16 ENCOUNTER — Telehealth: Payer: Self-pay | Admitting: Internal Medicine

## 2016-01-16 ENCOUNTER — Emergency Department (HOSPITAL_COMMUNITY)
Admission: EM | Admit: 2016-01-16 | Discharge: 2016-01-16 | Disposition: A | Discharge status: Home / Self Care | Payer: BC Managed Care – PPO | Attending: Emergency Medicine | Admitting: Emergency Medicine

## 2016-01-16 ENCOUNTER — Emergency Department (HOSPITAL_COMMUNITY): Payer: BC Managed Care – PPO

## 2016-01-16 DIAGNOSIS — Z87891 Personal history of nicotine dependence: Secondary | ICD-10-CM | POA: Diagnosis not present

## 2016-01-16 DIAGNOSIS — I4891 Unspecified atrial fibrillation: Secondary | ICD-10-CM | POA: Diagnosis present

## 2016-01-16 LAB — BASIC METABOLIC PANEL
Anion gap: 8 (ref 5–15)
BUN: 13 mg/dL (ref 6–20)
CO2: 27 mmol/L (ref 22–32)
Calcium: 9.5 mg/dL (ref 8.9–10.3)
Chloride: 104 mmol/L (ref 101–111)
Creatinine, Ser: 1.04 mg/dL (ref 0.61–1.24)
GFR calc Af Amer: >60 mL/min (ref >60)
GFR calc non Af Amer: >60 mL/min (ref >60)
Glucose, Bld: 92 mg/dL (ref 65–99)
Potassium: 4.2 mmol/L (ref 3.5–5.1)
Sodium: 139 mmol/L (ref 135–145)

## 2016-01-16 LAB — CBC
HCT: 47.7 % (ref 39.0–52.0)
Hemoglobin: 16.5 g/dL (ref 13.0–17.0)
MCH: 30.3 pg (ref 26.0–34.0)
MCHC: 34.6 g/dL (ref 30.0–36.0)
MCV: 87.7 fL (ref 78.0–100.0)
Platelets: 206 10*3/uL (ref 150–400)
RBC: 5.44 MIL/uL (ref 4.22–5.81)
RDW: 12.9 % (ref 11.5–15.5)
WBC: 6.6 10*3/uL (ref 4.0–10.5)

## 2016-01-16 MED ORDER — MIDAZOLAM HCL 2 MG/2ML IJ SOLN
2.0000 mg | Freq: Once | INTRAMUSCULAR | Status: AC
  Administered 2016-01-16: 2 mg via INTRAVENOUS
  Filled 2016-01-16: qty 2

## 2016-01-16 MED ORDER — DABIGATRAN ETEXILATE MESYLATE 150 MG PO CAPS
150.0000 mg | ORAL_CAPSULE | Freq: Once | ORAL | Status: AC
  Administered 2016-01-16: 150 mg via ORAL
  Filled 2016-01-16: qty 1

## 2016-01-16 NOTE — ED Notes (Signed)
Pt sedated with 2mg  Versed IV- shocked with 200J. Dr. Vanita Panda and Resident at bedside. Pt vs remained stable. No complications. Pt now NSR

## 2016-01-16 NOTE — Telephone Encounter (Signed)
Called patient back. Patient is not sure if he is in A. Fib or not, but would like to come into the office to get an EKG today. Patient stated he is suppose to go out of town tomorrow. Spoke with Melina Copa PA about patient coming in to see her. Melina Copa PA suggest if patient has been taking his Pradaxa and thinks he is in A. FIB, that he should go to the ED. If patient goes to ED and he is in A. FIB they would be able to do a cardioversion in the ED. Informed patient that he could be seen in the office this afternoon, but if he is in A. FIB that we would have to schedule a outpatient cardioversion, which could possibly change his schedule for going out of town. Patient verbalized understanding and patient stated he would go to ED.

## 2016-01-16 NOTE — ED Provider Notes (Signed)
[facility] DEPT Provider Note   CSN: [id number] Arrival date & time: 01/16/16  1328     History   Chief Complaint Chief Complaint  Patient presents with  . Atrial Fibrillation    HPI Ronnie Black is a 70 y.o. male.  The history is provided by the patient, the spouse and medical records. No language interpreter was used.     Patient presents today with concern for atrial fibrillation. He has a known history of persistent atrial fibrillation which is controlled with Norpace and he is on Pradaxa at baseline. States that last night felt that his pulse was irregular. Called his cardiology offices this morning and they recommended coming to the emergency department for consideration of cardioversion. This has been confirmed with reviewed the patient's notes in our system. Patient denies any recent fevers, chills, chest pain, shortness breath, nausea, vomiting, diarrhea. States that she is going out of town for the new year and would like to get this sorted out before he goes to [place]. No known triggers for but amended this rhythm. States he has been compliant with his Pradaxa with last dose earlier this morning.   Past Medical History:  Diagnosis Date  . DDD (degenerative disc disease)   . HLD (hyperlipidemia)   . 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 Persistent atrial fibrillation ([place])    s/p PVI 3/12    Patient Active Problem List   Diagnosis Date Noted  . Persistent atrial fibrillation ([place])   . ATRIAL FLUTTER 08/21/2009  . ABNORMAL EKG 08/21/2009  . CAROTID BRUIT 08/24/2008  . HYPERLIPIDEMIA-MIXED 08/13/2008  . 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 Atrial fibrillation ([place]) 08/13/2008    Past Surgical History:  Procedure Laterality Date  . APPENDECTOMY    . atrial fibrillation ablation  03/27/10   PVI with CTI ablation by JA  . CARDIOVERSION    . CARDIOVERSION  04/26/2011   Procedure: CARDIOVERSION;  Surgeon:  Larey Dresser, MD;  Location: [facility];  Service: Cardiovascular;  Laterality: N/A;  . CARDIOVERSION N/A 04/28/2012   Procedure: CARDIOVERSION;  Surgeon: Thayer Headings, MD;  Location: [facility];  Service: Cardiovascular;  Laterality: N/A;  . CARDIOVERSION N/A 07/28/2012   Procedure: CARDIOVERSION;  Surgeon: Lelon Perla, MD;  Location: [facility] ENDOSCOPY;  Service: Cardiovascular;  Laterality: N/A;  . CARDIOVERSION N/A 08/14/2012   Procedure: CARDIOVERSION;  Surgeon: Josue Hector, MD;  Location: [facility] ENDOSCOPY;  Service: Cardiovascular;  Laterality: N/A;  . CARDIOVERSION N/A 08/11/2014   Procedure: CARDIOVERSION;  Surgeon: Fay Records, MD;  Location: [place];  Service: Cardiovascular;  Laterality: N/A;  . CARDIOVERSION N/A 08/26/2014   Procedure: CARDIOVERSION;  Surgeon: Sanda Klein, MD;  Location: [facility] ENDOSCOPY;  Service: Cardiovascular;  Laterality: N/A;  . SKIN CANCER EXCISION    . TEE WITHOUT CARDIOVERSION  04/26/2011   Procedure: TRANSESOPHAGEAL ECHOCARDIOGRAM (TEE);  Surgeon: Larey Dresser, MD;  Location: [facility] ENDOSCOPY;  Service: Cardiovascular;  Laterality: N/A;  to be done at 1330  . TEE WITHOUT CARDIOVERSION N/A 04/28/2012   Procedure: TRANSESOPHAGEAL ECHOCARDIOGRAM (TEE);  Surgeon: Thayer Headings, MD;  Location: [facility];  Service: Cardiovascular;  Laterality: N/A;  . WISDOM TOOTH EXTRACTION         Home Medications    Prior to Admission medications   Medication Sig Start Date End Date Taking? Authorizing Provider  atorvastatin (LIPITOR) 10 MG tablet Take 1 tablet (10 mg total) by mouth daily. 11/22/15  Yes [name],  MD  dabigatran (PRADAXA) 150 MG CAPS capsule take 1 capsule by mouth every 12 hours 11/22/15  Yes [name], MD  disopyramide (NORPACE CR) 150 MG 12 hr capsule Take 1-2 tablets twice daily 11/22/15  Yes [name], MD  fish oil-omega-3 fatty acids 1000 MG capsule Take 1 g by mouth 3 (three) times a week.    Yes Historical Provider, MD  ibuprofen  (ADVIL,MOTRIN) 200 MG tablet Take 200 mg by mouth every 6 (six) hours as needed for moderate pain.    Yes Historical Provider, MD  Loratadine (CLARITIN) 10 MG CAPS Take 10 mg by mouth daily as needed (allergies).    Yes Historical Provider, MD  metoprolol tartrate (LOPRESSOR) 25 MG tablet Take 1 tablet (25 mg total) by mouth 2 (two) times daily. 11/22/15  Yes [name], MD  Multiple Vitamin (MULTIVITAMIN) tablet Take 1 tablet by mouth 3 (three) times a week.    Yes Historical Provider, MD  sildenafil (VIAGRA) 25 MG tablet Take 25 mg by mouth as needed for erectile dysfunction. Take as directed   Yes Historical Provider, MD    Family History Family History  Problem Relation Age of Onset  . Coronary artery disease      Social History Social History  Substance Use Topics  . Smoking status: Former Smoker    Packs/day: 1.00    Years: 15.00    Types: Cigarettes    Quit date: 11/28/1975  . Smokeless tobacco: Never Used  . Alcohol use 3.6 oz/week    3 Glasses of wine, 3 Cans of beer per week     Comment: rare wine     Allergies   Patient has no known allergies.   Review of Systems Review of Systems  Constitutional: Negative for chills and fever.  HENT: Negative for ear pain and sore throat.   Respiratory: Negative for cough and shortness of breath.   Cardiovascular: Positive for palpitations (has had irregular pulse on checks at home). Negative for chest pain.  Gastrointestinal: Negative for abdominal pain and vomiting.  Genitourinary: Negative for dysuria and hematuria.  Musculoskeletal: Negative for arthralgias and back pain.  Skin: Negative for color change and rash.  Neurological: Negative for seizures and syncope.  All other systems reviewed and are negative.    Physical Exam Updated Vital Signs BP 140/83   Pulse 60   Temp 97.5 F (36.4 C) (Oral)   Resp 19   SpO2 97%   Physical Exam  Constitutional: He appears well-developed and well-nourished. No distress.    HENT:  Head: Normocephalic and atraumatic.  Eyes: Conjunctivae and EOM are normal.  Neck: Normal range of motion. Neck supple.  Cardiovascular: Normal rate.  An irregularly irregular rhythm present.  No murmur heard. Pulmonary/Chest: Effort normal and breath sounds normal. No respiratory distress.  Abdominal: Soft. There is no tenderness.  Musculoskeletal: He exhibits no edema.  Neurological: He is alert.  Skin: Skin is warm and dry. He is not diaphoretic.  Psychiatric: He has a normal mood and affect.  Nursing note and vitals reviewed.    ED Treatments / Results  Labs (all labs ordered are listed, but only abnormal results are displayed) Labs Reviewed  BASIC METABOLIC PANEL  CBC    EKG  EKG Interpretation  Date/Time:  Tuesday January 16 2016 13:32:34 EST Ventricular Rate:  60 PR Interval:    QRS Duration: 100 QT Interval:  434 QTC Calculation: 434 R Axis:   15 Text Interpretation:  Atrial fibrillation ST-t wave abnormality  Artifact Abnormal ekg Confirmed by Carmin Muskrat  MD [phone]) on 01/16/2016 6:20:50 PM       Radiology Dg Chest 2 View  Result Date: 01/16/2016 CLINICAL DATA:  Atrial fibrillation EXAM: CHEST  2 VIEW COMPARISON:  12/17/2011 FINDINGS: Heart size upper normal. Negative for heart failure. Lungs are clear without infiltrate effusion or mass. Apical pleural scarring bilaterally. No change from prior study. IMPRESSION: No active cardiopulmonary disease. Electronically Signed   By: [name] M.D.   On: 01/16/2016 14:46    Procedures .Cardioversion Date/Time: 01/16/2016 8:37 PM Performed by: Theodosia Quay Authorized by: Carmin Muskrat   Consent:    Consent obtained:  Written   Consent given by:  Patient   Risks discussed:  Induced arrhythmia, pain and death   Alternatives discussed:  Anti-coagulation medication, delayed treatment and alternative treatment Pre-procedure details:    Cardioversion basis:  Elective   Rhythm:  Atrial  fibrillation   Electrode placement:  Anterior-posterior Attempt one:    Cardioversion mode:  Synchronous   Waveform:  Biphasic   Shock (Joules):  200   Shock outcome:  Conversion to normal sinus rhythm Post-procedure details:    Patient status:  Awake   Patient tolerance of procedure:  Tolerated well, no immediate complications  .Sedation Date/Time: 01/16/2016 11:25 PM Performed by: Theodosia Quay Authorized by: Theodosia Quay   Consent:    Consent obtained:  Written   Consent given by:  Patient   Risks discussed:  Inadequate sedation   Alternatives discussed:  Analgesia without sedation Indications:    Procedure performed:  Cardioversion   Procedure necessitating sedation performed by:  Physician performing sedation   Intended level of sedation:  Moderate (conscious sedation) Pre-sedation assessment:    ASA classification: class 1 - normal, healthy patient     Neck mobility: normal     History of difficult intubation: no   Immediate pre-procedure details:    Reviewed: vital signs     Verified: bag valve mask available, intubation equipment available and IV patency confirmed   Procedure details (see MAR for exact dosages):    Preoxygenation:  Nonrebreather mask   Sedation:  Midazolam   Intra-procedure monitoring:  Blood pressure monitoring and cardiac monitor   Intra-procedure events: none   Post-procedure details:    Patient is stable for discharge or admission: yes     Patient tolerance:  Tolerated well, no immediate complications   (including critical care time)  Medications Ordered in ED Medications  dabigatran (PRADAXA) capsule 150 mg (150 mg Oral Given 01/16/16 2036)  midazolam (VERSED) injection 2 mg (2 mg Intravenous Given 01/16/16 1944)     Initial Impression / Assessment and Plan / ED Course  I have reviewed the triage vital signs and the nursing notes.  Pertinent labs & imaging results that were available during my care of the patient were reviewed by me  and considered in my medical decision making (see chart for details).  Clinical Course     48 M p/w recurrence of atrial fibrillation. Sent by cardiology for consideration of cardioversion. Pt hemodynamically stable. Discussed with cardiology who is agreeable with cardioversion here and d/c if successfully converted and awake. Procedure as noted above, patient tolerated this well. On re-evaluation, patient awake and remains in NSR on monitor. Discharged home in good condition. Return precautions discussed with patient and wife at time of discharge.   Final Clinical Impressions(s) / ED Diagnoses   Final diagnoses:  Atrial fibrillation, unspecified type [facility])    New Prescriptions  Discharge Medication List as of 01/16/2016  8:39 PM       Theodosia Quay, MD 01/16/16 [place], MD 01/17/16 [phone]

## 2016-01-16 NOTE — ED Notes (Signed)
Patient placed on zoll pads and crash cart at bedside

## 2016-01-16 NOTE — ED Notes (Signed)
Patient brought back to room with family in tow; patient undressed, in gown, on monitor, continuous pulse oximetry and blood pressure cuff; visitor at bedside

## 2016-01-16 NOTE — Telephone Encounter (Signed)
New Message  Patient c/o Palpitations:  High priority if patient c/o lightheadedness and shortness of breath.  1. How long have you been having palpitations? Started last evening  2. Are you currently experiencing lightheadedness and shortness of breath? No  3. Have you checked your BP and heart rate? (document readings) Heart rate around 75  4. Are you experiencing any other symptoms? Arrhythmia

## 2016-01-16 NOTE — ED Notes (Signed)
Dr. Vanita Panda at bedside explaining procedure. Pt and spouse understand. Consennt signed. Pt on ZOL

## 2016-01-16 NOTE — ED Triage Notes (Signed)
Pt presents for evaluation of possible cardiac arrythmias. Pt reports hx of afib, taking pradaxa for blood thinner. Pt. Reports last evening he "felt off" and rate was irregular. Pt AxO x4, denies SOB/chest pain.

## 2016-01-16 NOTE — Discharge Instructions (Signed)
Return to the ER if you develop symptoms of light-headedness, chest pain, shortness of breath, or other worrisome signs or symptoms.

## 2016-01-18 ENCOUNTER — Telehealth (HOSPITAL_COMMUNITY): Payer: Self-pay | Admitting: *Deleted

## 2016-01-18 NOTE — Telephone Encounter (Signed)
LMOM for pt to call our office for follow up to cardioversion in the ED.

## 2016-01-31 ENCOUNTER — Ambulatory Visit (HOSPITAL_COMMUNITY)
Admission: RE | Admit: 2016-01-31 | Discharge: 2016-01-31 | Disposition: A | Discharge status: Home / Self Care | Payer: BC Managed Care – PPO | Source: Ambulatory Visit | Attending: Nurse Practitioner | Admitting: Nurse Practitioner

## 2016-01-31 ENCOUNTER — Encounter (HOSPITAL_COMMUNITY): Payer: Self-pay | Admitting: Nurse Practitioner

## 2016-01-31 VITALS — BP 134/76 | HR 61 | Ht 66.0 in | Wt 157.0 lb

## 2016-01-31 DIAGNOSIS — E785 Hyperlipidemia, unspecified: Secondary | ICD-10-CM | POA: Insufficient documentation

## 2016-01-31 DIAGNOSIS — Z7901 Long term (current) use of anticoagulants: Secondary | ICD-10-CM | POA: Diagnosis not present

## 2016-01-31 DIAGNOSIS — I481 Persistent atrial fibrillation: Secondary | ICD-10-CM | POA: Insufficient documentation

## 2016-01-31 DIAGNOSIS — Z87891 Personal history of nicotine dependence: Secondary | ICD-10-CM | POA: Insufficient documentation

## 2016-01-31 DIAGNOSIS — I480 Paroxysmal atrial fibrillation: Secondary | ICD-10-CM

## 2016-01-31 DIAGNOSIS — Z79899 Other long term (current) drug therapy: Secondary | ICD-10-CM | POA: Diagnosis not present

## 2016-01-31 NOTE — Progress Notes (Signed)
Patient ID: Ronnie Black, male   DOB: 01-06-1946, 71 y.o.   MRN: [id number]     Primary Care Physician: [name], MD Referring Physician: Dr. Jimmey Ralph is a 71 y.o. male with a h/o afib ablation in 2012, Cardioversion x 4 in the past, 2013 and (2) in 2014 DCCV 7/21, with return of Atrial flutter 6 days later, and has historically been in SR  with norpace sice 2014. He went into afib for the last 2x  after swimming. His DCCV was successful but again after swimming, he returned to afib. He returned to clinic and decided to  increase  Norpace to 300 mg and had repeat DCCV on 8/5. He returns today in [place]. Has noticed a few palpitations but no sustained afib. Overall feels well. Repeat echo showed normal EF with moderate LVH.  F/u 01/31/16. He has done well for a long time without any afib. The day after Christmas and the day before leaving to go out of town, he returned to afib. He went to the ER and had successful cardioversion. He has had not further issues with afib. He increased his Norpace to 300 mg bid for several weeks but is now going to decrease back to 150 mg bid. He continues on pradaxa without missed doses.  Today, he denies symptoms of  chest pain, shortness of breath, orthopnea, PND, lower extremity edema, dizziness, presyncope, syncope, or neurologic sequela. Aware of mild palpitations, fatigue.The patient is tolerating medications without difficulties and is otherwise without complaint today.   Past Medical History:  Diagnosis Date  . DDD (degenerative disc disease)   . HLD (hyperlipidemia)   . 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 Persistent atrial fibrillation ([place])    s/p PVI 3/12   Past Surgical History:  Procedure Laterality Date  . APPENDECTOMY    . atrial fibrillation ablation  03/27/10   PVI with CTI ablation by JA  . CARDIOVERSION    . CARDIOVERSION  04/26/2011   Procedure: CARDIOVERSION;  Surgeon: Larey Dresser, MD;   Location: [place];  Service: Cardiovascular;  Laterality: N/A;  . CARDIOVERSION N/A 04/28/2012   Procedure: CARDIOVERSION;  Surgeon: Thayer Headings, MD;  Location: [place];  Service: Cardiovascular;  Laterality: N/A;  . CARDIOVERSION N/A 07/28/2012   Procedure: CARDIOVERSION;  Surgeon: Lelon Perla, MD;  Location[facility] ENDOSCOPY;  Service: Cardiovascular;  Laterality: N/A;  . CARDIOVERSION N/A 08/14/2012   Procedure: CARDIOVERSION;  Surgeon: Josue Hector, MD;  Location: [facility] ENDOSCOPY;  Service: Cardiovascular;  Laterality: N/A;  . CARDIOVERSION N/A 08/11/2014   Procedure: CARDIOVERSION;  Surgeon: Fay Records, MD;  Location: [place];  Service: Cardiovascular;  Laterality: N/A;  . CARDIOVERSION N/A 08/26/2014   Procedure: CARDIOVERSION;  Surgeon: Sanda Klein, MD;  Location: [facility] ENDOSCOPY;  Service: Cardiovascular;  Laterality: N/A;  . SKIN CANCER EXCISION    . TEE WITHOUT CARDIOVERSION  04/26/2011   Procedure: TRANSESOPHAGEAL ECHOCARDIOGRAM (TEE);  Surgeon: Larey Dresser, MD;  Location: [facility] ENDOSCOPY;  Service: Cardiovascular;  Laterality: N/A;  to be done at 1330  . TEE WITHOUT CARDIOVERSION N/A 04/28/2012   Procedure: TRANSESOPHAGEAL ECHOCARDIOGRAM (TEE);  Surgeon: Thayer Headings, MD;  Location: [place];  Service: Cardiovascular;  Laterality: N/A;  . WISDOM TOOTH EXTRACTION      Current Outpatient Prescriptions  Medication Sig Dispense Refill  . atorvastatin (LIPITOR) 10 MG tablet Take 1 tablet (10 mg total) by mouth daily. 30 tablet 11  .  calcium carbonate (TUMS - DOSED IN MG ELEMENTAL CALCIUM) 500 MG chewable tablet Chew 1 tablet by mouth daily.    . dabigatran (PRADAXA) 150 MG CAPS capsule take 1 capsule by mouth every 12 hours 60 capsule 11  . disopyramide (NORPACE CR) 150 MG 12 hr capsule Take 1-2 tablets twice daily (Patient taking differently: Take 2 tablets twice daily) 360 capsule 3  . fish oil-omega-3 fatty acids 1000 MG capsule Take 1 g by mouth 3 (three) times a  week.     Marland Kitchen ibuprofen (ADVIL,MOTRIN) 200 MG tablet Take 200 mg by mouth every 6 (six) hours as needed for moderate pain.     . Loratadine (CLARITIN) 10 MG CAPS Take 10 mg by mouth daily as needed (allergies).     . metoprolol tartrate (LOPRESSOR) 25 MG tablet Take 1 tablet (25 mg total) by mouth 2 (two) times daily. 60 tablet 11  . Multiple Vitamin (MULTIVITAMIN) tablet Take 1 tablet by mouth 3 (three) times a week.     . sildenafil (VIAGRA) 25 MG tablet Take 25 mg by mouth as needed for erectile dysfunction. Take as directed     No current facility-administered medications for this encounter.     No Known Allergies  Social History   Social History  . Marital status: Married    Spouse name: N/A  . Number of children: N/A  . Years of education: N/A   Occupational History  . professor [employer]   Social History Main Topics  . Smoking status: Former Smoker    Packs/day: 1.00    Years: 15.00    Types: Cigarettes    Quit date: 11/28/1975  . Smokeless tobacco: Never Used  . Alcohol use 3.6 oz/week    3 Glasses of wine, 3 Cans of beer per week     Comment: rare wine  . Drug use: No  . Sexual activity: Yes   Other Topics Concern  . Not on file   Social History Narrative    a professor of Therapist, occupational at [employer] in [place].    Family History  Problem Relation Age of Onset  . Coronary artery disease      ROS- All systems are reviewed and negative except as per the HPI above  Physical Exam: Vitals:   01/31/16 1536  BP: 134/76  Pulse: 61  Weight: 157 lb (71.2 kg)  Height: 5\' 6"  (1.676 m)    GEN- The patient is well appearing, alert and oriented x 3 today.   Head- normocephalic, atraumatic Eyes-  Sclera clear, conjunctiva pink Ears- hearing intact Oropharynx- clear Neck- supple, no JVP Lymph- no cervical lymphadenopathy Lungs- Clear to ausculation bilaterally, normal work of breathing Heart- Regular rate and rhythm, no murmurs, rubs or gallops, PMI not  laterally displaced GI- soft, NT, ND, + BS Extremities- no clubbing, cyanosis, or edema MS- no significant deformity or atrophy Skin- no rash or lesion Psych- euthymic mood, full affect Neuro- strength and sensation are intact  EKG-SR with 1st degree AVB with one recorded PVC. T wave abnormality unchanged prom previous, QRS 190ms,  QTc int at 463 ms. Echo- Left ventricle: The cavity size was normal. Wall thickness was increased in a pattern of moderate LVH. Systolic function was normal. The estimated ejection fraction was in the range of 55% to 60%. Wall motion was normal; there were no regional wall motion abnormalities. - Mitral valve: There was mild regurgitation. - Right atrium: The atrium was mildly to moderately dilated.  Assessment and Plan:  1. Persistent afib  Maintaining SR after last DCCV 12/26 with increase of norpace to 300 mg bid, but will decrease back to 150 mg bid. Continue pradaxa  150 mg bid   F/u with Dr. [name] in November 2017  Butch Penny C. Treasure Ingrum, [facility] [address] [place], [place] [address] [phone]

## 2016-02-04 ENCOUNTER — Encounter: Payer: Self-pay | Admitting: Internal Medicine

## 2016-02-28 ENCOUNTER — Encounter: Payer: Self-pay | Admitting: Internal Medicine

## 2016-03-11 ENCOUNTER — Telehealth: Payer: Self-pay

## 2016-03-11 NOTE — Telephone Encounter (Signed)
Pradaxa denied by [facility]. Form for appeal sent electronically on Cover My Meds. Patient must sign a letter of consent that needs to be faxed to them as well.

## 2016-04-25 ENCOUNTER — Encounter: Payer: Self-pay | Admitting: Internal Medicine

## 2016-04-25 NOTE — Telephone Encounter (Deleted)
Please call patient. Either would be ok. As he has enough to last until 4/25, we can wait until Dr [name] is back next week to get his opinion on which he would prefer the patient change to.    Chanetta Marshall, NP 04/25/2016 12:20 PM

## 2016-05-02 ENCOUNTER — Telehealth: Payer: Self-pay | Admitting: *Deleted

## 2016-05-02 MED ORDER — APIXABAN 5 MG PO TABS: 5.0000 mg | ORAL_TABLET | Freq: Two times a day (BID) | ORAL | 11 refills | Status: DC

## 2016-05-02 NOTE — Telephone Encounter (Signed)
Patient is going to change from Pradaxa to Eliquis 5 mg bid.  New rx sent in.

## 2016-08-29 ENCOUNTER — Telehealth: Payer: Self-pay | Admitting: Family Medicine

## 2016-08-29 NOTE — Telephone Encounter (Signed)
Pt called in and would like to know if you would be willing to take him and his wife on has new pt?

## 2016-08-30 NOTE — Telephone Encounter (Signed)
Yes, I will see both of them

## 2016-09-09 ENCOUNTER — Encounter: Payer: Self-pay | Admitting: Internal Medicine

## 2016-09-09 ENCOUNTER — Ambulatory Visit (INDEPENDENT_AMBULATORY_CARE_PROVIDER_SITE_OTHER): Payer: BC Managed Care – PPO | Admitting: Internal Medicine

## 2016-09-09 VITALS — BP 148/70 | HR 76 | Temp 98.6°F | Resp 16 | Ht 66.0 in | Wt 156.2 lb

## 2016-09-09 DIAGNOSIS — E785 Hyperlipidemia, unspecified: Secondary | ICD-10-CM

## 2016-09-09 DIAGNOSIS — I480 Paroxysmal atrial fibrillation: Secondary | ICD-10-CM | POA: Diagnosis not present

## 2016-09-09 NOTE — Progress Notes (Signed)
Subjective:  Patient ID: JSAON YOO, male    DOB: 04/21/1945  Age: 71 y.o. MRN: [id number]  CC: Atrial Fibrillation  NEW TO ME  HPI Ronnie Black presents for establishing as a new pt. He feels well and offers no complaints. He has not had palpitations recently.  History Ronnie Black has a past medical history of DDD (degenerative disc disease); HLD (hyperlipidemia); Hypertr obst cardiomyop; and Persistent atrial fibrillation (Ronnie Black).   He has a past surgical history that includes Appendectomy; atrial fibrillation ablation (03/27/10); Wisdom tooth extraction; Skin cancer excision; Cardioversion; TEE without cardioversion (04/26/2011); Cardioversion (04/26/2011); TEE without cardioversion (N/A, 04/28/2012); Cardioversion (N/A, 04/28/2012); Cardioversion (N/A, 07/28/2012); Cardioversion (N/A, 08/14/2012); Cardioversion (N/A, 08/11/2014); and Cardioversion (N/A, 08/26/2014).   His family history includes Cancer in his father and mother; Coronary artery disease in his unknown relative.He reports that he quit smoking about 40 years ago. His smoking use included Cigarettes. He has a 15.00 pack-year smoking history. He has never used smokeless tobacco. He reports that he drinks about 3.6 oz of alcohol per week . He reports that he does not use drugs.  Outpatient Medications Prior to Visit  Medication Sig Dispense Refill  . apixaban (ELIQUIS) 5 MG TABS tablet Take 1 tablet (5 mg total) by mouth 2 (two) times daily. 60 tablet 11  . atorvastatin (LIPITOR) 10 MG tablet Take 1 tablet (10 mg total) by mouth daily. 30 tablet 11  . calcium carbonate (TUMS - DOSED IN MG ELEMENTAL CALCIUM) 500 MG chewable tablet Chew 1 tablet by mouth daily.    . disopyramide (NORPACE CR) 150 MG 12 hr capsule Take 1-2 tablets twice daily (Patient taking differently: Take 2 tablets twice daily) 360 capsule 3  . fish oil-omega-3 fatty acids 1000 MG capsule Take 1 g by mouth 3 (three) times a week.     Marland Kitchen ibuprofen (ADVIL,MOTRIN) 200 MG  tablet Take 200 mg by mouth every 6 (six) hours as needed for moderate pain.     . Loratadine (CLARITIN) 10 MG CAPS Take 10 mg by mouth daily as needed (allergies).     . metoprolol tartrate (LOPRESSOR) 25 MG tablet Take 1 tablet (25 mg total) by mouth 2 (two) times daily. 60 tablet 11  . Multiple Vitamin (MULTIVITAMIN) tablet Take 1 tablet by mouth 3 (three) times a week.     . sildenafil (VIAGRA) 25 MG tablet Take 25 mg by mouth as needed for erectile dysfunction. Take as directed     No facility-administered medications prior to visit.     ROS Review of Systems  Constitutional: Negative for chills, diaphoresis and fatigue.  HENT: Negative.  Negative for trouble swallowing.   Eyes: Negative.   Respiratory: Negative.  Negative for cough, chest tightness, shortness of breath and wheezing.   Cardiovascular: Negative for chest pain, palpitations and leg swelling.  Gastrointestinal: Negative for abdominal pain, constipation, diarrhea, nausea and vomiting.  Endocrine: Negative.   Genitourinary: Negative.  Negative for difficulty urinating.  Musculoskeletal: Negative.   Skin: Negative.   Allergic/Immunologic: Negative.   Neurological: Negative.  Negative for dizziness and light-headedness.  Hematological: Negative for adenopathy. Does not bruise/bleed easily.  Psychiatric/Behavioral: Negative.     Objective:  BP (!) 148/70 (BP Location: Left Arm, Patient Position: Sitting, Cuff Size: Normal)   Pulse 76   Temp 98.6 F (37 C) (Oral)   Resp 16   Ht 5\' 6"  (1.676 m)   Wt 156 lb 4 oz (70.9 kg)   SpO2 98%   BMI  25.22 kg/m   Physical Exam  Constitutional: He is oriented to person, place, and time. No distress.  HENT:  Mouth/Throat: Oropharynx is clear and moist. No oropharyngeal exudate.  Eyes: Conjunctivae are normal. Right eye exhibits no discharge. Left eye exhibits no discharge. No scleral icterus.  Neck: Normal range of motion. Neck supple. No JVD present. No thyromegaly present.    Cardiovascular: Normal rate, regular rhythm and intact distal pulses.  Exam reveals no gallop and no friction rub.   No murmur heard. Pulmonary/Chest: Effort normal and breath sounds normal. No stridor. No respiratory distress. He has no wheezes. He has no rales. He exhibits no tenderness.  Abdominal: Soft. Bowel sounds are normal. He exhibits no distension. There is no tenderness. There is no rebound and no guarding.  Musculoskeletal: Normal range of motion. He exhibits no edema, tenderness or deformity.  Lymphadenopathy:    He has no cervical adenopathy.  Neurological: He is alert and oriented to person, place, and time.  Skin: Skin is warm and dry. No rash noted. He is not diaphoretic. No erythema. No pallor.  Psychiatric: He has a normal mood and affect. His behavior is normal. Judgment and thought content normal.  Vitals reviewed.   Lab Results  Component Value Date   WBC 6.6 01/16/2016   HGB 16.5 01/16/2016   HCT 47.7 01/16/2016   PLT 206 01/16/2016   GLUCOSE 92 01/16/2016   CHOL 167 10/26/2015   TRIG 111 10/26/2015   HDL 47 10/26/2015   LDLCALC 98 10/26/2015   ALT 52 09/05/2010   AST 34 09/05/2010   NA 139 01/16/2016   K 4.2 01/16/2016   CL 104 01/16/2016   CREATININE 1.04 01/16/2016   BUN 13 01/16/2016   CO2 27 01/16/2016   TSH 4.321 08/09/2014   INR 1.3 (H) 02/15/2010    Assessment & Plan:   Ronnie Black was seen today for atrial fibrillation.  Diagnoses and all orders for this visit:  Paroxysmal atrial fibrillation ([place])- he is maintaining sinus rhythm  Hyperlipidemia LDL goal <100- he has achieved his LDL goal and is doing well on the statin   I am having Ronnie Black maintain his fish oil-omega-3 fatty acids, multivitamin, Loratadine, sildenafil, ibuprofen, atorvastatin, metoprolol tartrate, disopyramide, calcium carbonate, and apixaban.  No orders of the defined types were placed in this encounter.    Follow-up: No Follow-up on file.  Ronnie Calico, MD

## 2016-09-10 ENCOUNTER — Encounter: Payer: Self-pay | Admitting: Internal Medicine

## 2016-09-10 NOTE — Patient Instructions (Signed)

## 2016-10-02 ENCOUNTER — Encounter (HOSPITAL_COMMUNITY): Payer: Self-pay | Admitting: Nurse Practitioner

## 2016-10-02 ENCOUNTER — Ambulatory Visit (HOSPITAL_COMMUNITY)
Admission: RE | Admit: 2016-10-02 | Discharge: 2016-10-02 | Disposition: A | Discharge status: Home / Self Care | Payer: BC Managed Care – PPO | Source: Ambulatory Visit | Attending: Nurse Practitioner | Admitting: Nurse Practitioner

## 2016-10-02 VITALS — BP 118/76 | HR 67 | Ht 66.0 in | Wt 156.0 lb

## 2016-10-02 DIAGNOSIS — Z79899 Other long term (current) drug therapy: Secondary | ICD-10-CM | POA: Diagnosis not present

## 2016-10-02 DIAGNOSIS — I484 Atypical atrial flutter: Secondary | ICD-10-CM

## 2016-10-02 DIAGNOSIS — Z87891 Personal history of nicotine dependence: Secondary | ICD-10-CM | POA: Insufficient documentation

## 2016-10-02 DIAGNOSIS — I4892 Unspecified atrial flutter: Secondary | ICD-10-CM | POA: Diagnosis present

## 2016-10-02 DIAGNOSIS — Z9889 Other specified postprocedural states: Secondary | ICD-10-CM | POA: Diagnosis not present

## 2016-10-02 LAB — CBC
HCT: 47 % (ref 39.0–52.0)
Hemoglobin: 15.7 g/dL (ref 13.0–17.0)
MCH: 28.9 pg (ref 26.0–34.0)
MCHC: 33.4 g/dL (ref 30.0–36.0)
MCV: 86.6 fL (ref 78.0–100.0)
Platelets: 215 10*3/uL (ref 150–400)
RBC: 5.43 MIL/uL (ref 4.22–5.81)
RDW: 13 % (ref 11.5–15.5)
WBC: 6.3 10*3/uL (ref 4.0–10.5)

## 2016-10-02 LAB — BASIC METABOLIC PANEL
Anion gap: 7 (ref 5–15)
BUN: 14 mg/dL (ref 6–20)
CO2: 26 mmol/L (ref 22–32)
Calcium: 9.3 mg/dL (ref 8.9–10.3)
Chloride: 103 mmol/L (ref 101–111)
Creatinine, Ser: 0.97 mg/dL (ref 0.61–1.24)
GFR calc Af Amer: >60 mL/min (ref >60)
GFR calc non Af Amer: >60 mL/min (ref >60)
Glucose, Bld: 96 mg/dL (ref 65–99)
Potassium: 4.1 mmol/L (ref 3.5–5.1)
Sodium: 136 mmol/L (ref 135–145)

## 2016-10-02 NOTE — Patient Instructions (Addendum)
   Do not miss any doses of your Eliquis.  Cardioversion scheduled for Thursday, Sept 20th  - Arrive at the [facility] and go to admitting at 1230pm  -Do not eat or drink anything after midnight the night prior to your procedure.  - Take all your medication with a sip of water prior to arrival.  - You will not be able to drive home after your procedure.

## 2016-10-03 NOTE — Progress Notes (Signed)
Patient ID: Ronnie Black, male   DOB: March 27, 1945, 71 y.o.   MRN: [id number]     Primary Care Physician: [name], MD Referring Physician: Dr. Ron Parker EP: Dr. Hilliard Clark is a 71 y.o. male with a h/o afib ablation in 2012, Cardioversion x 5 in the past, 2013 and (2) in 2014, 2 in 2017, 08/11/15 and 01/16/16,  and takes  norpace since 2014 with h/o HCM.Marland Kitchen   He went out of town to [employer] day and went into afib. He feels mildly fatigued and exercise tolerance is not as good but tolerating ok. He is in the office requesting cardioversion. He doubled his Norpace for several days without any improvement. He is in a rate controlled flutter. No missed doses of Eliquis.  Today, he denies symptoms of  chest pain, shortness of breath, orthopnea, PND, lower extremity edema, dizziness, presyncope, syncope, or neurologic sequela. Aware of mild palpitations, fatigue.The patient is tolerating medications without difficulties and is otherwise without complaint today.   Past Medical History:  Diagnosis Date  . DDD (degenerative disc disease)   . HLD (hyperlipidemia)   . 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 Persistent atrial fibrillation ([place])    s/p PVI 3/12   Past Surgical History:  Procedure Laterality Date  . APPENDECTOMY    . atrial fibrillation ablation  03/27/10   PVI with CTI ablation by JA  . CARDIOVERSION    . CARDIOVERSION  04/26/2011   Procedure: CARDIOVERSION;  Surgeon: Larey Dresser, MD;  Location: [place];  Service: Cardiovascular;  Laterality: N/A;  . CARDIOVERSION N/A 04/28/2012   Procedure: CARDIOVERSION;  Surgeon: Thayer Headings, MD;  Location: [place];  Service: Cardiovascular;  Laterality: N/A;  . CARDIOVERSION N/A 07/28/2012   Procedure: CARDIOVERSION;  Surgeon: Lelon Perla, MD;  Location: [facility] ENDOSCOPY;  Service: Cardiovascular;  Laterality: N/A;  . CARDIOVERSION N/A 08/14/2012   Procedure: CARDIOVERSION;  Surgeon:  Josue Hector, MD;  Location: [facility] ENDOSCOPY;  Service: Cardiovascular;  Laterality: N/A;  . CARDIOVERSION N/A 08/11/2014   Procedure: CARDIOVERSION;  Surgeon: Fay Records, MD;  Location: [place];  Service: Cardiovascular;  Laterality: N/A;  . CARDIOVERSION N/A 08/26/2014   Procedure: CARDIOVERSION;  Surgeon: Sanda Klein, MD;  Location: [facility] ENDOSCOPY;  Service: Cardiovascular;  Laterality: N/A;  . SKIN CANCER EXCISION    . TEE WITHOUT CARDIOVERSION  04/26/2011   Procedure: TRANSESOPHAGEAL ECHOCARDIOGRAM (TEE);  Surgeon: Larey Dresser, MD;  Location: [facility] ENDOSCOPY;  Service: Cardiovascular;  Laterality: N/A;  to be done at 1330  . TEE WITHOUT CARDIOVERSION N/A 04/28/2012   Procedure: TRANSESOPHAGEAL ECHOCARDIOGRAM (TEE);  Surgeon: Thayer Headings, MD;  Location: [place];  Service: Cardiovascular;  Laterality: N/A;  . WISDOM TOOTH EXTRACTION      Current Outpatient Prescriptions  Medication Sig Dispense Refill  . apixaban (ELIQUIS) 5 MG TABS tablet Take 1 tablet (5 mg total) by mouth 2 (two) times daily. 60 tablet 11  . atorvastatin (LIPITOR) 10 MG tablet Take 1 tablet (10 mg total) by mouth daily. 30 tablet 11  . calcium carbonate (TUMS - DOSED IN MG ELEMENTAL CALCIUM) 500 MG chewable tablet Chew 1 tablet by mouth daily.    . disopyramide (NORPACE CR) 150 MG 12 hr capsule Take 1-2 tablets twice daily (Patient taking differently: Take 2 tablets twice daily) 360 capsule 3  . fish oil-omega-3 fatty acids 1000 MG capsule Take 1 g by mouth 3 (three) times  a week.     . metoprolol tartrate (LOPRESSOR) 25 MG tablet Take 1 tablet (25 mg total) by mouth 2 (two) times daily. 60 tablet 11  . Multiple Vitamin (MULTIVITAMIN) tablet Take 1 tablet by mouth 3 (three) times a week.     . sildenafil (VIAGRA) 25 MG tablet Take 25 mg by mouth as needed for erectile dysfunction. Take as directed    . ibuprofen (ADVIL,MOTRIN) 200 MG tablet Take 200 mg by mouth every 6 (six) hours as needed for moderate pain.      . Loratadine (CLARITIN) 10 MG CAPS Take 10 mg by mouth daily as needed (allergies).      No current facility-administered medications for this encounter.     No Known Allergies  Social History   Social History  . Marital status: Married    Spouse name: N/A  . Number of children: N/A  . Years of education: N/A   Occupational History  . professor [employer]   Social History Main Topics  . Smoking status: Former Smoker    Packs/day: 1.00    Years: 15.00    Types: Cigarettes    Quit date: 11/28/1975  . Smokeless tobacco: Never Used  . Alcohol use 3.6 oz/week    3 Glasses of wine, 3 Cans of beer per week     Comment: rare wine  . Drug use: No  . Sexual activity: Yes   Other Topics Concern  . Not on file   Social History Narrative    a professor of Therapist, occupational at [employer] in [place].    Family History  Problem Relation Age of Onset  . Coronary artery disease Unknown   . Cancer Mother   . Cancer Father   . Early death Neg Hx   . Hearing loss Neg Hx   . Heart disease Neg Hx   . Hyperlipidemia Neg Hx   . Hypertension Neg Hx   . Kidney disease Neg Hx   . Stroke Neg Hx     ROS- All systems are reviewed and negative except as per the HPI above  Physical Exam: Vitals:   10/02/16 1525  BP: 118/76  Pulse: 67  Weight: 156 lb (70.8 kg)  Height: 5\' 6"  (1.676 m)    GEN- The patient is well appearing, alert and oriented x 3 today.   Head- normocephalic, atraumatic Eyes-  Sclera clear, conjunctiva pink Ears- hearing intact Oropharynx- clear Neck- supple, no JVP Lymph- no cervical lymphadenopathy Lungs- Clear to ausculation bilaterally, normal work of breathing Heart- irregular rate and rhythm, no murmurs, rubs or gallops, PMI not laterally displaced GI- soft, NT, ND, + BS Extremities- no clubbing, cyanosis, or edema MS- no significant deformity or atrophy Skin- no rash or lesion Psych- euthymic mood, full affect Neuro- strength and sensation are  intact  EKG- atrial flutter with variable rate at 67 bpm, QRS int 100 ms, qtc 443 ms  Echo- Left ventricle: The cavity size was normal. Wall thickness was increased in a pattern of moderate LVH. Systolic function was normal. The estimated ejection fraction was in the range of 55% to 60%. Wall motion was normal; there were no regional wall motion abnormalities. - Mitral valve: There was mild regurgitation. - Right atrium: The atrium was mildly to moderately dilated.   Assessment and Plan:  1. Persistent  atrial flutter since Labor Day Will plan for cardioversion 9/20 No missed doses of Eliquis 5 mg bid Continue norpace 150 mg bid, recent increase to  300 mg bid did not restore SR He is interested to discuss with Dr. [name] repeat ablation  F/u afib clinic in one week, Dr. [name] in 4-6 weeks  Geroge Baseman. Carly Applegate, [facility] [address] [place], [place] [address] [phone]

## 2016-10-03 NOTE — Addendum Note (Signed)
Encounter addended by: Sherran Needs, NP on: 10/03/2016  8:45 AM<BR>    Actions taken: LOS modified

## 2016-10-10 ENCOUNTER — Ambulatory Visit (HOSPITAL_COMMUNITY): Payer: BC Managed Care – PPO | Admitting: Certified Registered Nurse Anesthetist

## 2016-10-10 ENCOUNTER — Encounter (HOSPITAL_COMMUNITY): Payer: Self-pay | Admitting: *Deleted

## 2016-10-10 ENCOUNTER — Ambulatory Visit (HOSPITAL_COMMUNITY)
Admission: RE | Admit: 2016-10-10 | Discharge: 2016-10-10 | Disposition: A | Discharge status: Home / Self Care | Payer: BC Managed Care – PPO | Source: Ambulatory Visit | Attending: Cardiology | Admitting: Cardiology

## 2016-10-10 ENCOUNTER — Encounter (HOSPITAL_COMMUNITY)
Admission: RE | Disposition: A | Discharge status: Home / Self Care | Payer: Self-pay | Source: Ambulatory Visit | Attending: Cardiology

## 2016-10-10 DIAGNOSIS — Z87891 Personal history of nicotine dependence: Secondary | ICD-10-CM | POA: Diagnosis not present

## 2016-10-10 DIAGNOSIS — Z7901 Long term (current) use of anticoagulants: Secondary | ICD-10-CM | POA: Diagnosis not present

## 2016-10-10 DIAGNOSIS — Z79899 Other long term (current) drug therapy: Secondary | ICD-10-CM | POA: Insufficient documentation

## 2016-10-10 DIAGNOSIS — M1990 Unspecified osteoarthritis, unspecified site: Secondary | ICD-10-CM | POA: Diagnosis not present

## 2016-10-10 DIAGNOSIS — E785 Hyperlipidemia, unspecified: Secondary | ICD-10-CM | POA: Diagnosis not present

## 2016-10-10 DIAGNOSIS — I4892 Unspecified atrial flutter: Secondary | ICD-10-CM | POA: Insufficient documentation

## 2016-10-10 DIAGNOSIS — Z791 Long term (current) use of non-steroidal anti-inflammatories (NSAID): Secondary | ICD-10-CM | POA: Insufficient documentation

## 2016-10-10 HISTORY — PX: CARDIOVERSION: SHX1299

## 2016-10-10 SURGERY — CARDIOVERSION
Anesthesia: Monitor Anesthesia Care

## 2016-10-10 MED ORDER — SODIUM CHLORIDE 0.9 % IV SOLN
INTRAVENOUS | Status: DC
  Administered 2016-10-10: 14:00:00 via INTRAVENOUS

## 2016-10-10 NOTE — Anesthesia Preprocedure Evaluation (Signed)
Anesthesia Evaluation  Patient identified by MRN, date of birth, ID band Patient awake    Reviewed: Allergy & Precautions, NPO status , Patient's Chart, lab work & pertinent test results, reviewed documented beta blocker date and time   Airway Mallampati: II       Dental  (+) Teeth Intact   Pulmonary former smoker,    breath sounds clear to auscultation       Cardiovascular Exercise Tolerance: Good + dysrhythmias Atrial Fibrillation  Rhythm:Irregular Rate:Abnormal  ECG: A-flutter, rate 67  LV EF: 55% -   60%  Hypertr obst cardiomyop   Neuro/Psych negative neurological ROS     GI/Hepatic negative GI ROS, Neg liver ROS,   Endo/Other  negative endocrine ROS  Renal/GU negative Renal ROS     Musculoskeletal  (+) Arthritis ,   Abdominal   Peds  Hematology negative hematology ROS (+)   Anesthesia Other Findings HLD (hyperlipidemia)  Reproductive/Obstetrics                             Lab Results  Component Value Date   WBC 6.3 10/02/2016   HGB 15.7 10/02/2016   HCT 47.0 10/02/2016   MCV 86.6 10/02/2016   PLT 215 10/02/2016   Lab Results  Component Value Date   CREATININE 0.97 10/02/2016   BUN 14 10/02/2016   NA 136 10/02/2016   K 4.1 10/02/2016   CL 103 10/02/2016   CO2 26 10/02/2016   EKG: atrial fibrillation, rate 77.   Anesthesia Physical  Anesthesia Plan  ASA: III  Anesthesia Plan: General   Post-op Pain Management:    Induction: Intravenous  PONV Risk Score and Plan: 2 and Propofol infusion and Treatment may vary due to age or medical condition  Airway Management Planned: Mask  Additional Equipment:   Intra-op Plan:   Post-operative Plan:   Informed Consent: I have reviewed the patients History and Physical, chart, labs and discussed the procedure including the risks, benefits and alternatives for the proposed anesthesia with the patient or authorized  representative who has indicated his/her understanding and acceptance.   Dental advisory given  Plan Discussed with: CRNA  Anesthesia Plan Comments:         Anesthesia Quick Evaluation

## 2016-10-10 NOTE — Anesthesia Postprocedure Evaluation (Signed)
Anesthesia Post Note  Patient: Ronnie Black  Procedure(s) Performed: Procedure(s) (LRB): CARDIOVERSION (N/A)     Anesthesia Post Evaluation  Last Vitals:  Vitals:   10/10/16 1445 10/10/16 1500  BP: 124/73   Pulse: 67   Resp: 19 (!) 8  Temp:    SpO2: 100%     Last Pain:  Vitals:   10/10/16 1429  TempSrc: Oral                 Lynda Rainwater

## 2016-10-10 NOTE — Transfer of Care (Signed)
Immediate Anesthesia Transfer of Care Note  Patient: Ronnie Black  Procedure(s) Performed: Procedure(s): CARDIOVERSION (N/A)  Patient Location: Endoscopy Unit  Anesthesia Type:MAC  Level of Consciousness: awake, oriented, drowsy and patient cooperative  Airway & Oxygen Therapy: Patient Spontanous Breathing  Post-op Assessment: Report given to RN and Post -op Vital signs reviewed and stable  Post vital signs: Reviewed and stable  Last Vitals:  Vitals:   10/10/16 1424 10/10/16 1425  BP: 125/88   Pulse: 63 65  Resp: 11 17  Temp:    SpO2: 100% 100%    Last Pain:  Vitals:   10/10/16 1340  TempSrc: Oral         Complications: No apparent anesthesia complications

## 2016-10-10 NOTE — Discharge Instructions (Signed)
Electrical Cardioversion, Care After °This sheet gives you information about how to care for yourself after your procedure. Your health care provider may also give you more specific instructions. If you have problems or questions, contact your health care provider. °What can I expect after the procedure? °After the procedure, it is common to have: °· Some redness on the skin where the shocks were given. ° °Follow these instructions at home: °· Do not drive for 24 hours if you were given a medicine to help you relax (sedative). °· Take over-the-counter and prescription medicines only as told by your health care provider. °· Ask your health care provider how to check your pulse. Check it often. °· Rest for 48 hours after the procedure or as told by your health care provider. °· Avoid or limit your caffeine use as told by your health care provider. °Contact a health care provider if: °· You feel like your heart is beating too quickly or your pulse is not regular. °· You have a serious muscle cramp that does not go away. °Get help right away if: °· You have discomfort in your chest. °· You are dizzy or you feel faint. °· You have trouble breathing or you are short of breath. °· Your speech is slurred. °· You have trouble moving an arm or leg on one side of your body. °· Your fingers or toes turn cold or blue. °This information is not intended to replace advice given to you by your health care provider. Make sure you discuss any questions you have with your health care provider. °Document Released: 10/28/2012 Document Revised: 08/11/2015 Document Reviewed: 07/14/2015 °Elsevier Interactive Patient Education © 2018 Elsevier Inc[employer] ° °

## 2016-10-10 NOTE — Procedures (Signed)
Electrical Cardioversion Procedure Note Ronnie Black [id number] 05/29/1945  Procedure: Electrical Cardioversion Indications:  Atrial Flutter  Procedure Details Consent: Risks of procedure as well as the alternatives and risks of each were explained to the (patient/caregiver).  Consent for procedure obtained. Time Out: Verified patient identification, verified procedure, site/side was marked, verified correct patient position, special equipment/implants available, medications/allergies/relevent history reviewed, required imaging and test results available.  Performed  Patient placed on cardiac monitor, pulse oximetry, supplemental oxygen as necessary.  Sedation given: Propofol Pacer pads placed anterior and posterior chest.  Cardioverted 1 time(s).  Cardioverted at [place].  Evaluation Findings: Post procedure EKG shows: NSR Complications: None Patient did tolerate procedure well.   Loralie Champagne 10/10/2016, 2:26 PM

## 2016-10-10 NOTE — Interval H&P Note (Signed)
History and Physical Interval Note:  10/10/2016 2:18 PM  Ronnie Black  has presented today for surgery, with the diagnosis of AFIB  The various methods of treatment have been discussed with the patient and family. After consideration of risks, benefits and other options for treatment, the patient has consented to  Procedure(s): CARDIOVERSION (N/A) as a surgical intervention .  The patient's history has been reviewed, patient examined, no change in status, stable for surgery.  I have reviewed the patient's chart and labs.  Questions were answered to the patient's satisfaction.     Hooria Gasparini [employer]

## 2016-10-10 NOTE — H&P (View-Only) (Signed)
Patient ID: Ronnie Black, male   DOB: 07/14/1945, 71 y.o.   MRN: [id number]     Primary Care Physician: Ronnie Arnt, MD Referring Physician: [name] Black: Dr. [name] is a 71 y.o. male with a h/o afib ablation in 2012, Cardioversion x 5 in the past, 2013 and (2) in 2014, 2 in 2017, 08/11/15 and 01/16/16,  and takes  norpace since 2014 with h/o HCM.Marland Kitchen   He went out of town to [employer] day and went into afib. He feels mildly fatigued and exercise tolerance is not as good but tolerating ok. He is in the office requesting cardioversion. He doubled his Norpace for several days without any improvement. He is in a rate controlled flutter. No missed doses of Eliquis.  Today, he denies symptoms of  chest pain, shortness of breath, orthopnea, PND, lower extremity edema, dizziness, presyncope, syncope, or neurologic sequela. Aware of mild palpitations, fatigue.The patient is tolerating medications without difficulties and is otherwise without complaint today.   Past Medical History:  Diagnosis Date  . DDD (degenerative disc disease)   . HLD (hyperlipidemia)   . 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 Persistent atrial fibrillation ([place])    s/p PVI 3/12   Past Surgical History:  Procedure Laterality Date  . APPENDECTOMY    . atrial fibrillation ablation  03/27/10   PVI with CTI ablation by JA  . CARDIOVERSION    . CARDIOVERSION  04/26/2011   Procedure: CARDIOVERSION;  Surgeon: Larey Dresser, MD;  Location: [place];  Service: Cardiovascular;  Laterality: N/A;  . CARDIOVERSION N/A 04/28/2012   Procedure: CARDIOVERSION;  Surgeon: Thayer Headings, MD;  Location: [place];  Service: Cardiovascular;  Laterality: N/A;  . CARDIOVERSION N/A 07/28/2012   Procedure: CARDIOVERSION;  Surgeon: Lelon Perla, MD;  Location: [facility] ENDOSCOPY;  Service: Cardiovascular;  Laterality: N/A;  . CARDIOVERSION N/A 08/14/2012   Procedure: CARDIOVERSION;  Surgeon:  Josue Hector, MD;  Location: [facility] ENDOSCOPY;  Service: Cardiovascular;  Laterality: N/A;  . CARDIOVERSION N/A 08/11/2014   Procedure: CARDIOVERSION;  Surgeon: Fay Records, MD;  Location: [place];  Service: Cardiovascular;  Laterality: N/A;  . CARDIOVERSION N/A 08/26/2014   Procedure: CARDIOVERSION;  Surgeon: Sanda Klein, MD;  Location: [facility] ENDOSCOPY;  Service: Cardiovascular;  Laterality: N/A;  . SKIN CANCER EXCISION    . TEE WITHOUT CARDIOVERSION  04/26/2011   Procedure: TRANSESOPHAGEAL ECHOCARDIOGRAM (TEE);  Surgeon: Larey Dresser, MD;  Location: [facility] ENDOSCOPY;  Service: Cardiovascular;  Laterality: N/A;  to be done at 1330  . TEE WITHOUT CARDIOVERSION N/A 04/28/2012   Procedure: TRANSESOPHAGEAL ECHOCARDIOGRAM (TEE);  Surgeon: Thayer Headings, MD;  Location: [place];  Service: Cardiovascular;  Laterality: N/A;  . WISDOM TOOTH EXTRACTION      Current Outpatient Prescriptions  Medication Sig Dispense Refill  . apixaban (ELIQUIS) 5 MG TABS tablet Take 1 tablet (5 mg total) by mouth 2 (two) times daily. 60 tablet 11  . atorvastatin (LIPITOR) 10 MG tablet Take 1 tablet (10 mg total) by mouth daily. 30 tablet 11  . calcium carbonate (TUMS - DOSED IN MG ELEMENTAL CALCIUM) 500 MG chewable tablet Chew 1 tablet by mouth daily.    . disopyramide (NORPACE CR) 150 MG 12 hr capsule Take 1-2 tablets twice daily (Patient taking differently: Take 2 tablets twice daily) 360 capsule 3  . fish oil-omega-3 fatty acids 1000 MG capsule Take 1 g by mouth 3 (three) times  a week.     . metoprolol tartrate (LOPRESSOR) 25 MG tablet Take 1 tablet (25 mg total) by mouth 2 (two) times daily. 60 tablet 11  . Multiple Vitamin (MULTIVITAMIN) tablet Take 1 tablet by mouth 3 (three) times a week.     . sildenafil (VIAGRA) 25 MG tablet Take 25 mg by mouth as needed for erectile dysfunction. Take as directed    . ibuprofen (ADVIL,MOTRIN) 200 MG tablet Take 200 mg by mouth every 6 (six) hours as needed for moderate pain.      . Loratadine (CLARITIN) 10 MG CAPS Take 10 mg by mouth daily as needed (allergies).      No current facility-administered medications for this encounter.     No Known Allergies  Social History   Social History  . Marital status: Married    Spouse name: N/A  . Number of children: N/A  . Years of education: N/A   Occupational History  . professor [employer]   Social History Main Topics  . Smoking status: Former Smoker    Packs/day: 1.00    Years: 15.00    Types: Cigarettes    Quit date: 11/28/1975  . Smokeless tobacco: Never Used  . Alcohol use 3.6 oz/week    3 Glasses of wine, 3 Cans of beer per week     Comment: rare wine  . Drug use: No  . Sexual activity: Yes   Other Topics Concern  . Not on file   Social History Narrative    a professor of Therapist, occupational at [employer] in [place].    Family History  Problem Relation Age of Onset  . Coronary artery disease Unknown   . Cancer Mother   . Cancer Father   . Early death Neg Hx   . Hearing loss Neg Hx   . Heart disease Neg Hx   . Hyperlipidemia Neg Hx   . Hypertension Neg Hx   . Kidney disease Neg Hx   . Stroke Neg Hx     ROS- All systems are reviewed and negative except as per the HPI above  Physical Exam: Vitals:   10/02/16 1525  BP: 118/76  Pulse: 67  Weight: 156 lb (70.8 kg)  Height: 5\' 6"  (1.676 m)    GEN- The patient is well appearing, alert and oriented x 3 today.   Head- normocephalic, atraumatic Eyes-  Sclera clear, conjunctiva pink Ears- hearing intact Oropharynx- clear Neck- supple, no JVP Lymph- no cervical lymphadenopathy Lungs- Clear to ausculation bilaterally, normal work of breathing Heart- irregular rate and rhythm, no murmurs, rubs or gallops, PMI not laterally displaced GI- soft, NT, ND, + BS Extremities- no clubbing, cyanosis, or edema MS- no significant deformity or atrophy Skin- no rash or lesion Psych- euthymic mood, full affect Neuro- strength and sensation are  intact  EKG- atrial flutter with variable rate at 67 bpm, QRS int 100 ms, qtc 443 ms  Echo- Left ventricle: The cavity size was normal. Wall thickness was increased in a pattern of moderate LVH. Systolic function was normal. The estimated ejection fraction was in the range of 55% to 60%. Wall motion was normal; there were no regional wall motion abnormalities. - Mitral valve: There was mild regurgitation. - Right atrium: The atrium was mildly to moderately dilated.   Assessment and Plan:  1. Persistent  atrial flutter since Labor Day Will plan for cardioversion 9/20 No missed doses of Eliquis 5 mg bid Continue norpace 150 mg bid, recent increase to  300 mg bid did not restore SR He is interested to discuss with Dr. [name] repeat ablation  F/u afib clinic in one week, Dr. [name] in 4-6 weeks  Geroge Baseman. Jaasia Viglione, [facility] [address] [place], [place] [address] [phone]

## 2016-10-16 ENCOUNTER — Ambulatory Visit (HOSPITAL_COMMUNITY)
Admission: RE | Admit: 2016-10-16 | Discharge: 2016-10-16 | Disposition: A | Discharge status: Home / Self Care | Payer: BC Managed Care – PPO | Source: Ambulatory Visit | Attending: Nurse Practitioner | Admitting: Nurse Practitioner

## 2016-10-16 ENCOUNTER — Encounter (HOSPITAL_COMMUNITY): Payer: Self-pay | Admitting: Nurse Practitioner

## 2016-10-16 VITALS — BP 126/74 | HR 73 | Ht 66.0 in | Wt 154.0 lb

## 2016-10-16 DIAGNOSIS — Z7901 Long term (current) use of anticoagulants: Secondary | ICD-10-CM | POA: Diagnosis not present

## 2016-10-16 DIAGNOSIS — I481 Persistent atrial fibrillation: Secondary | ICD-10-CM | POA: Diagnosis not present

## 2016-10-16 DIAGNOSIS — Z85828 Personal history of other malignant neoplasm of skin: Secondary | ICD-10-CM | POA: Insufficient documentation

## 2016-10-16 DIAGNOSIS — Z87891 Personal history of nicotine dependence: Secondary | ICD-10-CM | POA: Insufficient documentation

## 2016-10-16 DIAGNOSIS — Z79899 Other long term (current) drug therapy: Secondary | ICD-10-CM | POA: Diagnosis not present

## 2016-10-16 DIAGNOSIS — I4892 Unspecified atrial flutter: Secondary | ICD-10-CM | POA: Diagnosis not present

## 2016-10-16 DIAGNOSIS — E785 Hyperlipidemia, unspecified: Secondary | ICD-10-CM | POA: Diagnosis not present

## 2016-10-16 DIAGNOSIS — I4819 Other persistent atrial fibrillation: Secondary | ICD-10-CM

## 2016-10-16 NOTE — Progress Notes (Signed)
Patient ID: Ronnie Black, male   DOB: 21-Apr-1945, 71 y.o.   MRN: [id number]     Primary Care Physician: [name], MD Referring Physician: Dr. Ron Parker EP: Dr. Hilliard Clark is a 71 y.o. male with a h/o afib ablation in 2012, cardioversion x 8 in the past, most recent 01/16/16 and   10/10/16, that was initially successful but went back into afib after a few days.  Takes norpace since 2014 with h/o HCM.Usually takes 150 mg q 12 hours, but will increase to 300 mg bid when in afib but has not been successful to get pt back in rhythm.  He is now ready to discuss with Dr. [name] other options, he believes he would prefer another ablation over change in antiarrythmic.  Today, he denies symptoms of  chest pain, shortness of breath, orthopnea, PND, lower extremity edema, dizziness, presyncope, syncope, or neurologic sequela. Aware of mild palpitations, fatigue.The patient is tolerating medications without difficulties and is otherwise without complaint today.   Past Medical History:  Diagnosis Date  . DDD (degenerative disc disease)   . HLD (hyperlipidemia)   . 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 Persistent atrial fibrillation ([place])    s/p PVI 3/12   Past Surgical History:  Procedure Laterality Date  . APPENDECTOMY    . atrial fibrillation ablation  03/27/10   PVI with CTI ablation by JA  . CARDIOVERSION    . CARDIOVERSION  04/26/2011   Procedure: CARDIOVERSION;  Surgeon: Larey Dresser, MD;  Location: [place];  Service: Cardiovascular;  Laterality: N/A;  . CARDIOVERSION N/A 04/28/2012   Procedure: CARDIOVERSION;  Surgeon: Thayer Headings, MD;  Location: [place];  Service: Cardiovascular;  Laterality: N/A;  . CARDIOVERSION N/A 07/28/2012   Procedure: CARDIOVERSION;  Surgeon: Lelon Perla, MD;  Location: [facility] ENDOSCOPY;  Service: Cardiovascular;  Laterality: N/A;  . CARDIOVERSION N/A 08/14/2012   Procedure: CARDIOVERSION;  Surgeon: Josue Hector, MD;  Location: [facility] ENDOSCOPY;  Service: Cardiovascular;  Laterality: N/A;  . CARDIOVERSION N/A 08/11/2014   Procedure: CARDIOVERSION;  Surgeon: Fay Records, MD;  Location: [facility] ENDOSCOPY;  Service: Cardiovascular;  Laterality: N/A;  . CARDIOVERSION N/A 08/26/2014   Procedure: CARDIOVERSION;  Surgeon: Sanda Klein, MD;  Location: [facility] ENDOSCOPY;  Service: Cardiovascular;  Laterality: N/A;  . CARDIOVERSION N/A 10/10/2016   Procedure: CARDIOVERSION;  Surgeon: Larey Dresser, MD;  Location: [facility] ENDOSCOPY;  Service: Cardiovascular;  Laterality: N/A;  . SKIN CANCER EXCISION    . TEE WITHOUT CARDIOVERSION  04/26/2011   Procedure: TRANSESOPHAGEAL ECHOCARDIOGRAM (TEE);  Surgeon: Larey Dresser, MD;  Location: [facility] ENDOSCOPY;  Service: Cardiovascular;  Laterality: N/A;  to be done at 1330  . TEE WITHOUT CARDIOVERSION N/A 04/28/2012   Procedure: TRANSESOPHAGEAL ECHOCARDIOGRAM (TEE);  Surgeon: Thayer Headings, MD;  Location: [place];  Service: Cardiovascular;  Laterality: N/A;  . WISDOM TOOTH EXTRACTION      Current Outpatient Prescriptions  Medication Sig Dispense Refill  . apixaban (ELIQUIS) 5 MG TABS tablet Take 1 tablet (5 mg total) by mouth 2 (two) times daily. 60 tablet 11  . atorvastatin (LIPITOR) 10 MG tablet Take 1 tablet (10 mg total) by mouth daily. 30 tablet 11  . calcium carbonate (TUMS - DOSED IN MG ELEMENTAL CALCIUM) 500 MG chewable tablet Chew 1 tablet by mouth daily.    . disopyramide (NORPACE CR) 150 MG 12 hr capsule Take 1-2 tablets twice daily (Patient taking differently: Take  2 tablets twice daily) 360 capsule 3  . fish oil-omega-3 fatty acids 1000 MG capsule Take 1 g by mouth 3 (three) times a week.     Marland Kitchen ibuprofen (ADVIL,MOTRIN) 200 MG tablet Take 200 mg by mouth every 6 (six) hours as needed for moderate pain.     . Loratadine (CLARITIN) 10 MG CAPS Take 10 mg by mouth daily as needed (allergies).     . metoprolol tartrate (LOPRESSOR) 25 MG tablet Take 1 tablet (25 mg total) by  mouth 2 (two) times daily. 60 tablet 11  . Multiple Vitamin (MULTIVITAMIN) tablet Take 1 tablet by mouth 3 (three) times a week.     . sildenafil (VIAGRA) 25 MG tablet Take 25 mg by mouth as needed for erectile dysfunction. Take as directed     No current facility-administered medications for this encounter.     No Known Allergies  Social History   Social History  . Marital status: Married    Spouse name: N/A  . Number of children: N/A  . Years of education: N/A   Occupational History  . professor [employer]   Social History Main Topics  . Smoking status: Former Smoker    Packs/day: 1.00    Years: 15.00    Types: Cigarettes    Quit date: 11/28/1975  . Smokeless tobacco: Never Used  . Alcohol use 3.6 oz/week    3 Glasses of wine, 3 Cans of beer per week     Comment: rare wine  . Drug use: No  . Sexual activity: Yes   Other Topics Concern  . Not on file   Social History Narrative    a professor of Therapist, occupational at [employer] in [place].    Family History  Problem Relation Age of Onset  . Coronary artery disease Unknown   . Cancer Mother   . Cancer Father   . Early death Neg Hx   . Hearing loss Neg Hx   . Heart disease Neg Hx   . Hyperlipidemia Neg Hx   . Hypertension Neg Hx   . Kidney disease Neg Hx   . Stroke Neg Hx     ROS- All systems are reviewed and negative except as per the HPI above  Physical Exam: Vitals:   10/16/16 1537  BP: 126/74  Pulse: 73  Weight: 154 lb (69.9 kg)  Height: 5\' 6"  (1.676 m)    GEN- The patient is well appearing, alert and oriented x 3 today.   Head- normocephalic, atraumatic Eyes-  Sclera clear, conjunctiva pink Ears- hearing intact Oropharynx- clear Neck- supple, no JVP Lymph- no cervical lymphadenopathy Lungs- Clear to ausculation bilaterally, normal work of breathing Heart- irregular rate and rhythm, no murmurs, rubs or gallops, PMI not laterally displaced GI- soft, NT, ND, + BS Extremities- no clubbing,  cyanosis, or edema MS- no significant deformity or atrophy Skin- no rash or lesion Psych- euthymic mood, full affect Neuro- strength and sensation are intact  EKG- atrial fib with variable rate at 73 bpm, QRS int 94 ms, qtc 423 ms  Echo-2016- Left ventricle: The cavity size was normal. Wall thickness was increased in a pattern of moderate LVH. Systolic function was normal. The estimated ejection fraction was in the range of 55% to 60%. Wall motion was normal; there were no regional wall motion abnormalities. - Mitral valve: There was mild regurgitation. - Right atrium: The atrium was mildly to moderately dilated.   Assessment and Plan:  1. Persistent  Afib/flutter  Cardioversion 9/20 which was successful but ERAF Continue Eliquis 5 mg bid with chadsvasc score of 2 Continue norpace 150 mg bid, recent increase to 300 mg bid did not restore SR He is interested to discuss with Dr. [name] repeat ablation Update echo  Dr. [name] in 2-4 weeks  Geroge Baseman. Corlene Sabia, [facility] [address] [place], [place] [address] [phone]

## 2016-10-22 ENCOUNTER — Ambulatory Visit (HOSPITAL_COMMUNITY)
Admission: RE | Admit: 2016-10-22 | Discharge: 2016-10-22 | Disposition: A | Discharge status: Home / Self Care | Payer: BC Managed Care – PPO | Source: Ambulatory Visit | Attending: Nurse Practitioner | Admitting: Nurse Practitioner

## 2016-10-22 DIAGNOSIS — I481 Persistent atrial fibrillation: Secondary | ICD-10-CM | POA: Diagnosis not present

## 2016-10-22 DIAGNOSIS — I4892 Unspecified atrial flutter: Secondary | ICD-10-CM | POA: Diagnosis not present

## 2016-10-22 DIAGNOSIS — I340 Nonrheumatic mitral (valve) insufficiency: Secondary | ICD-10-CM | POA: Insufficient documentation

## 2016-10-22 DIAGNOSIS — I4891 Unspecified atrial fibrillation: Secondary | ICD-10-CM | POA: Insufficient documentation

## 2016-10-22 DIAGNOSIS — I4819 Other persistent atrial fibrillation: Secondary | ICD-10-CM

## 2016-10-22 NOTE — Progress Notes (Signed)
  Echocardiogram 2D Echocardiogram has been performed.  Tresa Res 10/22/2016, 1:41 PM

## 2016-10-23 ENCOUNTER — Encounter: Payer: Self-pay | Admitting: Internal Medicine

## 2016-10-23 ENCOUNTER — Other Ambulatory Visit (HOSPITAL_COMMUNITY): Payer: BC Managed Care – PPO

## 2016-10-23 ENCOUNTER — Ambulatory Visit (INDEPENDENT_AMBULATORY_CARE_PROVIDER_SITE_OTHER): Payer: BC Managed Care – PPO | Admitting: Internal Medicine

## 2016-10-23 ENCOUNTER — Telehealth: Payer: Self-pay | Admitting: Internal Medicine

## 2016-10-23 VITALS — BP 130/88 | HR 72 | Ht 66.0 in | Wt 156.0 lb

## 2016-10-23 DIAGNOSIS — I481 Persistent atrial fibrillation: Secondary | ICD-10-CM | POA: Diagnosis not present

## 2016-10-23 DIAGNOSIS — I4819 Other persistent atrial fibrillation: Secondary | ICD-10-CM

## 2016-10-23 DIAGNOSIS — I421 Obstructive hypertrophic cardiomyopathy: Secondary | ICD-10-CM | POA: Diagnosis not present

## 2016-10-23 NOTE — Telephone Encounter (Signed)
New message     Patient states he can do ablation on 11/1. Patient states he is having labs on 10/18 with  PCP.

## 2016-10-23 NOTE — Progress Notes (Signed)
Electrophysiology Office Note   Date:  10/23/2016   ID:  Ronnie Black, DOB April 07, 1945, MRN [id number]  PCP:  [name], MD    Primary Electrophysiologist: [name], MD    Chief Complaint  Patient presents with  . Atrial Fibrillation     History of Present Illness: Ronnie Black is a 71 y.o. male who presents today for electrophysiology evaluation.   He has recently returned to afib.  He has symptoms of palpitations and fatigue.  He is notices this mostly at home when exercising.  He presented and underwent cardioversion.  He felt better initially but unfortunately returned to afib in about 4 days.  He has been in afib since that time.  Today, he denies symptoms of palpitations, chest pain, shortness of breath, orthopnea, PND, lower extremity edema, claudication, dizziness, presyncope, syncope, bleeding, or neurologic sequela. The patient is tolerating medications without difficulties and is otherwise without complaint today.    Past Medical History:  Diagnosis Date  . DDD (degenerative disc disease)   . HLD (hyperlipidemia)   . 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 Persistent atrial fibrillation ([place])    s/p PVI 3/12   Past Surgical History:  Procedure Laterality Date  . APPENDECTOMY    . atrial fibrillation ablation  03/27/10   PVI with CTI ablation by JA  . CARDIOVERSION    . CARDIOVERSION  04/26/2011   Procedure: CARDIOVERSION;  Surgeon: Larey Dresser, MD;  Location: [place];  Service: Cardiovascular;  Laterality: N/A;  . CARDIOVERSION N/A 04/28/2012   Procedure: CARDIOVERSION;  Surgeon: Thayer Headings, MD;  Location: [place];  Service: Cardiovascular;  Laterality: N/A;  . CARDIOVERSION N/A 07/28/2012   Procedure: CARDIOVERSION;  Surgeon: Lelon Perla, MD;  Location: [facility] ENDOSCOPY;  Service: Cardiovascular;  Laterality: N/A;  . CARDIOVERSION N/A 08/14/2012   Procedure: CARDIOVERSION;  Surgeon: Josue Hector, MD;   Location: [facility] ENDOSCOPY;  Service: Cardiovascular;  Laterality: N/A;  . CARDIOVERSION N/A 08/11/2014   Procedure: CARDIOVERSION;  Surgeon: Fay Records, MD;  Location: [facility] ENDOSCOPY;  Service: Cardiovascular;  Laterality: N/A;  . CARDIOVERSION N/A 08/26/2014   Procedure: CARDIOVERSION;  Surgeon: Sanda Klein, MD;  Location: [facility] ENDOSCOPY;  Service: Cardiovascular;  Laterality: N/A;  . CARDIOVERSION N/A 10/10/2016   Procedure: CARDIOVERSION;  Surgeon: Larey Dresser, MD;  Location: [facility] ENDOSCOPY;  Service: Cardiovascular;  Laterality: N/A;  . SKIN CANCER EXCISION    . TEE WITHOUT CARDIOVERSION  04/26/2011   Procedure: TRANSESOPHAGEAL ECHOCARDIOGRAM (TEE);  Surgeon: Larey Dresser, MD;  Location: [facility] ENDOSCOPY;  Service: Cardiovascular;  Laterality: N/A;  to be done at 1330  . TEE WITHOUT CARDIOVERSION N/A 04/28/2012   Procedure: TRANSESOPHAGEAL ECHOCARDIOGRAM (TEE);  Surgeon: Thayer Headings, MD;  Location: [place];  Service: Cardiovascular;  Laterality: N/A;  . WISDOM TOOTH EXTRACTION       Current Outpatient Prescriptions  Medication Sig Dispense Refill  . apixaban (ELIQUIS) 5 MG TABS tablet Take 1 tablet (5 mg total) by mouth 2 (two) times daily. 60 tablet 11  . atorvastatin (LIPITOR) 10 MG tablet Take 1 tablet (10 mg total) by mouth daily. 30 tablet 11  . calcium carbonate (TUMS - DOSED IN MG ELEMENTAL CALCIUM) 500 MG chewable tablet Chew 1 tablet by mouth daily.    . disopyramide (NORPACE CR) 150 MG 12 hr capsule Take 1-2 tablets twice daily (Patient taking differently: Take 2 tablets twice daily) 360 capsule 3  . fish  oil-omega-3 fatty acids 1000 MG capsule Take 1 g by mouth 3 (three) times a week.     Marland Kitchen ibuprofen (ADVIL,MOTRIN) 200 MG tablet Take 200 mg by mouth every 6 (six) hours as needed for moderate pain.     . Loratadine (CLARITIN) 10 MG CAPS Take 10 mg by mouth daily as needed (allergies).     . metoprolol tartrate (LOPRESSOR) 25 MG tablet Take 1 tablet (25 mg total) by mouth 2  (two) times daily. 60 tablet 11  . Multiple Vitamin (MULTIVITAMIN) tablet Take 1 tablet by mouth 3 (three) times a week.     . sildenafil (VIAGRA) 25 MG tablet Take 25 mg by mouth as needed for erectile dysfunction. Take as directed     No current facility-administered medications for this visit.     Allergies:   Patient has no known allergies.   Social History:  The patient  reports that he quit smoking about 40 years ago. His smoking use included Cigarettes. He has a 15.00 pack-year smoking history. He has never used smokeless tobacco. He reports that he drinks about 3.6 oz of alcohol per week . He reports that he does not use drugs.   Family History:  The patient's  family history includes Cancer in his father and mother; Coronary artery disease in his unknown relative.    ROS:  Please see the history of present illness.   All other systems are personally reviewed and negative.    PHYSICAL EXAM: VS:  BP 130/88   Pulse 72   Ht 5\' 6"  (1.676 m)   Wt 156 lb (70.8 kg)   SpO2 97%   BMI 25.18 kg/m  , BMI Body mass index is 25.18 kg/m. GEN: Well nourished, well developed, in no acute distress  HEENT: normal  Neck: no JVD, carotid bruits, or masses Cardiac: iRRR; no murmurs, rubs, or gallops,no edema  Respiratory:  clear to auscultation bilaterally, normal work of breathing GI: soft, nontender, nondistended, + BS MS: no deformity or atrophy  Skin: warm and dry  Neuro:  Strength and sensation are intact Psych: euthymic mood, full affect  EKG:  EKG is ordered today. The ekg ordered today is personally reviewed and shows afib, V rate 72 bpm, nonspecific ST/T changes   Recent Labs: 10/02/2016: BUN 14; Creatinine, Ser 0.97; Hemoglobin 15.7; Platelets 215; Potassium 4.1; Sodium 136  personally reviewed   Lipid Panel     Component Value Date/Time   CHOL 167 10/26/2015   TRIG 111 10/26/2015   HDL 47 10/26/2015   LDLCALC 98 10/26/2015   personally reviewed   Wt Readings from  Last 3 Encounters:  10/23/16 156 lb (70.8 kg)  10/16/16 154 lb (69.9 kg)  10/10/16 156 lb (70.8 kg)      Other studies personally reviewed: Additional studies/ records that were reviewed today include: recent echo  Review of the above records today demonstrates: apical hypertrophy, preserved EF   ASSESSMENT AND PLAN:  1.  Persistent afib The patient presents today with recurrent persistent afib.  He has failed medical therapy with norpace.  Therapeutic strategies for afib including medicine and ablation were discussed in detail with the patient today. Risk, benefits, and alternatives to EP study and radiofrequency ablation for afib were also discussed in detail today. These risks include but are not limited to stroke, bleeding, vascular damage, tamponade, perforation, damage to the esophagus, lungs, and other structures, pulmonary vein stenosis, worsening renal function, and death. The patient understands these risk and wishes to  proceed.  We will therefore proceed with catheter ablation at the next available time.  Will obtain cardiac CT to exclude LAA thrombus prior to ablation.   2. Hypertrophic CM Stable No change required today  Current medicines are reviewed at length with the patient today.   The patient does not have concerns regarding his medicines.  The following changes were made today:  none   Signed, [name], MD  10/23/2016 12:49 PM     [address] [place] [address] [phone] (office) [phone] (fax)

## 2016-10-23 NOTE — Telephone Encounter (Signed)
Returned call to patient and scheduled for date requested Labs with PCP on 11/07/16

## 2016-10-23 NOTE — Patient Instructions (Addendum)
Medication Instructions:  Your physician recommends that you continue on your current medications as directed. Please refer to the Current Medication list given to you today.   Labwork: Your physician recommends that you return for lab work on---11/13/16 with his PCP: BMP/CBC   Testing/Procedures:  Your physician has requested that you have cardiac CT. Cardiac computed tomography (CT) is a painless test that uses an x-ray machine to take clear, detailed pictures of your heart. For further information please visit [email or web address]. Please follow instruction sheet as given.---will need to be done week prior to ablation.  Your physician has recommended that you have an ablation. Catheter ablation is a medical procedure used to treat some cardiac arrhythmias (irregular heartbeats). During catheter ablation, a long, thin, flexible tube is put into a blood vessel in your groin (upper thigh), or neck. This tube is called an ablation catheter. It is then guided to your heart through the blood vessel. Radio frequency waves destroy small areas of heart tissue where abnormal heartbeats may cause an arrhythmia to start. Please see the instruction sheet given to you today.--11/21/16    Please arrive at [facility] at 7:30am Do not eat or drink after midnight the night prior to the procedure Do not take any medications the morning of the test Plan for one night stay Will need someone to drive you home at discharge           Follow-Up: Your physician recommends that you schedule a follow-up appointment in: 4 weeks from 11/21/16 in afib clinic and 3 months from 11/21/16 procedure with Dr. [name].   Any Other Special Instructions Will Be Listed Below (If Applicable).     If you need a refill on your cardiac medications before your next appointment, please call your pharmacy.

## 2016-10-29 ENCOUNTER — Encounter: Payer: Self-pay | Admitting: Internal Medicine

## 2016-11-05 ENCOUNTER — Encounter: Payer: Self-pay | Admitting: Internal Medicine

## 2016-11-07 ENCOUNTER — Encounter: Payer: Self-pay | Admitting: Internal Medicine

## 2016-11-07 ENCOUNTER — Ambulatory Visit (INDEPENDENT_AMBULATORY_CARE_PROVIDER_SITE_OTHER): Payer: BC Managed Care – PPO | Admitting: Internal Medicine

## 2016-11-07 ENCOUNTER — Other Ambulatory Visit (INDEPENDENT_AMBULATORY_CARE_PROVIDER_SITE_OTHER): Payer: BC Managed Care – PPO

## 2016-11-07 VITALS — BP 128/62 | HR 70 | Temp 97.7°F | Resp 16 | Ht 66.0 in | Wt 152.5 lb

## 2016-11-07 DIAGNOSIS — I480 Paroxysmal atrial fibrillation: Secondary | ICD-10-CM | POA: Diagnosis not present

## 2016-11-07 DIAGNOSIS — Z0000 Encounter for general adult medical examination without abnormal findings: Secondary | ICD-10-CM | POA: Diagnosis not present

## 2016-11-07 DIAGNOSIS — Z1159 Encounter for screening for other viral diseases: Secondary | ICD-10-CM

## 2016-11-07 DIAGNOSIS — N400 Enlarged prostate without lower urinary tract symptoms: Secondary | ICD-10-CM | POA: Diagnosis not present

## 2016-11-07 DIAGNOSIS — R972 Elevated prostate specific antigen [PSA]: Secondary | ICD-10-CM

## 2016-11-07 DIAGNOSIS — E785 Hyperlipidemia, unspecified: Secondary | ICD-10-CM

## 2016-11-07 DIAGNOSIS — Z23 Encounter for immunization: Secondary | ICD-10-CM | POA: Diagnosis not present

## 2016-11-07 LAB — COMPREHENSIVE METABOLIC PANEL
ALT: 26 U/L (ref 0–53)
AST: 19 U/L (ref 0–37)
Albumin: 4.4 g/dL (ref 3.5–5.2)
Alkaline Phosphatase: 60 U/L (ref 39–117)
BUN: 13 mg/dL (ref 6–23)
CO2: 31 mEq/L (ref 19–32)
Calcium: 9.6 mg/dL (ref 8.4–10.5)
Chloride: 102 mEq/L (ref 96–112)
Creatinine, Ser: 0.94 mg/dL (ref 0.40–1.50)
GFR: 83.51 mL/min (ref >60.00)
Glucose, Bld: 94 mg/dL (ref 70–99)
Potassium: 4.5 mEq/L (ref 3.5–5.1)
Sodium: 139 mEq/L (ref 135–145)
Total Bilirubin: 0.8 mg/dL (ref 0.2–1.2)
Total Protein: 6.5 g/dL (ref 6.0–8.3)

## 2016-11-07 LAB — LIPID PANEL
Cholesterol: 144 mg/dL (ref 0–200)
HDL: 39.7 mg/dL (ref >39.00)
LDL Cholesterol: 88 mg/dL (ref 0–99)
NonHDL: 104.14
Total CHOL/HDL Ratio: 4
Triglycerides: 79 mg/dL (ref 0.0–149.0)
VLDL: 15.8 mg/dL (ref 0.0–40.0)

## 2016-11-07 LAB — PSA: PSA: 3.76 ng/mL (ref 0.10–4.00)

## 2016-11-07 MED ORDER — ZOSTER VAC RECOMB ADJUVANTED 50 MCG/0.5ML IM SUSR: 0.5000 mL | Freq: Once | INTRAMUSCULAR | 1 refills | Status: AC

## 2016-11-07 NOTE — Patient Instructions (Signed)

## 2016-11-07 NOTE — Progress Notes (Signed)
Subjective:  Patient ID: Ronnie Black, male    DOB: February 07, 1945  Age: 71 y.o. MRN: [id number]  CC: Annual Exam; Hyperlipidemia; and Atrial Fibrillation   HPI Ronnie Black presents for a CPX. He complains of chronic mild palpitations, doe , and fatigue.  Past Medical History:  Diagnosis Date  . DDD (degenerative disc disease)   . HLD (hyperlipidemia)   . 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 Persistent atrial fibrillation ([place])    s/p PVI 3/12   Past Surgical History:  Procedure Laterality Date  . APPENDECTOMY    . atrial fibrillation ablation  03/27/10   PVI with CTI ablation by JA  . CARDIOVERSION    . CARDIOVERSION  04/26/2011   Procedure: CARDIOVERSION;  Surgeon: Larey Dresser, MD;  Location: [place];  Service: Cardiovascular;  Laterality: N/A;  . CARDIOVERSION N/A 04/28/2012   Procedure: CARDIOVERSION;  Surgeon: Thayer Headings, MD;  Location: [place];  Service: Cardiovascular;  Laterality: N/A;  . CARDIOVERSION N/A 07/28/2012   Procedure: CARDIOVERSION;  Surgeon: Lelon Perla, MD;  Location: [facility] ENDOSCOPY;  Service: Cardiovascular;  Laterality: N/A;  . CARDIOVERSION N/A 08/14/2012   Procedure: CARDIOVERSION;  Surgeon: Josue Hector, MD;  Location: [facility] ENDOSCOPY;  Service: Cardiovascular;  Laterality: N/A;  . CARDIOVERSION N/A 08/11/2014   Procedure: CARDIOVERSION;  Surgeon: Fay Records, MD;  Location: [facility] ENDOSCOPY;  Service: Cardiovascular;  Laterality: N/A;  . CARDIOVERSION N/A 08/26/2014   Procedure: CARDIOVERSION;  Surgeon: Sanda Klein, MD;  Location: [facility] ENDOSCOPY;  Service: Cardiovascular;  Laterality: N/A;  . CARDIOVERSION N/A 10/10/2016   Procedure: CARDIOVERSION;  Surgeon: Larey Dresser, MD;  Location: [facility] ENDOSCOPY;  Service: Cardiovascular;  Laterality: N/A;  . SKIN CANCER EXCISION    . TEE WITHOUT CARDIOVERSION  04/26/2011   Procedure: TRANSESOPHAGEAL ECHOCARDIOGRAM (TEE);  Surgeon: Larey Dresser, MD;  Location: [facility]  ENDOSCOPY;  Service: Cardiovascular;  Laterality: N/A;  to be done at 1330  . TEE WITHOUT CARDIOVERSION N/A 04/28/2012   Procedure: TRANSESOPHAGEAL ECHOCARDIOGRAM (TEE);  Surgeon: Thayer Headings, MD;  Location: [place];  Service: Cardiovascular;  Laterality: N/A;  . WISDOM TOOTH EXTRACTION      reports that he quit smoking about 40 years ago. His smoking use included Cigarettes. He has a 15.00 pack-year smoking history. He has never used smokeless tobacco. He reports that he drinks about 3.6 oz of alcohol per week . He reports that he does not use drugs. family history includes Cancer in his father and mother; Coronary artery disease in his unknown relative. No Known Allergies  Outpatient Medications Prior to Visit  Medication Sig Dispense Refill  . apixaban (ELIQUIS) 5 MG TABS tablet Take 1 tablet (5 mg total) by mouth 2 (two) times daily. 60 tablet 11  . calcium carbonate (TUMS - DOSED IN MG ELEMENTAL CALCIUM) 500 MG chewable tablet Chew 1 tablet by mouth daily.    . disopyramide (NORPACE CR) 150 MG 12 hr capsule Take 1-2 tablets twice daily (Patient taking differently: Take 2 tablets twice daily) 360 capsule 3  . fish oil-omega-3 fatty acids 1000 MG capsule Take 1 g by mouth 3 (three) times a week.     . metoprolol tartrate (LOPRESSOR) 25 MG tablet Take 1 tablet (25 mg total) by mouth 2 (two) times daily. 60 tablet 11  . sildenafil (VIAGRA) 25 MG tablet Take 25 mg by mouth as needed for erectile dysfunction. Take as directed    . atorvastatin (  LIPITOR) 10 MG tablet Take 1 tablet (10 mg total) by mouth daily. 30 tablet 11  . ibuprofen (ADVIL,MOTRIN) 200 MG tablet Take 200 mg by mouth every 6 (six) hours as needed for moderate pain.     . Loratadine (CLARITIN) 10 MG CAPS Take 10 mg by mouth daily as needed (allergies).     . Multiple Vitamin (MULTIVITAMIN) tablet Take 1 tablet by mouth 3 (three) times a week.      No facility-administered medications prior to visit.     ROS Review of  Systems  Constitutional: Positive for fatigue. Negative for activity change, diaphoresis and unexpected weight change.  HENT: Negative.   Eyes: Negative.   Respiratory: Positive for shortness of breath. Negative for apnea, cough, chest tightness, wheezing and stridor.   Cardiovascular: Negative.  Negative for chest pain, palpitations and leg swelling.  Gastrointestinal: Negative.  Negative for abdominal pain, blood in stool, constipation, diarrhea, nausea and vomiting.  Endocrine: Negative.  Negative for cold intolerance and heat intolerance.  Genitourinary: Negative.  Negative for difficulty urinating, dysuria, frequency, hematuria, scrotal swelling, testicular pain and urgency.  Musculoskeletal: Negative.   Skin: Negative.   Allergic/Immunologic: Negative.   Neurological: Negative for dizziness, syncope, weakness and light-headedness.  Hematological: Negative for adenopathy. Does not bruise/bleed easily.  Psychiatric/Behavioral: Negative.     Objective:  BP 128/62 (BP Location: Left Arm, Patient Position: Sitting, Cuff Size: Normal)   Pulse 70   Temp 97.7 F (36.5 C) (Oral)   Resp 16   Ht 5\' 6"  (1.676 m)   Wt 152 lb 8 oz (69.2 kg)   SpO2 98%   BMI 24.61 kg/m   BP Readings from Last 3 Encounters:  11/07/16 128/62  10/23/16 130/88  10/16/16 126/74    Wt Readings from Last 3 Encounters:  11/07/16 152 lb 8 oz (69.2 kg)  10/23/16 156 lb (70.8 kg)  10/16/16 154 lb (69.9 kg)    Physical Exam  Cardiovascular: Normal rate.  An irregularly irregular rhythm present.  Abdominal: Hernia confirmed negative in the right inguinal area and confirmed negative in the left inguinal area.  Genitourinary: Rectum normal, testes normal and penis normal. Rectal exam shows no external hemorrhoid, no internal hemorrhoid, no fissure, no mass, no tenderness, anal tone normal and guaiac negative stool. Prostate is enlarged (1+ smooth symm BPH). Prostate is not tender. Right testis shows no mass, no  swelling and no tenderness. Right testis is descended. Left testis shows no mass, no swelling and no tenderness. Left testis is descended. Circumcised. No penile erythema or penile tenderness. No discharge found.  Lymphadenopathy:       Right: No inguinal adenopathy present.       Left: No inguinal adenopathy present.    Lab Results  Component Value Date   WBC 6.3 10/02/2016   HGB 15.7 10/02/2016   HCT 47.0 10/02/2016   PLT 215 10/02/2016   GLUCOSE 94 11/07/2016   CHOL 144 11/07/2016   TRIG 79.0 11/07/2016   HDL 39.70 11/07/2016   LDLCALC 88 11/07/2016   ALT 26 11/07/2016   AST 19 11/07/2016   NA 139 11/07/2016   K 4.5 11/07/2016   CL 102 11/07/2016   CREATININE 0.94 11/07/2016   BUN 13 11/07/2016   CO2 31 11/07/2016   TSH 3.07 11/07/2016   PSA 3.76 11/07/2016   INR 1.3 (H) 02/15/2010    No results found.  Assessment & Plan:   Ronnie Black was seen today for annual exam, hyperlipidemia and atrial fibrillation.  Diagnoses and all orders for this visit:  Hyperlipidemia LDL goal <100- he has an elevated ASCVD risk score, will restart the statin -     Lipid panel; Future -     Comprehensive metabolic panel; Future -     Thyroid Panel With TSH; Future -     atorvastatin (LIPITOR) 10 MG tablet; Take 1 tablet (10 mg total) by mouth daily.  Need for hepatitis C screening test -     Hepatitis C antibody; Future  Paroxysmal atrial fibrillation ([place])- he has good rate control, will cont the NOAC -     Thyroid Panel With TSH; Future  Benign prostatic hyperplasia without lower urinary tract symptoms- he has no sx's that need to be treated -     PSA; Future  Routine general medical examination at a health care facility -     Zoster Vaccine Adjuvanted [facility]) injection; Inject 0.5 mLs into the muscle once.  Need for Tdap vaccination -     Tdap vaccine greater than or equal to 7yo IM  Need for influenza vaccination -     Flu vaccine HIGH DOSE PF (Fluzone High dose)  Rising  PSA level- he will let me know if he wants to recheck in 3-4 months go to urology for evaluation   I have discontinued Ronnie Black multivitamin, Loratadine, and ibuprofen. I am also having him start on Zoster Vaccine Adjuvanted. Additionally, I am having him maintain his fish oil-omega-3 fatty acids, sildenafil, metoprolol tartrate, disopyramide, calcium carbonate, apixaban, and atorvastatin.  Meds ordered this encounter  Medications  . Zoster Vaccine Adjuvanted [facility]) injection    Sig: Inject 0.5 mLs into the muscle once.    Dispense:  0.5 mL    Refill:  1  . atorvastatin (LIPITOR) 10 MG tablet    Sig: Take 1 tablet (10 mg total) by mouth daily.    Dispense:  90 tablet    Refill:  1   See AVS for instructions about healthy living and anticipatory guidance.  Follow-up: Return in about 1 year (around 11/07/2017).  Scarlette Calico, MD

## 2016-11-07 NOTE — Telephone Encounter (Signed)
Cardiac CT scheduled for 11/15/16.  Instructions given

## 2016-11-08 LAB — THYROID PANEL WITH TSH
Free Thyroxine Index: 2.3 (ref 1.4–3.8)
T3 Uptake: 32 % (ref 22–35)
T4, Total: 7.3 ug/dL (ref 4.9–10.5)
TSH: 3.07 mIU/L (ref 0.40–4.50)

## 2016-11-08 LAB — HEPATITIS C ANTIBODY
Hepatitis C Ab: NONREACTIVE
SIGNAL TO CUT-OFF: 0.01 (ref <1.00)

## 2016-11-09 ENCOUNTER — Encounter: Payer: Self-pay | Admitting: Internal Medicine

## 2016-11-12 DIAGNOSIS — R972 Elevated prostate specific antigen [PSA]: Secondary | ICD-10-CM | POA: Insufficient documentation

## 2016-11-12 MED ORDER — ATORVASTATIN CALCIUM 10 MG PO TABS: 10.0000 mg | ORAL_TABLET | Freq: Every day | ORAL | 1 refills | Status: DC

## 2016-11-12 NOTE — Assessment & Plan Note (Addendum)

## 2016-11-13 ENCOUNTER — Encounter: Payer: Self-pay | Admitting: Internal Medicine

## 2016-11-15 ENCOUNTER — Ambulatory Visit (HOSPITAL_COMMUNITY)
Admission: RE | Admit: 2016-11-15 | Discharge: 2016-11-15 | Disposition: A | Discharge status: Home / Self Care | Payer: BC Managed Care – PPO | Source: Ambulatory Visit | Attending: Internal Medicine | Admitting: Internal Medicine

## 2016-11-15 ENCOUNTER — Other Ambulatory Visit: Payer: Self-pay | Admitting: Cardiology

## 2016-11-15 DIAGNOSIS — I517 Cardiomegaly: Secondary | ICD-10-CM | POA: Insufficient documentation

## 2016-11-15 DIAGNOSIS — I481 Persistent atrial fibrillation: Secondary | ICD-10-CM | POA: Insufficient documentation

## 2016-11-15 DIAGNOSIS — I4819 Other persistent atrial fibrillation: Secondary | ICD-10-CM

## 2016-11-15 DIAGNOSIS — I4891 Unspecified atrial fibrillation: Secondary | ICD-10-CM | POA: Diagnosis not present

## 2016-11-15 MED ORDER — NITROGLYCERIN 0.4 MG SL SUBL
0.8000 mg | SUBLINGUAL_TABLET | Freq: Once | SUBLINGUAL | Status: AC
  Administered 2016-11-15: 0.8 mg via SUBLINGUAL
  Filled 2016-11-15: qty 25

## 2016-11-15 MED ORDER — IOPAMIDOL (ISOVUE-370) INJECTION 76%
INTRAVENOUS | Status: AC
  Administered 2016-11-15: 100 mL
  Filled 2016-11-15: qty 100

## 2016-11-15 MED ORDER — NITROGLYCERIN 0.4 MG SL SUBL
SUBLINGUAL_TABLET | SUBLINGUAL | Status: AC
  Filled 2016-11-15: qty 2

## 2016-11-15 NOTE — Progress Notes (Signed)
CT scan completed. Pt with BP in the 90's. No complain of dizziness. Pt states he feels fine. Dr. Meda Coffee notified, ok to D/C pt if feeling ok. Pt drank to cans of ginger ale and ate cookies.

## 2016-11-15 NOTE — Progress Notes (Signed)
D/C home walking. In no distress. Pt states he feels fine. Denies any dizziness.

## 2016-11-21 ENCOUNTER — Encounter (HOSPITAL_COMMUNITY): Payer: Self-pay | Admitting: Certified Registered Nurse Anesthetist

## 2016-11-21 ENCOUNTER — Ambulatory Visit (HOSPITAL_COMMUNITY)
Admission: RE | Admit: 2016-11-21 | Discharge: 2016-11-22 | Disposition: A | Discharge status: Home / Self Care | Payer: BC Managed Care – PPO | Source: Ambulatory Visit | Attending: Internal Medicine | Admitting: Internal Medicine

## 2016-11-21 ENCOUNTER — Ambulatory Visit (HOSPITAL_COMMUNITY): Payer: BC Managed Care – PPO | Admitting: Anesthesiology

## 2016-11-21 ENCOUNTER — Encounter (HOSPITAL_COMMUNITY)
Admission: RE | Disposition: A | Discharge status: Home / Self Care | Payer: Self-pay | Source: Ambulatory Visit | Attending: Internal Medicine

## 2016-11-21 DIAGNOSIS — E785 Hyperlipidemia, unspecified: Secondary | ICD-10-CM | POA: Diagnosis not present

## 2016-11-21 DIAGNOSIS — Z7901 Long term (current) use of anticoagulants: Secondary | ICD-10-CM | POA: Insufficient documentation

## 2016-11-21 DIAGNOSIS — I10 Essential (primary) hypertension: Secondary | ICD-10-CM | POA: Insufficient documentation

## 2016-11-21 DIAGNOSIS — M1990 Unspecified osteoarthritis, unspecified site: Secondary | ICD-10-CM | POA: Diagnosis not present

## 2016-11-21 DIAGNOSIS — Z87891 Personal history of nicotine dependence: Secondary | ICD-10-CM | POA: Diagnosis not present

## 2016-11-21 DIAGNOSIS — I422 Other hypertrophic cardiomyopathy: Secondary | ICD-10-CM | POA: Insufficient documentation

## 2016-11-21 DIAGNOSIS — K219 Gastro-esophageal reflux disease without esophagitis: Secondary | ICD-10-CM | POA: Insufficient documentation

## 2016-11-21 DIAGNOSIS — I493 Ventricular premature depolarization: Secondary | ICD-10-CM | POA: Diagnosis not present

## 2016-11-21 DIAGNOSIS — I4819 Other persistent atrial fibrillation: Secondary | ICD-10-CM | POA: Diagnosis present

## 2016-11-21 DIAGNOSIS — I481 Persistent atrial fibrillation: Secondary | ICD-10-CM | POA: Diagnosis not present

## 2016-11-21 HISTORY — PX: ATRIAL FIBRILLATION ABLATION: EP1191

## 2016-11-21 LAB — CBC
HCT: 44.4 % (ref 39.0–52.0)
Hemoglobin: 14.9 g/dL (ref 13.0–17.0)
MCH: 28.9 pg (ref 26.0–34.0)
MCHC: 33.6 g/dL (ref 30.0–36.0)
MCV: 86 fL (ref 78.0–100.0)
Platelets: 163 10*3/uL (ref 150–400)
RBC: 5.16 MIL/uL (ref 4.22–5.81)
RDW: 12.9 % (ref 11.5–15.5)
WBC: 5.1 10*3/uL (ref 4.0–10.5)

## 2016-11-21 LAB — BASIC METABOLIC PANEL
Anion gap: 7 (ref 5–15)
BUN: 10 mg/dL (ref 6–20)
CO2: 26 mmol/L (ref 22–32)
Calcium: 9.1 mg/dL (ref 8.9–10.3)
Chloride: 104 mmol/L (ref 101–111)
Creatinine, Ser: 0.88 mg/dL (ref 0.61–1.24)
GFR calc Af Amer: >60 mL/min (ref >60)
GFR calc non Af Amer: >60 mL/min (ref >60)
Glucose, Bld: 87 mg/dL (ref 65–99)
Potassium: 4.1 mmol/L (ref 3.5–5.1)
Sodium: 137 mmol/L (ref 135–145)

## 2016-11-21 LAB — POCT ACTIVATED CLOTTING TIME
Activated Clotting Time: 268 seconds
Activated Clotting Time: 301 seconds
Activated Clotting Time: 318 seconds
Activated Clotting Time: 323 seconds
Activated Clotting Time: 169 seconds

## 2016-11-21 SURGERY — ATRIAL FIBRILLATION ABLATION
Anesthesia: General

## 2016-11-21 MED ORDER — HEPARIN SODIUM (PORCINE) 1000 UNIT/ML IJ SOLN
INTRAMUSCULAR | Status: DC | PRN
  Administered 2016-11-21: 2000 [IU] via INTRAVENOUS
  Administered 2016-11-21: 1000 [IU] via INTRAVENOUS
  Administered 2016-11-21: 4000 [IU] via INTRAVENOUS
  Administered 2016-11-21: 2000 [IU] via INTRAVENOUS

## 2016-11-21 MED ORDER — HYDROCODONE-ACETAMINOPHEN 5-325 MG PO TABS: 1.0000 | ORAL_TABLET | ORAL | Status: DC | PRN

## 2016-11-21 MED ORDER — HEPARIN SODIUM (PORCINE) 1000 UNIT/ML IJ SOLN
INTRAMUSCULAR | Status: DC | PRN
  Administered 2016-11-21: 1000 [IU] via INTRAVENOUS

## 2016-11-21 MED ORDER — HEPARIN (PORCINE) IN NACL 2-0.9 UNIT/ML-% IJ SOLN
INTRAMUSCULAR | Status: AC
  Filled 2016-11-21: qty 500

## 2016-11-21 MED ORDER — PROPOFOL 10 MG/ML IV BOLUS
INTRAVENOUS | Status: DC | PRN
  Administered 2016-11-21: 100 mg via INTRAVENOUS
  Administered 2016-11-21: 40 mg via INTRAVENOUS

## 2016-11-21 MED ORDER — HEPARIN SODIUM (PORCINE) 1000 UNIT/ML IJ SOLN
INTRAMUSCULAR | Status: AC
  Filled 2016-11-21: qty 1

## 2016-11-21 MED ORDER — APIXABAN 5 MG PO TABS
5.0000 mg | ORAL_TABLET | Freq: Two times a day (BID) | ORAL | Status: DC
  Administered 2016-11-21 – 2016-11-22 (×2): 5 mg via ORAL
  Filled 2016-11-21 (×2): qty 1

## 2016-11-21 MED ORDER — IOPAMIDOL (ISOVUE-370) INJECTION 76%
INTRAVENOUS | Status: AC
  Filled 2016-11-21: qty 50

## 2016-11-21 MED ORDER — SODIUM CHLORIDE 0.9% FLUSH: 3.0000 mL | INTRAVENOUS | Status: DC | PRN

## 2016-11-21 MED ORDER — MIDAZOLAM HCL 5 MG/5ML IJ SOLN
INTRAMUSCULAR | Status: DC | PRN
  Administered 2016-11-21: 1 mg via INTRAVENOUS

## 2016-11-21 MED ORDER — ONDANSETRON HCL 4 MG/2ML IJ SOLN: 4.0000 mg | Freq: Four times a day (QID) | INTRAMUSCULAR | Status: DC | PRN

## 2016-11-21 MED ORDER — IOPAMIDOL (ISOVUE-370) INJECTION 76%
INTRAVENOUS | Status: DC | PRN
  Administered 2016-11-21: 3 mL via INTRAVENOUS

## 2016-11-21 MED ORDER — SODIUM CHLORIDE 0.9 % IV SOLN
INTRAVENOUS | Status: DC
  Administered 2016-11-21: 09:00:00 via INTRAVENOUS

## 2016-11-21 MED ORDER — EPHEDRINE SULFATE 50 MG/ML IJ SOLN: INTRAMUSCULAR | Status: DC | PRN

## 2016-11-21 MED ORDER — ACETAMINOPHEN 325 MG PO TABS: 650.0000 mg | ORAL_TABLET | ORAL | Status: DC | PRN

## 2016-11-21 MED ORDER — SODIUM CHLORIDE 0.9% FLUSH
3.0000 mL | Freq: Two times a day (BID) | INTRAVENOUS | Status: DC
  Administered 2016-11-21 – 2016-11-22 (×3): 3 mL via INTRAVENOUS

## 2016-11-21 MED ORDER — HEPARIN (PORCINE) IN NACL 2-0.9 UNIT/ML-% IJ SOLN
INTRAMUSCULAR | Status: AC | PRN
  Administered 2016-11-21: 500 mL

## 2016-11-21 MED ORDER — METOPROLOL TARTRATE 25 MG PO TABS
25.0000 mg | ORAL_TABLET | Freq: Two times a day (BID) | ORAL | Status: DC
  Administered 2016-11-21 – 2016-11-22 (×2): 25 mg via ORAL
  Filled 2016-11-21 (×2): qty 1

## 2016-11-21 MED ORDER — LIDOCAINE HCL (CARDIAC) 20 MG/ML IV SOLN
INTRAVENOUS | Status: DC | PRN
  Administered 2016-11-21: 20 mg via INTRAVENOUS

## 2016-11-21 MED ORDER — ALBUMIN HUMAN 5 % IV SOLN
INTRAVENOUS | Status: DC | PRN
  Administered 2016-11-21: 14:00:00 via INTRAVENOUS

## 2016-11-21 MED ORDER — BUPIVACAINE HCL (PF) 0.25 % IJ SOLN
INTRAMUSCULAR | Status: DC | PRN
  Administered 2016-11-21: 30 mL

## 2016-11-21 MED ORDER — ONDANSETRON HCL 4 MG/2ML IJ SOLN
INTRAMUSCULAR | Status: DC | PRN
  Administered 2016-11-21: 4 mg via INTRAVENOUS

## 2016-11-21 MED ORDER — FENTANYL CITRATE (PF) 100 MCG/2ML IJ SOLN
INTRAMUSCULAR | Status: DC | PRN
  Administered 2016-11-21 (×2): 25 ug via INTRAVENOUS

## 2016-11-21 MED ORDER — DISOPYRAMIDE PHOSPHATE ER 150 MG PO CP12
150.0000 mg | ORAL_CAPSULE | Freq: Two times a day (BID) | ORAL | Status: DC
  Administered 2016-11-21 – 2016-11-22 (×2): 150 mg via ORAL
  Filled 2016-11-21 (×2): qty 1

## 2016-11-21 MED ORDER — DEXTROSE 5 % IV SOLN
INTRAVENOUS | Status: DC | PRN
  Administered 2016-11-21: 40 ug/min via INTRAVENOUS

## 2016-11-21 MED ORDER — BUPIVACAINE HCL (PF) 0.25 % IJ SOLN
INTRAMUSCULAR | Status: AC
  Filled 2016-11-21: qty 30

## 2016-11-21 MED ORDER — DISOPYRAMIDE PHOSPHATE ER 150 MG PO CP12
150.0000 mg | ORAL_CAPSULE | Freq: Two times a day (BID) | ORAL | Status: DC
  Filled 2016-11-21: qty 1

## 2016-11-21 MED ORDER — PROTAMINE SULFATE 10 MG/ML IV SOLN
INTRAVENOUS | Status: DC | PRN
  Administered 2016-11-21 (×3): 10 mg via INTRAVENOUS

## 2016-11-21 MED ORDER — SODIUM CHLORIDE 0.9 % IV SOLN: 250.0000 mL | INTRAVENOUS | Status: DC | PRN

## 2016-11-21 MED ORDER — METOPROLOL TARTRATE 25 MG PO TABS: 25.0000 mg | ORAL_TABLET | Freq: Two times a day (BID) | ORAL | Status: DC

## 2016-11-21 SURGICAL SUPPLY — 19 items
BAG SNAP BAND KOVER 36X36 (MISCELLANEOUS) ×2 IMPLANT
BLANKET WARM UNDERBOD FULL ACC (MISCELLANEOUS) ×2 IMPLANT
CATH MAPPNG PENTARAY F 2-6-2MM (CATHETERS) IMPLANT
CATH SMTCH THERMOCOOL SF DF (CATHETERS) ×1 IMPLANT
CATH SOUNDSTAR 3D IMAGING (CATHETERS) ×1 IMPLANT
CATH WEBSTER BI DIR CS D-F CRV (CATHETERS) ×1 IMPLANT
COVER SWIFTLINK CONNECTOR (BAG) ×2 IMPLANT
NDL TRANSEP BRK 71CM 407200 (NEEDLE) IMPLANT
NEEDLE TRANSEP BRK 71CM 407200 (NEEDLE) ×2 IMPLANT
PACK EP LATEX FREE (CUSTOM PROCEDURE TRAY) ×2
PACK EP LF (CUSTOM PROCEDURE TRAY) ×1 IMPLANT
PAD DEFIB LIFELINK (PAD) ×2 IMPLANT
PATCH CARTO3 (PAD) ×1 IMPLANT
PENTARAY F 2-6-2MM (CATHETERS) ×2
SHEATH AVANTI 11F 11CM (SHEATH) ×1 IMPLANT
SHEATH BAYLIS TORFLEX (SHEATH) ×2 IMPLANT
SHEATH PINNACLE 7F 10CM (SHEATH) ×2 IMPLANT
SHEATH PINNACLE 9F 10CM (SHEATH) ×1 IMPLANT
TUBING SMART ABLATE COOLFLOW (TUBING) ×1 IMPLANT

## 2016-11-21 NOTE — H&P (View-Only) (Signed)
Electrophysiology Office Note   Date:  10/23/2016   ID:  Ronnie Black, DOB 03-24-1945, MRN [id number]  PCP:  [name], MD    Primary Electrophysiologist: [name], MD    Chief Complaint  Patient presents with  . Atrial Fibrillation     History of Present Illness: Ronnie Black is a 71 y.o. male who presents today for electrophysiology evaluation.   He has recently returned to afib.  He has symptoms of palpitations and fatigue.  He is notices this mostly at home when exercising.  He presented and underwent cardioversion.  He felt better initially but unfortunately returned to afib in about 4 days.  He has been in afib since that time.  Today, he denies symptoms of palpitations, chest pain, shortness of breath, orthopnea, PND, lower extremity edema, claudication, dizziness, presyncope, syncope, bleeding, or neurologic sequela. The patient is tolerating medications without difficulties and is otherwise without complaint today.    Past Medical History:  Diagnosis Date  . DDD (degenerative disc disease)   . HLD (hyperlipidemia)   . 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 Persistent atrial fibrillation ([place])    s/p PVI 3/12   Past Surgical History:  Procedure Laterality Date  . APPENDECTOMY    . atrial fibrillation ablation  03/27/10   PVI with CTI ablation by JA  . CARDIOVERSION    . CARDIOVERSION  04/26/2011   Procedure: CARDIOVERSION;  Surgeon: Larey Dresser, MD;  Location: [place];  Service: Cardiovascular;  Laterality: N/A;  . CARDIOVERSION N/A 04/28/2012   Procedure: CARDIOVERSION;  Surgeon: Thayer Headings, MD;  Location: [place];  Service: Cardiovascular;  Laterality: N/A;  . CARDIOVERSION N/A 07/28/2012   Procedure: CARDIOVERSION;  Surgeon: Lelon Perla, MD;  Location: [facility] ENDOSCOPY;  Service: Cardiovascular;  Laterality: N/A;  . CARDIOVERSION N/A 08/14/2012   Procedure: CARDIOVERSION;  Surgeon: Josue Hector, MD;   Location: [facility] ENDOSCOPY;  Service: Cardiovascular;  Laterality: N/A;  . CARDIOVERSION N/A 08/11/2014   Procedure: CARDIOVERSION;  Surgeon: Fay Records, MD;  Location: [facility] ENDOSCOPY;  Service: Cardiovascular;  Laterality: N/A;  . CARDIOVERSION N/A 08/26/2014   Procedure: CARDIOVERSION;  Surgeon: Sanda Klein, MD;  Location: [facility] ENDOSCOPY;  Service: Cardiovascular;  Laterality: N/A;  . CARDIOVERSION N/A 10/10/2016   Procedure: CARDIOVERSION;  Surgeon: Larey Dresser, MD;  Location: [facility] ENDOSCOPY;  Service: Cardiovascular;  Laterality: N/A;  . SKIN CANCER EXCISION    . TEE WITHOUT CARDIOVERSION  04/26/2011   Procedure: TRANSESOPHAGEAL ECHOCARDIOGRAM (TEE);  Surgeon: Larey Dresser, MD;  Location: [facility] ENDOSCOPY;  Service: Cardiovascular;  Laterality: N/A;  to be done at 1330  . TEE WITHOUT CARDIOVERSION N/A 04/28/2012   Procedure: TRANSESOPHAGEAL ECHOCARDIOGRAM (TEE);  Surgeon: Thayer Headings, MD;  Location: [place];  Service: Cardiovascular;  Laterality: N/A;  . WISDOM TOOTH EXTRACTION       Current Outpatient Prescriptions  Medication Sig Dispense Refill  . apixaban (ELIQUIS) 5 MG TABS tablet Take 1 tablet (5 mg total) by mouth 2 (two) times daily. 60 tablet 11  . atorvastatin (LIPITOR) 10 MG tablet Take 1 tablet (10 mg total) by mouth daily. 30 tablet 11  . calcium carbonate (TUMS - DOSED IN MG ELEMENTAL CALCIUM) 500 MG chewable tablet Chew 1 tablet by mouth daily.    . disopyramide (NORPACE CR) 150 MG 12 hr capsule Take 1-2 tablets twice daily (Patient taking differently: Take 2 tablets twice daily) 360 capsule 3  . fish  oil-omega-3 fatty acids 1000 MG capsule Take 1 g by mouth 3 (three) times a week.     Marland Kitchen ibuprofen (ADVIL,MOTRIN) 200 MG tablet Take 200 mg by mouth every 6 (six) hours as needed for moderate pain.     . Loratadine (CLARITIN) 10 MG CAPS Take 10 mg by mouth daily as needed (allergies).     . metoprolol tartrate (LOPRESSOR) 25 MG tablet Take 1 tablet (25 mg total) by mouth 2  (two) times daily. 60 tablet 11  . Multiple Vitamin (MULTIVITAMIN) tablet Take 1 tablet by mouth 3 (three) times a week.     . sildenafil (VIAGRA) 25 MG tablet Take 25 mg by mouth as needed for erectile dysfunction. Take as directed     No current facility-administered medications for this visit.     Allergies:   Patient has no known allergies.   Social History:  The patient  reports that he quit smoking about 40 years ago. His smoking use included Cigarettes. He has a 15.00 pack-year smoking history. He has never used smokeless tobacco. He reports that he drinks about 3.6 oz of alcohol per week . He reports that he does not use drugs.   Family History:  The patient's  family history includes Cancer in his father and mother; Coronary artery disease in his unknown relative.    ROS:  Please see the history of present illness.   All other systems are personally reviewed and negative.    PHYSICAL EXAM: VS:  BP 130/88   Pulse 72   Ht 5\' 6"  (1.676 m)   Wt 156 lb (70.8 kg)   SpO2 97%   BMI 25.18 kg/m  , BMI Body mass index is 25.18 kg/m. GEN: Well nourished, well developed, in no acute distress  HEENT: normal  Neck: no JVD, carotid bruits, or masses Cardiac: iRRR; no murmurs, rubs, or gallops,no edema  Respiratory:  clear to auscultation bilaterally, normal work of breathing GI: soft, nontender, nondistended, + BS MS: no deformity or atrophy  Skin: warm and dry  Neuro:  Strength and sensation are intact Psych: euthymic mood, full affect  EKG:  EKG is ordered today. The ekg ordered today is personally reviewed and shows afib, V rate 72 bpm, nonspecific ST/T changes   Recent Labs: 10/02/2016: BUN 14; Creatinine, Ser 0.97; Hemoglobin 15.7; Platelets 215; Potassium 4.1; Sodium 136  personally reviewed   Lipid Panel     Component Value Date/Time   CHOL 167 10/26/2015   TRIG 111 10/26/2015   HDL 47 10/26/2015   LDLCALC 98 10/26/2015   personally reviewed   Wt Readings from  Last 3 Encounters:  10/23/16 156 lb (70.8 kg)  10/16/16 154 lb (69.9 kg)  10/10/16 156 lb (70.8 kg)      Other studies personally reviewed: Additional studies/ records that were reviewed today include: recent echo  Review of the above records today demonstrates: apical hypertrophy, preserved EF   ASSESSMENT AND PLAN:  1.  Persistent afib The patient presents today with recurrent persistent afib.  He has failed medical therapy with norpace.  Therapeutic strategies for afib including medicine and ablation were discussed in detail with the patient today. Risk, benefits, and alternatives to EP study and radiofrequency ablation for afib were also discussed in detail today. These risks include but are not limited to stroke, bleeding, vascular damage, tamponade, perforation, damage to the esophagus, lungs, and other structures, pulmonary vein stenosis, worsening renal function, and death. The patient understands these risk and wishes to  proceed.  We will therefore proceed with catheter ablation at the next available time.  Will obtain cardiac CT to exclude LAA thrombus prior to ablation.   2. Hypertrophic CM Stable No change required today  Current medicines are reviewed at length with the patient today.   The patient does not have concerns regarding his medicines.  The following changes were made today:  none   Signed, [name], MD  10/23/2016 12:49 PM     [address] [place] [address] [phone] (office) [phone] (fax)

## 2016-11-21 NOTE — Discharge Instructions (Signed)
No driving for 4 days. No lifting over 5 lbs for 1 week. No sexual activity for 1 week. You may return to work in 1 week. Keep procedure site clean & dry. If you notice increased pain, swelling, bleeding or pus, call/return!  You may shower, but no soaking baths/hot tubs/pools for 1 week.  ° ° °You have an appointment set up with the Atrial Fibrillation Clinic.  Multiple studies have shown that being followed by a dedicated atrial fibrillation clinic in addition to the standard care you receive from your other physicians improves health. We believe that enrollment in the atrial fibrillation clinic will allow us to better care for you.  ° °The phone number to the Atrial Fibrillation Clinic is [phone]. The clinic is staffed Monday through Friday from 8:30am to 5pm. ° °Parking Directions: The clinic is located in the Heart and Vascular Building connected to [facility]. °1)From [address] turn on to [address] and go to the 3rd entrance  (Heart and Vascular entrance) on the right. °2)Look to the right for Heart &Vascular Parking Garage. °3)A code for the entrance is required please call the clinic to receive this.   °4)Take the elevators to the 1st floor. Registration is in the room with the glass walls at the end of the hallway. ° °If you have any trouble parking or locating the clinic, please don’t hesitate to call [phone]. ° ° °

## 2016-11-21 NOTE — Discharge Summary (Signed)
ELECTROPHYSIOLOGY PROCEDURE DISCHARGE SUMMARY    Patient ID: Ronnie Black,  MRN: [id number], DOB/AGE: 1945-11-06 71 y.o.  Admit date: 11/21/2016 Discharge date: 11/22/2016  Primary Care Physician: [name], MD Electrophysiologist: [name], MD  Primary Discharge Diagnosis:  Persistent atrial fibrillation status post ablation this admission  Secondary Discharge Diagnosis:  1.  Hypertrophic cardiomyopathy 2.  Hyperlipidemia 3.  DDD  Procedures This Admission:  1.  Electrophysiology study and radiofrequency catheter ablation on 11/21/16 by Dr [name].  This study demonstrated atrial fibrillation upon presentation; intracardiac echo reveals a moderate to large sized left atrium; return of electrical activity within the left superior pulmonary vein and right inferior pulmonary vein.  The left inferior and right superior PVs were quiescent from a prior ablation procedure.  As the prior ablation was an antral isolation procedure, I elected to perform WACA around all four PVs today; additional mapping and ablation within the left atrium due to persistence of atrial fibrillation with a posterior wall box demonstrated; atrial fibrillation successfully cardioverted to sinus rhythm; additional ablation of a PAC focus was performed along the proximal third of the coronary sinus body; no inducible arrhythmias following ablation; no early apparent complications.    Brief HPI: Ronnie Black is a 71 y.o. male with a history of persistent atrial fibrillation.  They have failed medical therapy with Norpace. Risks, benefits, and alternatives to catheter ablation of atrial fibrillation were reviewed with the patient who wished to proceed.  The patient underwent cardiac CT prior to the procedure which demonstrated no LAA thrombus.    Hospital Course:  The patient was admitted and underwent EPS/RFCA of atrial fibrillation with details as outlined above.  They were monitored on telemetry  overnight which demonstrated sinus rhythm with brief run of AF.  Groin was without complication on the day of discharge.  The patient was examined and considered to be stable for discharge.  Wound care and restrictions were reviewed with the patient.  The patient will be seen back by Roderic Palau, NP in 4 weeks and Dr [name] in 12 weeks for post ablation follow up.   This patients CHA2DS2-VASc Score and unadjusted Ischemic Stroke Rate (% per year) is equal to 0.6 % stroke rate/year from a score of 1 Above score calculated as 1 point each if present [CHF, HTN, DM, Vascular=MI/PAD/Aortic Plaque, Age if 65-74, or Male] Above score calculated as 2 points each if present [Age > 75, or Stroke/TIA/TE]   Physical Exam: Vitals:   11/21/16 1521 11/21/16 1600 11/21/16 2059 11/22/16 0500  BP: 118/70  114/72 (!) 148/85  Pulse:   77 87  Resp: 15  18 18   Temp: 97.8 F (36.6 C)  99.1 F (37.3 C) 98.8 F (37.1 C)  TempSrc: Oral  Oral Oral  SpO2: 96% 96% 97% 90%  Weight:    153 lb 4.8 oz (69.5 kg)  Height:        GEN- The patient is well appearing, alert and oriented x 3 today.   HEENT: normocephalic, atraumatic; sclera clear, conjunctiva pink; hearing intact; oropharynx clear; neck supple  Lungs- Clear to ausculation bilaterally, normal work of breathing.  No wheezes, rales, rhonchi Heart- Regular rate and rhythm  GI- soft, non-tender, non-distended, bowel sounds present  Extremities- no clubbing, cyanosis, or edema; DP/PT/radial pulses 2+ bilaterally, groin without hematoma/bruit MS- no significant deformity or atrophy Skin- warm and dry, no rash or lesion Psych- euthymic mood, full affect Neuro- strength and sensation are intact  Labs:   Lab Results  Component Value Date   WBC 5.1 11/21/2016   HGB 14.9 11/21/2016   HCT 44.4 11/21/2016   MCV 86.0 11/21/2016   PLT 163 11/21/2016     Recent Labs Lab 11/21/16 0905  NA 137  K 4.1  CL 104  CO2 26  BUN 10  CREATININE 0.88    CALCIUM 9.1  GLUCOSE 87     Discharge Medications:  Allergies as of 11/22/2016   No Known Allergies     Medication List    TAKE these medications   apixaban 5 MG Tabs tablet Commonly known as:  ELIQUIS Take 1 tablet (5 mg total) by mouth 2 (two) times daily.   atorvastatin 10 MG tablet Commonly known as:  LIPITOR Take 1 tablet (10 mg total) by mouth daily.   disopyramide 150 MG 12 hr capsule Commonly known as:  NORPACE CR Take 1-2 tablets twice daily What changed:  how much to take  how to take this  when to take this  additional instructions   fish oil-omega-3 fatty acids 1000 MG capsule Take 1 g by mouth 3 (three) times a week.   fluconazole 0.2 % ophthalmic solution Commonly known as:  DIFLUCAN   metoprolol tartrate 25 MG tablet Commonly known as:  LOPRESSOR Take 1 tablet (25 mg total) by mouth 2 (two) times daily.   sildenafil 25 MG tablet Commonly known as:  VIAGRA Take 25 mg by mouth as needed for erectile dysfunction. Take as directed       Disposition:  Discharge Instructions    Diet - low sodium heart healthy    Complete by:  As directed    Increase activity slowly    Complete by:  As directed      Follow-up Information    [facility] Follow up on 12/19/2016.   Specialty:  Cardiology Why:  at [facility] information: [address] [id number] Danice Goltz [place] [address] [phone]       [name], MD Follow up on 02/24/2017.   Specialty:  Cardiology Why:  at [facility] information: [place] [address] [phone]           Duration of Discharge Encounter: Greater than 30 minutes including physician time.  Signed, Chanetta Marshall, NP 11/22/2016 9:24 AM  [name] MD, [facility] 11/22/2016 3:14 PM

## 2016-11-21 NOTE — Transfer of Care (Signed)
Immediate Anesthesia Transfer of Care Note  Patient: Ronnie Black  Procedure(s) Performed: ATRIAL FIBRILLATION ABLATION (N/A )  Patient Location: Cath Lab  Anesthesia Type:General  Level of Consciousness: awake, oriented and patient cooperative  Airway & Oxygen Therapy: Patient Spontanous Breathing and Patient connected to nasal cannula oxygen  Post-op Assessment: Report given to RN, Post -op Vital signs reviewed and stable and Patient moving all extremities X 4  Post vital signs: Reviewed and stable  Last Vitals:  Vitals:   11/21/16 1408 11/21/16 1410  BP: (!) 98/56 (!) 101/54  Pulse: (!) 39 (!) 37  Resp: 14 20  Temp:    SpO2: 97% 95%    Last Pain:  Vitals:   11/21/16 1356  TempSrc: Temporal      Patients Stated Pain Goal: 3 (35/78/97 8478)  Complications: No apparent anesthesia complications and BP was low upon arrival to Cath Lab recovery, Dr. Ola Spurr notified and Albumin X1 ordered and given, Pt BP better, VSS

## 2016-11-21 NOTE — Anesthesia Postprocedure Evaluation (Signed)
Anesthesia Post Note  Patient: [name]  Procedure(s) Performed: ATRIAL FIBRILLATION ABLATION (N/A )     Anesthesia Post Evaluation  Last Vitals:  Vitals:   11/21/16 1408 11/21/16 1410  BP: (!) 98/56 (!) 101/54  Pulse: (!) 39 (!) 37  Resp: 14 20  Temp:    SpO2: 97% 95%    Last Pain:  Vitals:   11/21/16 1356  TempSrc: Temporal                 Joseluis Alessio,TOM

## 2016-11-21 NOTE — Anesthesia Procedure Notes (Signed)
Date/Time: 11/21/2016 10:26 AM Performed by: Carney Living Pre-anesthesia Checklist: Patient identified, Emergency Drugs available, Suction available, Patient being monitored and Timeout performed Patient Re-evaluated:Patient Re-evaluated prior to induction Oxygen Delivery Method: Circle system utilized Preoxygenation: Pre-oxygenation with 100% oxygen Induction Type: IV induction Ventilation: Mask ventilation without difficulty LMA: LMA inserted LMA Size: 4.0 Number of attempts: 1 Placement Confirmation: positive ETCO2 and breath sounds checked- equal and bilateral Tube secured with: Tape Dental Injury: Teeth and Oropharynx as per pre-operative assessment

## 2016-11-21 NOTE — Anesthesia Postprocedure Evaluation (Signed)
Anesthesia Post Note  Patient: Ronnie Black  Procedure(s) Performed: ATRIAL FIBRILLATION ABLATION (N/A )     Patient location during evaluation: PACU Anesthesia Type: General Level of consciousness: awake and alert Pain management: pain level controlled Vital Signs Assessment: post-procedure vital signs reviewed and stable Respiratory status: spontaneous breathing, nonlabored ventilation and respiratory function stable Cardiovascular status: blood pressure returned to baseline and stable Postop Assessment: no apparent nausea or vomiting Anesthetic complications: no    Last Vitals:  Vitals:   11/21/16 1440 11/21/16 1445  BP: 117/60 117/69  Pulse: (!) 47 (!) 38  Resp: 13 17  Temp:  (!) 36.4 C  SpO2: 100% 96%    Last Pain:  Vitals:   11/21/16 1445  TempSrc: Temporal                 Earnie Bechard,W. EDMOND

## 2016-11-21 NOTE — Anesthesia Preprocedure Evaluation (Deleted)
Anesthesia Evaluation  Patient identified by MRN, date of birth, ID band Patient awake    Reviewed: Allergy & Precautions, NPO status , Patient's Chart, lab work & pertinent test results, reviewed documented beta blocker date and time   Airway Mallampati: II  TM Distance: >3 FB Neck ROM: Full    Dental  (+) Teeth Intact   Pulmonary former smoker,           Cardiovascular      Neuro/Psych    GI/Hepatic   Endo/Other    Renal/GU      Musculoskeletal  (+) Arthritis , Osteoarthritis,    Abdominal   Peds  Hematology   Anesthesia Other Findings   Reproductive/Obstetrics                             Anesthesia Physical Anesthesia Plan  ASA: III  Anesthesia Plan: General   Post-op Pain Management:    Induction: Intravenous  PONV Risk Score and Plan:   Airway Management Planned: LMA  Additional Equipment:   Intra-op Plan:   Post-operative Plan: Extubation in OR  Informed Consent: I have reviewed the patients History and Physical, chart, labs and discussed the procedure including the risks, benefits and alternatives for the proposed anesthesia with the patient or authorized representative who has indicated his/her understanding and acceptance.   Dental advisory given  Plan Discussed with: CRNA, Anesthesiologist and Surgeon  Anesthesia Plan Comments:         Anesthesia Quick Evaluation

## 2016-11-21 NOTE — Progress Notes (Signed)
Site area: 3 right fv sheaths Site Prior to Removal:  Level 0 Pressure Applied For: 25 minutes Manual:   yes Patient Status During Pull:  stable Post Pull Site:  Level  0 Post Pull Instructions Given:  yes Post Pull Pulses Present: palpable Dressing Applied:  Gauze and tegaderm Bedrest begins @ 1440 Comments:  IV saline locked

## 2016-11-21 NOTE — Anesthesia Preprocedure Evaluation (Addendum)
Anesthesia Evaluation  Patient identified by MRN, date of birth, ID band Patient awake    Reviewed: Allergy & Precautions, NPO status , Patient's Chart, lab work & pertinent test results, reviewed documented beta blocker date and time   History of Anesthesia Complications Negative for: history of anesthetic complications  Airway Mallampati: II  TM Distance: >3 FB Neck ROM: Full    Dental  (+) Caps, Dental Advisory Given   Pulmonary former smoker (quit 1977),    breath sounds clear to auscultation       Cardiovascular hypertension, Pt. on medications and Pt. on home beta blockers (-) angina+ dysrhythmias Atrial Fibrillation  Rhythm:Irregular Rate:Normal  10/22/16 ECHO: EF 60-65%, Normal LV function; mild LVH possibly more prominent at the apex; mild MR   Neuro/Psych negative neurological ROS     GI/Hepatic Neg liver ROS, GERD  Controlled,  Endo/Other  negative endocrine ROS  Renal/GU negative Renal ROS     Musculoskeletal  (+) Arthritis ,   Abdominal   Peds  Hematology  (+) Blood dyscrasia (eliquis), ,   Anesthesia Other Findings   Reproductive/Obstetrics                            Anesthesia Physical Anesthesia Plan  ASA: III  Anesthesia Plan: General   Post-op Pain Management:    Induction: Intravenous  PONV Risk Score and Plan: 2 and Ondansetron and Dexamethasone  Airway Management Planned: LMA  Additional Equipment:   Intra-op Plan:   Post-operative Plan:   Informed Consent: I have reviewed the patients History and Physical, chart, labs and discussed the procedure including the risks, benefits and alternatives for the proposed anesthesia with the patient or authorized representative who has indicated his/her understanding and acceptance.   Dental advisory given  Plan Discussed with: CRNA and Surgeon  Anesthesia Plan Comments: (Plan routine monitors, GA- LMA OK)         Anesthesia Quick Evaluation

## 2016-11-21 NOTE — Interval H&P Note (Signed)
History and Physical Interval Note:  11/21/2016 8:39 AM  Ronnie Black  has presented today for surgery, with the diagnosis of afib  The various methods of treatment have been discussed with the patient and family. After consideration of risks, benefits and other options for treatment, the patient has consented to  Procedure(s): ATRIAL FIBRILLATION ABLATION (N/A) as a surgical intervention .  The patient's history has been reviewed, patient examined, no change in status, stable for surgery.  I have reviewed the patient's chart and labs.  Questions were answered to the patient's satisfaction.     [name]

## 2016-11-22 ENCOUNTER — Encounter (HOSPITAL_COMMUNITY): Payer: Self-pay | Admitting: Internal Medicine

## 2016-11-22 ENCOUNTER — Telehealth: Payer: Self-pay | Admitting: *Deleted

## 2016-11-22 DIAGNOSIS — I481 Persistent atrial fibrillation: Secondary | ICD-10-CM | POA: Diagnosis not present

## 2016-11-22 DIAGNOSIS — E785 Hyperlipidemia, unspecified: Secondary | ICD-10-CM | POA: Diagnosis not present

## 2016-11-22 DIAGNOSIS — I422 Other hypertrophic cardiomyopathy: Secondary | ICD-10-CM | POA: Diagnosis not present

## 2016-11-22 DIAGNOSIS — I10 Essential (primary) hypertension: Secondary | ICD-10-CM | POA: Diagnosis not present

## 2016-11-22 NOTE — Progress Notes (Signed)
Patient received discharge instructions with wife at bedside. No new medications started or changed. Peripheral IV removed. Educated on post-care of right groin site (post-ablation). Assessed site, level 0. Denies any pain or discomfort. Patient and wife verbalized understanding. Escorted out via wheelchair by volunteer.

## 2016-11-22 NOTE — Telephone Encounter (Signed)
Pt was on TCM list admitted 11/21/16 for  Hypertrophic cardiomyopathy, Hyperlipidemia, and DDD. Pt had an Electrophysiology study and radiofrequency catheter ablation done, and was D/c 11/22/16. Pt will f/u w/ specialist in couple weeks   [place]

## 2016-11-22 NOTE — Progress Notes (Signed)
Patient was up at 2045 and went to the restroom w/o difficulties.  He reports no pain at surgery site and site is clean w/o bleeding.  Patient is sleeping at this time.

## 2016-11-25 ENCOUNTER — Encounter: Payer: Self-pay | Admitting: Internal Medicine

## 2016-11-26 ENCOUNTER — Encounter: Payer: Self-pay | Admitting: Internal Medicine

## 2016-11-26 NOTE — Addendum Note (Signed)
Addendum  created 11/26/16 1106 by Josephine Igo, MD   Sign clinical note

## 2016-11-27 ENCOUNTER — Telehealth: Payer: Self-pay | Admitting: Internal Medicine

## 2016-11-27 ENCOUNTER — Encounter (HOSPITAL_COMMUNITY): Payer: Self-pay | Admitting: Nurse Practitioner

## 2016-11-27 ENCOUNTER — Ambulatory Visit (HOSPITAL_COMMUNITY)
Admission: RE | Admit: 2016-11-27 | Discharge: 2016-11-27 | Disposition: A | Discharge status: Home / Self Care | Payer: BC Managed Care – PPO | Source: Ambulatory Visit | Attending: Nurse Practitioner | Admitting: Nurse Practitioner

## 2016-11-27 VITALS — BP 152/94 | HR 99 | Ht 66.0 in | Wt 154.0 lb

## 2016-11-27 DIAGNOSIS — R1013 Epigastric pain: Secondary | ICD-10-CM | POA: Diagnosis not present

## 2016-11-27 DIAGNOSIS — I481 Persistent atrial fibrillation: Secondary | ICD-10-CM | POA: Diagnosis not present

## 2016-11-27 DIAGNOSIS — Z87891 Personal history of nicotine dependence: Secondary | ICD-10-CM | POA: Diagnosis not present

## 2016-11-27 DIAGNOSIS — I4892 Unspecified atrial flutter: Secondary | ICD-10-CM | POA: Diagnosis not present

## 2016-11-27 DIAGNOSIS — Z7901 Long term (current) use of anticoagulants: Secondary | ICD-10-CM | POA: Diagnosis not present

## 2016-11-27 DIAGNOSIS — Z79899 Other long term (current) drug therapy: Secondary | ICD-10-CM | POA: Insufficient documentation

## 2016-11-27 DIAGNOSIS — Z85828 Personal history of other malignant neoplasm of skin: Secondary | ICD-10-CM | POA: Insufficient documentation

## 2016-11-27 DIAGNOSIS — M545 Low back pain: Secondary | ICD-10-CM | POA: Insufficient documentation

## 2016-11-27 DIAGNOSIS — I4891 Unspecified atrial fibrillation: Secondary | ICD-10-CM | POA: Diagnosis present

## 2016-11-27 DIAGNOSIS — E785 Hyperlipidemia, unspecified: Secondary | ICD-10-CM | POA: Insufficient documentation

## 2016-11-27 MED ORDER — PANTOPRAZOLE SODIUM 40 MG PO TBEC: 40.0000 mg | DELAYED_RELEASE_TABLET | Freq: Every day | ORAL | 0 refills | Status: DC

## 2016-11-27 NOTE — Progress Notes (Signed)
Patient ID: Ronnie Black, male   DOB: Sep 05, 1945, 71 y.o.   MRN: [id number]     Primary Care Physician: [name], MD Referring Physician: Dr. Ron Parker EP: Dr. Hilliard Clark is a 71 y.o. male with a h/o afib ablation in 2012, cardioversion x 8 in the past, most recent 01/16/16 and   10/10/16, that was initially successful but went back into afib after a few days.  Takes norpace since 2014 with h/o HCM.Usually takes 150 mg q 12 hours, but will increase to 300 mg bid when in afib but this has not been successful to get pt back in rhythm.  He is now ready to discuss with Dr. [name] other options, he believes he would prefer another ablation over change in antiarrythmic.  F/u in afib clinic,11/7 for urgent visit. He had ablation 11/1, almost one week ago and feels that he has been out of rhythm for several days.Continues on Norpace and metoprolol.  No missed doses of eliquis 5 mg bid. He is also c/o of a sore stiff back and some uncomfortable feeling when eating. Back discomfort is worse with standing or sitting for too long. Tylenol has not helped. No burning with urination or fevers, chills. He is tender over lower vertebrate with palpation. He is not on PPI, as usual with ablation pts, for unknown reason.   Today, he denies symptoms of  chest pain, shortness of breath, orthopnea, PND, lower extremity edema, dizziness, presyncope, syncope, or neurologic sequela. Aware of mild palpitations, fatigue, back pain as described above..The patient is tolerating medications without difficulties and is otherwise without complaint today.   Past Medical History:  Diagnosis Date  . DDD (degenerative disc disease)   . HLD (hyperlipidemia)   . 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 Persistent atrial fibrillation ([place])    s/p PVI 3/12   Past Surgical History:  Procedure Laterality Date  . APPENDECTOMY    . atrial fibrillation ablation  03/27/10   PVI with CTI ablation  by JA  . CARDIOVERSION    . SKIN CANCER EXCISION    . WISDOM TOOTH EXTRACTION      Current Outpatient Medications  Medication Sig Dispense Refill  . apixaban (ELIQUIS) 5 MG TABS tablet Take 1 tablet (5 mg total) by mouth 2 (two) times daily. 60 tablet 11  . atorvastatin (LIPITOR) 10 MG tablet Take 1 tablet (10 mg total) by mouth daily. 90 tablet 1  . disopyramide (NORPACE CR) 150 MG 12 hr capsule Take 1-2 tablets twice daily (Patient taking differently: Take 150 mg by mouth 2 (two) times daily. Pt may take additional capsule with dose if needed) 360 capsule 3  . fish oil-omega-3 fatty acids 1000 MG capsule Take 1 g by mouth 3 (three) times a week.     . fluconazole (DIFLUCAN) 0.2 % ophthalmic solution     . metoprolol tartrate (LOPRESSOR) 25 MG tablet Take 1 tablet (25 mg total) by mouth 2 (two) times daily. 60 tablet 11  . sildenafil (VIAGRA) 25 MG tablet Take 25 mg by mouth as needed for erectile dysfunction. Take as directed    . pantoprazole (PROTONIX) 40 MG tablet Take 1 tablet (40 mg total) daily by mouth. 45 tablet 0   No current facility-administered medications for this encounter.     No Known Allergies  Social History   Socioeconomic History  . Marital status: Married    Spouse name: Not on file  .  Number of children: Not on file  . Years of education: Not on file  . Highest education level: Not on file  Social Needs  . Financial resource strain: Not on file  . Food insecurity - worry: Not on file  . Food insecurity - inability: Not on file  . Transportation needs - medical: Not on file  . Transportation needs - non-medical: Not on file  Occupational History  . Occupation: professor    Employer: [employer]  Tobacco Use  . Smoking status: Former Smoker    Packs/day: 1.00    Years: 15.00    Pack years: 15.00    Types: Cigarettes    Last attempt to quit: 11/28/1975    Years since quitting: 41.0  . Smokeless tobacco: Never Used  Substance and Sexual Activity  . Alcohol  use: Yes    Alcohol/week: 3.6 oz    Types: 3 Glasses of wine, 3 Cans of beer per week    Comment: rare wine  . Drug use: No  . Sexual activity: Yes  Other Topics Concern  . Not on file  Social History Narrative    a professor of Therapist, occupational at [employer] in [place].    Family History  Problem Relation Age of Onset  . Coronary artery disease Unknown   . Cancer Mother   . Cancer Father   . Early death Neg Hx   . Hearing loss Neg Hx   . Heart disease Neg Hx   . Hyperlipidemia Neg Hx   . Hypertension Neg Hx   . Kidney disease Neg Hx   . Stroke Neg Hx     ROS- All systems are reviewed and negative except as per the HPI above  Physical Exam: Vitals:   11/27/16 1534  BP: (!) 152/94  Pulse: 99  Weight: 154 lb (69.9 kg)  Height: 5\' 6"  (1.676 m)    GEN- The patient is well appearing, alert and oriented x 3 today.   Head- normocephalic, atraumatic Eyes-  Sclera clear, conjunctiva pink Ears- hearing intact Oropharynx- clear Neck- supple, no JVP Lymph- no cervical lymphadenopathy Lungs- Clear to ausculation bilaterally, normal work of breathing Heart- irregular rate and rhythm, no murmurs, rubs or gallops, PMI not laterally displaced GI- soft, NT, ND, + BS Extremities- no clubbing, cyanosis, or edema MS- no significant deformity or atrophy Skin- no rash or lesion Psych- euthymic mood, full affect Neuro- strength and sensation are intact  EKG- atrial flutter at 99 bpm, qrs int 92 ms, qrs int 92 ms, qtc 431 ms   Assessment and Plan:  1. Persistent  Afib/flutter   Cardioversion 9/20 which was successful but ERAF Pt then chose to have repeat ablation Pt had repeat ablation 11/1 and is now is back in atrial flutter at 99 bpm Continue Eliquis 5 mg bid with chadsvasc score of 2, states no missed doses Continue norpace 150 mg bid Can try to increase metoprolol tartrate to 25 mg 1 1/2 tab bid to see if this will encourage restoration of SR Otherwise will be set  up for cardioversion 11/14   2. Low back discomfort Known DDD and probably 2/2 to lying on OR table for several hours for procedure  Tylenol has not helped Can try  minimal doses of Aleve over the next 3 days, with food, as well as topical preparations, heat and mild stretching  3. Epigastric discomfort associated with eating Will add Protonix 40 mg one a day for 6 weeks as per Dr.  Allred's usual protocol  F/u with afib clinic 7-10 days after cardioversion  Ronnie Black, [facility] [address] [place], [place] [address] [phone]

## 2016-11-27 NOTE — Patient Instructions (Addendum)
Your physician has recommended you make the following change in your medication:  1)Start Protonix 40mg  once a day until finish prescription (start tonight) 2)Aleve once a day for 3 days  Cardioversion scheduled for Wednesday, November 14th  - Arrive at the [facility] and go to admitting at 12:30pm  -Do not eat or drink anything after midnight the night prior to your procedure.  - Take all your medication with a sip of water prior to arrival.  - You will not be able to drive home after your procedure.  -Do not miss any doses of your Eliquis

## 2016-11-27 NOTE — Telephone Encounter (Signed)
New Message     Patient is having a problem with following the ablation he had done

## 2016-11-27 NOTE — Telephone Encounter (Signed)
Spoke with patient regarding post ablation concerns.  Informed patient to contact Afib clinic to further evaluate the issues.

## 2016-12-01 ENCOUNTER — Other Ambulatory Visit: Payer: Self-pay | Admitting: Internal Medicine

## 2016-12-03 ENCOUNTER — Ambulatory Visit (HOSPITAL_COMMUNITY)
Admission: RE | Admit: 2016-12-03 | Discharge: 2016-12-03 | Disposition: A | Discharge status: Home / Self Care | Payer: BC Managed Care – PPO | Source: Ambulatory Visit | Attending: Nurse Practitioner | Admitting: Nurse Practitioner

## 2016-12-03 DIAGNOSIS — I4892 Unspecified atrial flutter: Secondary | ICD-10-CM | POA: Insufficient documentation

## 2016-12-03 NOTE — Progress Notes (Signed)
Pt in for EKG before DCCV tomorrow.  To be reviewed by Roderic Palau, NP

## 2016-12-03 NOTE — H&P (View-Only) (Signed)
Pt in for EKG, scheduled for cardioversion tomorrow, he felt that he was in SR but EKG continues to show atrial flutter with variable rate. He will continue with plans for cardioversion.

## 2016-12-03 NOTE — Progress Notes (Signed)
Pt in for EKG, scheduled for cardioversion tomorrow, he felt that he was in SR but EKG continues to show atrial flutter with variable rate. He will continue with plans for cardioversion.

## 2016-12-04 ENCOUNTER — Ambulatory Visit (HOSPITAL_COMMUNITY): Payer: BC Managed Care – PPO | Admitting: Anesthesiology

## 2016-12-04 ENCOUNTER — Encounter (HOSPITAL_COMMUNITY): Payer: Self-pay

## 2016-12-04 ENCOUNTER — Ambulatory Visit (HOSPITAL_COMMUNITY)
Admission: RE | Admit: 2016-12-04 | Discharge: 2016-12-04 | Disposition: A | Discharge status: Home / Self Care | Payer: BC Managed Care – PPO | Source: Ambulatory Visit | Attending: Cardiology | Admitting: Cardiology

## 2016-12-04 ENCOUNTER — Encounter (HOSPITAL_COMMUNITY)
Admission: RE | Disposition: A | Discharge status: Home / Self Care | Payer: Self-pay | Source: Ambulatory Visit | Attending: Cardiology

## 2016-12-04 DIAGNOSIS — I4892 Unspecified atrial flutter: Secondary | ICD-10-CM | POA: Insufficient documentation

## 2016-12-04 DIAGNOSIS — Z87891 Personal history of nicotine dependence: Secondary | ICD-10-CM | POA: Insufficient documentation

## 2016-12-04 DIAGNOSIS — Z7901 Long term (current) use of anticoagulants: Secondary | ICD-10-CM | POA: Insufficient documentation

## 2016-12-04 DIAGNOSIS — M1990 Unspecified osteoarthritis, unspecified site: Secondary | ICD-10-CM | POA: Insufficient documentation

## 2016-12-04 DIAGNOSIS — Z79899 Other long term (current) drug therapy: Secondary | ICD-10-CM | POA: Insufficient documentation

## 2016-12-04 HISTORY — PX: CARDIOVERSION: SHX1299

## 2016-12-04 SURGERY — CARDIOVERSION
Anesthesia: General

## 2016-12-04 MED ORDER — LIDOCAINE HCL (CARDIAC) 20 MG/ML IV SOLN
INTRAVENOUS | Status: DC | PRN
  Administered 2016-12-04: 60 mg via INTRAVENOUS

## 2016-12-04 MED ORDER — PROPOFOL 10 MG/ML IV BOLUS
INTRAVENOUS | Status: DC | PRN
  Administered 2016-12-04: 50 mg via INTRAVENOUS

## 2016-12-04 NOTE — Anesthesia Postprocedure Evaluation (Signed)
Anesthesia Post Note  Patient: Ronnie Black  Procedure(s) Performed: CARDIOVERSION (N/A )     Patient location during evaluation: PACU Anesthesia Type: General Level of consciousness: awake and alert Pain management: pain level controlled Vital Signs Assessment: post-procedure vital signs reviewed and stable Respiratory status: spontaneous breathing, nonlabored ventilation and respiratory function stable Cardiovascular status: blood pressure returned to baseline and stable Postop Assessment: no apparent nausea or vomiting Anesthetic complications: no    Last Vitals:  Vitals:   12/04/16 1349 12/04/16 1357  BP: 115/78 113/74  Pulse: 70 67  Resp: (!) 21 15  Temp: 36.4 C 36.6 C  SpO2: 99% 100%    Last Pain:  Vitals:   12/04/16 1349  TempSrc: Oral  PainSc:                  Lavaughn Bisig,W. EDMOND

## 2016-12-04 NOTE — Interval H&P Note (Signed)
History and Physical Interval Note:  12/04/2016 1:33 PM  Ronnie Black  has presented today for surgery, with the diagnosis of A-FIB  The various methods of treatment have been discussed with the patient and family. After consideration of risks, benefits and other options for treatment, the patient has consented to  Procedure(s): CARDIOVERSION (N/A) as a surgical intervention .  The patient's history has been reviewed, patient examined, no change in status, stable for surgery.  I have reviewed the patient's chart and labs.  Questions were answered to the patient's satisfaction.     Toren Tucholski [employer]

## 2016-12-04 NOTE — Procedures (Signed)
Electrical Cardioversion Procedure Note DONNY HEFFERN [id number] [birth year]  Procedure: Electrical Cardioversion Indications:  Atrial flutter.   Procedure Details Consent: Risks of procedure as well as the alternatives and risks of each were explained to the (patient/caregiver).  Consent for procedure obtained. Time Out: Verified patient identification, verified procedure, site/side was marked, verified correct patient position, special equipment/implants available, medications/allergies/relevent history reviewed, required imaging and test results available.  Performed  Patient placed on cardiac monitor, pulse oximetry, supplemental oxygen as necessary.  Sedation given: Per anesthesiology Pacer pads placed anterior and posterior chest.  Cardioverted 1 time(s).  Cardioverted at 120J.  Evaluation Findings: Post procedure EKG shows: NSR Complications: None Patient did tolerate procedure well.   Loralie Champagne 12/04/2016, 1:35 PM

## 2016-12-04 NOTE — Anesthesia Preprocedure Evaluation (Addendum)
Anesthesia Evaluation  Patient identified by MRN, date of birth, ID band Patient awake    Reviewed: Allergy & Precautions, H&P , NPO status , Patient's Chart, lab work & pertinent test results  Airway Mallampati: III  TM Distance: >3 FB Neck ROM: Full    Dental no notable dental hx. (+) Teeth Intact, Dental Advisory Given   Pulmonary neg pulmonary ROS, former smoker,    Pulmonary exam normal breath sounds clear to auscultation       Cardiovascular + dysrhythmias Atrial Fibrillation  Rhythm:Irregular Rate:Normal     Neuro/Psych negative neurological ROS  negative psych ROS   GI/Hepatic negative GI ROS, Neg liver ROS,   Endo/Other  negative endocrine ROS  Renal/GU negative Renal ROS  negative genitourinary   Musculoskeletal  (+) Arthritis , Osteoarthritis,    Abdominal   Peds  Hematology negative hematology ROS (+)   Anesthesia Other Findings   Reproductive/Obstetrics negative OB ROS                            Anesthesia Physical Anesthesia Plan  ASA: III  Anesthesia Plan: General   Post-op Pain Management:    Induction: Intravenous  PONV Risk Score and Plan: 2 and Treatment may vary due to age or medical condition  Airway Management Planned: Mask  Additional Equipment:   Intra-op Plan:   Post-operative Plan:   Informed Consent: I have reviewed the patients History and Physical, chart, labs and discussed the procedure including the risks, benefits and alternatives for the proposed anesthesia with the patient or authorized representative who has indicated his/her understanding and acceptance.   Dental advisory given  Plan Discussed with: CRNA  Anesthesia Plan Comments:         Anesthesia Quick Evaluation

## 2016-12-04 NOTE — Transfer of Care (Signed)
Immediate Anesthesia Transfer of Care Note  Patient: Ronnie Black  Procedure(s) Performed: CARDIOVERSION (N/A )  Patient Location: PACU  Anesthesia Type:General  Level of Consciousness: awake, alert  and oriented  Airway & Oxygen Therapy: Patient Spontanous Breathing and Patient connected to nasal cannula oxygen  Post-op Assessment: Report given to RN and Post -op Vital signs reviewed and stable  Post vital signs: Reviewed and stable  Last Vitals:  Vitals:   12/04/16 1253  BP: (!) 141/92  Pulse: 80  Resp: 15  Temp: 36.6 C  SpO2: 97%    Last Pain:  Vitals:   12/04/16 1253  TempSrc: Oral  PainSc: 5          Complications: No apparent anesthesia complications

## 2016-12-05 ENCOUNTER — Encounter (HOSPITAL_COMMUNITY): Payer: Self-pay | Admitting: Cardiology

## 2016-12-09 ENCOUNTER — Other Ambulatory Visit: Payer: Self-pay | Admitting: Internal Medicine

## 2016-12-10 ENCOUNTER — Encounter (HOSPITAL_COMMUNITY): Payer: Self-pay | Admitting: Nurse Practitioner

## 2016-12-10 ENCOUNTER — Ambulatory Visit (HOSPITAL_COMMUNITY)
Admission: RE | Admit: 2016-12-10 | Discharge: 2016-12-10 | Disposition: A | Discharge status: Home / Self Care | Payer: BC Managed Care – PPO | Source: Ambulatory Visit | Attending: Nurse Practitioner | Admitting: Nurse Practitioner

## 2016-12-10 VITALS — BP 136/78 | HR 81 | Ht 66.0 in | Wt 152.0 lb

## 2016-12-10 DIAGNOSIS — Z87891 Personal history of nicotine dependence: Secondary | ICD-10-CM | POA: Diagnosis not present

## 2016-12-10 DIAGNOSIS — Z8249 Family history of ischemic heart disease and other diseases of the circulatory system: Secondary | ICD-10-CM | POA: Diagnosis not present

## 2016-12-10 DIAGNOSIS — Z85828 Personal history of other malignant neoplasm of skin: Secondary | ICD-10-CM | POA: Diagnosis not present

## 2016-12-10 DIAGNOSIS — M545 Low back pain: Secondary | ICD-10-CM | POA: Insufficient documentation

## 2016-12-10 DIAGNOSIS — I481 Persistent atrial fibrillation: Secondary | ICD-10-CM | POA: Insufficient documentation

## 2016-12-10 DIAGNOSIS — Z7902 Long term (current) use of antithrombotics/antiplatelets: Secondary | ICD-10-CM | POA: Insufficient documentation

## 2016-12-10 DIAGNOSIS — I4819 Other persistent atrial fibrillation: Secondary | ICD-10-CM

## 2016-12-10 DIAGNOSIS — Z79899 Other long term (current) drug therapy: Secondary | ICD-10-CM | POA: Diagnosis not present

## 2016-12-10 DIAGNOSIS — Z809 Family history of malignant neoplasm, unspecified: Secondary | ICD-10-CM | POA: Insufficient documentation

## 2016-12-10 DIAGNOSIS — E785 Hyperlipidemia, unspecified: Secondary | ICD-10-CM | POA: Diagnosis not present

## 2016-12-10 DIAGNOSIS — I4892 Unspecified atrial flutter: Secondary | ICD-10-CM | POA: Insufficient documentation

## 2016-12-10 DIAGNOSIS — R1013 Epigastric pain: Secondary | ICD-10-CM | POA: Diagnosis not present

## 2016-12-10 DIAGNOSIS — Z9889 Other specified postprocedural states: Secondary | ICD-10-CM | POA: Insufficient documentation

## 2016-12-10 NOTE — Progress Notes (Signed)
Patient ID: Ronnie Black, male   DOB: Apr 27, 1945, 71 y.o.   MRN: [id number]     Primary Care Physician: [name], MD Referring Physician: Dr. Ron Parker EP: Dr. Hilliard Clark is a 71 y.o. male with a h/o afib ablation in 2012, cardioversion x 8 in the past, most recent 01/16/16 and   10/10/16, that was initially successful but went back into afib after a few days.  Takes norpace since 2014 with h/o HCM.Usually takes 150 mg q 12 hours, but will increase to 300 mg bid when in afib but this has not been successful to get pt back in rhythm.  He is now ready to discuss with Dr. [name] other options, he believes he would prefer another ablation over change in antiarrythmic.  F/u in afib clinic,11/7 for urgent visit. He had ablation 11/1, almost one week ago and feels that he has been out of rhythm for several days.Continues on Norpace and metoprolol.  No missed doses of eliquis 5 mg bid. He is also c/o of a sore stiff back and some uncomfortable feeling when eating. Back discomfort is worse with standing or sitting for too long. Tylenol has not helped. No burning with urination or fevers, chills. He is tender over lower vertebrate with palpation. He is not on PPI, as usual with ablation pts, for unknown reason.   F/u 11/20, he is here to f/u cardioversion one week ago.Unfiortunately it only held for 2 days. He is rate controlled afib and can function out of rhythm but is  more tired than usual.His low back discomfort since procedure is slowly improving.  Today, he denies symptoms of  chest pain, shortness of breath, orthopnea, PND, lower extremity edema, dizziness, presyncope, syncope, or neurologic sequela. Aware of mild palpitations, fatigue, back pain as described above..The patient is tolerating medications without difficulties and is otherwise without complaint today.   Past Medical History:  Diagnosis Date  . DDD (degenerative disc disease)   . HLD (hyperlipidemia)   . 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 Persistent atrial fibrillation ([place])    s/p PVI 3/12   Past Surgical History:  Procedure Laterality Date  . APPENDECTOMY    . atrial fibrillation ablation  03/27/10   PVI with CTI ablation by JA  . ATRIAL FIBRILLATION ABLATION N/A 11/21/2016   Performed by [name], MD at [facility]  . CARDIOVERSION    . CARDIOVERSION N/A 12/04/2016   Performed by Larey Dresser, MD at [place] [facility] [place]  . CARDIOVERSION N/A 10/10/2016   Performed by Larey Dresser, MD at [place]  . CARDIOVERSION N/A 08/26/2014   Performed by Sanda Klein, MD at [place]  . CARDIOVERSION N/A 08/11/2014   Performed by Fay Records, MD at [facility] ENDOSCOPY  . CARDIOVERSION N/A 08/14/2012   Performed by Josue Hector, MD at [facility] ENDOSCOPY  . CARDIOVERSION N/A 07/28/2012   Performed by Lelon Perla, MD at [place]  . CARDIOVERSION N/A 04/28/2012   Performed by Acie Fredrickson Wonda Cheng, MD at [facility] ENDOSCOPY  . CARDIOVERSION N/A 04/26/2011   Performed by Larey Dresser, MD at [place]  . SKIN CANCER EXCISION    . TRANSESOPHAGEAL ECHOCARDIOGRAM (TEE) N/A 04/28/2012   Performed by Acie Fredrickson Wonda Cheng, MD at [place]  . TRANSESOPHAGEAL ECHOCARDIOGRAM (TEE) N/A 04/26/2011   Performed by Larey Dresser, MD at [place]  . WISDOM TOOTH EXTRACTION  Current Outpatient Medications  Medication Sig Dispense Refill  . apixaban (ELIQUIS) 5 MG TABS tablet Take 1 tablet (5 mg total) by mouth 2 (two) times daily. 60 tablet 11  . atorvastatin (LIPITOR) 10 MG tablet Take 1 tablet (10 mg total) by mouth daily. 90 tablet 1  . disopyramide (NORPACE CR) 150 MG 12 hr capsule TAKE 1 TO 2 CAPSULES BY MOUTH TWICE DAILY 360 capsule 3  . fish oil-omega-3 fatty acids 1000 MG capsule Take 1 g by mouth 3 (three) times a week.     . fluconazole (DIFLUCAN) 0.2 % ophthalmic solution     . fluticasone (CUTIVATE) 0.005 % ointment Apply 1 application as needed topically.    .  metoprolol tartrate (LOPRESSOR) 25 MG tablet Take 1 tablet (25 mg total) by mouth 2 (two) times daily. 60 tablet 11  . pantoprazole (PROTONIX) 40 MG tablet Take 1 tablet (40 mg total) daily by mouth. 45 tablet 0  . sildenafil (VIAGRA) 25 MG tablet Take 25 mg by mouth as needed for erectile dysfunction. Take as directed     No current facility-administered medications for this encounter.     No Known Allergies  Social History   Socioeconomic History  . Marital status: Married    Spouse name: Not on file  . Number of children: Not on file  . Years of education: Not on file  . Highest education level: Not on file  Social Needs  . Financial resource strain: Not on file  . Food insecurity - worry: Not on file  . Food insecurity - inability: Not on file  . Transportation needs - medical: Not on file  . Transportation needs - non-medical: Not on file  Occupational History  . Occupation: professor    Employer: [employer]  Tobacco Use  . Smoking status: Former Smoker    Packs/day: 1.00    Years: 15.00    Pack years: 15.00    Types: Cigarettes    Last attempt to quit: 11/28/1975    Years since quitting: 41.0  . Smokeless tobacco: Never Used  Substance and Sexual Activity  . Alcohol use: Yes    Alcohol/week: 3.6 oz    Types: 3 Glasses of wine, 3 Cans of beer per week    Comment: rare wine  . Drug use: No  . Sexual activity: Yes  Other Topics Concern  . Not on file  Social History Narrative    a professor of Therapist, occupational at [employer] in [place].    Family History  Problem Relation Age of Onset  . Coronary artery disease Unknown   . Cancer Mother   . Cancer Father   . Early death Neg Hx   . Hearing loss Neg Hx   . Heart disease Neg Hx   . Hyperlipidemia Neg Hx   . Hypertension Neg Hx   . Kidney disease Neg Hx   . Stroke Neg Hx     ROS- All systems are reviewed and negative except as per the HPI above  Physical Exam: Vitals:   12/10/16 0928  BP: 136/78    Pulse: 81  Weight: 152 lb (68.9 kg)  Height: 5\' 6"  (1.676 m)    GEN- The patient is well appearing, alert and oriented x 3 today.   Head- normocephalic, atraumatic Eyes-  Sclera clear, conjunctiva pink Ears- hearing intact Oropharynx- clear Neck- supple, no JVP Lymph- no cervical lymphadenopathy Lungs- Clear to ausculation bilaterally, normal work of breathing Heart- irregular rate and rhythm,  no murmurs, rubs or gallops, PMI not laterally displaced GI- soft, NT, ND, + BS Extremities- no clubbing, cyanosis, or edema MS- no significant deformity or atrophy Skin- no rash or lesion Psych- euthymic mood, full affect Neuro- strength and sensation are intact  EKG- atrial flutter at 81 bpm, qrs int 92 ms, qtc 432   Assessment and Plan:  1. Persistent  Afib/flutter  S/p ablation 11/1  Cardioversion 11/14 which was successful but ERAF Discussed with Dr. [name]. For now he would give him a couple of weeks to see if he can return to rhythm spontaneously, if not, will wash out norpace and start amiodarone short term thru healing period Continue Eliquis 5 mg bid with chadsvasc score of 2  Continue norpace 150 mg bid, pt will try to increase to 300 mg bid to see if it helps, Dr. [name] has given him the ok to do this in the past Continue metoprolol tartrate to 25 mg 1 1/2 tab bid     2. Low back discomfort Known DDD and probably 2/2 to lying on OR table for several hours for procedure  Tylenol has not helped Can try  minimal doses of Aleve over the next 2- 3 days, with food, as well as topical preparations, heat and mild stretching  3. Epigastric discomfort associated with eating Improving Taking Protonix 40 mg one a day for 6 weeks as per Dr. Jackalyn Lombard usual protocol  F/u with afib clinic 2 weeks  Butch Penny C. Varonica Siharath, [facility] [address] [place], [place] [address] [phone]

## 2016-12-19 ENCOUNTER — Ambulatory Visit (HOSPITAL_COMMUNITY): Payer: BC Managed Care – PPO | Admitting: Nurse Practitioner

## 2017-01-02 ENCOUNTER — Telehealth (HOSPITAL_COMMUNITY): Payer: Self-pay | Admitting: *Deleted

## 2017-01-02 MED ORDER — AMIODARONE HCL 200 MG PO TABS: ORAL_TABLET | ORAL | 3 refills | Status: DC

## 2017-01-02 NOTE — Telephone Encounter (Signed)
Patient continues in persistent afib - discussed with Dr. [name] - recommends stopping norpace today start amiodarone load on Sunday (after 3 day washout of Norpace) Pt will start Amiodarone 200mg  BID for 1 month. Pt verbalized understanding.

## 2017-01-09 ENCOUNTER — Ambulatory Visit (HOSPITAL_COMMUNITY)
Admission: RE | Admit: 2017-01-09 | Discharge: 2017-01-09 | Disposition: A | Discharge status: Home / Self Care | Payer: BC Managed Care – PPO | Source: Ambulatory Visit | Attending: Nurse Practitioner | Admitting: Nurse Practitioner

## 2017-01-09 ENCOUNTER — Encounter (HOSPITAL_COMMUNITY): Payer: Self-pay | Admitting: Nurse Practitioner

## 2017-01-09 DIAGNOSIS — I4892 Unspecified atrial flutter: Secondary | ICD-10-CM | POA: Diagnosis present

## 2017-01-09 DIAGNOSIS — Z9889 Other specified postprocedural states: Secondary | ICD-10-CM | POA: Insufficient documentation

## 2017-01-09 DIAGNOSIS — I4439 Other atrioventricular block: Secondary | ICD-10-CM | POA: Insufficient documentation

## 2017-01-09 DIAGNOSIS — I4581 Long QT syndrome: Secondary | ICD-10-CM | POA: Diagnosis not present

## 2017-01-09 NOTE — Progress Notes (Signed)
Pt in for EKG after starting amiodarone for persistent af/flutter since ablation. He washed out norpace x 3 days and has been on amiodarone almost one week. Ekg shows atrial flutter with variable rate at 76 bpm, qrs int 92 ms, qtc 461 ms. He will continue 200 mg bid of amiodarone and will see back week of 01/27/17 and get set up for cardioversion if has not converted to SR.

## 2017-01-29 ENCOUNTER — Ambulatory Visit (HOSPITAL_COMMUNITY)
Admission: RE | Admit: 2017-01-29 | Discharge: 2017-01-29 | Disposition: A | Discharge status: Home / Self Care | Payer: BC Managed Care – PPO | Source: Ambulatory Visit | Attending: Nurse Practitioner | Admitting: Nurse Practitioner

## 2017-01-29 ENCOUNTER — Encounter (HOSPITAL_COMMUNITY): Payer: Self-pay | Admitting: Nurse Practitioner

## 2017-01-29 VITALS — BP 128/74 | HR 65 | Ht 66.0 in | Wt 153.0 lb

## 2017-01-29 DIAGNOSIS — I481 Persistent atrial fibrillation: Secondary | ICD-10-CM | POA: Diagnosis not present

## 2017-01-29 DIAGNOSIS — I493 Ventricular premature depolarization: Secondary | ICD-10-CM | POA: Insufficient documentation

## 2017-01-29 DIAGNOSIS — I440 Atrioventricular block, first degree: Secondary | ICD-10-CM | POA: Diagnosis not present

## 2017-01-29 DIAGNOSIS — I4819 Other persistent atrial fibrillation: Secondary | ICD-10-CM

## 2017-01-29 DIAGNOSIS — E785 Hyperlipidemia, unspecified: Secondary | ICD-10-CM | POA: Insufficient documentation

## 2017-01-29 DIAGNOSIS — I491 Atrial premature depolarization: Secondary | ICD-10-CM | POA: Insufficient documentation

## 2017-01-29 DIAGNOSIS — I4581 Long QT syndrome: Secondary | ICD-10-CM | POA: Diagnosis not present

## 2017-01-29 DIAGNOSIS — Z9889 Other specified postprocedural states: Secondary | ICD-10-CM | POA: Diagnosis not present

## 2017-01-29 DIAGNOSIS — I4891 Unspecified atrial fibrillation: Secondary | ICD-10-CM | POA: Diagnosis present

## 2017-01-29 DIAGNOSIS — Z7901 Long term (current) use of anticoagulants: Secondary | ICD-10-CM | POA: Diagnosis not present

## 2017-01-29 DIAGNOSIS — Z79899 Other long term (current) drug therapy: Secondary | ICD-10-CM | POA: Diagnosis not present

## 2017-01-29 DIAGNOSIS — Z87891 Personal history of nicotine dependence: Secondary | ICD-10-CM | POA: Diagnosis not present

## 2017-01-29 MED ORDER — AMIODARONE HCL 200 MG PO TABS: 200.0000 mg | ORAL_TABLET | Freq: Every day | ORAL | 3 refills | Status: DC

## 2017-01-29 NOTE — Progress Notes (Signed)
Patient ID: Ronnie Black, male   DOB: [birth year], 72 y.o.   MRN: [id number]     Primary Care Physician: [name], MD Referring Physician: Dr. Ron Parker EP: Dr. 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 male with a h/o afib,cardioversion x 9 in the past. He had afib in  09/2016, that was initially successful but went back into afib after a few days.  Was on norpace since 2014 with h/o HCM.Usually takes 150 mg q 12 hours, but will increase to 300 mg bid when in afib, but this had not been recently successful to get pt back in rhythm.  He had f/u with Dr. [name] and had afib ablation 11/21/16, initial  ablation in 2012. After one week, felt that he has been out of rhythm for several days.Continued on Norpace and metoprolol.  No missed doses of eliquis 5 mg bid. He went on to have cardioversion 12/10/16 but had ERAF.  Discussed with Dr. [name] and the decision was to wash out Norpace and start short term amiodarone. He is back today loading x 3 weeks on 200 mg bid and he is in SR. He feels improved and was able to exercise at the gym today and hjis HR stayed regular and he felt good exercising.  Today, he denies symptoms of  chest pain, shortness of breath, orthopnea, PND, lower extremity edema, dizziness, presyncope, syncope, or neurologic sequela. Aware of mild palpitations, fatigue, back pain as described above..The patient is tolerating medications without difficulties and is otherwise without complaint today.   Past Medical History:  Diagnosis Date  . DDD (degenerative disc disease)   . HLD (hyperlipidemia)   . 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 Persistent atrial fibrillation ([place])    s/p PVI 3/12   Past Surgical History:  Procedure Laterality Date  . APPENDECTOMY    . atrial fibrillation ablation  03/27/10   PVI with CTI ablation by JA  . ATRIAL FIBRILLATION ABLATION N/A 11/21/2016   Procedure: ATRIAL FIBRILLATION ABLATION;  Surgeon: [name], MD;   Location: [place];  Service: Cardiovascular;  Laterality: N/A;  . CARDIOVERSION    . CARDIOVERSION  04/26/2011   Procedure: CARDIOVERSION;  Surgeon: Larey Dresser, MD;  Location: [place];  Service: Cardiovascular;  Laterality: N/A;  . CARDIOVERSION N/A 04/28/2012   Procedure: CARDIOVERSION;  Surgeon: Thayer Headings, MD;  Location: [place];  Service: Cardiovascular;  Laterality: N/A;  . CARDIOVERSION N/A 07/28/2012   Procedure: CARDIOVERSION;  Surgeon: Lelon Perla, MD;  Location: [facility] ENDOSCOPY;  Service: Cardiovascular;  Laterality: N/A;  . CARDIOVERSION N/A 08/14/2012   Procedure: CARDIOVERSION;  Surgeon: Josue Hector, MD;  Location: [place];  Service: Cardiovascular;  Laterality: N/A;  . CARDIOVERSION N/A 08/11/2014   Procedure: CARDIOVERSION;  Surgeon: Fay Records, MD;  Location: [facility] ENDOSCOPY;  Service: Cardiovascular;  Laterality: N/A;  . CARDIOVERSION N/A 08/26/2014   Procedure: CARDIOVERSION;  Surgeon: Sanda Klein, MD;  Location: [facility] ENDOSCOPY;  Service: Cardiovascular;  Laterality: N/A;  . CARDIOVERSION N/A 10/10/2016   Procedure: CARDIOVERSION;  Surgeon: Larey Dresser, MD;  Location: [facility] ENDOSCOPY;  Service: Cardiovascular;  Laterality: N/A;  . CARDIOVERSION N/A 12/04/2016   Procedure: CARDIOVERSION;  Surgeon: Larey Dresser, MD;  Location: [facility] ENDOSCOPY;  Service: Cardiovascular;  Laterality: N/A;  . SKIN CANCER EXCISION    . TEE WITHOUT CARDIOVERSION  04/26/2011   Procedure: TRANSESOPHAGEAL ECHOCARDIOGRAM (TEE);  Surgeon: Larey Dresser, MD;  Location: [facility] ENDOSCOPY;  Service: Cardiovascular;  Laterality: N/A;  to be done at 1330  . TEE WITHOUT CARDIOVERSION N/A 04/28/2012   Procedure: TRANSESOPHAGEAL ECHOCARDIOGRAM (TEE);  Surgeon: Thayer Headings, MD;  Location: [place];  Service: Cardiovascular;  Laterality: N/A;  . WISDOM TOOTH EXTRACTION      Current Outpatient Medications  Medication Sig Dispense Refill  . amiodarone (PACERONE) 200 MG tablet  Take 1 tablet by mouth twice a day for 1 month then decrease to once a day 60 tablet 3  . apixaban (ELIQUIS) 5 MG TABS tablet Take 1 tablet (5 mg total) by mouth 2 (two) times daily. 60 tablet 11  . atorvastatin (LIPITOR) 10 MG tablet Take 1 tablet (10 mg total) by mouth daily. 90 tablet 1  . fish oil-omega-3 fatty acids 1000 MG capsule Take 1 g by mouth 3 (three) times a week.     . fluticasone (CUTIVATE) 0.005 % ointment Apply 1 application as needed topically.    . metoprolol tartrate (LOPRESSOR) 25 MG tablet Take one (1) tablet by mouth twice daily. 180 tablet 3  . sildenafil (VIAGRA) 25 MG tablet Take 25 mg by mouth as needed for erectile dysfunction. Take as directed     No current facility-administered medications for this encounter.     No Known Allergies  Social History   Socioeconomic History  . Marital status: Married    Spouse name: Not on file  . Number of children: Not on file  . Years of education: Not on file  . Highest education level: Not on file  Social Needs  . Financial resource strain: Not on file  . Food insecurity - worry: Not on file  . Food insecurity - inability: Not on file  . Transportation needs - medical: Not on file  . Transportation needs - non-medical: Not on file  Occupational History  . Occupation: professor    Employer: [employer]  Tobacco Use  . Smoking status: Former Smoker    Packs/day: 1.00    Years: 15.00    Pack years: 15.00    Types: Cigarettes    Last attempt to quit: 11/28/1975    Years since quitting: 41.2  . Smokeless tobacco: Never Used  Substance and Sexual Activity  . Alcohol use: Yes    Alcohol/week: 3.6 oz    Types: 3 Glasses of wine, 3 Cans of beer per week    Comment: rare wine  . Drug use: No  . Sexual activity: Yes  Other Topics Concern  . Not on file  Social History Narrative    a professor of Therapist, occupational at [employer] in [place].    Family History  Problem Relation Age of Onset  . Coronary artery  disease Unknown   . Cancer Mother   . Cancer Father   . Early death Neg Hx   . Hearing loss Neg Hx   . Heart disease Neg Hx   . Hyperlipidemia Neg Hx   . Hypertension Neg Hx   . Kidney disease Neg Hx   . Stroke Neg Hx     ROS- All systems are reviewed and negative except as per the HPI above  Physical Exam: Vitals:   01/29/17 0933  BP: 128/74  Pulse: 65  Weight: 153 lb (69.4 kg)  Height: 5\' 6"  (1.676 m)    GEN- The patient is well appearing, alert and oriented x 3 today.   Head- normocephalic, atraumatic Eyes-  Sclera clear, conjunctiva pink Ears- hearing intact  Oropharynx- clear Neck- supple, no JVP Lymph- no cervical lymphadenopathy Lungs- Clear to ausculation bilaterally, normal work of breathing Heart- regular rate and rhythm, no murmurs, rubs or gallops, PMI not laterally displaced GI- soft, NT, ND, + BS Extremities- no clubbing, cyanosis, or edema MS- no significant deformity or atrophy Skin- no rash or lesion Psych- euthymic mood, full affect Neuro- strength and sensation are intact  EKG- Sinus rhythm at 65 bpm, with first degree block, pr int 218 ms, qrs int 96 bpm, qtc 463 ms T wave abnormality( old finding) PVC's Epic records reviewed   Assessment and Plan:  1. Persistent  Afib/flutter  S/p ablation 11/21/16  Cardioversion 11/14 which was successful but ERAF Discussed with Dr. [name], norpace washed out and  started on amiodarone 200 mg bid and has converted pt spontaneously  Continue Eliquis 5 mg bid with chadsvasc score of 2  Continue metoprolol tartrate to 25 mg 1  tab bid   2. Low back discomfort Present  after ablation Resolved  3. Epigastric discomfort Present after ablation Protonix 40 mg one a day   Resolved  F/u with Dr. [name] 2/4  Geroge Baseman. Dorma Altman, [facility] [address] [place], [place] [address] [phone]

## 2017-01-29 NOTE — Patient Instructions (Signed)
Decrease amiodarone to 200mg  once a day on 1/17

## 2017-02-24 ENCOUNTER — Other Ambulatory Visit: Payer: Self-pay | Admitting: *Deleted

## 2017-02-24 ENCOUNTER — Encounter: Payer: Self-pay | Admitting: *Deleted

## 2017-02-24 ENCOUNTER — Ambulatory Visit: Payer: BC Managed Care – PPO | Admitting: Internal Medicine

## 2017-02-24 ENCOUNTER — Encounter: Payer: Self-pay | Admitting: Internal Medicine

## 2017-02-24 VITALS — BP 132/76 | HR 68 | Ht 66.0 in | Wt 155.0 lb

## 2017-02-24 DIAGNOSIS — I4891 Unspecified atrial fibrillation: Secondary | ICD-10-CM | POA: Insufficient documentation

## 2017-02-24 DIAGNOSIS — E785 Hyperlipidemia, unspecified: Secondary | ICD-10-CM

## 2017-02-24 DIAGNOSIS — I4819 Other persistent atrial fibrillation: Secondary | ICD-10-CM

## 2017-02-24 DIAGNOSIS — I481 Persistent atrial fibrillation: Secondary | ICD-10-CM | POA: Diagnosis not present

## 2017-02-24 MED ORDER — ATORVASTATIN CALCIUM 10 MG PO TABS
10.0000 mg | ORAL_TABLET | Freq: Every day | ORAL | 3 refills | Status: DC
Start: 2017-02-24 — End: 2018-05-18

## 2017-02-24 MED ORDER — AMIODARONE HCL 200 MG PO TABS: 100.0000 mg | ORAL_TABLET | Freq: Every day | ORAL | 3 refills | Status: DC

## 2017-02-24 NOTE — Progress Notes (Signed)
PCP: [name], MD    Ronnie Black is a 72 y.o. male who presents today for routine electrophysiology followup.  Since his recent afib ablation, the patient reports doing very well.  He did have ERAF which resolved with amiodarone.   he denies procedure related complications and is pleased with the results of the procedure.  Today, he denies symptoms of palpitations, chest pain, shortness of breath,  lower extremity edema, dizziness, presyncope, or syncope.  The patient is otherwise without complaint today.   Past Medical History:  Diagnosis Date  . DDD (degenerative disc disease)   . HLD (hyperlipidemia)   . 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 Persistent atrial fibrillation ([place])    s/p PVI 3/12   Past Surgical History:  Procedure Laterality Date  . APPENDECTOMY    . atrial fibrillation ablation  03/27/10   PVI with CTI ablation by JA  . ATRIAL FIBRILLATION ABLATION N/A 11/21/2016   Procedure: ATRIAL FIBRILLATION ABLATION;  Surgeon: [name], MD;  Location: [place] [facility];  Service: Cardiovascular;  Laterality: N/A;  . CARDIOVERSION    . CARDIOVERSION  04/26/2011   Procedure: CARDIOVERSION;  Surgeon: Larey Dresser, MD;  Location: [place];  Service: Cardiovascular;  Laterality: N/A;  . CARDIOVERSION N/A 04/28/2012   Procedure: CARDIOVERSION;  Surgeon: Thayer Headings, MD;  Location: [place];  Service: Cardiovascular;  Laterality: N/A;  . CARDIOVERSION N/A 07/28/2012   Procedure: CARDIOVERSION;  Surgeon: Lelon Perla, MD;  Location: [facility] ENDOSCOPY;  Service: Cardiovascular;  Laterality: N/A;  . CARDIOVERSION N/A 08/14/2012   Procedure: CARDIOVERSION;  Surgeon: Josue Hector, MD;  Location: [place];  Service: Cardiovascular;  Laterality: N/A;  . CARDIOVERSION N/A 08/11/2014   Procedure: CARDIOVERSION;  Surgeon: Fay Records, MD;  Location: [facility] ENDOSCOPY;  Service: Cardiovascular;  Laterality: N/A;  . CARDIOVERSION N/A 08/26/2014   Procedure: CARDIOVERSION;  Surgeon: Sanda Klein, MD;  Location: [facility] ENDOSCOPY;  Service: Cardiovascular;  Laterality: N/A;  . CARDIOVERSION N/A 10/10/2016   Procedure: CARDIOVERSION;  Surgeon: Larey Dresser, MD;  Location: [facility] ENDOSCOPY;  Service: Cardiovascular;  Laterality: N/A;  . CARDIOVERSION N/A 12/04/2016   Procedure: CARDIOVERSION;  Surgeon: Larey Dresser, MD;  Location: [facility] ENDOSCOPY;  Service: Cardiovascular;  Laterality: N/A;  . SKIN CANCER EXCISION    . TEE WITHOUT CARDIOVERSION  04/26/2011   Procedure: TRANSESOPHAGEAL ECHOCARDIOGRAM (TEE);  Surgeon: Larey Dresser, MD;  Location: [facility] ENDOSCOPY;  Service: Cardiovascular;  Laterality: N/A;  to be done at 1330  . TEE WITHOUT CARDIOVERSION N/A 04/28/2012   Procedure: TRANSESOPHAGEAL ECHOCARDIOGRAM (TEE);  Surgeon: Thayer Headings, MD;  Location: [place];  Service: Cardiovascular;  Laterality: N/A;  . WISDOM TOOTH EXTRACTION      ROS- all systems are personally reviewed and negatives except as per HPI above  Current Outpatient Medications  Medication Sig Dispense Refill  . amiodarone (PACERONE) 200 MG tablet Take 1 tablet (200 mg total) by mouth daily. 60 tablet 3  . apixaban (ELIQUIS) 5 MG TABS tablet Take 1 tablet (5 mg total) by mouth 2 (two) times daily. 60 tablet 11  . atorvastatin (LIPITOR) 10 MG tablet Take 1 tablet (10 mg total) by mouth daily. 90 tablet 1  . fish oil-omega-3 fatty acids 1000 MG capsule Take 1 g by mouth 3 (three) times a week.     . fluticasone (CUTIVATE) 0.005 % ointment Apply 1 application as needed topically.    . metoprolol tartrate (  LOPRESSOR) 25 MG tablet Take one (1) tablet by mouth twice daily. 180 tablet 3  . MULTIPLE VITAMIN PO Take by mouth. One tablet several times a week    . sildenafil (VIAGRA) 25 MG tablet Take 25 mg by mouth as needed for erectile dysfunction. Take as directed     No current facility-administered medications for this visit.     Physical Exam: Vitals:   02/24/17  1500  BP: 132/76  Pulse: 68  Weight: 155 lb (70.3 kg)  Height: 5\' 6"  (1.676 m)    GEN- The patient is well appearing, alert and oriented x 3 today.   Head- normocephalic, atraumatic Eyes-  Sclera clear, conjunctiva pink Ears- hearing intact Oropharynx- clear Lungs- Clear to ausculation bilaterally, normal work of breathing Heart- Regular rate and rhythm, no murmurs, rubs or gallops, PMI not laterally displaced GI- soft, NT, ND, + BS Extremities- no clubbing, cyanosis, or edema  EKG tracing ordered today is personally reviewed and shows sinus rhythm 68 bpm, PR 196 msec, QRS 100 msec, Qtc 457 msec, diffuse chronic TWI  Assessment and Plan:  1. Persistent atrial fibrillation Doing well s/p ablation chads2vasc score is 1 Continue anticoagulation Reduce amiodarone to 100mg  daily at this time.  Consider stopping amiodarone upon return  2. Hypertrophic CM Stable No change required today  Return to see me in 3 months  [name] MD, [facility] 02/24/2017 3:16 PM

## 2017-02-24 NOTE — Patient Instructions (Addendum)
Medication Instructions:  Your physician has recommended you make the following change in your medication:  1.  Decrease your amiodarone to 100 mg (1/2 tablet) by mouth daily. 2.  I refilled your atorvastatin.  Labwork: None ordered.  Testing/Procedures: None ordered.  Follow-Up: Your physician wants you to follow-up in: 3 months with Dr. [name].  Any Other Special Instructions Will Be Listed Below (If Applicable).  If you need a refill on your cardiac medications before your next appointment, please call your pharmacy.

## 2017-03-26 ENCOUNTER — Telehealth: Payer: Self-pay

## 2017-03-26 NOTE — Telephone Encounter (Signed)
   [facility] Pre-operative Risk Assessment    Request for surgical clearance:  1. What type of surgery is being performed? Colonoscopy   2. When is this surgery scheduled? 05/06/17   3. What type of clearance is required (medical clearance vs. Pharmacy clearance to hold med vs. Both)? Pharmacy Clearance  4. Are there any medications that need to be held prior to surgery and how long? Stop Eliquis   5. Practice name and name of physician performing surgery? [facility]; Dr. Dellis Filbert Medoff   6. What is your office phone and fax number? Phone [phone] and Fax [phone]   7. Anesthesia type (None, local, MAC, general) ? Not listed   Ronnie Black 03/26/2017, 4:09 PM  _________________________________________________________________   (provider comments below)

## 2017-03-27 NOTE — Telephone Encounter (Signed)
Clinical pharmacist to review recommendation on eliquis.

## 2017-03-28 NOTE — Telephone Encounter (Signed)
Chart reviewed by our pharmacist- OK to hold Eliquis 1-2 days pre op as needed.  Kerin Ransom PA-C 03/28/2017 2:58 PM

## 2017-03-28 NOTE — Telephone Encounter (Signed)
Pt takes Eliquis for afib with CHADS2VASc score of 1 (age). Renal function is normal. Ok to hold Eliquis for 1-2 days prior to procedure.

## 2017-04-24 ENCOUNTER — Encounter: Payer: Self-pay | Admitting: Internal Medicine

## 2017-05-05 DIAGNOSIS — Z800 Family history of malignant neoplasm of digestive organs: Secondary | ICD-10-CM | POA: Insufficient documentation

## 2017-05-06 LAB — HM COLONOSCOPY

## 2017-05-15 ENCOUNTER — Encounter: Payer: Self-pay | Admitting: Internal Medicine

## 2017-05-22 ENCOUNTER — Other Ambulatory Visit: Payer: Self-pay | Admitting: Internal Medicine

## 2017-05-22 NOTE — Telephone Encounter (Signed)
Age 72 years WT 70.3kg 02/24/2017 Saw Dr [name] on 02/24/2017 11/21/2016 SrCr 0.88 11/21/2016 Hgb 14.9 HCT 44.4 Refill done for Eliquis 5mg  q 12 hours as requested

## 2017-06-02 ENCOUNTER — Ambulatory Visit: Payer: BC Managed Care – PPO | Admitting: Internal Medicine

## 2017-06-02 ENCOUNTER — Encounter: Payer: Self-pay | Admitting: Internal Medicine

## 2017-06-02 VITALS — BP 130/76 | HR 70 | Ht 66.0 in | Wt 154.0 lb

## 2017-06-02 DIAGNOSIS — I4819 Other persistent atrial fibrillation: Secondary | ICD-10-CM

## 2017-06-02 DIAGNOSIS — I481 Persistent atrial fibrillation: Secondary | ICD-10-CM | POA: Diagnosis not present

## 2017-06-02 DIAGNOSIS — I421 Obstructive hypertrophic cardiomyopathy: Secondary | ICD-10-CM

## 2017-06-02 MED ORDER — METOPROLOL TARTRATE 25 MG PO TABS: ORAL_TABLET | ORAL | 3 refills | Status: DC

## 2017-06-02 NOTE — Progress Notes (Signed)
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  Since last being seen in our clinic, the patient reports doing very well.  Today, he denies symptoms of palpitations, chest pain, shortness of breath,  lower extremity edema, dizziness, presyncope, or syncope.  The patient is otherwise without complaint today.   Past Medical History:  Diagnosis Date  . DDD (degenerative disc disease)   . HLD (hyperlipidemia)   . 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 Persistent atrial fibrillation (Ronnie Black)    s/p PVI 3/12   Past Surgical History:  Procedure Laterality Date  . APPENDECTOMY    . atrial fibrillation ablation  03/27/10   PVI with CTI ablation by JA  . ATRIAL FIBRILLATION ABLATION N/A 11/21/2016   Procedure: ATRIAL FIBRILLATION ABLATION;  Surgeon: [name], MD;  Location: [place] [facility];  Service: Cardiovascular;  Laterality: N/A;  . CARDIOVERSION    . CARDIOVERSION  04/26/2011   Procedure: CARDIOVERSION;  Surgeon: Larey Dresser, MD;  Location: [place];  Service: Cardiovascular;  Laterality: N/A;  . CARDIOVERSION N/A 04/28/2012   Procedure: CARDIOVERSION;  Surgeon: Thayer Headings, MD;  Location: [place];  Service: Cardiovascular;  Laterality: N/A;  . CARDIOVERSION N/A 07/28/2012   Procedure: CARDIOVERSION;  Surgeon: Lelon Perla, MD;  Location: [facility] ENDOSCOPY;  Service: Cardiovascular;  Laterality: N/A;  . CARDIOVERSION N/A 08/14/2012   Procedure: CARDIOVERSION;  Surgeon: Josue Hector, MD;  Location: [place];  Service: Cardiovascular;  Laterality: N/A;  . CARDIOVERSION N/A 08/11/2014   Procedure: CARDIOVERSION;  Surgeon: Fay Records, MD;  Location: [facility] ENDOSCOPY;  Service: Cardiovascular;  Laterality: N/A;  . CARDIOVERSION N/A 08/26/2014   Procedure: CARDIOVERSION;  Surgeon: Sanda Klein, MD;  Location: [facility] ENDOSCOPY;  Service: Cardiovascular;   Laterality: N/A;  . CARDIOVERSION N/A 10/10/2016   Procedure: CARDIOVERSION;  Surgeon: Larey Dresser, MD;  Location: [facility] ENDOSCOPY;  Service: Cardiovascular;  Laterality: N/A;  . CARDIOVERSION N/A 12/04/2016   Procedure: CARDIOVERSION;  Surgeon: Larey Dresser, MD;  Location: [facility] ENDOSCOPY;  Service: Cardiovascular;  Laterality: N/A;  . SKIN CANCER EXCISION    . TEE WITHOUT CARDIOVERSION  04/26/2011   Procedure: TRANSESOPHAGEAL ECHOCARDIOGRAM (TEE);  Surgeon: Larey Dresser, MD;  Location: [facility] ENDOSCOPY;  Service: Cardiovascular;  Laterality: N/A;  to be done at 1330  . TEE WITHOUT CARDIOVERSION N/A 04/28/2012   Procedure: TRANSESOPHAGEAL ECHOCARDIOGRAM (TEE);  Surgeon: Thayer Headings, MD;  Location: [place];  Service: Cardiovascular;  Laterality: N/A;  . WISDOM TOOTH EXTRACTION      ROS- all systems are reviewed and negatives except as per HPI above  Current Outpatient Medications  Medication Sig Dispense Refill  . amiodarone (PACERONE) 200 MG tablet Take 0.5 tablets (100 mg total) by mouth daily. (Patient taking differently: Take 100 mg by mouth every other day. ) 45 tablet 3  . atorvastatin (LIPITOR) 10 MG tablet Take 1 tablet (10 mg total) by mouth daily. 90 tablet 3  . ELIQUIS 5 MG TABS tablet TAKE 1 TABLET BY MOUTH TWICE DAILY 60 tablet 5  . fluticasone (CUTIVATE) 0.005 % ointment Apply 1 application as needed topically.    . metoprolol tartrate (LOPRESSOR) 25 MG tablet Take one (1) tablet by mouth twice daily. 180 tablet 3  . MULTIPLE VITAMIN PO Take by mouth. One tablet several times a week    . sildenafil (VIAGRA)  25 MG tablet Take 25 mg by mouth as needed for erectile dysfunction. Take as directed     No current facility-administered medications for this visit.     Physical Exam: Vitals:   06/02/17 1155  BP: 130/76  Pulse: 70  Weight: 154 lb (69.9 kg)  Height: 5\' 6"  (1.676 m)    GEN- The patient is well appearing, alert and oriented x 3 today.   Head-  normocephalic, atraumatic Eyes-  Sclera clear, conjunctiva pink Ears- hearing intact Oropharynx- clear Lungs- Clear to ausculation bilaterally, normal work of breathing Heart- Regular rate and rhythm, no murmurs, rubs or gallops, PMI not laterally displaced GI- soft, NT, ND, + BS Extremities- no clubbing, cyanosis, or edema  EKG tracing ordered today is personally reviewed and shows sinus rhythm 70 bpm, PR 192 msec, QRS 100 msec, Qtc 453 msec, chronic t wave inversions noted  Assessment and Plan:  1. Persistent afib Doing well s/p repeat ablation chads2vasc score is 1 continue anticoagulation longterm (he will be 73 in 1 year and -->chads2vasc 2) stop amiodarone.  Could consider restarting norpace if needed down the road  2. hypertrophic CM (non obstructive) Stable No change required today  Return in 3 months  [name] MD, [facility] 06/02/2017 12:40 PM

## 2017-06-02 NOTE — Patient Instructions (Addendum)
Medication Instructions:  Your physician has recommended you make the following change in your medication:  1.  Stop amiodarone on July 05, 2017  Labwork: None ordered.  Testing/Procedures: None ordered.  Follow-Up: Your physician wants you to follow-up in: 3 months  with Dr. [name].      Any Other Special Instructions Will Be Listed Below (If Applicable).  If you need a refill on your cardiac medications before your next appointment, please call your pharmacy.

## 2017-09-10 ENCOUNTER — Ambulatory Visit: Payer: BC Managed Care – PPO | Admitting: Internal Medicine

## 2017-09-10 ENCOUNTER — Encounter: Payer: Self-pay | Admitting: Internal Medicine

## 2017-09-10 VITALS — BP 138/80 | HR 75 | Ht 66.0 in | Wt 157.0 lb

## 2017-09-10 DIAGNOSIS — I481 Persistent atrial fibrillation: Secondary | ICD-10-CM

## 2017-09-10 DIAGNOSIS — E785 Hyperlipidemia, unspecified: Secondary | ICD-10-CM | POA: Diagnosis not present

## 2017-09-10 DIAGNOSIS — I421 Obstructive hypertrophic cardiomyopathy: Secondary | ICD-10-CM

## 2017-09-10 DIAGNOSIS — I4819 Other persistent atrial fibrillation: Secondary | ICD-10-CM

## 2017-09-10 NOTE — Patient Instructions (Signed)
Medication Instructions:  Your physician recommends that you continue on your current medications as directed. Please refer to the Current Medication list given to you today.  Labwork: None ordered.  Testing/Procedures: Your physician has recommended that you have a sleep study. This test records several body functions during sleep, including: brain activity, eye movement, oxygen and carbon dioxide blood levels, heart rate and rhythm, breathing rate and rhythm, the flow of air through your mouth and nose, snoring, body muscle movements, and chest and belly movement.  We have sent in a referral for [place] [facility] for a sleep study.  Follow-Up: Your physician wants you to follow-up in: 6 months with Dr [name]. You will receive a reminder letter in the mail two months in advance. If you don't receive a letter, please call our office to schedule the follow-up appointment.   Any Other Special Instructions Will Be Listed Below (If Applicable).     If you need a refill on your cardiac medications before your next appointment, please call your pharmacy.

## 2017-09-10 NOTE — Progress Notes (Signed)
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  Since last being seen in our clinic, the patient reports doing very well.  His wife reports that he snores and has some apneic periods.  He does have some afternoon sleepiness and fatigue.  He is planning to retire in May from [place].  Today, he denies symptoms of palpitations, chest pain, shortness of breath,  lower extremity edema, dizziness, presyncope, or syncope.  The patient is otherwise without complaint today.   Past Medical History:  Diagnosis Date  . DDD (degenerative disc disease)   . HLD (hyperlipidemia)   . 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 Persistent atrial fibrillation ([place])    s/p PVI 3/12   Past Surgical History:  Procedure Laterality Date  . APPENDECTOMY    . atrial fibrillation ablation  03/27/10   PVI with CTI ablation by JA  . ATRIAL FIBRILLATION ABLATION N/A 11/21/2016   Procedure: ATRIAL FIBRILLATION ABLATION;  Surgeon: [name], MD;  Location: [place] [facility];  Service: Cardiovascular;  Laterality: N/A;  . CARDIOVERSION    . CARDIOVERSION  04/26/2011   Procedure: CARDIOVERSION;  Surgeon: Larey Dresser, MD;  Location: [place];  Service: Cardiovascular;  Laterality: N/A;  . CARDIOVERSION N/A 04/28/2012   Procedure: CARDIOVERSION;  Surgeon: Thayer Headings, MD;  Location: [place];  Service: Cardiovascular;  Laterality: N/A;  . CARDIOVERSION N/A 07/28/2012   Procedure: CARDIOVERSION;  Surgeon: Lelon Perla, MD;  Location: [facility] ENDOSCOPY;  Service: Cardiovascular;  Laterality: N/A;  . CARDIOVERSION N/A 08/14/2012   Procedure: CARDIOVERSION;  Surgeon: Josue Hector, MD;  Location: [place];  Service: Cardiovascular;  Laterality: N/A;  . CARDIOVERSION N/A 08/11/2014   Procedure: CARDIOVERSION;  Surgeon: Fay Records, MD;  Location: [facility] ENDOSCOPY;  Service: Cardiovascular;   Laterality: N/A;  . CARDIOVERSION N/A 08/26/2014   Procedure: CARDIOVERSION;  Surgeon: Sanda Klein, MD;  Location: [facility] ENDOSCOPY;  Service: Cardiovascular;  Laterality: N/A;  . CARDIOVERSION N/A 10/10/2016   Procedure: CARDIOVERSION;  Surgeon: Larey Dresser, MD;  Location: [facility] ENDOSCOPY;  Service: Cardiovascular;  Laterality: N/A;  . CARDIOVERSION N/A 12/04/2016   Procedure: CARDIOVERSION;  Surgeon: Larey Dresser, MD;  Location: [facility] ENDOSCOPY;  Service: Cardiovascular;  Laterality: N/A;  . SKIN CANCER EXCISION    . TEE WITHOUT CARDIOVERSION  04/26/2011   Procedure: TRANSESOPHAGEAL ECHOCARDIOGRAM (TEE);  Surgeon: Larey Dresser, MD;  Location: [facility] ENDOSCOPY;  Service: Cardiovascular;  Laterality: N/A;  to be done at 1330  . TEE WITHOUT CARDIOVERSION N/A 04/28/2012   Procedure: TRANSESOPHAGEAL ECHOCARDIOGRAM (TEE);  Surgeon: Thayer Headings, MD;  Location: [place];  Service: Cardiovascular;  Laterality: N/A;  . WISDOM TOOTH EXTRACTION      ROS- all systems are reviewed and negatives except as per HPI above  Current Outpatient Medications  Medication Sig Dispense Refill  . atorvastatin (LIPITOR) 10 MG tablet Take 1 tablet (10 mg total) by mouth daily. 90 tablet 3  . ELIQUIS 5 MG TABS tablet TAKE 1 TABLET BY MOUTH TWICE DAILY 60 tablet 5  . fluticasone (CUTIVATE) 0.005 % ointment Apply 1 application as needed topically.    . metoprolol tartrate (LOPRESSOR) 25 MG tablet Take one (1) tablet by mouth twice daily. 180 tablet 3  . MULTIPLE VITAMIN PO Take by mouth. One tablet several times a week    . sildenafil (VIAGRA)  25 MG tablet Take 25 mg by mouth as needed for erectile dysfunction. Take as directed     No current facility-administered medications for this visit.     Physical Exam: Vitals:   09/10/17 1610  BP: 138/80  Pulse: 75  SpO2: 96%  Weight: 157 lb (71.2 kg)  Height: 5\' 6"  (1.676 m)    GEN- The patient is well appearing, alert and oriented x 3 today.   Head-  normocephalic, atraumatic Eyes-  Sclera clear, conjunctiva pink Ears- hearing intact Oropharynx- clear Lungs- Clear to ausculation bilaterally, normal work of breathing Heart- Regular rate and rhythm, no murmurs, rubs or gallops, PMI not laterally displaced GI- soft, NT, ND, + BS Extremities- no clubbing, cyanosis, or edema  Wt Readings from Last 3 Encounters:  09/10/17 157 lb (71.2 kg)  06/02/17 154 lb (69.9 kg)  02/24/17 155 lb (70.3 kg)    EKG tracing ordered today is personally reviewed and shows sinus rhythm with PACs  Assessment and Plan:  1. Persistent afib Doing well s/p ablation, now off Amiodarone chads2vasc score is 1 (will be 2 in May).  He is on eliquis  2. Hypertrophic CM (nonobstructive) Stable No change required today  3. Snoring/ apnea I have advised sleep study to further evaluate  Return in 6 months  [name] MD, [facility] 09/10/2017 4:34 PM

## 2017-10-23 ENCOUNTER — Encounter: Payer: Self-pay | Admitting: Pulmonary Disease

## 2017-10-23 ENCOUNTER — Ambulatory Visit (INDEPENDENT_AMBULATORY_CARE_PROVIDER_SITE_OTHER): Payer: BC Managed Care – PPO | Admitting: Pulmonary Disease

## 2017-10-23 VITALS — BP 126/82 | HR 83 | Ht 66.0 in | Wt 155.0 lb

## 2017-10-23 DIAGNOSIS — I480 Paroxysmal atrial fibrillation: Secondary | ICD-10-CM

## 2017-10-23 DIAGNOSIS — G4752 REM sleep behavior disorder: Secondary | ICD-10-CM | POA: Diagnosis not present

## 2017-10-23 NOTE — Patient Instructions (Signed)
History of abnormal movement at night  Possible sleep disordered breathing/abnormal breathing pattern at night  An in lab study will be most appropriate  As discussed, one night study may not completely reflect what happens on a nightly basis,but, it gives [name] a good idea about what happens on the most nights   I will see you back in the office in about 3 months

## 2017-10-23 NOTE — Progress Notes (Signed)
Ronnie Black    [id number]    [birth year]  Primary Care Physician:Jones, Arvid Right, MD  Referring Physician: [name], MD [address] [place], [place] [address]  Chief complaint:   Spouse has noticed abnormal breathing motions Possible movement disorder during sleep  HPI:  Infrequent snoring strain sounds at night, more jumpy during sleep He has a history of atrial fibrillation for which he had an ablation in 2018 Sleep quality and snoring is better with elevation of the head of the bed He does not believe he has significant sleep disordered breathing-he has occasional snoring He is usually very active He tries to go to bed between 10 and 11 PM, takes him about 5 to 10 minutes to fall asleep, wakes up about once or twice during the night, tries to get out of bed at 6 AM   Occupation: No pertinent occupational history Exposures: No significant exposures Smoking history: Never smoked  Outpatient Encounter Medications as of 10/23/2017  Medication Sig  . atorvastatin (LIPITOR) 10 MG tablet Take 1 tablet (10 mg total) by mouth daily.  Marland Kitchen ELIQUIS 5 MG TABS tablet TAKE 1 TABLET BY MOUTH TWICE DAILY  . fluticasone (CUTIVATE) 0.005 % ointment Apply 1 application as needed topically.  . metoprolol tartrate (LOPRESSOR) 25 MG tablet Take one (1) tablet by mouth twice daily.  . MULTIPLE VITAMIN PO Take by mouth. One tablet several times a week  . sildenafil (VIAGRA) 25 MG tablet Take 25 mg by mouth as needed for erectile dysfunction. Take as directed   No facility-administered encounter medications on file as of 10/23/2017.     Allergies as of 10/23/2017  . (No Known Allergies)    Past Medical History:  Diagnosis Date  . DDD (degenerative disc disease)   . HLD (hyperlipidemia)   . 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 Persistent atrial fibrillation    s/p PVI 3/12    Past Surgical History:  Procedure Laterality Date  .  APPENDECTOMY    . atrial fibrillation ablation  03/27/10   PVI with CTI ablation by JA  . ATRIAL FIBRILLATION ABLATION N/A 11/21/2016   Procedure: ATRIAL FIBRILLATION ABLATION;  Surgeon: [name], MD;  Location: [place] [facility];  Service: Cardiovascular;  Laterality: N/A;  . CARDIOVERSION    . CARDIOVERSION  04/26/2011   Procedure: CARDIOVERSION;  Surgeon: Larey Dresser, MD;  Location: [place];  Service: Cardiovascular;  Laterality: N/A;  . CARDIOVERSION N/A 04/28/2012   Procedure: CARDIOVERSION;  Surgeon: Thayer Headings, MD;  Location: [place];  Service: Cardiovascular;  Laterality: N/A;  . CARDIOVERSION N/A 07/28/2012   Procedure: CARDIOVERSION;  Surgeon: Lelon Perla, MD;  Location: [facility] ENDOSCOPY;  Service: Cardiovascular;  Laterality: N/A;  . CARDIOVERSION N/A 08/14/2012   Procedure: CARDIOVERSION;  Surgeon: Josue Hector, MD;  Location: [facility] ENDOSCOPY;  Service: Cardiovascular;  Laterality: N/A;  . CARDIOVERSION N/A 08/11/2014   Procedure: CARDIOVERSION;  Surgeon: Fay Records, MD;  Location: [facility] ENDOSCOPY;  Service: Cardiovascular;  Laterality: N/A;  . CARDIOVERSION N/A 08/26/2014   Procedure: CARDIOVERSION;  Surgeon: Sanda Klein, MD;  Location: [facility] ENDOSCOPY;  Service: Cardiovascular;  Laterality: N/A;  . CARDIOVERSION N/A 10/10/2016   Procedure: CARDIOVERSION;  Surgeon: Larey Dresser, MD;  Location: [facility] ENDOSCOPY;  Service: Cardiovascular;  Laterality: N/A;  . CARDIOVERSION N/A 12/04/2016   Procedure: CARDIOVERSION;  Surgeon: Larey Dresser, MD;  Location: [facility] ENDOSCOPY;  Service: Cardiovascular;  Laterality: N/A;  . SKIN CANCER EXCISION    . TEE WITHOUT CARDIOVERSION  04/26/2011   Procedure: TRANSESOPHAGEAL ECHOCARDIOGRAM (TEE);  Surgeon: Larey Dresser, MD;  Location: [facility] ENDOSCOPY;  Service: Cardiovascular;  Laterality: N/A;  to be done at 1330  . TEE WITHOUT CARDIOVERSION N/A 04/28/2012   Procedure: TRANSESOPHAGEAL ECHOCARDIOGRAM (TEE);  Surgeon: Thayer Headings, MD;   Location: [facility] ENDOSCOPY;  Service: Cardiovascular;  Laterality: N/A;  . WISDOM TOOTH EXTRACTION      Family History  Problem Relation Age of Onset  . Coronary artery disease Unknown   . Colon cancer Mother   . Lung cancer Mother   . Colon cancer Father   . Early death Neg Hx   . Hearing loss Neg Hx   . Heart disease Neg Hx   . Hyperlipidemia Neg Hx   . Hypertension Neg Hx   . Kidney disease Neg Hx   . Stroke Neg Hx     Social History   Socioeconomic History  . Marital status: Married    Spouse name: Not on file  . Number of children: Not on file  . Years of education: Not on file  . Highest education level: Not on file  Occupational History  . Occupation: professor    Employer: [employer]  . Financial resource strain: Not on file  . Food insecurity:    Worry: Not on file    Inability: Not on file  . Transportation needs:    Medical: Not on file    Non-medical: Not on file  Tobacco Use  . Smoking status: Former Smoker    Packs/day: 1.00    Years: 15.00    Pack years: 15.00    Types: Cigarettes    Last attempt to quit: 11/28/1975    Years since quitting: 41.9  . Smokeless tobacco: Never Used  Substance and Sexual Activity  . Alcohol use: Yes    Alcohol/week: 6.0 standard drinks    Types: 3 Glasses of wine, 3 Cans of beer per week    Comment: rare wine  . Drug use: No  . Sexual activity: Yes  Lifestyle  . Physical activity:    Days per week: Not on file    Minutes per session: Not on file  . Stress: Not on file  Relationships  . Social connections:    Talks on phone: Not on file    Gets together: Not on file    Attends religious service: Not on file    Active member of club or organization: Not on file    Attends meetings of clubs or organizations: Not on file    Relationship status: Not on file  . Intimate partner violence:    Fear of current or ex partner: Not on file    Emotionally abused: Not on file    Physically abused: Not on  file    Forced sexual activity: Not on file  Other Topics Concern  . Not on file  Social History Narrative    a professor of Therapist, occupational at [employer] in [place].    Review of Systems  Constitutional: Negative.   HENT: Negative.   Cardiovascular: Negative.   Gastrointestinal: Negative.   Musculoskeletal: Negative.   Psychiatric/Behavioral: Positive for sleep disturbance.    Vitals:   10/23/17 1056  BP: 126/82  Pulse: 83  SpO2: 96%     Physical Exam  Constitutional: He is oriented to person, place, and time.  He appears well-developed and well-nourished.  HENT:  Head: Normocephalic and atraumatic.  Mallampati 1  Eyes: Pupils are equal, round, and reactive to light. Conjunctivae and EOM are normal. Right eye exhibits no discharge. Left eye exhibits no discharge.  Neck: Normal range of motion. Neck supple. No tracheal deviation present. No thyromegaly present.  Cardiovascular: Normal rate and regular rhythm.  Pulmonary/Chest: Effort normal and breath sounds normal. No respiratory distress.  Abdominal: Soft. Bowel sounds are normal. He exhibits no distension.  Musculoskeletal: Normal range of motion. He exhibits no edema.  Neurological: He is alert and oriented to person, place, and time. He has normal reflexes. No cranial nerve deficit.  Skin: Skin is warm and dry.   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 Abnormal breathing pattern, jumpy, a lot of movement during his sleep  He may have a disturbance with atonia during his left REM sleep  Plan/Recommendations:  An in lab study will be most appropriate to rule out significant sleep disordered breathing and also with video monitoring we should  be able to identify disturbances during his REM sleep--this will not be possible with a home sleep study  His risk for significant sleep disordered breathing is low to moderate at best  Pathophysiology of sleep disordered breathing discussed  Treatment options for sleep disordered breathing discussed  Meant for REM behavior disorder discussed  Interaction between sleep disordered breathing and atrial fibrillation discussed    Sherrilyn Rist MD [facility] 10/23/2017, 11:28 AM  CC: [name], MD

## 2017-11-07 ENCOUNTER — Telehealth (HOSPITAL_COMMUNITY): Payer: Self-pay | Admitting: *Deleted

## 2017-11-07 NOTE — Telephone Encounter (Signed)
Pt cld reporting in and out of rhythm since this morning.  Felt a bit yesterday but not like it has been today.  HRs between 90 and 120s.  Pt currently taking metop 25 bid and was advised to check BPs and if normal take 37.5 mg bid.  Pt currently off norpace.   Pt will see Butch Penny Monday afternoon.  Pt expressed understanding of taking extra metoprolol for High HRs as long as systolic BP is over 741

## 2017-11-10 ENCOUNTER — Ambulatory Visit (HOSPITAL_COMMUNITY)
Admission: RE | Admit: 2017-11-10 | Discharge: 2017-11-10 | Disposition: A | Discharge status: Home / Self Care | Payer: BC Managed Care – PPO | Source: Ambulatory Visit | Attending: Nurse Practitioner | Admitting: Nurse Practitioner

## 2017-11-10 ENCOUNTER — Encounter (HOSPITAL_COMMUNITY): Payer: Self-pay | Admitting: Nurse Practitioner

## 2017-11-10 VITALS — BP 128/82 | HR 93 | Ht 66.0 in | Wt 157.0 lb

## 2017-11-10 DIAGNOSIS — Z7901 Long term (current) use of anticoagulants: Secondary | ICD-10-CM | POA: Insufficient documentation

## 2017-11-10 DIAGNOSIS — Z87891 Personal history of nicotine dependence: Secondary | ICD-10-CM | POA: Insufficient documentation

## 2017-11-10 DIAGNOSIS — I4892 Unspecified atrial flutter: Secondary | ICD-10-CM | POA: Diagnosis present

## 2017-11-10 DIAGNOSIS — Z8249 Family history of ischemic heart disease and other diseases of the circulatory system: Secondary | ICD-10-CM | POA: Insufficient documentation

## 2017-11-10 DIAGNOSIS — Z79899 Other long term (current) drug therapy: Secondary | ICD-10-CM | POA: Insufficient documentation

## 2017-11-10 DIAGNOSIS — E785 Hyperlipidemia, unspecified: Secondary | ICD-10-CM | POA: Diagnosis not present

## 2017-11-10 DIAGNOSIS — Z85828 Personal history of other malignant neoplasm of skin: Secondary | ICD-10-CM | POA: Diagnosis not present

## 2017-11-10 DIAGNOSIS — Z791 Long term (current) use of non-steroidal anti-inflammatories (NSAID): Secondary | ICD-10-CM | POA: Insufficient documentation

## 2017-11-10 LAB — BASIC METABOLIC PANEL
Anion gap: 7 (ref 5–15)
BUN: 10 mg/dL (ref 8–23)
CO2: 30 mmol/L (ref 22–32)
Calcium: 9.3 mg/dL (ref 8.9–10.3)
Chloride: 100 mmol/L (ref 98–111)
Creatinine, Ser: 0.94 mg/dL (ref 0.61–1.24)
GFR calc Af Amer: >60 mL/min (ref >60)
GFR calc non Af Amer: >60 mL/min (ref >60)
Glucose, Bld: 101 mg/dL — ABNORMAL HIGH (ref 70–99)
Potassium: 4.1 mmol/L (ref 3.5–5.1)
Sodium: 137 mmol/L (ref 135–145)

## 2017-11-10 LAB — CBC
HCT: 48.2 % (ref 39.0–52.0)
Hemoglobin: 15.8 g/dL (ref 13.0–17.0)
MCH: 28.1 pg (ref 26.0–34.0)
MCHC: 32.8 g/dL (ref 30.0–36.0)
MCV: 85.8 fL (ref 80.0–100.0)
Platelets: 226 10*3/uL (ref 150–400)
RBC: 5.62 MIL/uL (ref 4.22–5.81)
RDW: 11.9 % (ref 11.5–15.5)
WBC: 6 10*3/uL (ref 4.0–10.5)
nRBC: 0 % (ref 0.0–0.2)

## 2017-11-10 MED ORDER — METOPROLOL TARTRATE 25 MG PO TABS: 37.5000 mg | ORAL_TABLET | Freq: Two times a day (BID) | ORAL | 3 refills | Status: DC

## 2017-11-10 NOTE — Progress Notes (Signed)
Patient ID: RACHEL SAMPLES, male   DOB: 28-Jan-1945, 72 y.o.   MRN: [id number]     Primary Care Physician: [name], MD Referring Physician: Dr. Ron Parker EP: Dr. 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 male with a h/o afib, frequent cardioversions in  the past. He has had afib/flutter ablation in 2012  and afib ablation fall of 2018. He was on norpace at one time, changed to amiodarone which was stopped shortly after last ablation.  He has done well the majority of this year but started noticing some elevation of heart rate over the last several weeks and asked to be seen. EKG shows atrial flutter today with CVR. He does not feel bad with this but follows his HR closely and noticed the elevated HR's.   Today, he denies symptoms of  chest pain, shortness of breath, orthopnea, PND, lower extremity edema, dizziness, presyncope, syncope, or neurologic sequela.  .The patient is tolerating medications without difficulties and is otherwise without complaint today.   Past Medical History:  Diagnosis Date  . DDD (degenerative disc disease)   . HLD (hyperlipidemia)   . 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 Persistent atrial fibrillation    s/p PVI 3/12   Past Surgical History:  Procedure Laterality Date  . APPENDECTOMY    . atrial fibrillation ablation  03/27/10   PVI with CTI ablation by JA  . ATRIAL FIBRILLATION ABLATION N/A 11/21/2016   Procedure: ATRIAL FIBRILLATION ABLATION;  Surgeon: [name], MD;  Location: [place] [facility];  Service: Cardiovascular;  Laterality: N/A;  . CARDIOVERSION    . CARDIOVERSION  04/26/2011   Procedure: CARDIOVERSION;  Surgeon: Larey Dresser, MD;  Location: [place];  Service: Cardiovascular;  Laterality: N/A;  . CARDIOVERSION N/A 04/28/2012   Procedure: CARDIOVERSION;  Surgeon: Thayer Headings, MD;  Location: [place];  Service: Cardiovascular;  Laterality: N/A;  . CARDIOVERSION N/A 07/28/2012   Procedure: CARDIOVERSION;   Surgeon: Lelon Perla, MD;  Location: [facility] ENDOSCOPY;  Service: Cardiovascular;  Laterality: N/A;  . CARDIOVERSION N/A 08/14/2012   Procedure: CARDIOVERSION;  Surgeon: Josue Hector, MD;  Location: [place];  Service: Cardiovascular;  Laterality: N/A;  . CARDIOVERSION N/A 08/11/2014   Procedure: CARDIOVERSION;  Surgeon: Fay Records, MD;  Location: [facility] ENDOSCOPY;  Service: Cardiovascular;  Laterality: N/A;  . CARDIOVERSION N/A 08/26/2014   Procedure: CARDIOVERSION;  Surgeon: Sanda Klein, MD;  Location: [facility] ENDOSCOPY;  Service: Cardiovascular;  Laterality: N/A;  . CARDIOVERSION N/A 10/10/2016   Procedure: CARDIOVERSION;  Surgeon: Larey Dresser, MD;  Location: [facility] ENDOSCOPY;  Service: Cardiovascular;  Laterality: N/A;  . CARDIOVERSION N/A 12/04/2016   Procedure: CARDIOVERSION;  Surgeon: Larey Dresser, MD;  Location: [facility] ENDOSCOPY;  Service: Cardiovascular;  Laterality: N/A;  . SKIN CANCER EXCISION    . TEE WITHOUT CARDIOVERSION  04/26/2011   Procedure: TRANSESOPHAGEAL ECHOCARDIOGRAM (TEE);  Surgeon: Larey Dresser, MD;  Location: [facility] ENDOSCOPY;  Service: Cardiovascular;  Laterality: N/A;  to be done at 1330  . TEE WITHOUT CARDIOVERSION N/A 04/28/2012   Procedure: TRANSESOPHAGEAL ECHOCARDIOGRAM (TEE);  Surgeon: Thayer Headings, MD;  Location: [place];  Service: Cardiovascular;  Laterality: N/A;  . WISDOM TOOTH EXTRACTION      Current Outpatient Medications  Medication Sig Dispense Refill  . atorvastatin (LIPITOR) 10 MG tablet Take 1 tablet (10 mg total) by mouth daily. (Patient taking differently: Take 10 mg by mouth at bedtime. ) 90 tablet 3  .  ELIQUIS 5 MG TABS tablet TAKE 1 TABLET BY MOUTH TWICE DAILY (Patient taking differently: Take 5 mg by mouth 2 (two) times daily. ) 60 tablet 5  . fluticasone (CUTIVATE) 0.005 % ointment Apply 1 application topically 2 (two) times daily as needed (for skin rash/irritation.).     Marland Kitchen metoprolol tartrate (LOPRESSOR) 25 MG tablet Take 1.5 tablets (37.5  mg total) by mouth 2 (two) times daily. Take one (1) tablet by mouth twice daily. 90 tablet 3  . sildenafil (VIAGRA) 25 MG tablet Take 25 mg by mouth as needed for erectile dysfunction. Take as directed    . ibuprofen (ADVIL,MOTRIN) 200 MG tablet Take 200-400 mg by mouth every 6 (six) hours as needed (for pain.).    Marland Kitchen Multiple Vitamin (MULTIVITAMIN WITH MINERALS) TABS tablet Take 1 tablet by mouth 3 (three) times a week.     No current facility-administered medications for this encounter.     No Known Allergies  Social History   Socioeconomic History  . Marital status: Married    Spouse name: Not on file  . Number of children: Not on file  . Years of education: Not on file  . Highest education level: Not on file  Occupational History  . Occupation: professor    Employer: [employer]  . Financial resource strain: Not on file  . Food insecurity:    Worry: Not on file    Inability: Not on file  . Transportation needs:    Medical: Not on file    Non-medical: Not on file  Tobacco Use  . Smoking status: Former Smoker    Packs/day: 1.00    Years: 15.00    Pack years: 15.00    Types: Cigarettes    Last attempt to quit: 11/28/1975    Years since quitting: 41.9  . Smokeless tobacco: Never Used  Substance and Sexual Activity  . Alcohol use: Yes    Alcohol/week: 6.0 standard drinks    Types: 3 Glasses of wine, 3 Cans of beer per week    Comment: rare wine  . Drug use: No  . Sexual activity: Yes  Lifestyle  . Physical activity:    Days per week: Not on file    Minutes per session: Not on file  . Stress: Not on file  Relationships  . Social connections:    Talks on phone: Not on file    Gets together: Not on file    Attends religious service: Not on file    Active member of club or organization: Not on file    Attends meetings of clubs or organizations: Not on file    Relationship status: Not on file  . Intimate partner violence:    Fear of current or ex  partner: Not on file    Emotionally abused: Not on file    Physically abused: Not on file    Forced sexual activity: Not on file  Other Topics Concern  . Not on file  Social History Narrative    a professor of Therapist, occupational at [employer] in [place].    Family History  Problem Relation Age of Onset  . Coronary artery disease Unknown   . Colon cancer Mother   . Lung cancer Mother   . Colon cancer Father   . Early death Neg Hx   . Hearing loss Neg Hx   . Heart disease Neg Hx   . Hyperlipidemia Neg Hx   . Hypertension Neg Hx   .  Kidney disease Neg Hx   . Stroke Neg Hx     ROS- All systems are reviewed and negative except as per the HPI above  Physical Exam: Vitals:   11/10/17 1434  BP: 128/82  Pulse: 93  Weight: 71.2 kg  Height: 5\' 6"  (1.676 m)    GEN- The patient is well appearing, alert and oriented x 3 today.   Head- normocephalic, atraumatic Eyes-  Sclera clear, conjunctiva pink Ears- hearing intact Oropharynx- clear Neck- supple, no JVP Lymph- no cervical lymphadenopathy Lungs- Clear to ausculation bilaterally, normal work of breathing Heart- irregular rate and rhythm, no murmurs, rubs or gallops, PMI not laterally displaced GI- soft, NT, ND, + BS Extremities- no clubbing, cyanosis, or edema MS- no significant deformity or atrophy Skin- no rash or lesion Psych- euthymic mood, full affect Neuro- strength and sensation are intact  EKG- atrial flutter( ? Typical ) at 93 bpm   Assessment and Plan:  1. Persistent flutter  S/p ablation 03/2010 and 11/21/16 Discussed with Dr. [name] and will cardiovert pt Has been on norpace and amiodarone in past Continue Eliquis 5 mg bid with chadsvasc score of 2, no missed doses Continue metoprolol tartrate to 25 mg 1 1/2 tab bid, recently increased for elevated HR's, he will go back to 25 mg bid at time of cardioversion Cbc/bmet today  F/u with Dr. [name]  2 weeks after cardioversion  Butch Penny C. Lacoya Wilbanks,  [facility] [address] [place], [place] [address] [phone]

## 2017-11-10 NOTE — Patient Instructions (Signed)
Cardioversion scheduled for Friday, October 25th  - Arrive at the [facility] and go to admitting at 9:15AM  -Do not eat or drink anything after midnight the night prior to your procedure.  - Take all your morning medication with a sip of water prior to arrival.  - You will not be able to drive home after your procedure.   Scheduling will call you to set up follow up appointment with Dr. [name].

## 2017-11-13 ENCOUNTER — Encounter: Payer: Self-pay | Admitting: Internal Medicine

## 2017-11-13 ENCOUNTER — Telehealth (HOSPITAL_COMMUNITY): Payer: Self-pay | Admitting: *Deleted

## 2017-11-13 ENCOUNTER — Other Ambulatory Visit (INDEPENDENT_AMBULATORY_CARE_PROVIDER_SITE_OTHER): Payer: BC Managed Care – PPO

## 2017-11-13 ENCOUNTER — Ambulatory Visit (INDEPENDENT_AMBULATORY_CARE_PROVIDER_SITE_OTHER): Payer: BC Managed Care – PPO | Admitting: Internal Medicine

## 2017-11-13 VITALS — BP 124/82 | HR 67 | Temp 97.8°F | Ht 66.0 in | Wt 154.0 lb

## 2017-11-13 DIAGNOSIS — E039 Hypothyroidism, unspecified: Secondary | ICD-10-CM

## 2017-11-13 DIAGNOSIS — E7800 Pure hypercholesterolemia, unspecified: Secondary | ICD-10-CM

## 2017-11-13 DIAGNOSIS — N400 Enlarged prostate without lower urinary tract symptoms: Secondary | ICD-10-CM

## 2017-11-13 DIAGNOSIS — Z0000 Encounter for general adult medical examination without abnormal findings: Secondary | ICD-10-CM | POA: Diagnosis not present

## 2017-11-13 DIAGNOSIS — E038 Other specified hypothyroidism: Secondary | ICD-10-CM

## 2017-11-13 DIAGNOSIS — Z23 Encounter for immunization: Secondary | ICD-10-CM

## 2017-11-13 DIAGNOSIS — I4819 Other persistent atrial fibrillation: Secondary | ICD-10-CM

## 2017-11-13 DIAGNOSIS — R972 Elevated prostate specific antigen [PSA]: Secondary | ICD-10-CM

## 2017-11-13 LAB — LIPID PANEL
Cholesterol: 158 mg/dL (ref 0–200)
HDL: 43.1 mg/dL (ref >39.00)
LDL Cholesterol: 89 mg/dL (ref 0–99)
NonHDL: 114.67
Total CHOL/HDL Ratio: 4
Triglycerides: 127 mg/dL (ref 0.0–149.0)
VLDL: 25.4 mg/dL (ref 0.0–40.0)

## 2017-11-13 LAB — PSA: PSA: 4.5

## 2017-11-13 NOTE — Telephone Encounter (Signed)
Patient reports being back in NSR at his PCP office this morning - confirmed with EKG. Will cancel cardioversion for tomorrow. Per Roderic Palau NP will continue metoprolol at current dose until follow up with Dr. [name] in few weeks. Pt in agreement.

## 2017-11-13 NOTE — Progress Notes (Signed)
Subjective:  Patient ID: Ronnie Black, male    DOB: March 27, 1945  Age: 72 y.o. MRN: [id number]  CC: Annual Exam and Atrial Fibrillation   HPI Ronnie Black presents for a CPX.  He tells me that his heart rate converted to sinus rhythm one day prior to this visit.  He has felt well recently with no episodes of palpitations.  He does a run/walk every day and denies any recent episodes of CP, DOE, edema, or fatigue.  Past Medical History:  Diagnosis Date  . DDD (degenerative disc disease)   . HLD (hyperlipidemia)   . 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 Persistent atrial fibrillation    s/p PVI 3/12   Past Surgical History:  Procedure Laterality Date  . APPENDECTOMY    . atrial fibrillation ablation  03/27/10   PVI with CTI ablation by JA  . ATRIAL FIBRILLATION ABLATION N/A 11/21/2016   Procedure: ATRIAL FIBRILLATION ABLATION;  Surgeon: [name], MD;  Location: [place] [facility];  Service: Cardiovascular;  Laterality: N/A;  . CARDIOVERSION    . CARDIOVERSION  04/26/2011   Procedure: CARDIOVERSION;  Surgeon: Larey Dresser, MD;  Location: [place];  Service: Cardiovascular;  Laterality: N/A;  . CARDIOVERSION N/A 04/28/2012   Procedure: CARDIOVERSION;  Surgeon: Thayer Headings, MD;  Location: [place];  Service: Cardiovascular;  Laterality: N/A;  . CARDIOVERSION N/A 07/28/2012   Procedure: CARDIOVERSION;  Surgeon: Lelon Perla, MD;  Location: [facility] ENDOSCOPY;  Service: Cardiovascular;  Laterality: N/A;  . CARDIOVERSION N/A 08/14/2012   Procedure: CARDIOVERSION;  Surgeon: Josue Hector, MD;  Location: [place];  Service: Cardiovascular;  Laterality: N/A;  . CARDIOVERSION N/A 08/11/2014   Procedure: CARDIOVERSION;  Surgeon: Fay Records, MD;  Location: [facility] ENDOSCOPY;  Service: Cardiovascular;  Laterality: N/A;  . CARDIOVERSION N/A 08/26/2014   Procedure: CARDIOVERSION;  Surgeon: Sanda Klein, MD;  Location: [facility] ENDOSCOPY;  Service:  Cardiovascular;  Laterality: N/A;  . CARDIOVERSION N/A 10/10/2016   Procedure: CARDIOVERSION;  Surgeon: Larey Dresser, MD;  Location: [facility] ENDOSCOPY;  Service: Cardiovascular;  Laterality: N/A;  . CARDIOVERSION N/A 12/04/2016   Procedure: CARDIOVERSION;  Surgeon: Larey Dresser, MD;  Location: [facility] ENDOSCOPY;  Service: Cardiovascular;  Laterality: N/A;  . SKIN CANCER EXCISION    . TEE WITHOUT CARDIOVERSION  04/26/2011   Procedure: TRANSESOPHAGEAL ECHOCARDIOGRAM (TEE);  Surgeon: Larey Dresser, MD;  Location: [facility] ENDOSCOPY;  Service: Cardiovascular;  Laterality: N/A;  to be done at 1330  . TEE WITHOUT CARDIOVERSION N/A 04/28/2012   Procedure: TRANSESOPHAGEAL ECHOCARDIOGRAM (TEE);  Surgeon: Thayer Headings, MD;  Location: [place];  Service: Cardiovascular;  Laterality: N/A;  . WISDOM TOOTH EXTRACTION      reports that he quit smoking about 41 years ago. His smoking use included cigarettes. He has a 15.00 pack-year smoking history. He has never used smokeless tobacco. He reports that he drinks about 6.0 standard drinks of alcohol per week. He reports that he does not use drugs. family history includes Colon cancer in his father and mother; Coronary artery disease in his unknown relative; Lung cancer in his mother. No Known Allergies  Outpatient Medications Prior to Visit  Medication Sig Dispense Refill  . atorvastatin (LIPITOR) 10 MG tablet Take 1 tablet (10 mg total) by mouth daily. (Patient taking differently: Take 10 mg by mouth at bedtime. ) 90 tablet 3  . ELIQUIS 5 MG TABS tablet TAKE 1 TABLET BY MOUTH TWICE DAILY (Patient taking  differently: Take 5 mg by mouth 2 (two) times daily. ) 60 tablet 5  . fluticasone (CUTIVATE) 0.005 % ointment Apply 1 application topically 2 (two) times daily as needed (for skin rash/irritation.).     Marland Kitchen metoprolol tartrate (LOPRESSOR) 25 MG tablet Take 1.5 tablets (37.5 mg total) by mouth 2 (two) times daily. Take one (1) tablet by mouth twice daily. 90 tablet 3    . Multiple Vitamin (MULTIVITAMIN WITH MINERALS) TABS tablet Take 1 tablet by mouth 3 (three) times a week.    . sildenafil (VIAGRA) 25 MG tablet Take 25 mg by mouth as needed for erectile dysfunction. Take as directed    . ibuprofen (ADVIL,MOTRIN) 200 MG tablet Take 200-400 mg by mouth every 6 (six) hours as needed (for pain.).     No facility-administered medications prior to visit.     ROS Review of Systems  Constitutional: Negative for diaphoresis and fatigue.  HENT: Negative.   Eyes: Negative for visual disturbance.  Respiratory: Negative for cough, chest tightness, shortness of breath and wheezing.   Cardiovascular: Negative for chest pain, palpitations and leg swelling.  Gastrointestinal: Negative for abdominal pain, constipation, diarrhea, nausea and vomiting.  Endocrine: Negative.   Genitourinary: Negative.  Negative for difficulty urinating, testicular pain and urgency.  Musculoskeletal: Negative.  Negative for arthralgias and myalgias.  Skin: Negative for color change, pallor and rash.  Neurological: Negative.  Negative for dizziness, syncope, weakness and light-headedness.  Hematological: Negative for adenopathy. Does not bruise/bleed easily.  Psychiatric/Behavioral: Negative.     Objective:  BP 124/82   Pulse 67   Temp 97.8 F (36.6 C) (Oral)   Ht 5\' 6"  (1.676 m)   Wt 154 lb (69.9 kg)   SpO2 96%   BMI 24.86 kg/m   BP Readings from Last 3 Encounters:  11/13/17 124/82  11/10/17 128/82  10/23/17 126/82    Wt Readings from Last 3 Encounters:  11/13/17 154 lb (69.9 kg)  11/10/17 157 lb (71.2 kg)  10/23/17 155 lb (70.3 kg)    Physical Exam  Constitutional: He is oriented to person, place, and time. No distress.  HENT:  Mouth/Throat: Oropharynx is clear and moist. No oropharyngeal exudate.  Eyes: Conjunctivae are normal. No scleral icterus.  Neck: Normal range of motion. Neck supple. No JVD present. No thyromegaly present.  Cardiovascular: Normal rate,  regular rhythm, S1 normal and normal heart sounds. Exam reveals no gallop.  No murmur heard. EKG ---  Sinus  Rhythm  -RSR(V1) -nondiagnostic.   -Left atrial enlargement.   -  T-abnormality  - Anterior/lateral ischemia.   ABNORMAL   Pulmonary/Chest: Effort normal and breath sounds normal. He has no wheezes. He has no rales.  Abdominal: Soft. Normal appearance and bowel sounds are normal. He exhibits no mass. There is no tenderness. Hernia confirmed negative in the right inguinal area and confirmed negative in the left inguinal area.  Genitourinary: Testes normal and penis normal. Rectal exam shows no external hemorrhoid, no internal hemorrhoid, no fissure, no mass, no tenderness, anal tone normal and guaiac negative stool. Prostate is enlarged (1+ smooth symm BPH). Prostate is not tender. Right testis shows no mass, no swelling and no tenderness. Left testis shows no mass, no swelling and no tenderness. Circumcised. No penile erythema or penile tenderness. No discharge found.  Musculoskeletal: Normal range of motion. He exhibits no edema or deformity.  Lymphadenopathy:    He has no cervical adenopathy. No inguinal adenopathy noted on the right or left side.  Neurological: He  is alert and oriented to person, place, and time.  Skin: Skin is warm and dry. No rash noted. He is not diaphoretic.  Vitals reviewed.   Lab Results  Component Value Date   WBC 6.0 11/10/2017   HGB 15.8 11/10/2017   HCT 48.2 11/10/2017   PLT 226 11/10/2017   GLUCOSE 101 (H) 11/10/2017   CHOL 158 11/13/2017   TRIG 127.0 11/13/2017   HDL 43.10 11/13/2017   LDLCALC 89 11/13/2017   ALT 26 11/07/2016   AST 19 11/07/2016   NA 137 11/10/2017   K 4.1 11/10/2017   CL 100 11/10/2017   CREATININE 0.94 11/10/2017   BUN 10 11/10/2017   CO2 30 11/10/2017   TSH 3.36 11/13/2017   PSA 4.5 11/13/2017   INR 1.3 (H) 02/15/2010    No results found.  Assessment & Plan:   Maximo was seen today for annual exam and atrial  fibrillation.  Diagnoses and all orders for this visit:  Subclinical hypothyroidism-his TSH is normal and he appears euthyroid.  Thyroid replacement therapy is not indicated. -     Thyroid Panel With TSH; Future  Persistent atrial fibrillation-he has converted to sinus rhythm.  We will continue anticoagulation and he will follow-up with his cardiologist. -     Thyroid Panel With TSH; Future -     EKG 12-Lead  Pure hypercholesterolemia-he has achieved his LDL goal and is doing well on the statin. -     Lipid panel; Future  Rising PSA level- His PSA is in the upper limits of normal.  Based on the percent free PSA he has about a 20% chance of having prostate cancer.  I have asked him return in about 4 months to have this rechecked.  If it continues to rise then will consider referral to urology for evaluation of prostate cancer. -     PSA, total and free; Future  Benign prostatic hyperplasia without lower urinary tract symptoms- He has no symptoms that need to be treated. -     PSA, total and free; Future  Routine general medical examination at a health care facility  Other orders -     Flu vaccine HIGH DOSE PF   I have discontinued Ronnie Black's ibuprofen. I am also having him maintain his sildenafil, fluticasone, atorvastatin, ELIQUIS, multivitamin with minerals, and metoprolol tartrate.  No orders of the defined types were placed in this encounter.  See AVS for instructions about healthy living and anticipatory guidance.  Follow-up: Return in about 6 months (around 05/15/2018).  Scarlette Calico, MD

## 2017-11-13 NOTE — Patient Instructions (Signed)

## 2017-11-14 ENCOUNTER — Ambulatory Visit (HOSPITAL_COMMUNITY)
Admission: RE | Admit: 2017-11-14 | Payer: BC Managed Care – PPO | Source: Ambulatory Visit | Admitting: Internal Medicine

## 2017-11-14 ENCOUNTER — Encounter (HOSPITAL_COMMUNITY): Admission: RE | Payer: Self-pay | Source: Ambulatory Visit

## 2017-11-14 ENCOUNTER — Encounter: Payer: Self-pay | Admitting: Internal Medicine

## 2017-11-14 LAB — THYROID PANEL WITH TSH
Free Thyroxine Index: 2.3 (ref 1.4–3.8)
T3 Uptake: 30 % (ref 22–35)
T4, Total: 7.7 ug/dL (ref 4.9–10.5)
TSH: 3.36 mIU/L (ref 0.40–4.50)

## 2017-11-14 LAB — PSA, TOTAL AND FREE
PSA, % Free: 16 % (calc) — ABNORMAL LOW (ref >25)
PSA, Free: 0.7 ng/mL
PSA, Total: 4.5 ng/mL — ABNORMAL HIGH (ref <=4.0)

## 2017-11-14 SURGERY — CARDIOVERSION
Anesthesia: General

## 2017-11-16 NOTE — Assessment & Plan Note (Signed)

## 2017-11-21 ENCOUNTER — Other Ambulatory Visit: Payer: Self-pay | Admitting: Internal Medicine

## 2017-11-26 ENCOUNTER — Ambulatory Visit (HOSPITAL_BASED_OUTPATIENT_CLINIC_OR_DEPARTMENT_OTHER): Payer: BC Managed Care – PPO | Attending: Pulmonary Disease | Admitting: Pulmonary Disease

## 2017-11-26 ENCOUNTER — Ambulatory Visit: Payer: BC Managed Care – PPO | Admitting: Internal Medicine

## 2017-11-26 VITALS — Ht 66.0 in | Wt 150.0 lb

## 2017-11-26 DIAGNOSIS — I480 Paroxysmal atrial fibrillation: Secondary | ICD-10-CM | POA: Diagnosis present

## 2017-11-26 DIAGNOSIS — G4752 REM sleep behavior disorder: Secondary | ICD-10-CM | POA: Insufficient documentation

## 2017-11-26 DIAGNOSIS — R0902 Hypoxemia: Secondary | ICD-10-CM | POA: Diagnosis not present

## 2017-12-03 ENCOUNTER — Telehealth: Payer: Self-pay | Admitting: Pulmonary Disease

## 2017-12-03 NOTE — Telephone Encounter (Signed)
Sleep study result  Date of study 11/26/2017  Study did not reveal any REM behavior disorder No significant sleep disordered breathing  Clinical follow-up of symptoms Maintenance of safe environment with concern for REM behavior disorder Early follow-up if concerns

## 2017-12-03 NOTE — Procedures (Signed)
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 Indications include Restless Sleep with Limb Movments, Snoring.  MEDICATIONS Patient self administered medications include: ATORVASTATIN. Medications administered during study include No sleep medicine administered.  SLEEP STUDY TECHNIQUE A multi-channel overnight Polysomnography study was performed. The channels recorded and monitored were central and occipital EEG, electrooculogram (EOG), submentalis EMG (chin), nasal and oral airflow, thoracic and abdominal wall motion, anterior tibialis EMG, snore microphone, electrocardiogram, and a pulse oximetry. TECHNICIAN COMMENTS Comments added by Technician: Patient was restless all through the night. Patient had difficulty initiating sleep. Comments added by Scorer: N/A SLEEP ARCHITECTURE The study was initiated at 9:49:23 PM and terminated at 4:57:05 AM. The total recorded time was 427.7 minutes. EEG confirmed total sleep time was 242.5 minutes yielding a sleep efficiency of 56.7%%. Sleep onset after lights out was 29.5 minutes with a REM latency of 163.5 minutes. The patient spent 9.5%% of the night in stage N1 sleep, 60.2%% in stage N2 sleep, 16.3%% in stage N3 and 14% in REM. Wake after sleep onset (WASO) was 155.7 minutes. The Arousal Index was 10.9/hour. RESPIRATORY PARAMETERS There were a total of 1 respiratory disturbances out of which 1 were apneas ( 0 obstructive, 0 mixed, 1 central) and 0 hypopneas. The apnea/hypopnea index (AHI)  was 0.2 events/hour. The central sleep apnea index was 0.2 events/hour. The REM AHI was 1.8 events/hour and NREM AHI was 0.0 events/hour. The supine AHI was N/A events/hour and the non supine AHI was 0.3 supine during 0.00% of sleep. Respiratory disturbances were associated with oxygen desaturation down to a nadir of 92.0% during sleep. The mean oxygen saturation during the study was 94.9%. The cumulative time under 88% oxygen saturation was 5.5 minutes.  LEG MOVEMENT DATA The total leg movements were 554 with a resulting leg movement index of 137.1/hr .Associated arousal with leg movement index was 2.7/hr.  CARDIAC DATA The underlying cardiac rhythm was most consistent with sinus rhythm. Mean heart rate during sleep was 60.8 bpm. Additional rhythm abnormalities include PVCs.   IMPRESSIONS - No Significant Obstructive Sleep apnea(OSA) - Electrocardiographic data showed presence of PVCs. - No significant Oxygen Desaturation - The patient snored with soft snoring volume. - No significant periodic leg movements(PLMs) during sleep.  - No significant abnormal behavior noted during current study, study did not reveal any REM associated behaviors   DIAGNOSIS - Nocturnal Hypoxemia (327.26 [G47.36 ICD-10])   RECOMMENDATIONS - Avoid alcohol, sedatives and other CNS depressants that may worsen sleep apnea and disrupt normal sleep architecture. - Sleep hygiene should be reviewed to assess factors that may improve sleep quality. - Weight management and regular exercise should be initiated or continued. - Clinical follow-up of symptoms - Empiric trial with melatonin or very low dose Klonopin may be considered for REM behavior disorder if pretest probability is high.  [Electronically signed] 12/03/2017 08:11 PM  Sherrilyn Rist MD NPI: [id number]

## 2017-12-10 NOTE — Telephone Encounter (Signed)
Spoke with patient wife per the dpr and she verbalized understanding and nothing further is needed.

## 2017-12-24 ENCOUNTER — Ambulatory Visit: Payer: BC Managed Care – PPO | Admitting: Internal Medicine

## 2017-12-24 ENCOUNTER — Encounter: Payer: Self-pay | Admitting: Internal Medicine

## 2017-12-24 VITALS — BP 142/80 | HR 72 | Ht 66.0 in | Wt 157.8 lb

## 2017-12-24 DIAGNOSIS — I421 Obstructive hypertrophic cardiomyopathy: Secondary | ICD-10-CM

## 2017-12-24 DIAGNOSIS — I4819 Other persistent atrial fibrillation: Secondary | ICD-10-CM | POA: Diagnosis not present

## 2017-12-24 NOTE — Progress Notes (Signed)
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  Since last being seen in our clinic, the patient reports doing very well.  He did have atrial flutter in October but returned to sinus before he could be cardioverted. Today, he denies symptoms of palpitations, chest pain, shortness of breath,  lower extremity edema, dizziness, presyncope, or syncope.  The patient is otherwise without complaint today.   Past Medical History:  Diagnosis Date  . DDD (degenerative disc disease)   . HLD (hyperlipidemia)   . 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 Persistent atrial fibrillation    s/p PVI 3/12   Past Surgical History:  Procedure Laterality Date  . APPENDECTOMY    . atrial fibrillation ablation  03/27/10   PVI with CTI ablation by JA  . ATRIAL FIBRILLATION ABLATION N/A 11/21/2016   Procedure: ATRIAL FIBRILLATION ABLATION;  Surgeon: [name], MD;  Location: [place] [facility];  Service: Cardiovascular;  Laterality: N/A;  . CARDIOVERSION    . CARDIOVERSION  04/26/2011   Procedure: CARDIOVERSION;  Surgeon: Larey Dresser, MD;  Location: [place];  Service: Cardiovascular;  Laterality: N/A;  . CARDIOVERSION N/A 04/28/2012   Procedure: CARDIOVERSION;  Surgeon: Thayer Headings, MD;  Location: [place];  Service: Cardiovascular;  Laterality: N/A;  . CARDIOVERSION N/A 07/28/2012   Procedure: CARDIOVERSION;  Surgeon: Lelon Perla, MD;  Location: [facility] ENDOSCOPY;  Service: Cardiovascular;  Laterality: N/A;  . CARDIOVERSION N/A 08/14/2012   Procedure: CARDIOVERSION;  Surgeon: Josue Hector, MD;  Location: [place];  Service: Cardiovascular;  Laterality: N/A;  . CARDIOVERSION N/A 08/11/2014   Procedure: CARDIOVERSION;  Surgeon: Fay Records, MD;  Location: [facility] ENDOSCOPY;  Service: Cardiovascular;  Laterality: N/A;  . CARDIOVERSION N/A 08/26/2014   Procedure: CARDIOVERSION;   Surgeon: Sanda Klein, MD;  Location: [facility] ENDOSCOPY;  Service: Cardiovascular;  Laterality: N/A;  . CARDIOVERSION N/A 10/10/2016   Procedure: CARDIOVERSION;  Surgeon: Larey Dresser, MD;  Location: [facility] ENDOSCOPY;  Service: Cardiovascular;  Laterality: N/A;  . CARDIOVERSION N/A 12/04/2016   Procedure: CARDIOVERSION;  Surgeon: Larey Dresser, MD;  Location: [facility] ENDOSCOPY;  Service: Cardiovascular;  Laterality: N/A;  . SKIN CANCER EXCISION    . TEE WITHOUT CARDIOVERSION  04/26/2011   Procedure: TRANSESOPHAGEAL ECHOCARDIOGRAM (TEE);  Surgeon: Larey Dresser, MD;  Location: [facility] ENDOSCOPY;  Service: Cardiovascular;  Laterality: N/A;  to be done at 1330  . TEE WITHOUT CARDIOVERSION N/A 04/28/2012   Procedure: TRANSESOPHAGEAL ECHOCARDIOGRAM (TEE);  Surgeon: Thayer Headings, MD;  Location: [place];  Service: Cardiovascular;  Laterality: N/A;  . WISDOM TOOTH EXTRACTION      ROS- all systems are reviewed and negatives except as per HPI above  Current Outpatient Medications  Medication Sig Dispense Refill  . atorvastatin (LIPITOR) 10 MG tablet Take 1 tablet (10 mg total) by mouth daily. (Patient taking differently: Take 10 mg by mouth at bedtime. ) 90 tablet 3  . ELIQUIS 5 MG TABS tablet TAKE 1 TABLET BY MOUTH TWICE DAILY 60 tablet 5  . fluticasone (CUTIVATE) 0.005 % ointment Apply 1 application topically 2 (two) times daily as needed (for skin rash/irritation.).     Marland Kitchen metoprolol tartrate (LOPRESSOR) 25 MG tablet Take 1.5 tablets (37.5 mg total) by mouth 2 (two) times daily. Take one (1) tablet by mouth twice daily. 90 tablet 3  . Multiple Vitamin (MULTIVITAMIN  WITH MINERALS) TABS tablet Take 1 tablet by mouth 3 (three) times a week.    . sildenafil (VIAGRA) 25 MG tablet Take 25 mg by mouth as needed for erectile dysfunction. Take as directed     No current facility-administered medications for this visit.     Physical Exam: Vitals:   12/24/17 1613  BP: (!) 142/80  Pulse: 72  SpO2: 99%    Weight: 157 lb 12.8 oz (71.6 kg)  Height: 5\' 6"  (1.676 m)    GEN- The patient is well appearing, alert and oriented x 3 today.   Head- normocephalic, atraumatic Eyes-  Sclera clear, conjunctiva pink Ears- hearing intact Oropharynx- clear Lungs- Clear to ausculation bilaterally, normal work of breathing Heart- Regular rate and rhythm with frequent ectopy, no murmurs, rubs or gallops, PMI not laterally displaced GI- soft, NT, ND, + BS Extremities- no clubbing, cyanosis, or edema  Wt Readings from Last 3 Encounters:  12/24/17 157 lb 12.8 oz (71.6 kg)  11/26/17 150 lb (68 kg)  11/13/17 154 lb (69.9 kg)    EKG tracing ordered today is personally reviewed and shows sinus rhythm with freuqent PACs, incomplete RBBB, diffuse TWI (chronic)  Assessment and Plan:  1.  Persistent afib/ atrial flutter Doing well s/p ablation On eliquis He did have atrial flutter in October but has since converted to sinus rhythm.  Though ekg did appear typical with his atrial flutter, he is s/p prior CTI ablation and also has had extensive left atrial ablation, thus making it difficult to localize his atrial flutter by ekg. No changes to therapy at this time.  2. Hypertrophic nonobstructive CM Stable No change required today  3. Snoring Recent sleep study did not show sleep apnea  Return in 3 months  [name] MD, [facility] 12/24/2017 4:15 PM

## 2017-12-24 NOTE — Patient Instructions (Signed)

## 2017-12-25 NOTE — Addendum Note (Signed)
Addended by: Erinn Huskins H on: 12/25/2017 01:07 PM   Modules accepted: Orders  

## 2018-01-02 ENCOUNTER — Ambulatory Visit: Payer: BC Managed Care – PPO | Admitting: Pulmonary Disease

## 2018-01-02 ENCOUNTER — Encounter: Payer: Self-pay | Admitting: Pulmonary Disease

## 2018-01-02 VITALS — BP 136/68 | HR 69 | Ht 66.0 in | Wt 156.0 lb

## 2018-01-02 DIAGNOSIS — Z87898 Personal history of other specified conditions: Secondary | ICD-10-CM | POA: Diagnosis not present

## 2018-01-02 NOTE — Patient Instructions (Signed)
Recent negative polysomnogram Frequent leg movement during sleep-not confirmed on recent sleep study, study may have been suboptimal secondary to limited sleep compared to your usual  Empiric trial with melatonin at 5 mg nightly for 2 to 3 weeks  An option of treatment would be Klonopin at 0.5 mg p.o. q. at bedtime if melatonin is not helpful, will appreciate if you call to let us know how melatonin is helping  I will see you back in the office in about 3 months Call with any concerns/questions

## 2018-01-02 NOTE — Progress Notes (Signed)
Ronnie Black    [id number]    1945/02/26  Primary Care Physician:Jones, Arvid Right, MD  Referring Physician: [name], MD [address], [place] [address]  Chief complaint:   Abnormal movement during sleep Kicks around a lot during sleep  HPI: Recent sleep study was reviewed showing no significant movement disorder, mild snoring, no significant obstructive sleep apnea He believes the study may have been suboptimal secondary to the limited hours of sleep compared to his usual His spouse was present during the visit today and corroborates the history of frequent movements  No history of sleepwalking Still quite jumpy during sleep  He has a history of atrial fibrillation for which he had an ablation in 2018 Sleep quality and snoring is better with elevation of the head of the bed  He tries to go to bed between 10 and 11 PM, takes him about 5 to 10 minutes to fall asleep, wakes up about once or twice during the night, tries to get out of bed at 6 AM   Occupation: No pertinent occupational history Exposures: No significant exposures Smoking history: Never smoked  Outpatient Encounter Medications as of 01/02/2018  Medication Sig  . atorvastatin (LIPITOR) 10 MG tablet Take 1 tablet (10 mg total) by mouth daily. (Patient taking differently: Take 10 mg by mouth at bedtime. )  . ELIQUIS 5 MG TABS tablet TAKE 1 TABLET BY MOUTH TWICE DAILY  . fluticasone (CUTIVATE) 0.005 % ointment Apply 1 application topically 2 (two) times daily as needed (for skin rash/irritation.).   Marland Kitchen metoprolol tartrate (LOPRESSOR) 25 MG tablet Take 1.5 tablets (37.5 mg total) by mouth 2 (two) times daily. Take one (1) tablet by mouth twice daily. (Patient taking differently: Take 25 mg by mouth 2 (two) times daily. Take one (1) tablet by mouth twice daily.)  . Multiple Vitamin (MULTIVITAMIN WITH MINERALS) TABS tablet Take 1 tablet by mouth 3 (three) times a week.  . sildenafil  (VIAGRA) 25 MG tablet Take 25 mg by mouth as needed for erectile dysfunction. Take as directed   No facility-administered encounter medications on file as of 01/02/2018.     Allergies as of 01/02/2018  . (No Known Allergies)    Past Medical History:  Diagnosis Date  . DDD (degenerative disc disease)   . HLD (hyperlipidemia)   . 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 Persistent atrial fibrillation    s/p PVI 3/12    Past Surgical History:  Procedure Laterality Date  . APPENDECTOMY    . atrial fibrillation ablation  03/27/10   PVI with CTI ablation by JA  . ATRIAL FIBRILLATION ABLATION N/A 11/21/2016   Procedure: ATRIAL FIBRILLATION ABLATION;  Surgeon: [name], MD;  Location: [place] [facility];  Service: Cardiovascular;  Laterality: N/A;  . CARDIOVERSION    . CARDIOVERSION  04/26/2011   Procedure: CARDIOVERSION;  Surgeon: Larey Dresser, MD;  Location: [facility] ENDOSCOPY;  Service: Cardiovascular;  Laterality: N/A;  . CARDIOVERSION N/A 04/28/2012   Procedure: CARDIOVERSION;  Surgeon: Thayer Headings, MD;  Location: [place];  Service: Cardiovascular;  Laterality: N/A;  . CARDIOVERSION N/A 07/28/2012   Procedure: CARDIOVERSION;  Surgeon: Lelon Perla, MD;  Location: [facility] ENDOSCOPY;  Service: Cardiovascular;  Laterality: N/A;  . CARDIOVERSION N/A 08/14/2012   Procedure: CARDIOVERSION;  Surgeon: Josue Hector, MD;  Location: [place];  Service: Cardiovascular;  Laterality: N/A;  . CARDIOVERSION N/A  08/11/2014   Procedure: CARDIOVERSION;  Surgeon: Fay Records, MD;  Location: [facility] ENDOSCOPY;  Service: Cardiovascular;  Laterality: N/A;  . CARDIOVERSION N/A 08/26/2014   Procedure: CARDIOVERSION;  Surgeon: Sanda Klein, MD;  Location: [facility] ENDOSCOPY;  Service: Cardiovascular;  Laterality: N/A;  . CARDIOVERSION N/A 10/10/2016   Procedure: CARDIOVERSION;  Surgeon: Larey Dresser, MD;  Location: [facility] ENDOSCOPY;  Service: Cardiovascular;  Laterality: N/A;  .  CARDIOVERSION N/A 12/04/2016   Procedure: CARDIOVERSION;  Surgeon: Larey Dresser, MD;  Location: [facility] ENDOSCOPY;  Service: Cardiovascular;  Laterality: N/A;  . SKIN CANCER EXCISION    . TEE WITHOUT CARDIOVERSION  04/26/2011   Procedure: TRANSESOPHAGEAL ECHOCARDIOGRAM (TEE);  Surgeon: Larey Dresser, MD;  Location: [facility] ENDOSCOPY;  Service: Cardiovascular;  Laterality: N/A;  to be done at 1330  . TEE WITHOUT CARDIOVERSION N/A 04/28/2012   Procedure: TRANSESOPHAGEAL ECHOCARDIOGRAM (TEE);  Surgeon: Thayer Headings, MD;  Location: [facility] ENDOSCOPY;  Service: Cardiovascular;  Laterality: N/A;  . WISDOM TOOTH EXTRACTION      Family History  Problem Relation Age of Onset  . Coronary artery disease Unknown   . Colon cancer Mother   . Lung cancer Mother   . Colon cancer Father   . Early death Neg Hx   . Hearing loss Neg Hx   . Heart disease Neg Hx   . Hyperlipidemia Neg Hx   . Hypertension Neg Hx   . Kidney disease Neg Hx   . Stroke Neg Hx     Social History   Socioeconomic History  . Marital status: Married    Spouse name: Not on file  . Number of children: Not on file  . Years of education: Not on file  . Highest education level: Not on file  Occupational History  . Occupation: professor    Employer: [employer]  . Financial resource strain: Not on file  . Food insecurity:    Worry: Not on file    Inability: Not on file  . Transportation needs:    Medical: Not on file    Non-medical: Not on file  Tobacco Use  . Smoking status: Former Smoker    Packs/day: 1.00    Years: 15.00    Pack years: 15.00    Types: Cigarettes    Last attempt to quit: 11/28/1975    Years since quitting: 42.1  . Smokeless tobacco: Never Used  Substance and Sexual Activity  . Alcohol use: Yes    Alcohol/week: 6.0 standard drinks    Types: 3 Glasses of wine, 3 Cans of beer per week    Comment: rare wine  . Drug use: No  . Sexual activity: Yes  Lifestyle  . Physical activity:    Days  per week: Not on file    Minutes per session: Not on file  . Stress: Not on file  Relationships  . Social connections:    Talks on phone: Not on file    Gets together: Not on file    Attends religious service: Not on file    Active member of club or organization: Not on file    Attends meetings of clubs or organizations: Not on file    Relationship status: Not on file  . Intimate partner violence:    Fear of current or ex partner: Not on file    Emotionally abused: Not on file    Physically abused: Not on file    Forced sexual activity: Not on file  Other Topics  Concern  . Not on file  Social History Narrative    a professor of Therapist, occupational at [employer] in [place].    Review of Systems  Constitutional: Negative.   HENT: Negative.   Eyes: Negative.   Respiratory: Negative.   Psychiatric/Behavioral: Positive for sleep disturbance.    Vitals:   01/02/18 0855  BP: 136/68  Pulse: 69  SpO2: 100%     Physical Exam  Constitutional: He appears well-developed and well-nourished.  HENT:  Head: Normocephalic and atraumatic.  Mallampati 1  Eyes: Conjunctivae and EOM are normal. Right eye exhibits no discharge. Left eye exhibits no discharge.  Neck: Normal range of motion. Neck supple. No tracheal deviation present. No thyromegaly present.  Cardiovascular: Normal rate and regular rhythm.  Pulmonary/Chest: Effort normal and breath sounds normal. No respiratory distress.  Abdominal: Soft. Bowel sounds are normal. He exhibits no distension.  Skin: Skin is warm and dry.   Data Reviewed: Records reviewed Polysomnogram reviewed-negative for significant sleep disordered breathing, no significant movement disorder noted during study as well  Assessment:   Movement disorder during sleep -Recent study was negative for significant sleep disordered breathing -No significant REM behavior disorder noted during sleep  Atrial fibrillation-status post ablation He had an  ablation about 2018, clinically has been stable  Plan/Recommendations:  Empiric trial with melatonin was discussed, trial with Klonopin as an option was discussed as well We will try melatonin 5 mg nightly for couple of weeks If this is not helpful, trial with low-dose Klonopin will be the next option  He feels his sleep is quite restorative  The possibility of a suboptimal study as this was not completely representative of what he normally does at home was discussed No guarantee that repeating the study will be helpful An empiric trial of above medications is appropriate  Interaction between sleep disordered breathing and atrial fibrillation discussed   I will see him back in the office in about 3 months  Sherrilyn Rist MD [facility] 01/02/2018, 9:35 AM  CC: [name], MD

## 2018-03-16 ENCOUNTER — Telehealth: Payer: Self-pay

## 2018-03-16 DIAGNOSIS — R972 Elevated prostate specific antigen [PSA]: Secondary | ICD-10-CM

## 2018-03-16 NOTE — Telephone Encounter (Signed)
Per PCP - pt is due for a PSA lab to check levels.   Pt contacted and informed. Pt agreed and stated that he would be in next week to have it drawn.

## 2018-03-25 ENCOUNTER — Other Ambulatory Visit: Payer: BC Managed Care – PPO

## 2018-03-25 ENCOUNTER — Ambulatory Visit: Payer: BC Managed Care – PPO | Admitting: Internal Medicine

## 2018-03-25 ENCOUNTER — Encounter: Payer: Self-pay | Admitting: Internal Medicine

## 2018-03-25 VITALS — BP 130/82 | HR 87 | Ht 66.0 in | Wt 156.4 lb

## 2018-03-25 DIAGNOSIS — I4819 Other persistent atrial fibrillation: Secondary | ICD-10-CM | POA: Diagnosis not present

## 2018-03-25 DIAGNOSIS — R972 Elevated prostate specific antigen [PSA]: Secondary | ICD-10-CM

## 2018-03-25 DIAGNOSIS — I421 Obstructive hypertrophic cardiomyopathy: Secondary | ICD-10-CM

## 2018-03-25 MED ORDER — DISOPYRAMIDE PHOSPHATE ER 150 MG PO CP12: 150.0000 mg | ORAL_CAPSULE | Freq: Two times a day (BID) | ORAL | 3 refills | Status: DC

## 2018-03-25 NOTE — Patient Instructions (Addendum)
Medication Instructions:  Your physician has recommended you make the following change in your medication:   1.  Start taking Norpace CR 150 mg--Take one capsule by mouth twice a day  Labwork: None ordered.  Testing/Procedures: Your physician has requested that you have an echocardiogram. Echocardiography is a painless test that uses sound waves to create images of your heart. It provides your doctor with information about the size and shape of your heart and how well your heart's chambers and valves are working. This procedure takes approximately one hour. There are no restrictions for this procedure.  Schedule for ECHO  Follow-Up: Your physician wants you to follow-up in: early next week at the AFIB clinic.  Any Other Special Instructions Will Be Listed Below (If Applicable).  If you need a refill on your cardiac medications before your next appointment, please call your pharmacy.    Disopyramide extended-release capsules What is this medicine? DISOPYRAMIDE (dye soe PEER a mide) is an antiarrhythmic drug. It helps make your heart beat regularly. This medicine also helps to slow rapid heartbeats. This medicine may be used for other purposes; ask your health care provider or pharmacist if you have questions. COMMON BRAND NAME(S): Norpace CR What should I tell my health care provider before I take this medicine? They need to know if you have any of these conditions: -diabetes -difficulty passing urine -glaucoma -heart disease or previous heart attack -kidney disease -liver disease -low blood pressure -malnutrition -myasthenia gravis -an unusual or allergic reaction to disopyramide, other medicines, foods, dyes, or preservatives -pregnant or trying to get pregnant -breast-feeding How should I use this medicine? Take this medicine by mouth with a glass of water. Follow the directions on the prescription label. Do not cut, crush or chew this medicine. Take your doses at regular  intervals. Do not take your medicine more often than directed. Do not stop taking this medicine suddenly. This may cause serious, heart-related side effects. Your doctor will tell you how much medicine to take. If your doctor wants you to stop the medicine, the dose will be slowly lowered over time to avoid any side effects. Talk to your pediatrician regarding the use of this medicine in children. Special care may be needed. Overdosage: If you think you have taken too much of this medicine contact a poison control center or emergency room at once. NOTE: This medicine is only for you. Do not share this medicine with others. What if I miss a dose? If you miss a dose, take it as soon as you can. If it is almost time for your next dose, take only that dose. Do not take double or extra doses. What may interact with this medicine? Do not take this medicine with any of the following medications: -amoxapine -antivirals for HIV or AIDS -apomorphine -arsenic trioxide -certain antibiotics like clarithromycin, erythromycin, pentamidine -certain medicines for depression like maprotiline, tricyclic antidepressants -certain medicines for fungal infections like fluconazole, itraconazole, ketoconazole, posaconazole, voriconazole -certain medicines for malaria like chloroquine, halofantrine -cisapride -droperidol -haloperidol -hawthorn -methadone -other medicines for irregular heart beat like dofetilide, dronedarone -phenothiazines like chlorpromazine, mesoridazine, thioridazine -pimozide -ranolazine -sertindole -tacrolimus -vardenafil -verapamil -ziprasidone This medicine may also interact with the following medications: -certain medicines for blood pressure or heart disease like beta-blockers -certain medicines for seizures like carbamazepine, phenytoin, phenobarbital -other medicines that prolong the QT interval (cause an abnormal heart rhythm) This list may not describe all possible interactions.  Give your health care provider a list of all the medicines, herbs,  non-prescription drugs, or dietary supplements you use. Also tell them if you smoke, drink alcohol, or use illegal drugs. Some items may interact with your medicine. What should I watch for while using this medicine? Check your heart rate and blood pressure regularly while you are taking this medicine. Ask your doctor or health care professional what your heart rate and blood pressure should be, and when you should contact him or her. Your doctor or health care professional also may schedule regular tests to check your progress. You may get drowsy or dizzy. Do not drive, use machinery, or do anything that needs mental alertness until you know how this medicine affects you. Do not stand or sit up quickly, especially if you are an older patient. This reduces the risk of dizzy or fainting spells. Alcohol may interfere with the effect of this medicine. Avoid alcoholic drinks. Your mouth may get dry. Chewing sugarless gum or sucking hard candy, and drinking plenty of water may help. Contact your doctor if the problem does not go away or is severe. This medicine may cause dry eyes and blurred vision. If you wear contact lenses you may feel some discomfort. Lubricating drops may help. See your eye doctor if the problem does not go away or is severe. Avoid extreme heat. This medicine can cause you to sweat less than normal. Your body temperature could increase to dangerous levels, which may lead to heat stroke. This medicine may affect blood sugar levels. If you have diabetes, check with your doctor or health care professional before you change your diet or the dose of your diabetic medicine. What side effects may I notice from receiving this medicine? Side effects that you should report to your doctor or health care professional as soon as possible: -allergic reactions like skin rash, itching or hives, swelling of the face, lips, or  tongue -breathing problems -chest pain -confusion -cool, pale skin -fast, irregular heartbeat -feeling faint or lightheaded -fever, chills, or sore throat -headache -swelling of feet or legs -trouble passing urine or change in the amount of urine -unusually weak or tired -unusual weight gain -unusual hunger -yellowing of the eyes or skin Side effects that usually do not require medical attention (report to your doctor or health care professional if they continue or are bothersome): -change in sex drive or performance -constipation or diarrhea -nausea, vomiting -stomach pain or bloating This list may not describe all possible side effects. Call your doctor for medical advice about side effects. You may report side effects to FDA at 1-800-FDA-1088. Where should I keep my medicine? Keep out of the reach of children. Store at room temperature between 15 and 30 degrees C (59 and 86 degrees F). Throw away any unused medicine after the expiration date. NOTE: This sheet is a summary. It may not cover all possible information. If you have questions about this medicine, talk to your doctor, pharmacist, or health care provider.  2019 Elsevier/Gold Standard (2012-07-06 16:17:04)

## 2018-03-25 NOTE — Progress Notes (Signed)
PCP: [name], MD   Primary EP: [name] is a 73 y.o. male who presents today for routine electrophysiology followup.  Since last being seen in our clinic, the patient reports doing reasonably well.  He has noticed that his pulse has been erratic since I saw him last.  He feels that his pulse is "weaker".  Today, he denies symptoms of chest pain, shortness of breath,  lower extremity edema, dizziness, presyncope, or syncope.  The patient is otherwise without complaint today.   Past Medical History:  Diagnosis Date  . DDD (degenerative disc disease)   . HLD (hyperlipidemia)   . 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 Persistent atrial fibrillation    s/p PVI 3/12   Past Surgical History:  Procedure Laterality Date  . APPENDECTOMY    . atrial fibrillation ablation  03/27/10   PVI with CTI ablation by JA  . ATRIAL FIBRILLATION ABLATION N/A 11/21/2016   Procedure: ATRIAL FIBRILLATION ABLATION;  Surgeon: [name], MD;  Location: [place] [facility];  Service: Cardiovascular;  Laterality: N/A;  . CARDIOVERSION    . CARDIOVERSION  04/26/2011   Procedure: CARDIOVERSION;  Surgeon: Larey Dresser, MD;  Location: [place];  Service: Cardiovascular;  Laterality: N/A;  . CARDIOVERSION N/A 04/28/2012   Procedure: CARDIOVERSION;  Surgeon: Thayer Headings, MD;  Location: [place];  Service: Cardiovascular;  Laterality: N/A;  . CARDIOVERSION N/A 07/28/2012   Procedure: CARDIOVERSION;  Surgeon: Lelon Perla, MD;  Location: [facility] ENDOSCOPY;  Service: Cardiovascular;  Laterality: N/A;  . CARDIOVERSION N/A 08/14/2012   Procedure: CARDIOVERSION;  Surgeon: Josue Hector, MD;  Location: [place];  Service: Cardiovascular;  Laterality: N/A;  . CARDIOVERSION N/A 08/11/2014   Procedure: CARDIOVERSION;  Surgeon: Fay Records, MD;  Location: [facility] ENDOSCOPY;  Service: Cardiovascular;  Laterality: N/A;  . CARDIOVERSION N/A 08/26/2014   Procedure:  CARDIOVERSION;  Surgeon: Sanda Klein, MD;  Location: [facility] ENDOSCOPY;  Service: Cardiovascular;  Laterality: N/A;  . CARDIOVERSION N/A 10/10/2016   Procedure: CARDIOVERSION;  Surgeon: Larey Dresser, MD;  Location: [facility] ENDOSCOPY;  Service: Cardiovascular;  Laterality: N/A;  . CARDIOVERSION N/A 12/04/2016   Procedure: CARDIOVERSION;  Surgeon: Larey Dresser, MD;  Location: [facility] ENDOSCOPY;  Service: Cardiovascular;  Laterality: N/A;  . SKIN CANCER EXCISION    . TEE WITHOUT CARDIOVERSION  04/26/2011   Procedure: TRANSESOPHAGEAL ECHOCARDIOGRAM (TEE);  Surgeon: Larey Dresser, MD;  Location: [facility] ENDOSCOPY;  Service: Cardiovascular;  Laterality: N/A;  to be done at 1330  . TEE WITHOUT CARDIOVERSION N/A 04/28/2012   Procedure: TRANSESOPHAGEAL ECHOCARDIOGRAM (TEE);  Surgeon: Thayer Headings, MD;  Location: [place];  Service: Cardiovascular;  Laterality: N/A;  . WISDOM TOOTH EXTRACTION      ROS- all systems are reviewed and negatives except as per HPI above  Current Outpatient Medications  Medication Sig Dispense Refill  . atorvastatin (LIPITOR) 10 MG tablet Take 1 tablet (10 mg total) by mouth daily. (Patient taking differently: Take 10 mg by mouth at bedtime. ) 90 tablet 3  . ELIQUIS 5 MG TABS tablet TAKE 1 TABLET BY MOUTH TWICE DAILY 60 tablet 5  . fluticasone (CUTIVATE) 0.005 % ointment Apply 1 application topically 2 (two) times daily as needed (for skin rash/irritation.).     Marland Kitchen metoprolol tartrate (LOPRESSOR) 25 MG tablet Take 1.5 tablets (37.5 mg total) by mouth 2 (two) times daily. Take one (1) tablet by mouth twice daily. (Patient taking  differently: Take 25 mg by mouth 2 (two) times daily. Take one (1) tablet by mouth twice daily.) 90 tablet 3  . Multiple Vitamin (MULTIVITAMIN WITH MINERALS) TABS tablet Take 1 tablet by mouth 3 (three) times a week.    . sildenafil (VIAGRA) 25 MG tablet Take 25 mg by mouth as needed for erectile dysfunction. Take as directed     No current  facility-administered medications for this visit.     Physical Exam: Vitals:   03/25/18 1032  BP: 130/82  Pulse: 87  SpO2: 98%  Weight: 156 lb 6.4 oz (70.9 kg)  Height: 5\' 6"  (1.676 m)    GEN- The patient is well appearing, alert and oriented x 3 today.   Head- normocephalic, atraumatic Eyes-  Sclera clear, conjunctiva pink Ears- hearing intact Oropharynx- clear Lungs- Clear to ausculation bilaterally, normal work of breathing Heart- Regular rate and rhythm, no murmurs, rubs or gallops, PMI not laterally displaced GI- soft, NT, ND, + BS Extremities- no clubbing, cyanosis, or edema  Wt Readings from Last 3 Encounters:  03/25/18 156 lb 6.4 oz (70.9 kg)  01/02/18 156 lb (70.8 kg)  12/24/17 157 lb 12.8 oz (71.6 kg)    EKG tracing ordered today is personally reviewed and shows afib  Assessment and Plan:  1. Persistent afib/ atrial flutter He has returned to afib Start norpace CD 150mg  BID Follow-up in AF clinic next week. May require cardioversion at that time if still in AF.  2. Hypertrophic CM Stable No change required today  3. Snoring Recent sleep study did not show apnea  Follow-up in AF clinic in 1 week   [name] MD, [facility] 03/25/2018 10:53 AM

## 2018-03-26 ENCOUNTER — Encounter: Payer: Self-pay | Admitting: Internal Medicine

## 2018-03-26 ENCOUNTER — Other Ambulatory Visit: Payer: Self-pay | Admitting: Internal Medicine

## 2018-03-26 DIAGNOSIS — R972 Elevated prostate specific antigen [PSA]: Secondary | ICD-10-CM

## 2018-03-26 LAB — PSA, TOTAL AND FREE
PSA, % Free: 12 % (calc) — ABNORMAL LOW (ref >25)
PSA, Free: 0.6 ng/mL
PSA, Total: 5.1 ng/mL — ABNORMAL HIGH (ref <=4.0)

## 2018-03-26 LAB — PSA
PSA: 5.1
PSA: 5.1

## 2018-03-31 ENCOUNTER — Encounter (HOSPITAL_COMMUNITY): Payer: Self-pay | Admitting: Nurse Practitioner

## 2018-03-31 ENCOUNTER — Ambulatory Visit (HOSPITAL_COMMUNITY)
Admission: RE | Admit: 2018-03-31 | Discharge: 2018-03-31 | Disposition: A | Discharge status: Home / Self Care | Payer: BC Managed Care – PPO | Source: Ambulatory Visit | Attending: Nurse Practitioner | Admitting: Nurse Practitioner

## 2018-03-31 ENCOUNTER — Other Ambulatory Visit: Payer: Self-pay

## 2018-03-31 VITALS — BP 118/76 | HR 74 | Ht 66.0 in | Wt 155.0 lb

## 2018-03-31 DIAGNOSIS — I4819 Other persistent atrial fibrillation: Secondary | ICD-10-CM | POA: Diagnosis not present

## 2018-03-31 DIAGNOSIS — Z79899 Other long term (current) drug therapy: Secondary | ICD-10-CM | POA: Insufficient documentation

## 2018-03-31 DIAGNOSIS — Z7901 Long term (current) use of anticoagulants: Secondary | ICD-10-CM | POA: Insufficient documentation

## 2018-03-31 DIAGNOSIS — E785 Hyperlipidemia, unspecified: Secondary | ICD-10-CM | POA: Insufficient documentation

## 2018-03-31 DIAGNOSIS — Z87891 Personal history of nicotine dependence: Secondary | ICD-10-CM | POA: Insufficient documentation

## 2018-03-31 LAB — BASIC METABOLIC PANEL
Anion gap: 9 (ref 5–15)
BUN: 13 mg/dL (ref 8–23)
CO2: 27 mmol/L (ref 22–32)
Calcium: 9.7 mg/dL (ref 8.9–10.3)
Chloride: 103 mmol/L (ref 98–111)
Creatinine, Ser: 1.06 mg/dL (ref 0.61–1.24)
GFR calc Af Amer: >60 mL/min (ref >60)
GFR calc non Af Amer: >60 mL/min (ref >60)
Glucose, Bld: 78 mg/dL (ref 70–99)
Potassium: 4.1 mmol/L (ref 3.5–5.1)
Sodium: 139 mmol/L (ref 135–145)

## 2018-03-31 LAB — CBC
HCT: 50.4 % (ref 39.0–52.0)
Hemoglobin: 16.4 g/dL (ref 13.0–17.0)
MCH: 28.6 pg (ref 26.0–34.0)
MCHC: 32.5 g/dL (ref 30.0–36.0)
MCV: 88 fL (ref 80.0–100.0)
Platelets: 212 10*3/uL (ref 150–400)
RBC: 5.73 MIL/uL (ref 4.22–5.81)
RDW: 12.2 % (ref 11.5–15.5)
WBC: 7 10*3/uL (ref 4.0–10.5)
nRBC: 0 % (ref 0.0–0.2)

## 2018-03-31 NOTE — Patient Instructions (Signed)
Cardioversion scheduled for Tuesday, March 24th  - Arrive at the [facility] and go to admitting at 12PM  -Do not eat or drink anything after midnight the night prior to your procedure.  - Take all your morning medication with a sip of water prior to arrival.  - You will not be able to drive home after your procedure.

## 2018-03-31 NOTE — Progress Notes (Signed)
Patient ID: Ronnie Black, male   DOB: 1945-06-26, 73 y.o.   MRN: [id number]     Primary Care Physician: [name], MD Referring Physician: Dr. 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 male with a h/o afib, frequent cardioversions in  the past. He has had afib/flutter ablation in 2012  and afib ablation fall of 2018. He was on norpace at one time, changed to amiodarone which was stopped shortly after last ablation.  He has done well the majority of last year. He saw Dr. [name]  03/25/18 and  was in afib so Norpace was restarted. He wanted pt seen in the afib clinic to be set up for cardioversion after restarting Norpace, if drug did not convert pt.   Unfortunately, pt continues in afib.He is minimally symptomatic in rate controlled afib.He has not missed any of his xarelto doses for the last 3 weeks.  Today, he denies symptoms of  chest pain, shortness of breath, orthopnea, PND, lower extremity edema, dizziness, presyncope, syncope, or neurologic sequela.  .The patient is tolerating medications without difficulties and is otherwise without complaint today.   Past Medical History:  Diagnosis Date  . DDD (degenerative disc disease)   . HLD (hyperlipidemia)   . 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 Persistent atrial fibrillation    s/p PVI 3/12   Past Surgical History:  Procedure Laterality Date  . APPENDECTOMY    . atrial fibrillation ablation  03/27/10   PVI with CTI ablation by JA  . ATRIAL FIBRILLATION ABLATION N/A 11/21/2016   Procedure: ATRIAL FIBRILLATION ABLATION;  Surgeon: [name], MD;  Location: [place] [facility];  Service: Cardiovascular;  Laterality: N/A;  . CARDIOVERSION    . CARDIOVERSION  04/26/2011   Procedure: CARDIOVERSION;  Surgeon: Larey Dresser, MD;  Location: [place];  Service: Cardiovascular;  Laterality: N/A;  . CARDIOVERSION N/A 04/28/2012   Procedure: CARDIOVERSION;  Surgeon: Thayer Headings, MD;  Location: [place];  Service: Cardiovascular;  Laterality: N/A;  . CARDIOVERSION N/A 07/28/2012   Procedure: CARDIOVERSION;  Surgeon: Lelon Perla, MD;  Location: [facility] ENDOSCOPY;  Service: Cardiovascular;  Laterality: N/A;  . CARDIOVERSION N/A 08/14/2012   Procedure: CARDIOVERSION;  Surgeon: Josue Hector, MD;  Location: [place];  Service: Cardiovascular;  Laterality: N/A;  . CARDIOVERSION N/A 08/11/2014   Procedure: CARDIOVERSION;  Surgeon: Fay Records, MD;  Location: [facility] ENDOSCOPY;  Service: Cardiovascular;  Laterality: N/A;  . CARDIOVERSION N/A 08/26/2014   Procedure: CARDIOVERSION;  Surgeon: Sanda Klein, MD;  Location: [facility] ENDOSCOPY;  Service: Cardiovascular;  Laterality: N/A;  . CARDIOVERSION N/A 10/10/2016   Procedure: CARDIOVERSION;  Surgeon: Larey Dresser, MD;  Location: [facility] ENDOSCOPY;  Service: Cardiovascular;  Laterality: N/A;  . CARDIOVERSION N/A 12/04/2016   Procedure: CARDIOVERSION;  Surgeon: Larey Dresser, MD;  Location: [facility] ENDOSCOPY;  Service: Cardiovascular;  Laterality: N/A;  . SKIN CANCER EXCISION    . TEE WITHOUT CARDIOVERSION  04/26/2011   Procedure: TRANSESOPHAGEAL ECHOCARDIOGRAM (TEE);  Surgeon: Larey Dresser, MD;  Location: [facility] ENDOSCOPY;  Service: Cardiovascular;  Laterality: N/A;  to be done at 1330  . TEE WITHOUT CARDIOVERSION N/A 04/28/2012   Procedure: TRANSESOPHAGEAL ECHOCARDIOGRAM (TEE);  Surgeon: Thayer Headings, MD;  Location: [place];  Service: Cardiovascular;  Laterality: N/A;  . WISDOM TOOTH EXTRACTION      Current Outpatient Medications  Medication Sig Dispense Refill  . atorvastatin (LIPITOR) 10 MG tablet Take  1 tablet (10 mg total) by mouth daily. (Patient taking differently: Take 10 mg by mouth at bedtime. ) 90 tablet 3  . disopyramide (NORPACE CR) 150 MG 12 hr capsule Take 1 capsule (150 mg total) by mouth 2 (two) times daily. 180 capsule 3  . ELIQUIS 5 MG TABS tablet TAKE 1 TABLET BY MOUTH TWICE DAILY 60 tablet 5  . fluticasone (CUTIVATE) 0.005 %  ointment Apply 1 application topically 2 (two) times daily as needed (for skin rash/irritation.).     Marland Kitchen metoprolol tartrate (LOPRESSOR) 25 MG tablet Take 25 mg by mouth 2 (two) times daily.    . Multiple Vitamin (MULTIVITAMIN WITH MINERALS) TABS tablet Take 1 tablet by mouth 3 (three) times a week.    . sildenafil (VIAGRA) 25 MG tablet Take 25 mg by mouth as needed for erectile dysfunction. Take as directed     No current facility-administered medications for this encounter.     No Known Allergies  Social History   Socioeconomic History  . Marital status: Married    Spouse name: Not on file  . Number of children: Not on file  . Years of education: Not on file  . Highest education level: Not on file  Occupational History  . Occupation: professor    Employer: [employer]  . Financial resource strain: Not on file  . Food insecurity:    Worry: Not on file    Inability: Not on file  . Transportation needs:    Medical: Not on file    Non-medical: Not on file  Tobacco Use  . Smoking status: Former Smoker    Packs/day: 1.00    Years: 15.00    Pack years: 15.00    Types: Cigarettes    Last attempt to quit: 11/28/1975    Years since quitting: 42.3  . Smokeless tobacco: Never Used  Substance and Sexual Activity  . Alcohol use: Yes    Alcohol/week: 6.0 standard drinks    Types: 3 Glasses of wine, 3 Cans of beer per week    Comment: rare wine  . Drug use: No  . Sexual activity: Yes  Lifestyle  . Physical activity:    Days per week: Not on file    Minutes per session: Not on file  . Stress: Not on file  Relationships  . Social connections:    Talks on phone: Not on file    Gets together: Not on file    Attends religious service: Not on file    Active member of club or organization: Not on file    Attends meetings of clubs or organizations: Not on file    Relationship status: Not on file  . Intimate partner violence:    Fear of current or ex partner: Not on  file    Emotionally abused: Not on file    Physically abused: Not on file    Forced sexual activity: Not on file  Other Topics Concern  . Not on file  Social History Narrative    a professor of Therapist, occupational at [employer] in [place].    Family History  Problem Relation Age of Onset  . Coronary artery disease Unknown   . Colon cancer Mother   . Lung cancer Mother   . Colon cancer Father   . Early death Neg Hx   . Hearing loss Neg Hx   . Heart disease Neg Hx   . Hyperlipidemia Neg Hx   . Hypertension Neg  Hx   . Kidney disease Neg Hx   . Stroke Neg Hx     ROS- All systems are reviewed and negative except as per the HPI above  Physical Exam: Vitals:   03/31/18 0903  Weight: 70.3 kg  Height: 5\' 6"  (1.676 m)    GEN- The patient is well appearing, alert and oriented x 3 today.   Head- normocephalic, atraumatic Eyes-  Sclera clear, conjunctiva pink Ears- hearing intact Oropharynx- clear Neck- supple, no JVP Lymph- no cervical lymphadenopathy Lungs- Clear to ausculation bilaterally, normal work of breathing Heart- irregular rate and rhythm, no murmurs, rubs or gallops, PMI not laterally displaced GI- soft, NT, ND, + BS Extremities- no clubbing, cyanosis, or edema MS- no significant deformity or atrophy Skin- no rash or lesion Psych- euthymic mood, full affect Neuro- strength and sensation are intact  EKG- afib at 74 bpm, qrs int 100 ms, qtc 457 ms Epic records reviewed  Assessment and Plan:  1. Persistent atrial fibrillation S/p ablation 03/2010 and 11/21/16 Had maintained SR for the majority of 2019 Now norpace restarted 150 mg bid for persisistent asib He will be set up for cardioversion Continue Eliquis 5 mg bid with chadsvasc score of 2, no missed doses Continue metoprolol tartrate to 25 mg 1  Bid  Cbc/bmet today  F/u here one week after cardioversion  Butch Penny C. Garen Woolbright, [facility] [address] [place], [place]  [address] [phone]

## 2018-04-13 ENCOUNTER — Other Ambulatory Visit (HOSPITAL_COMMUNITY): Payer: Self-pay | Admitting: Nurse Practitioner

## 2018-04-13 ENCOUNTER — Encounter: Payer: Self-pay | Admitting: Anesthesiology

## 2018-04-13 ENCOUNTER — Encounter (HOSPITAL_COMMUNITY): Payer: BC Managed Care – PPO | Admitting: Physician Assistant

## 2018-04-14 ENCOUNTER — Encounter (HOSPITAL_COMMUNITY): Admission: RE | Payer: Self-pay | Source: Home / Self Care

## 2018-04-14 ENCOUNTER — Ambulatory Visit (HOSPITAL_COMMUNITY)
Admission: RE | Admit: 2018-04-14 | Payer: BC Managed Care – PPO | Source: Home / Self Care | Admitting: Cardiovascular Disease

## 2018-04-14 SURGERY — CARDIOVERSION
Anesthesia: General

## 2018-04-28 ENCOUNTER — Ambulatory Visit (HOSPITAL_COMMUNITY): Payer: BC Managed Care – PPO | Admitting: Nurse Practitioner

## 2018-05-18 ENCOUNTER — Other Ambulatory Visit: Payer: Self-pay

## 2018-05-18 ENCOUNTER — Other Ambulatory Visit: Payer: Self-pay | Admitting: Internal Medicine

## 2018-05-18 DIAGNOSIS — E785 Hyperlipidemia, unspecified: Secondary | ICD-10-CM

## 2018-05-18 MED ORDER — APIXABAN 5 MG PO TABS: 5.0000 mg | ORAL_TABLET | Freq: Two times a day (BID) | ORAL | 5 refills | Status: DC

## 2018-05-18 NOTE — Addendum Note (Signed)
Addended by: De Burrs on: 05/18/2018 10:58 AM   Modules accepted: Orders

## 2018-05-18 NOTE — Telephone Encounter (Signed)
Age 73, weight 70kg, SCr 1.06 on 03/31/18, appropriate for refill

## 2018-06-02 ENCOUNTER — Telehealth: Payer: Self-pay | Admitting: *Deleted

## 2018-06-02 NOTE — Telephone Encounter (Signed)
    COVID-19 Pre-Screening Questions:  . In the past 7 to 10 days have you had a cough,  shortness of breath, headache, congestion, fever, body aches, chills, sore throat, or sudden loss of taste or sense of smell? np . Have you been around anyone with known Covid 19. no . Have you been around anyone who is awaiting Covid 19 test results in the past 7 to 10 days? No  . Have you been around anyone who has been exposed to Covid 19, or has mentioned symptoms of Covid 19 within the past 7 to 10 days? no  If you have any concerns about symptoms your patients report please contact your leadership team, or the provider the patient is seeing in the office for further guidance.

## 2018-06-04 ENCOUNTER — Telehealth: Payer: Self-pay | Admitting: Pulmonary Disease

## 2018-06-04 ENCOUNTER — Other Ambulatory Visit (HOSPITAL_COMMUNITY): Payer: Self-pay | Admitting: *Deleted

## 2018-06-04 NOTE — Telephone Encounter (Signed)
Spoke with patient. He stated that he wanted to over the results of his sleep study that he has back in Nov. He wanted to do so over the phone instead of coming into the office due to [place]. Was able to get patient scheduled with TP on 5/18 at 330.  Nothing further needed at time of call.

## 2018-06-05 ENCOUNTER — Other Ambulatory Visit: Payer: Self-pay

## 2018-06-05 ENCOUNTER — Other Ambulatory Visit (HOSPITAL_COMMUNITY)
Admission: RE | Admit: 2018-06-05 | Discharge: 2018-06-05 | Disposition: A | Discharge status: Home / Self Care | Payer: BC Managed Care – PPO | Source: Ambulatory Visit | Attending: Cardiology | Admitting: Cardiology

## 2018-06-05 ENCOUNTER — Inpatient Hospital Stay (HOSPITAL_COMMUNITY): Admission: RE | Admit: 2018-06-05 | Payer: BC Managed Care – PPO | Source: Ambulatory Visit

## 2018-06-05 DIAGNOSIS — Z1159 Encounter for screening for other viral diseases: Secondary | ICD-10-CM | POA: Insufficient documentation

## 2018-06-06 LAB — NOVEL CORONAVIRUS, NAA (HOSP ORDER, SEND-OUT TO REF LAB; TAT 18-24 HRS): SARS-CoV-2, NAA: NOT DETECTED

## 2018-06-08 ENCOUNTER — Other Ambulatory Visit: Payer: Self-pay

## 2018-06-08 ENCOUNTER — Ambulatory Visit (INDEPENDENT_AMBULATORY_CARE_PROVIDER_SITE_OTHER): Payer: BC Managed Care – PPO | Admitting: Adult Health

## 2018-06-08 DIAGNOSIS — Z712 Person consulting for explanation of examination or test findings: Secondary | ICD-10-CM

## 2018-06-08 NOTE — Progress Notes (Signed)
Virtual Visit via Telephone Note  I connected with Ronnie Black on 06/08/18 at  3:30 PM EDT by telephone and verified that I am speaking with the correct person using two identifiers.  Location: Patient: Home  Provider: Office    I discussed the limitations, risks, security and privacy concerns of performing an evaluation and management service by telephone and the availability of in person appointments. I also discussed with the patient that there may be a patient responsible charge related to this service. The patient expressed understanding and agreed to proceed.   Telephone message  Sleep study results reviewed, Patient only has questions about his study results. Will use this as a telephone review and not telemedicine as he has no complaints. Only wants to review his results.  He came into review results of sleep study in 12/2017 with Dr. Ander Slade but once he got paper results he has multiple questions.  Sleep study showed no OSA , minimal nocturnal desaturations .  Healthy sleep regimen and trial of melatonin was recommended. He says he is sleeping well and no daytime sleepiness.  Answered all his questions in detail . He had multiple questions regarding test results and medical language of study .      Follow Up Instructions: Follow up with pulmonary /sleep As needed   Healthy sleep regimen .      The patient was provided an opportunity to ask questions and all were answered   The patient was advised to call back or seek an in-person evaluation if develop symptoms     Rexene Edison, NP

## 2018-06-09 ENCOUNTER — Ambulatory Visit (HOSPITAL_COMMUNITY)
Admission: RE | Admit: 2018-06-09 | Discharge: 2018-06-09 | Disposition: A | Discharge status: Home / Self Care | Payer: BC Managed Care – PPO | Attending: Cardiology | Admitting: Cardiology

## 2018-06-09 ENCOUNTER — Encounter (HOSPITAL_COMMUNITY)
Admission: RE | Disposition: A | Discharge status: Home / Self Care | Payer: Self-pay | Source: Home / Self Care | Attending: Cardiology

## 2018-06-09 ENCOUNTER — Ambulatory Visit (HOSPITAL_COMMUNITY): Payer: BC Managed Care – PPO | Admitting: Certified Registered"

## 2018-06-09 ENCOUNTER — Encounter (HOSPITAL_COMMUNITY): Payer: Self-pay | Admitting: *Deleted

## 2018-06-09 ENCOUNTER — Other Ambulatory Visit: Payer: Self-pay

## 2018-06-09 DIAGNOSIS — Z85828 Personal history of other malignant neoplasm of skin: Secondary | ICD-10-CM | POA: Insufficient documentation

## 2018-06-09 DIAGNOSIS — Z7901 Long term (current) use of anticoagulants: Secondary | ICD-10-CM | POA: Diagnosis not present

## 2018-06-09 DIAGNOSIS — Z791 Long term (current) use of non-steroidal anti-inflammatories (NSAID): Secondary | ICD-10-CM | POA: Insufficient documentation

## 2018-06-09 DIAGNOSIS — I4891 Unspecified atrial fibrillation: Secondary | ICD-10-CM

## 2018-06-09 DIAGNOSIS — Z87891 Personal history of nicotine dependence: Secondary | ICD-10-CM | POA: Insufficient documentation

## 2018-06-09 DIAGNOSIS — Z7951 Long term (current) use of inhaled steroids: Secondary | ICD-10-CM | POA: Insufficient documentation

## 2018-06-09 DIAGNOSIS — Z79899 Other long term (current) drug therapy: Secondary | ICD-10-CM | POA: Diagnosis not present

## 2018-06-09 DIAGNOSIS — Z8249 Family history of ischemic heart disease and other diseases of the circulatory system: Secondary | ICD-10-CM | POA: Insufficient documentation

## 2018-06-09 DIAGNOSIS — E785 Hyperlipidemia, unspecified: Secondary | ICD-10-CM | POA: Diagnosis not present

## 2018-06-09 HISTORY — PX: CARDIOVERSION: SHX1299

## 2018-06-09 LAB — POCT I-STAT 4, (NA,K, GLUC, HGB,HCT)
Glucose, Bld: 91 mg/dL (ref 70–99)
HCT: 48 % (ref 39.0–52.0)
Hemoglobin: 16.3 g/dL (ref 13.0–17.0)
Potassium: 4.1 mmol/L (ref 3.5–5.1)
Sodium: 140 mmol/L (ref 135–145)

## 2018-06-09 SURGERY — CARDIOVERSION
Anesthesia: General

## 2018-06-09 MED ORDER — LIDOCAINE HCL (CARDIAC) PF 100 MG/5ML IV SOSY
PREFILLED_SYRINGE | INTRAVENOUS | Status: DC | PRN
  Administered 2018-06-09: 20 mg via INTRATRACHEAL

## 2018-06-09 MED ORDER — SODIUM CHLORIDE 0.9 % IV SOLN
INTRAVENOUS | Status: AC | PRN
  Administered 2018-06-09: 500 mL via INTRAVENOUS

## 2018-06-09 MED ORDER — SODIUM CHLORIDE 0.9 % IV SOLN
INTRAVENOUS | Status: DC | PRN
  Administered 2018-06-09: 09:00:00 via INTRAVENOUS

## 2018-06-09 MED ORDER — PROPOFOL 10 MG/ML IV BOLUS
INTRAVENOUS | Status: DC | PRN
  Administered 2018-06-09: 50 mg via INTRAVENOUS

## 2018-06-09 NOTE — CV Procedure (Signed)
   Cardioversion Note  ORTON CAPELL [id number] Dec 08, 1945  Procedure: DC Cardioversion Indications: atrial fibrillation  Procedure Details Consent: Obtained Time Out: Verified patient identification, verified procedure, site/side was marked, verified correct patient position, special equipment/implants available, Radiology Safety Procedures followed,  medications/allergies/relevent history reviewed, required imaging and test results available.  Performed  The patient has been on adequate anticoagulation.  The patient received IV propofol administered by anesthesia staff  for sedation.  Synchronous cardioversion was performed at 120 joules.  The cardioversion was successful.   Complications: No apparent complications Patient did tolerate procedure well.   Ena Dawley, MD, [facility] 06/09/2018, 9:13 AM

## 2018-06-09 NOTE — Transfer of Care (Signed)
Immediate Anesthesia Transfer of Care Note  Patient: Ronnie Black  Procedure(s) Performed: CARDIOVERSION (N/A )  Patient Location: Endoscopy Unit  Anesthesia Type:General  Level of Consciousness: awake and drowsy  Airway & Oxygen Therapy: Patient Spontanous Breathing  Post-op Assessment: Report given to RN, Post -op Vital signs reviewed and stable and Patient moving all extremities X 4  Post vital signs: Reviewed and stable  Last Vitals:  Vitals Value Taken Time  BP    Temp    Pulse 64 06/09/2018  9:09 AM  Resp 16 06/09/2018  9:09 AM  SpO2 94 % 06/09/2018  9:09 AM  Vitals shown include unvalidated device data.  Last Pain:  Vitals:   06/09/18 0813  TempSrc: Oral  PainSc: 0-No pain         Complications: No apparent anesthesia complications

## 2018-06-09 NOTE — H&P (Signed)
Cardiology Admission History and Physical:   Patient ID: Ronnie Black MRN: [id number]; DOB: 1946-01-12   Admission date: 06/09/2018  Primary Care Provider: [name], MD Primary Cardiologist: No primary care provider on file. Primary Electrophysiologist: Dr [name]  Chief Complaint:  palpitations  History of Present Illness:   Ronnie Black is a 73 y.o. male with a h/o afib, frequent cardioversions in  the past. He has had afib/flutter ablation in 2012  and afib ablation fall of 2018. He was on norpace at one time, changed to amiodarone which was stopped shortly after last ablation.  He has done well the majority of last year. He saw Dr. [name]  03/25/18 and  was in afib so Norpace was restarted. He wanted pt seen in the afib clinic to be set up for cardioversion after restarting Norpace, if drug did not convert pt.   Unfortunately, pt continues in afib.He is minimally symptomatic in rate controlled afib.He has not missed any of his xarelto doses for the last 3 weeks.  He was seen by Roderic Palau on 3/102/2020 and scheduled for a cardioversion that was rescheduled because of Covid 19 pandemia.   Past Medical History:  Diagnosis Date  . DDD (degenerative disc disease)   . HLD (hyperlipidemia)   . 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 Persistent atrial fibrillation    s/p PVI 3/12    Past Surgical History:  Procedure Laterality Date  . APPENDECTOMY    . atrial fibrillation ablation  03/27/10   PVI with CTI ablation by JA  . ATRIAL FIBRILLATION ABLATION N/A 11/21/2016   Procedure: ATRIAL FIBRILLATION ABLATION;  Surgeon: [name], MD;  Location: [place] [facility];  Service: Cardiovascular;  Laterality: N/A;  . CARDIOVERSION    . CARDIOVERSION  04/26/2011   Procedure: CARDIOVERSION;  Surgeon: Larey Dresser, MD;  Location: [place];  Service: Cardiovascular;  Laterality: N/A;  . CARDIOVERSION N/A 04/28/2012   Procedure: CARDIOVERSION;   Surgeon: Thayer Headings, MD;  Location: [place];  Service: Cardiovascular;  Laterality: N/A;  . CARDIOVERSION N/A 07/28/2012   Procedure: CARDIOVERSION;  Surgeon: Lelon Perla, MD;  Location: [facility] ENDOSCOPY;  Service: Cardiovascular;  Laterality: N/A;  . CARDIOVERSION N/A 08/14/2012   Procedure: CARDIOVERSION;  Surgeon: Josue Hector, MD;  Location: [place];  Service: Cardiovascular;  Laterality: N/A;  . CARDIOVERSION N/A 08/11/2014   Procedure: CARDIOVERSION;  Surgeon: Fay Records, MD;  Location: [facility] ENDOSCOPY;  Service: Cardiovascular;  Laterality: N/A;  . CARDIOVERSION N/A 08/26/2014   Procedure: CARDIOVERSION;  Surgeon: Sanda Klein, MD;  Location: [facility] ENDOSCOPY;  Service: Cardiovascular;  Laterality: N/A;  . CARDIOVERSION N/A 10/10/2016   Procedure: CARDIOVERSION;  Surgeon: Larey Dresser, MD;  Location: [facility] ENDOSCOPY;  Service: Cardiovascular;  Laterality: N/A;  . CARDIOVERSION N/A 12/04/2016   Procedure: CARDIOVERSION;  Surgeon: Larey Dresser, MD;  Location: [facility] ENDOSCOPY;  Service: Cardiovascular;  Laterality: N/A;  . SKIN CANCER EXCISION    . TEE WITHOUT CARDIOVERSION  04/26/2011   Procedure: TRANSESOPHAGEAL ECHOCARDIOGRAM (TEE);  Surgeon: Larey Dresser, MD;  Location: [facility] ENDOSCOPY;  Service: Cardiovascular;  Laterality: N/A;  to be done at 1330  . TEE WITHOUT CARDIOVERSION N/A 04/28/2012   Procedure: TRANSESOPHAGEAL ECHOCARDIOGRAM (TEE);  Surgeon: Thayer Headings, MD;  Location: [place];  Service: Cardiovascular;  Laterality: N/A;  . WISDOM TOOTH EXTRACTION       Medications Prior to Admission: Prior to Admission medications   Medication Sig Start  Date End Date Taking? Authorizing Provider  apixaban (ELIQUIS) 5 MG TABS tablet Take 1 tablet (5 mg total) by mouth 2 (two) times daily. 05/18/18  Yes Allred, Jeneen Rinks, MD  atorvastatin (LIPITOR) 10 MG tablet TAKE 1 TABLET(10 MG) BY MOUTH DAILY Patient taking differently: Take 10 mg by mouth every evening.  05/18/18  Yes Allred,  Jeneen Rinks, MD  calcium carbonate (TUMS - DOSED IN MG ELEMENTAL CALCIUM) 500 MG chewable tablet Chew 1 tablet by mouth daily as needed for indigestion or heartburn.   Yes [provider]  disopyramide (NORPACE CR) 150 MG 12 hr capsule Take 1 capsule (150 mg total) by mouth 2 (two) times daily. 03/25/18  Yes Allred, Jeneen Rinks, MD  fluticasone (CUTIVATE) 0.005 % ointment Apply 1 application topically 2 (two) times daily as needed (for skin rash/irritation.).    Yes [provider]  metoprolol tartrate (LOPRESSOR) 25 MG tablet Take 1 tablet (25 mg total) by mouth 2 (two) times daily. 04/14/18  Yes Sherran Needs, NP  Multiple Vitamin (MULTIVITAMIN WITH MINERALS) TABS tablet Take 1 tablet by mouth 3 (three) times a week.   Yes [provider]  ibuprofen (ADVIL,MOTRIN) 200 MG tablet Take 400 mg by mouth every 8 (eight) hours as needed (for pain.).    [provider]  sildenafil (VIAGRA) 25 MG tablet Take 25 mg by mouth as needed for erectile dysfunction.     [provider]     Allergies:   No Known Allergies  Social History:   Social History   Socioeconomic History  . Marital status: Married    Spouse name: Not on file  . Number of children: Not on file  . Years of education: Not on file  . Highest education level: Not on file  Occupational History  . Occupation: professor    Employer: [employer]  . Financial resource strain: Not on file  . Food insecurity:    Worry: Not on file    Inability: Not on file  . Transportation needs:    Medical: Not on file    Non-medical: Not on file  Tobacco Use  . Smoking status: Former Smoker    Packs/day: 1.00    Years: 15.00    Pack years: 15.00    Types: Cigarettes    Last attempt to quit: 11/28/1975    Years since quitting: 42.5  . Smokeless tobacco: Never Used  Substance and Sexual Activity  . Alcohol use: Yes    Alcohol/week: 6.0 standard drinks    Types: 3 Glasses of wine, 3 Cans of  beer per week    Comment: rare wine  . Drug use: No  . Sexual activity: Yes  Lifestyle  . Physical activity:    Days per week: Not on file    Minutes per session: Not on file  . Stress: Not on file  Relationships  . Social connections:    Talks on phone: Not on file    Gets together: Not on file    Attends religious service: Not on file    Active member of club or organization: Not on file    Attends meetings of clubs or organizations: Not on file    Relationship status: Not on file  . Intimate partner violence:    Fear of current or ex partner: Not on file    Emotionally abused: Not on file    Physically abused: Not on file    Forced sexual activity: Not on file  Other  Topics Concern  . Not on file  Social History Narrative    a professor of Therapist, occupational at [employer] in [place].    Family History:   The patient's family history includes Colon cancer in his father and mother; Coronary artery disease in an other family member; Lung cancer in his mother. There is no history of Early death, Hearing loss, Heart disease, Hyperlipidemia, Hypertension, Kidney disease, or Stroke.    ROS:  Please see the history of present illness.  all other ROS reviewed and negative.     Physical Exam/Data:   Vitals:   06/09/18 0813  BP: 126/84  Resp: 20  Temp: 98.2 F (36.8 C)  TempSrc: Oral  SpO2: 98%  Weight: 68 kg  Height: 5\' 6"  (1.676 m)   No intake or output data in the 24 hours ending 06/09/18 0830 Last 3 Weights 06/09/2018 03/31/2018 03/25/2018  Weight (lbs) 150 lb 155 lb 156 lb 6.4 oz  Weight (kg) 68.04 kg 70.308 kg 70.943 kg     Body mass index is 24.21 kg/m.  General:  Well nourished, well developed, in no acute distress HEENT: normal Lymph: no adenopathy Neck: no JVD Endocrine:  No thryomegaly Vascular: No carotid bruits; FA pulses 2+ bilaterally without bruits  Cardiac:  normal S1, S2; iRRR; no murmur  Lungs:  clear to auscultation bilaterally, no wheezing,  rhonchi or rales  Abd: soft, nontender, no hepatomegaly  Ext: no edema Musculoskeletal:  No deformities, BUE and BLE strength normal and equal Skin: warm and dry  Neuro:  CNs 2-12 intact, no focal abnormalities noted Psych:  Normal affect    EKG:  The ECG that was done was personally reviewed and demonstrates atrial fibrillation  Relevant CV Studies:  Laboratory Data:  ChemistryNo results for input(s): NA, K, CL, CO2, GLUCOSE, BUN, CREATININE, CALCIUM, GFRNONAA, GFRAA, ANIONGAP in the last 168 hours.  No results for input(s): PROT, ALBUMIN, AST, ALT, ALKPHOS, BILITOT in the last 168 hours. HematologyNo results for input(s): WBC, RBC, HGB, HCT, MCV, MCH, MCHC, RDW, PLT in the last 168 hours. Cardiac EnzymesNo results for input(s): TROPONINI in the last 168 hours. No results for input(s): TROPIPOC in the last 168 hours.  BNPNo results for input(s): BNP, PROBNP in the last 168 hours.  DDimer No results for input(s): DDIMER in the last 168 hours.  Radiology/Studies:  No results found.  Assessment and Plan:   1. Atrial fibrillation  - OP cardioversion today - the patient is NPO and took his Eliquis this morning  For questions or updates, please contact [place] Please consult [email or web address] for contact info under        Signed, Ena Dawley, MD  06/09/2018 8:30 AM

## 2018-06-09 NOTE — Discharge Instructions (Signed)
Electrical Cardioversion, Care After °This sheet gives you information about how to care for yourself after your procedure. Your health care provider may also give you more specific instructions. If you have problems or questions, contact your health care provider. °What can I expect after the procedure? °After the procedure, it is common to have: °· Some redness on the skin where the shocks were given. °Follow these instructions at home: ° °· Do not drive for 24 hours if you were given a medicine to help you relax (sedative). °· Take over-the-counter and prescription medicines only as told by your health care provider. °· Ask your health care provider how to check your pulse. Check it often. °· Rest for 48 hours after the procedure or as told by your health care provider. °· Avoid or limit your caffeine use as told by your health care provider. °Contact a health care provider if: °· You feel like your heart is beating too quickly or your pulse is not regular. °· You have a serious muscle cramp that does not go away. °Get help right away if: ° °· You have discomfort in your chest. °· You are dizzy or you feel faint. °· You have trouble breathing or you are short of breath. °· Your speech is slurred. °· You have trouble moving an arm or leg on one side of your body. °· Your fingers or toes turn cold or blue. °This information is not intended to replace advice given to you by your health care provider. Make sure you discuss any questions you have with your health care provider. °Document Released: 10/28/2012 Document Revised: 08/11/2015 Document Reviewed: 07/14/2015 °Elsevier Interactive Patient Education © 2019 Elsevier Inc. ° °

## 2018-06-09 NOTE — Anesthesia Preprocedure Evaluation (Signed)
Anesthesia Evaluation  Patient identified by MRN, date of birth, ID band Patient awake    Reviewed: Allergy & Precautions, H&P , NPO status , Patient's Chart, lab work & pertinent test results  Airway Mallampati: III  TM Distance: >3 FB Neck ROM: Full    Dental no notable dental hx. (+) Teeth Intact, Dental Advisory Given   Pulmonary neg pulmonary ROS, former smoker,    Pulmonary exam normal breath sounds clear to auscultation       Cardiovascular + dysrhythmias Atrial Fibrillation  Rhythm:Irregular Rate:Normal     Neuro/Psych negative neurological ROS  negative psych ROS   GI/Hepatic negative GI ROS, Neg liver ROS,   Endo/Other  negative endocrine ROS  Renal/GU negative Renal ROS  negative genitourinary   Musculoskeletal  (+) Arthritis , Osteoarthritis,    Abdominal   Peds  Hematology negative hematology ROS (+)   Anesthesia Other Findings   Reproductive/Obstetrics negative OB ROS                             Anesthesia Physical  Anesthesia Plan  ASA: III  Anesthesia Plan: General   Post-op Pain Management:    Induction: Intravenous  PONV Risk Score and Plan: 2 and Treatment may vary due to age or medical condition  Airway Management Planned: Mask and Natural Airway  Additional Equipment:   Intra-op Plan:   Post-operative Plan:   Informed Consent: I have reviewed the patients History and Physical, chart, labs and discussed the procedure including the risks, benefits and alternatives for the proposed anesthesia with the patient or authorized representative who has indicated his/her understanding and acceptance.     Dental advisory given  Plan Discussed with: CRNA  Anesthesia Plan Comments:         Anesthesia Quick Evaluation

## 2018-06-09 NOTE — Anesthesia Postprocedure Evaluation (Signed)
Anesthesia Post Note  Patient: Ronnie Black  Procedure(s) Performed: CARDIOVERSION (N/A )     Patient location during evaluation: PACU Anesthesia Type: General Level of consciousness: awake and alert Pain management: pain level controlled Vital Signs Assessment: post-procedure vital signs reviewed and stable Respiratory status: spontaneous breathing, nonlabored ventilation, respiratory function stable and patient connected to nasal cannula oxygen Cardiovascular status: blood pressure returned to baseline and stable Postop Assessment: no apparent nausea or vomiting Anesthetic complications: no    Last Vitals:  Vitals:   06/09/18 0924 06/09/18 0934  BP: 110/74 129/78  Pulse: (!) 39 (!) 59  Resp: 16 (!) 22  Temp:    SpO2: 94%     Last Pain:  Vitals:   06/09/18 0934  TempSrc:   PainSc: 0-No pain                 Tiajuana Amass

## 2018-06-10 ENCOUNTER — Encounter (HOSPITAL_COMMUNITY): Payer: Self-pay | Admitting: Cardiology

## 2018-06-22 ENCOUNTER — Telehealth: Payer: Self-pay | Admitting: Internal Medicine

## 2018-06-22 NOTE — Telephone Encounter (Signed)
°  Caller name: Kellie Simmering  Relation to pt: [facility]  Call back number: [phone] and fax # [phone]   Reason for call:  [facility] Admission form faxed again on 06/15/2018 to [phone] requesting immediate attention. Forms re fax today, please advise    Copied from [place] [phone]. Topic: General - Inquiry >> Jun 11, 2018 10:05 AM Virl Axe D wrote: Reason for BMB:OMQTT with [facility] faxed a medical assessment last week for Dr. Ronnald Ramp to fill out. She was following up to see if it was received and when it will be returned please advise. [phone] >> Jun 11, 2018  3:57 PM Cairrikier Dian Queen, CMA wrote: lvm for [facility] to resend order

## 2018-06-22 NOTE — Telephone Encounter (Signed)
Form has been refaxed.

## 2018-06-22 NOTE — Telephone Encounter (Signed)
Called and lvm informing that the forms have been faxed.

## 2018-07-02 ENCOUNTER — Telehealth (HOSPITAL_COMMUNITY): Payer: Self-pay | Admitting: Radiology

## 2018-07-02 NOTE — Telephone Encounter (Signed)
Spoke with patient regarding scheduling his echocardiogram. He would like to see Dr. [name] first, to if echo is still needed.

## 2018-07-14 ENCOUNTER — Encounter (HOSPITAL_COMMUNITY): Payer: Self-pay | Admitting: Internal Medicine

## 2018-07-27 ENCOUNTER — Encounter: Payer: Self-pay | Admitting: Internal Medicine

## 2018-08-10 ENCOUNTER — Other Ambulatory Visit (HOSPITAL_COMMUNITY): Payer: BC Managed Care – PPO

## 2018-08-24 ENCOUNTER — Ambulatory Visit (HOSPITAL_COMMUNITY): Payer: BC Managed Care – PPO | Attending: Cardiology

## 2018-08-24 ENCOUNTER — Other Ambulatory Visit: Payer: Self-pay

## 2018-08-24 DIAGNOSIS — I4819 Other persistent atrial fibrillation: Secondary | ICD-10-CM | POA: Insufficient documentation

## 2018-08-24 DIAGNOSIS — I421 Obstructive hypertrophic cardiomyopathy: Secondary | ICD-10-CM | POA: Insufficient documentation

## 2018-08-24 MED ORDER — PERFLUTREN LIPID MICROSPHERE
1.0000 mL | INTRAVENOUS | Status: AC | PRN
  Administered 2018-08-24: 1 mL via INTRAVENOUS

## 2018-09-02 ENCOUNTER — Telehealth: Payer: Self-pay

## 2018-09-04 LAB — PSA: PSA: 4.23

## 2018-09-04 NOTE — Telephone Encounter (Signed)
Spoke with pt regarding appt on 09/07/18. Pt stated he did not have any questions at this time. Pt was advise to contact me if he has any issues.

## 2018-09-07 ENCOUNTER — Telehealth (INDEPENDENT_AMBULATORY_CARE_PROVIDER_SITE_OTHER): Payer: BC Managed Care – PPO | Admitting: Internal Medicine

## 2018-09-07 VITALS — BP 130/74 | HR 68 | Ht 66.0 in | Wt 145.0 lb

## 2018-09-07 DIAGNOSIS — I422 Other hypertrophic cardiomyopathy: Secondary | ICD-10-CM | POA: Diagnosis not present

## 2018-09-07 DIAGNOSIS — I4819 Other persistent atrial fibrillation: Secondary | ICD-10-CM | POA: Diagnosis not present

## 2018-09-07 NOTE — Progress Notes (Signed)
Electrophysiology TeleHealth Note   Due to national recommendations of social distancing due to COVID 19, an audio/video telehealth visit is felt to be most appropriate for this patient at this time.  See MyChart message from today for the patient's consent to telehealth for [facility].   Date:  09/07/2018   ID:  Ronnie Black, DOB 09/03/1945, MRN [id number]  Location: patient's home  Provider location:  [facility]  Evaluation Performed: Follow-up visit  PCP:  [name], MD   Electrophysiologist:  Dr [name]  Chief Complaint:  palpitations  History of Present Illness:    Ronnie Black is a 73 y.o. male who presents via telehealth conferencing today.  Since last being seen in our clinic, the patient reports doing very well.  He did require cardioversion in May.  Doing better since that time. He thinks that he had afib for 4-5 days in late June while at the beach.  He feels well with exercise.  Denies fatigue.  He is running and biking.  Today, he denies symptoms of  chest pain, shortness of breath,  lower extremity edema, dizziness, presyncope, or syncope.  The patient is otherwise without complaint today. He and his wife are excited about moving into [facility].  The patient denies symptoms of fevers, chills, cough, or new SOB worrisome for COVID 19.  Past Medical History:  Diagnosis Date  . DDD (degenerative disc disease)   . HLD (hyperlipidemia)   . 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 Persistent atrial fibrillation    s/p PVI 3/12    Past Surgical History:  Procedure Laterality Date  . APPENDECTOMY    . atrial fibrillation ablation  03/27/10   PVI with CTI ablation by JA  . ATRIAL FIBRILLATION ABLATION N/A 11/21/2016   Procedure: ATRIAL FIBRILLATION ABLATION;  Surgeon: [name], MD;  Location: [place] [facility];  Service: Cardiovascular;  Laterality: N/A;  . CARDIOVERSION    . CARDIOVERSION  04/26/2011   Procedure:  CARDIOVERSION;  Surgeon: Larey Dresser, MD;  Location: [place];  Service: Cardiovascular;  Laterality: N/A;  . CARDIOVERSION N/A 04/28/2012   Procedure: CARDIOVERSION;  Surgeon: Thayer Headings, MD;  Location: [place];  Service: Cardiovascular;  Laterality: N/A;  . CARDIOVERSION N/A 07/28/2012   Procedure: CARDIOVERSION;  Surgeon: Lelon Perla, MD;  Location: [facility] ENDOSCOPY;  Service: Cardiovascular;  Laterality: N/A;  . CARDIOVERSION N/A 08/14/2012   Procedure: CARDIOVERSION;  Surgeon: Josue Hector, MD;  Location: [place];  Service: Cardiovascular;  Laterality: N/A;  . CARDIOVERSION N/A 08/11/2014   Procedure: CARDIOVERSION;  Surgeon: Fay Records, MD;  Location: [place];  Service: Cardiovascular;  Laterality: N/A;  . CARDIOVERSION N/A 08/26/2014   Procedure: CARDIOVERSION;  Surgeon: Sanda Klein, MD;  Location: [facility] ENDOSCOPY;  Service: Cardiovascular;  Laterality: N/A;  . CARDIOVERSION N/A 10/10/2016   Procedure: CARDIOVERSION;  Surgeon: Larey Dresser, MD;  Location: [facility] ENDOSCOPY;  Service: Cardiovascular;  Laterality: N/A;  . CARDIOVERSION N/A 12/04/2016   Procedure: CARDIOVERSION;  Surgeon: Larey Dresser, MD;  Location: [facility] ENDOSCOPY;  Service: Cardiovascular;  Laterality: N/A;  . CARDIOVERSION N/A 06/09/2018   Procedure: CARDIOVERSION;  Surgeon: Dorothy Spark, MD;  Location: [place];  Service: Cardiovascular;  Laterality: N/A;  . SKIN CANCER EXCISION    . TEE WITHOUT CARDIOVERSION  04/26/2011   Procedure: TRANSESOPHAGEAL ECHOCARDIOGRAM (TEE);  Surgeon: Larey Dresser, MD;  Location: [place];  Service: Cardiovascular;  Laterality: N/A;  to be done at 1330  . TEE WITHOUT CARDIOVERSION N/A 04/28/2012   Procedure: TRANSESOPHAGEAL ECHOCARDIOGRAM (TEE);  Surgeon: Thayer Headings, MD;  Location: [place];  Service: Cardiovascular;  Laterality: N/A;  . WISDOM TOOTH EXTRACTION      Current Outpatient Medications  Medication Sig Dispense Refill  . apixaban  (ELIQUIS) 5 MG TABS tablet Take 1 tablet (5 mg total) by mouth 2 (two) times daily. 30 tablet 5  . atorvastatin (LIPITOR) 10 MG tablet TAKE 1 TABLET(10 MG) BY MOUTH DAILY (Patient taking differently: Take 10 mg by mouth every evening. ) 90 tablet 2  . calcium carbonate (TUMS - DOSED IN MG ELEMENTAL CALCIUM) 500 MG chewable tablet Chew 1 tablet by mouth daily as needed for indigestion or heartburn.    . disopyramide (NORPACE CR) 150 MG 12 hr capsule Take 1 capsule (150 mg total) by mouth 2 (two) times daily. 180 capsule 3  . fluticasone (CUTIVATE) 0.005 % ointment Apply 1 application topically 2 (two) times daily as needed (for skin rash/irritation.).     Marland Kitchen ibuprofen (ADVIL,MOTRIN) 200 MG tablet Take 400 mg by mouth every 8 (eight) hours as needed (for pain.).    Marland Kitchen Melatonin 10-10 MG TBCR Take 1 tablet by mouth daily.    . metoprolol tartrate (LOPRESSOR) 25 MG tablet Take 1 tablet (25 mg total) by mouth 2 (two) times daily. 180 tablet 1  . Multiple Vitamin (MULTIVITAMIN WITH MINERALS) TABS tablet Take 1 tablet by mouth 3 (three) times a week.    . sildenafil (VIAGRA) 25 MG tablet Take 25 mg by mouth as needed for erectile dysfunction.      No current facility-administered medications for this visit.     Allergies:   Patient has no known allergies.   Social History:  The patient  reports that he quit smoking about 42 years ago. His smoking use included cigarettes. He has a 15.00 pack-year smoking history. He has never used smokeless tobacco. He reports current alcohol use of about 6.0 standard drinks of alcohol per week. He reports that he does not use drugs.   Family History:  The patient's  family history includes Colon cancer in his father and mother; Coronary artery disease in an other family member; Lung cancer in his mother.   ROS:  Please see the history of present illness.   All other systems are personally reviewed and negative.    Exam:    Vital Signs:  BP 130/74   Pulse 68   Ht 5'  6" (1.676 m)   Wt 145 lb (65.8 kg)   BMI 23.40 kg/m   Well sounding and appearing, alert and conversant, regular work of breathing,  good skin color Eyes- anicteric, neuro- grossly intact, skin- no apparent rash or lesions or cyanosis, mouth- oral mucosa is pink  Labs/Other Tests and Data Reviewed:    Recent Labs: 11/13/2017: TSH 3.36 03/31/2018: BUN 13; Creatinine, Ser 1.06; Platelets 212 06/09/2018: Hemoglobin 16.3; Potassium 4.1; Sodium 140   Wt Readings from Last 3 Encounters:  09/07/18 145 lb (65.8 kg)  06/09/18 150 lb (68 kg)  03/31/18 155 lb (70.3 kg)    ekg 06/09/2018- reviewed  ASSESSMENT & PLAN:    1. Persistent afib/ atrial flutter S/p cardsioversion in May Doing well No changes Continue on norpace 150mg  BID Continue on anticoagulation He has Kardia mobile and uses this to keep track of her rhythm  2. hypertrophic nonobstructive CM Stable No change required today We reviewed his echo at length  today.  He is worried about "diastolic dysfunction".  I have reassured him today.  Regular exercise, sodium avoidance are encouraged.  3. Snoring Does not have sleep apnea   Follow-up:  AF clinic every 3 months I will see when needed   Patient Risk:  after full review of this patients clinical status, I feel that they are at moderate risk at this time.  Today, I have spent 15 minutes with the patient with telehealth technology discussing arrhythmia management .    Army Fossa, MD  09/07/2018 10:43 AM     [facility] [address] [place] [address] [phone] (office) [phone] (fax)

## 2018-09-17 ENCOUNTER — Telehealth: Payer: Self-pay | Admitting: Internal Medicine

## 2018-09-17 NOTE — Telephone Encounter (Signed)
Patient is calling to request a TOC to Dr. Jonni Sanger at [facility]. Patient was a former patient of Dr. Jonni Sanger at [facility]. And was not aware that Dr. Jonni Sanger was in [place].  Please CB- [phone]

## 2018-09-17 NOTE — Telephone Encounter (Signed)
YES, I am ok with this  TJ

## 2018-09-17 NOTE — Telephone Encounter (Signed)
Yes

## 2018-09-18 NOTE — Telephone Encounter (Signed)
Please advise on scheduling

## 2018-10-08 ENCOUNTER — Other Ambulatory Visit (HOSPITAL_COMMUNITY): Payer: Self-pay | Admitting: Nurse Practitioner

## 2018-10-27 NOTE — Telephone Encounter (Signed)
Spoke with the pt and he reports that he has spoken to multiple pharmacies and the supplier for [facility] cannot get Norpace CR 150 mg in stock.. [facility] reports they are only able to offer the 500 count at this time but [facility] and [facility] supplier does not allow for that count to be ordered.   I spoke with [facility] on [address] and they report that I can check with [name] since they use a different supplier.   Spoke with [name] [place].[id number]... they also cannot get it.[facility] also unable to get in for the pt.   Will forward to Dr. [name] and his nurse for review and recommendations.

## 2018-10-27 NOTE — Telephone Encounter (Signed)
Spoke with the pt ... he and his wife Ronnie Black.. [birth year] also Dr. [name] pt... reports that he has spoken to multiple pharmacies and the supplier for [facility] and [facility] cannot get Norpace CR 150 mg in stock.. [employer] reports they are only able to offer the 500 count at this time but [facility] and [facility] supplier does not allow for that count to be ordered. Both him and his wife are on the medication.   [facility] [address] and they report that I can check with [name] since they use a different supplier.   Spoke with [name] [place].LF:[id number]... they also cannot get it.[facility] also unable to get in for the pt.   Will forward to Dr. [name] and his nurse for review and recommendations.

## 2018-10-28 ENCOUNTER — Telehealth: Payer: Self-pay

## 2018-10-28 ENCOUNTER — Telehealth: Payer: Self-pay | Admitting: Pharmacist

## 2018-10-28 ENCOUNTER — Other Ambulatory Visit: Payer: Self-pay

## 2018-10-28 MED ORDER — DISOPYRAMIDE PHOSPHATE ER 150 MG PO CP12: 150.0000 mg | ORAL_CAPSULE | Freq: Two times a day (BID) | ORAL | 12 refills | Status: DC

## 2018-10-28 NOTE — Telephone Encounter (Signed)
Spoke with the pts wife and she reports she spoke with [place] and we have to send letter for her and the pt for the Norpace stating medical necessity..   It has to offer the [facility] address and they will then have the pharmacist at [facility] order through their wholesale as a drop ship order for the bottles of #100 that [place] has set aside during this backorder for pts with medical necessity.   I spoke with [facility] and she is working on the letters and will email to [employer] Building services engineer) at [email or web address]  [name] at [place]  I spoke with Sherren Mocha at [employer] and we did a 3 way call with [employer] his supplier for [facility] and they requested to have [place] letter sent to them also with drop sip # [id number] and Cust # [id number]..  I sent Melissa msg to fax to them also.   Pt advised where we are in the process.

## 2018-10-28 NOTE — Telephone Encounter (Signed)
Spoke with [name] at [employer] and she received the letters of medical necessity and she says they will keep refilling the pts RX and his for his wife, Ronnie Black every month and a new letter of med [name] will not need to be sent each time.   Pt advised. [facility] to call when they get the bottle of #100 in for the pt.   Will also note in the pts wife chart.

## 2018-10-28 NOTE — Telephone Encounter (Signed)
Email sent to [email or web address] requesting a drop [facility] be sent [facility] [address] [place], [place]. Request stated it was medically necessary. For Norpace CR 150mg  BID

## 2018-11-06 ENCOUNTER — Other Ambulatory Visit: Payer: Self-pay

## 2018-11-06 ENCOUNTER — Encounter: Payer: Self-pay | Admitting: Family Medicine

## 2018-11-06 ENCOUNTER — Ambulatory Visit: Payer: Medicare Other | Admitting: Family Medicine

## 2018-11-06 VITALS — BP 124/80 | HR 63 | Temp 98.4°F | Resp 16 | Ht 66.0 in | Wt 150.8 lb

## 2018-11-06 DIAGNOSIS — Z7901 Long term (current) use of anticoagulants: Secondary | ICD-10-CM

## 2018-11-06 DIAGNOSIS — I421 Obstructive hypertrophic cardiomyopathy: Secondary | ICD-10-CM | POA: Diagnosis not present

## 2018-11-06 DIAGNOSIS — E782 Mixed hyperlipidemia: Secondary | ICD-10-CM

## 2018-11-06 DIAGNOSIS — I4891 Unspecified atrial fibrillation: Secondary | ICD-10-CM

## 2018-11-06 DIAGNOSIS — Z0000 Encounter for general adult medical examination without abnormal findings: Secondary | ICD-10-CM

## 2018-11-06 DIAGNOSIS — N400 Enlarged prostate without lower urinary tract symptoms: Secondary | ICD-10-CM

## 2018-11-06 HISTORY — DX: Long term (current) use of anticoagulants: Z79.01

## 2018-11-06 LAB — COMPREHENSIVE METABOLIC PANEL
ALT: 25 U/L (ref 0–53)
AST: 20 U/L (ref 0–37)
Albumin: 4.4 g/dL (ref 3.5–5.2)
Alkaline Phosphatase: 64 U/L (ref 39–117)
BUN: 15 mg/dL (ref 6–23)
CO2: 31 mEq/L (ref 19–32)
Calcium: 9.4 mg/dL (ref 8.4–10.5)
Chloride: 101 mEq/L (ref 96–112)
Creatinine, Ser: 0.95 mg/dL (ref 0.40–1.50)
GFR: 77.2 mL/min (ref >60.00)
Glucose, Bld: 96 mg/dL (ref 70–99)
Potassium: 4.1 mEq/L (ref 3.5–5.1)
Sodium: 139 mEq/L (ref 135–145)
Total Bilirubin: 0.7 mg/dL (ref 0.2–1.2)
Total Protein: 6.4 g/dL (ref 6.0–8.3)

## 2018-11-06 LAB — CBC WITH DIFFERENTIAL/PLATELET
Basophils Absolute: 0 10*3/uL (ref 0.0–0.1)
Basophils Relative: 0.7 % (ref 0.0–3.0)
Eosinophils Absolute: 0.2 10*3/uL (ref 0.0–0.7)
Eosinophils Relative: 3 % (ref 0.0–5.0)
HCT: 45.5 % (ref 39.0–52.0)
Hemoglobin: 15.3 g/dL (ref 13.0–17.0)
Lymphocytes Relative: 37.6 % (ref 12.0–46.0)
Lymphs Abs: 1.9 10*3/uL (ref 0.7–4.0)
MCHC: 33.6 g/dL (ref 30.0–36.0)
MCV: 88.8 fl (ref 78.0–100.0)
Monocytes Absolute: 0.4 10*3/uL (ref 0.1–1.0)
Monocytes Relative: 7.2 % (ref 3.0–12.0)
Neutro Abs: 2.7 10*3/uL (ref 1.4–7.7)
Neutrophils Relative %: 51.5 % (ref 43.0–77.0)
Platelets: 180 10*3/uL (ref 150.0–400.0)
RBC: 5.12 Mil/uL (ref 4.22–5.81)
RDW: 12.9 % (ref 11.5–15.5)
WBC: 5.2 10*3/uL (ref 4.0–10.5)

## 2018-11-06 LAB — LIPID PANEL
Cholesterol: 155 mg/dL (ref 0–200)
HDL: 42.7 mg/dL (ref >39.00)
LDL Cholesterol: 80 mg/dL (ref 0–99)
NonHDL: 112.16
Total CHOL/HDL Ratio: 4
Triglycerides: 163 mg/dL — ABNORMAL HIGH (ref 0.0–149.0)
VLDL: 32.6 mg/dL (ref 0.0–40.0)

## 2018-11-06 LAB — TSH: TSH: 2.6 u[IU]/mL (ref 0.35–4.50)

## 2018-11-06 MED ORDER — SHINGRIX 50 MCG/0.5ML IM SUSR: 0.5000 mL | Freq: Once | INTRAMUSCULAR | 0 refills | Status: AC

## 2018-11-06 NOTE — Patient Instructions (Signed)
It was so good seeing you again! Thank you for establishing with my new practice and allowing me to continue caring for you. It means a lot to me.   Please schedule a follow up appointment with me in 12 months for your annual complete physical; please come fasting.  Please take the prescription for Shingrix to the pharmacy so they may administer the vaccinations. Your insurance will then cover the injections.   Glad you are doing so well.

## 2018-11-06 NOTE — Progress Notes (Signed)
Subjective  CC:  Chief Complaint  Patient presents with  . Transitions Of Care  . Form Completion    Form cometion for activity/exercising at [facility]    HPI: Ronnie Black is a 73 y.o. male is a former [place] patient and is here to reestablish care with me today.  I've reviewed chart in the interim.  He has the following concerns or needs:  Afib and h/o HOCM: still follows with Dr. [name]. Recent cardioversion for afib recurrence: doing well now in sinus. On eloquis.  Symptom free. bp and lipids have been controlled.   Bph: elevated PSA ; no obstructive sxs. Recent uro evaluation was unremarkable. Will follow annually.   HM: due shingrix. Cancer screens up to date. Due eye exam: defers till feb 2021 due to Covid-19 pandemic restrictions  Declines AWV.   Assessment  1. Annual physical exam   2. Mixed hyperlipidemia   3. Current use of long term anticoagulation   4. HOCM (hypertrophic obstructive cardiomyopathy) ([place])   5. Atrial fibrillation status post cardioversion ([place])   6. Benign prostatic hyperplasia without lower urinary tract symptoms      Plan   Routine guidance given. Doing very well.   Cardiology managing afib and hocm and all is stable.  Check labs and lipids on statin well tolerated.   Cleared for activity w/o restriction at [employer] for shingrix.   Follow up:  12 mo for cpe  Orders Placed This Encounter  Procedures  . CBC with Differential/Platelet  . Comprehensive metabolic panel  . Lipid panel  . TSH   Meds ordered this encounter  Medications  . Zoster Vaccine Adjuvanted [facility]) injection    Sig: Inject 0.5 mLs into the muscle once for 1 dose. Please give 2nd dose 2-6 months after first dose    Dispense:  2 each    Refill:  0      We updated and reviewed the patient's past history in detail and it is documented below.  Patient Active Problem List   Diagnosis Date Noted  . Current use of long term anticoagulation  11/06/2018    Priority: High  . Atrial fibrillation status post cardioversion [facility]) 02/24/2017    Priority: High    Overview:  S/p ablation therapy; on beta-blocker after cardioversion; on Pradaxa long term, cardiology Dr. [name] with [facility] Underlying septal hypertrophy   . Mixed hyperlipidemia 08/13/2008    Priority: High  . HOCM (hypertrophic obstructive cardiomyopathy) ([place]) 08/13/2008    Priority: High    Qualifier: Diagnosis of  By: Orville Govern CMA, Carol    Overview:  W/o obstruction, followed by Dr. [name], cards   . Benign prostatic hyperplasia without lower urinary tract symptoms 11/07/2016    Priority: Medium    [facility] 2020   . Herniated lumbar disc without myelopathy 07/08/2012    Priority: Medium    Overview:  occasional flares/ MRI in past   . ED (erectile dysfunction) 01/07/2012    Priority: Low  . Actinic keratosis 01/07/2012    Priority: Low  . Seborrheic keratosis 01/07/2012    Priority: Low  . CAROTID BRUIT 08/24/2008    Priority: Low    Qualifier: Diagnosis of  By: Verl Blalock, MD, FACC, [place] Maintenance  Topic Date Due  . Samul Dada  11/08/2026  . COLONOSCOPY  05/07/2027  . INFLUENZA VACCINE  Completed  . Hepatitis C Screening  Completed  . PNA vac Low Risk Adult  Completed  Immunization History  Administered Date(s) Administered  . Influenza, High Dose Seasonal PF 11/07/2016, 11/13/2017  . Influenza, Seasonal, Injecte, Preservative Fre 11/04/2013  . Pneumococcal Conjugate-13 12/02/2013  . Pneumococcal Polysaccharide-23 01/07/2012  . Tdap 11/07/2016  . Zoster 01/22/2010   Current Meds  Medication Sig  . apixaban (ELIQUIS) 5 MG TABS tablet Take 1 tablet (5 mg total) by mouth 2 (two) times daily.  Marland Kitchen atorvastatin (LIPITOR) 10 MG tablet TAKE 1 TABLET(10 MG) BY MOUTH DAILY (Patient taking differently: Take 10 mg by mouth every evening. )  . disopyramide (NORPACE CR) 150 MG 12 hr capsule Take 1 capsule (150 mg total) by  mouth 2 (two) times daily.  . Melatonin 10-10 MG TBCR Take 1 tablet by mouth daily.  . metoprolol tartrate (LOPRESSOR) 25 MG tablet TAKE 1 TABLET BY MOUTH TWICE DAILY  . Multiple Vitamin (MULTIVITAMIN WITH MINERALS) TABS tablet Take 1 tablet by mouth 3 (three) times a week.  . sildenafil (VIAGRA) 25 MG tablet Take 25 mg by mouth as needed for erectile dysfunction.     Allergies: Patient has No Known Allergies. Past Medical History Patient  has a past medical history of Current use of long term anticoagulation (11/06/2018), DDD (degenerative disc disease), HLD (hyperlipidemia), Hypertr obst cardiomyop, and Persistent atrial fibrillation ([place]). Past Surgical History Patient  has a past surgical history that includes Appendectomy; atrial fibrillation ablation (03/27/10); Wisdom tooth extraction; Skin cancer excision; Cardioversion; TEE without cardioversion (04/26/2011); Cardioversion (04/26/2011); TEE without cardioversion (N/A, 04/28/2012); Cardioversion (N/A, 04/28/2012); Cardioversion (N/A, 07/28/2012); Cardioversion (N/A, 08/14/2012); Cardioversion (N/A, 08/11/2014); Cardioversion (N/A, 08/26/2014); Cardioversion (N/A, 10/10/2016); ATRIAL FIBRILLATION ABLATION (N/A, 11/21/2016); Cardioversion (N/A, 12/04/2016); and Cardioversion (N/A, 06/09/2018). Family History: Patient family history includes Colon cancer in his father and mother; Coronary artery disease in an other family member; Lung cancer in his mother. Social History:  Patient  reports that he quit smoking about 42 years ago. His smoking use included cigarettes. He has a 15.00 pack-year smoking history. He has never used smokeless tobacco. He reports current alcohol use of about 6.0 standard drinks of alcohol per week. He reports that he does not use drugs.  Review of Systems: Constitutional: negative for fever or malaise Ophthalmic: negative for photophobia, double vision or loss of vision Cardiovascular: negative for chest pain, dyspnea on exertion, or  new LE swelling Respiratory: negative for SOB or persistent cough Gastrointestinal: negative for abdominal pain, change in bowel habits or melena Genitourinary: negative for dysuria or gross hematuria Musculoskeletal: negative for new gait disturbance or muscular weakness Integumentary: negative for new or persistent rashes Neurological: negative for TIA or stroke symptoms Psychiatric: negative for SI or delusions Allergic/Immunologic: negative for hives  Patient Care Team    Relationship Specialty Notifications Start End  [name], MD PCP - General Family Medicine  11/06/18   [name], MD  Cardiology  12/10/10   Pa, [facility] Consulting Physician Urology  11/06/18   Laurin Coder, MD Consulting Physician Pulmonary Disease  11/06/18     Objective  Vitals: BP 124/80   Pulse 63   Temp 98.4 F (36.9 C) (Tympanic)   Resp 16   Ht 5\' 6"  (1.676 m)   Wt 150 lb 12.8 oz (68.4 kg)   SpO2 98%   BMI 24.34 kg/m  General:  Well developed, well nourished, no acute distress  Psych:  Alert and oriented,normal mood and affect HEENT:  Normocephalic, atraumatic, non-icteric sclera, PERRL, oropharynx is without mass or exudate, supple neck without adenopathy, mass  or thyromegaly Cardiovascular:  RRR without gallop, rub or murmur, nondisplaced PMI Respiratory:  Good breath sounds bilaterally, CTAB with normal respiratory effort Gastrointestinal: normal bowel sounds, soft, non-tender, no noted masses. No HSM MSK: no deformities, contusions. Joints are without erythema or swelling Skin:  Warm, no rashes or suspicious lesions noted Neurologic:    Mental status is normal. Gross motor and sensory exams are normal. Normal gait  Commons side effects, risks, benefits, and alternatives for medications and treatment plan prescribed today were discussed, and the patient expressed understanding of the given instructions. Patient is instructed to call or message via MyChart if  he/she has any questions or concerns regarding our treatment plan. No barriers to understanding were identified. We discussed Red Flag symptoms and signs in detail. Patient expressed understanding regarding what to do in case of urgent or emergency type symptoms.   Medication list was reconciled, printed and provided to the patient in AVS. Patient instructions and summary information was reviewed with the patient as documented in the AVS. This note was prepared with assistance of Dragon voice recognition software. Occasional wrong-word or sound-a-like substitutions may have occurred due to the inherent limitations of voice recognition software

## 2018-11-11 ENCOUNTER — Other Ambulatory Visit: Payer: Self-pay | Admitting: Internal Medicine

## 2018-11-11 NOTE — Telephone Encounter (Signed)
Pt last saw Dr [name] 09/07/18 video visit Covid-19, last labs 11/06/18 Creat 0.95, age 73, weight 68.4kg, based on specified criteria pt is on appropriate dosage of Eliquis 5mg  BID.  Will refill rx.

## 2019-01-23 IMAGING — CT CT HEART MORPH/PULM VEIN W/ CM & W/O CA SCORE
2 of 6 series · 10 of 20 positions shown, 12 images · non-contrast
Comparison: None.

CLINICAL DATA: Atrial fibrillation scheduled for an ablation.

EXAM:
Cardiac CT/CTA
TECHNIQUE: The patient was scanned on a Siemens Somatom scanner.

[Series 6: best diast · axial · 0.39mm/px · z∈[+829,+944]mm · 5 of 434 slices shown, 7 images]
[im 73/434  vessel]
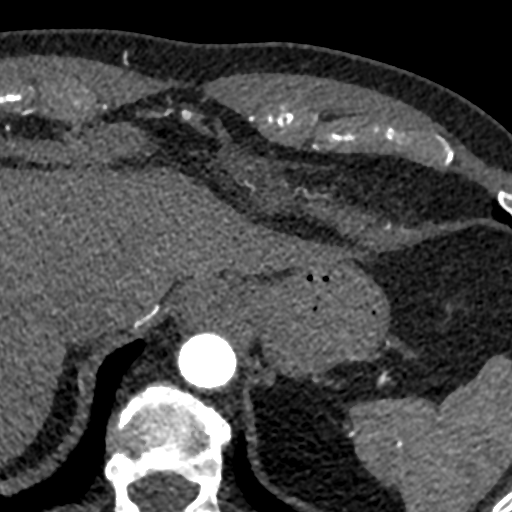
[im 73/434  lung]
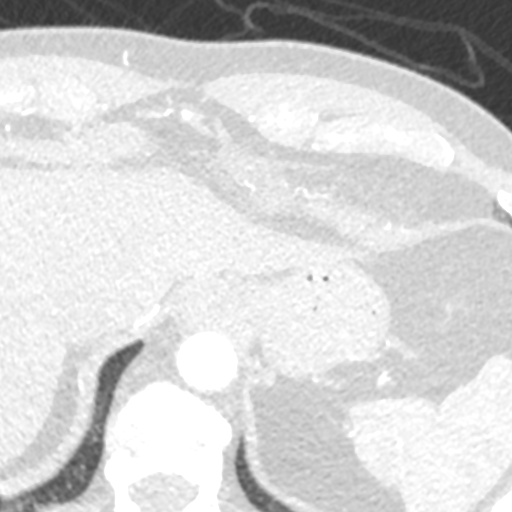
[im 145/434  vessel]
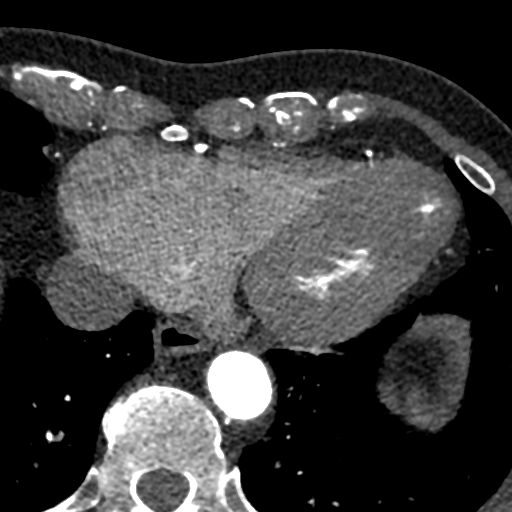
[im 217/434  vessel]
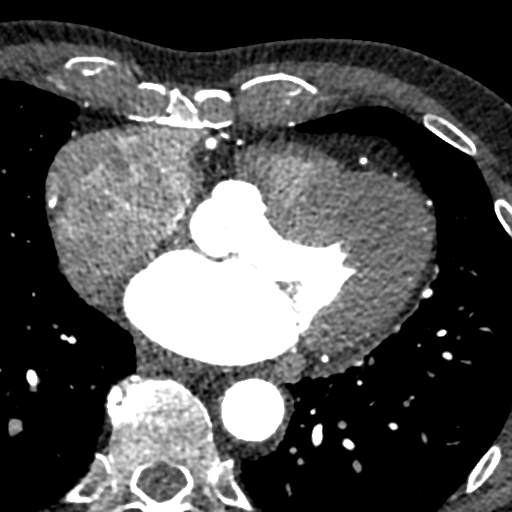
[im 289/434  vessel]
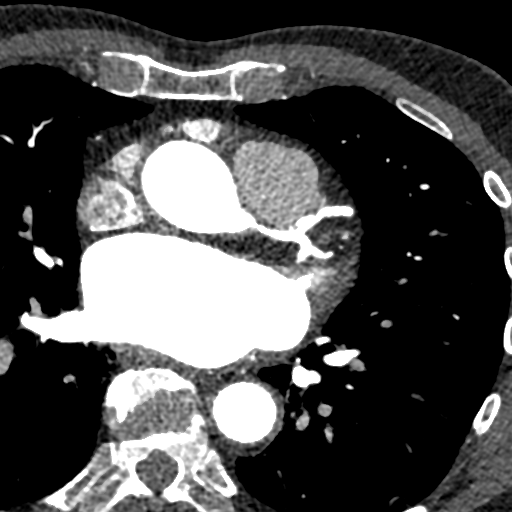
[im 361/434  vessel]
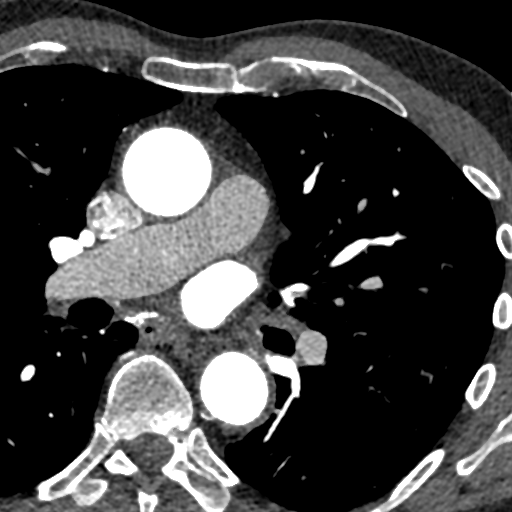
[im 361/434  lung]
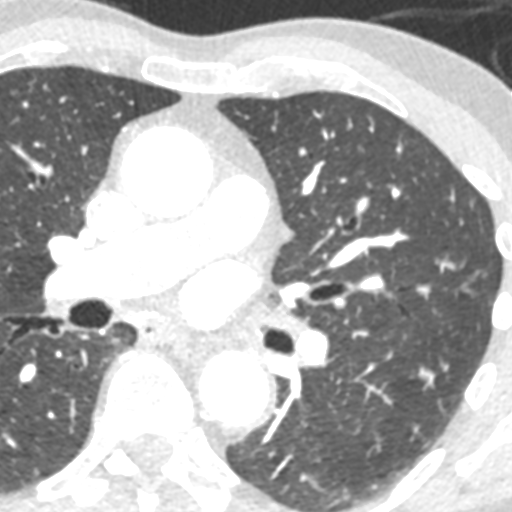

[Series 7: +300 ms · axial · 0.39mm/px · z∈[+829,+944]mm · 5 of 434 slices shown]
[im 73/434  vessel]
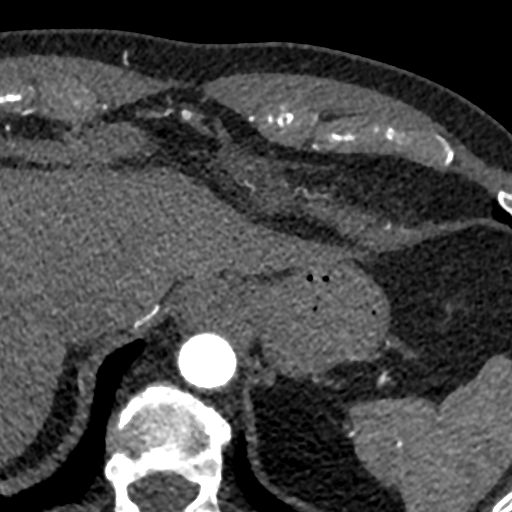
[im 145/434  vessel]
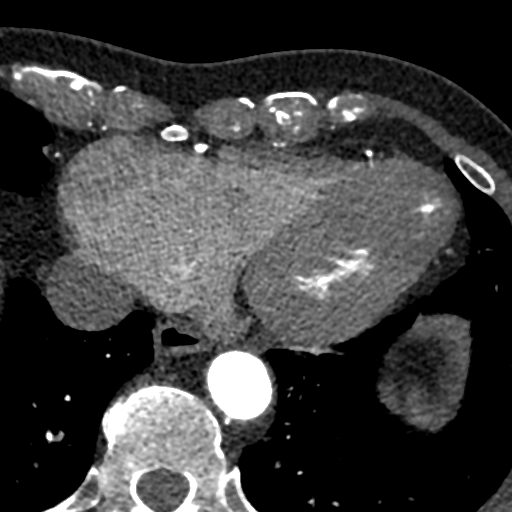
[im 217/434  vessel]
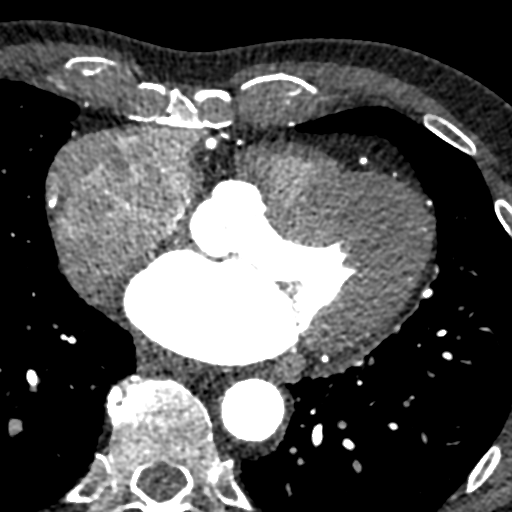
[im 289/434  vessel]
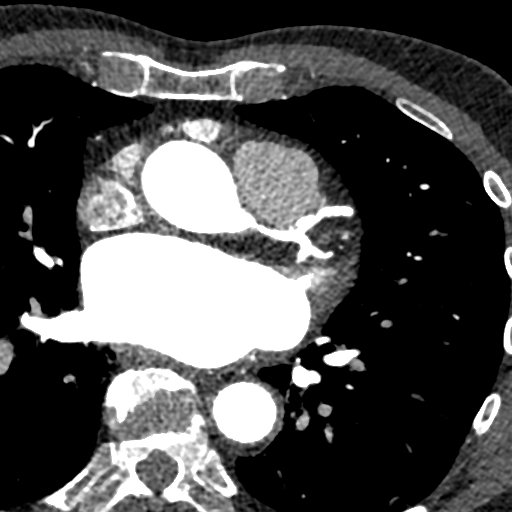
[im 361/434  vessel]
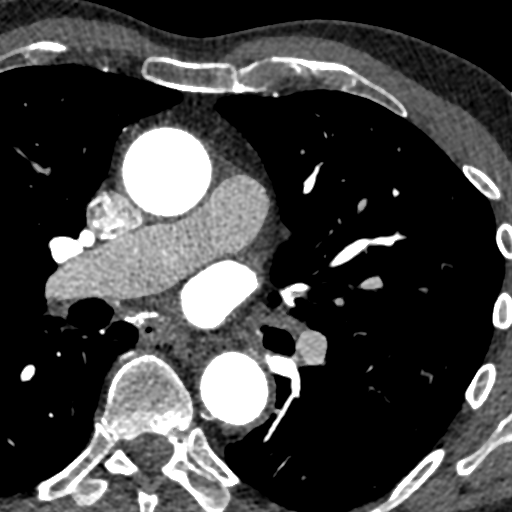

[10 of 20 positions shown; findings below may reference images not displayed]

FINDINGS: A 120 kV prospective scan was triggered in the descending thoracic
aorta at 111 HU's. Gantry rotation speed was 280 msecs and
collimation was .9 mm. No beta blockade and no NTG was given. The 3D
data set was reconstructed in 5% intervals of the 60-80 % of the R-R
cycle. Diastolic phases were analyzed on a dedicated work station
using MPR, MIP and VRT modes. The patient received 80 cc of
contrast.

There is normal pulmonary vein drainage into the left atrium (3 on
the right and 2 on the left) with ostial measurements as follows:

RUPV:  14 x 12 mm

RMPV:  7 x 7 mm

RLPV:  12 x 11 mm

LUPV:  19 x 17 mm

LLPV:  23 x 15 mm

The left atrial appendage is large with chicken wing morphology and
two lobes. Ostial size 35 x 19 mm and length 42 mm. There is no
thrombus in the left atrial appendage.

The esophagus runs to the right from the left atrial midline and is
in the proximity to the right lower pulmonary vein.

Aorta:  Normal caliber.  No dissection or calcifications.

Aortic Valve:  Trileaflet.  No calcifications.

Coronary Arteries: Normal coronary origin. Right dominance. The
study was insufficient for plaque evaluation.
IMPRESSION: 1. There is normal pulmonary vein drainage into the left atrium.

2. The left atrial appendage is large with chicken wing morphology
and two lobes. Ostial size 35 x 19 mm and length 42 mm. There is no
thrombus in the left atrial appendage.

3. The esophagus runs to the right from the left atrial midline and
is in the proximity to the right lower pulmonary vein.

[place]

EXAM:
OVER-READ INTERPRETATION  CT CHEST

The following report is an over-read performed by radiologist Dr.
Jablanko Secen [REDACTED] on 11/15/2016. This over-read
does not include interpretation of cardiac or coronary anatomy or
pathology. The coronary CTA interpretation by the cardiologist is
attached.
FINDINGS: Cardiovascular: Heart is mildly enlarged. Visualized aorta is normal
caliber.

Mediastinum/Nodes: No adenopathy in the lower mediastinum or hila.

Lungs/Pleura: No confluent airspace opacities or effusions.

Upper Abdomen: Imaging into the upper abdomen shows no acute
findings.

Musculoskeletal: Chest wall soft tissues are unremarkable. No acute
bony abnormality.
IMPRESSION: No acute extra cardiac abnormality.

Cardiomegaly.

## 2019-02-04 ENCOUNTER — Other Ambulatory Visit: Payer: Self-pay | Admitting: Internal Medicine

## 2019-02-04 DIAGNOSIS — E785 Hyperlipidemia, unspecified: Secondary | ICD-10-CM

## 2019-02-15 ENCOUNTER — Other Ambulatory Visit (HOSPITAL_COMMUNITY): Payer: Self-pay | Admitting: Nurse Practitioner

## 2019-03-08 ENCOUNTER — Other Ambulatory Visit: Payer: Self-pay | Admitting: Internal Medicine

## 2019-03-08 NOTE — Telephone Encounter (Signed)
Pt last saw Dr [name] on 09/07/18 video visit Covid-19, last labs 11/06/18 Creat 0.95, age 74, weight 68.4kg, based on specified criteria pt is on appropriate dosage of Eliquis 5mg  BID. Will refill rx.

## 2019-03-11 ENCOUNTER — Ambulatory Visit: Payer: Medicare PPO | Admitting: Internal Medicine

## 2019-03-15 ENCOUNTER — Ambulatory Visit: Payer: Medicare PPO | Admitting: Internal Medicine

## 2019-03-22 ENCOUNTER — Encounter: Payer: Self-pay | Admitting: Internal Medicine

## 2019-03-22 ENCOUNTER — Ambulatory Visit: Payer: Medicare PPO | Admitting: Internal Medicine

## 2019-03-22 ENCOUNTER — Other Ambulatory Visit: Payer: Self-pay

## 2019-03-22 VITALS — BP 140/76 | HR 65 | Ht 66.0 in | Wt 155.8 lb

## 2019-03-22 DIAGNOSIS — I421 Obstructive hypertrophic cardiomyopathy: Secondary | ICD-10-CM | POA: Diagnosis not present

## 2019-03-22 DIAGNOSIS — I422 Other hypertrophic cardiomyopathy: Secondary | ICD-10-CM | POA: Diagnosis not present

## 2019-03-22 DIAGNOSIS — I4819 Other persistent atrial fibrillation: Secondary | ICD-10-CM

## 2019-03-22 NOTE — Progress Notes (Signed)
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  Since last being seen in our clinic, the patient reports doing very well.  Today, he denies symptoms of palpitations, chest pain, shortness of breath,  lower extremity edema, dizziness, presyncope, or syncope.  The patient is otherwise without complaint today.   Past Medical History:  Diagnosis Date  . Current use of long term anticoagulation 11/06/2018  . DDD (degenerative disc disease)   . HLD (hyperlipidemia)   . 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 Persistent atrial fibrillation ([place])    s/p PVI 3/12   Past Surgical History:  Procedure Laterality Date  . APPENDECTOMY    . atrial fibrillation ablation  03/27/10   PVI with CTI ablation by JA  . ATRIAL FIBRILLATION ABLATION N/A 11/21/2016   Procedure: ATRIAL FIBRILLATION ABLATION;  Surgeon: [name], MD;  Location: [place] [facility];  Service: Cardiovascular;  Laterality: N/A;  . CARDIOVERSION    . CARDIOVERSION  04/26/2011   Procedure: CARDIOVERSION;  Surgeon: Larey Dresser, MD;  Location: [place];  Service: Cardiovascular;  Laterality: N/A;  . CARDIOVERSION N/A 04/28/2012   Procedure: CARDIOVERSION;  Surgeon: Thayer Headings, MD;  Location: [place];  Service: Cardiovascular;  Laterality: N/A;  . CARDIOVERSION N/A 07/28/2012   Procedure: CARDIOVERSION;  Surgeon: Lelon Perla, MD;  Location: [facility] ENDOSCOPY;  Service: Cardiovascular;  Laterality: N/A;  . CARDIOVERSION N/A 08/14/2012   Procedure: CARDIOVERSION;  Surgeon: Josue Hector, MD;  Location: [place];  Service: Cardiovascular;  Laterality: N/A;  . CARDIOVERSION N/A 08/11/2014   Procedure: CARDIOVERSION;  Surgeon: Fay Records, MD;  Location: [place];  Service: Cardiovascular;  Laterality: N/A;  . CARDIOVERSION N/A 08/26/2014   Procedure: CARDIOVERSION;  Surgeon: Sanda Klein, MD;   Location: [facility] ENDOSCOPY;  Service: Cardiovascular;  Laterality: N/A;  . CARDIOVERSION N/A 10/10/2016   Procedure: CARDIOVERSION;  Surgeon: Larey Dresser, MD;  Location: [facility] ENDOSCOPY;  Service: Cardiovascular;  Laterality: N/A;  . CARDIOVERSION N/A 12/04/2016   Procedure: CARDIOVERSION;  Surgeon: Larey Dresser, MD;  Location: [facility] ENDOSCOPY;  Service: Cardiovascular;  Laterality: N/A;  . CARDIOVERSION N/A 06/09/2018   Procedure: CARDIOVERSION;  Surgeon: Dorothy Spark, MD;  Location: [place];  Service: Cardiovascular;  Laterality: N/A;  . SKIN CANCER EXCISION    . TEE WITHOUT CARDIOVERSION  04/26/2011   Procedure: TRANSESOPHAGEAL ECHOCARDIOGRAM (TEE);  Surgeon: Larey Dresser, MD;  Location: [facility] ENDOSCOPY;  Service: Cardiovascular;  Laterality: N/A;  to be done at 1330  . TEE WITHOUT CARDIOVERSION N/A 04/28/2012   Procedure: TRANSESOPHAGEAL ECHOCARDIOGRAM (TEE);  Surgeon: Thayer Headings, MD;  Location: [place];  Service: Cardiovascular;  Laterality: N/A;  . WISDOM TOOTH EXTRACTION      ROS- all systems are reviewed and negatives except as per HPI above  Current Outpatient Medications  Medication Sig Dispense Refill  . atorvastatin (LIPITOR) 10 MG tablet TAKE 1 TABLET BY MOUTH EVERY DAY 90 tablet 2  . calcium carbonate (TUMS - DOSED IN MG ELEMENTAL CALCIUM) 500 MG chewable tablet Chew 1 tablet by mouth daily as needed for indigestion or heartburn.    . disopyramide (NORPACE CR) 150 MG 12 hr capsule Take 1 capsule (150 mg total) by mouth 2 (two) times daily. 60 capsule 12  . ELIQUIS 5 MG TABS tablet TAKE 1 TABLET BY MOUTH TWICE A DAY 180  tablet 1  . fluticasone (CUTIVATE) 0.005 % ointment Apply 1 application topically 2 (two) times daily as needed (for skin rash/irritation.).     Marland Kitchen ibuprofen (ADVIL,MOTRIN) 200 MG tablet Take 400 mg by mouth every 8 (eight) hours as needed (for pain.).    Marland Kitchen Melatonin 10-10 MG TBCR Take 1 tablet by mouth daily.    . metoprolol tartrate (LOPRESSOR) 25  MG tablet TAKE 1 TABLET BY MOUTH TWICE A DAY 180 tablet 2  . Multiple Vitamin (MULTIVITAMIN WITH MINERALS) TABS tablet Take 1 tablet by mouth 3 (three) times a week.    . sildenafil (VIAGRA) 25 MG tablet Take 25 mg by mouth as needed for erectile dysfunction.      No current facility-administered medications for this visit.    Physical Exam: Vitals:   03/22/19 1533  BP: 140/76  Pulse: 65  SpO2: 97%  Weight: 155 lb 12.8 oz (70.7 kg)  Height: 5\' 6"  (1.676 m)    GEN- The patient is well appearing, alert and oriented x 3 today.   Head- normocephalic, atraumatic Eyes-  Sclera clear, conjunctiva pink Ears- hearing intact Oropharynx- clear Lungs- Clear to ausculation bilaterally, normal work of breathing Heart- Regular rate and rhythm, no murmurs, rubs or gallops, PMI not laterally displaced GI- soft, NT, ND, + BS Extremities- no clubbing, cyanosis, or edema  Wt Readings from Last 3 Encounters:  03/22/19 155 lb 12.8 oz (70.7 kg)  11/06/18 150 lb 12.8 oz (68.4 kg)  09/07/18 145 lb (65.8 kg)    EKG tracing ordered today is personally reviewed and shows sinus rhythm with PACs, LVH with repolarization abnormality  Assessment and Plan:  1. Persistent afib/ atrial flutter Remains in sinus No changes Continue norpace and anticoagulation  2. Hypertrophic nonobstructive CM Stable No change required today  Return to see EP PA every 6 months  [name] MD, [facility] 03/22/2019 3:58 PM

## 2019-03-22 NOTE — Patient Instructions (Addendum)
Medication Instructions:  Your physician recommends that you continue on your current medications as directed. Please refer to the Current Medication list given to you today.  Labwork: None ordered.  Testing/Procedures: None ordered.  Follow-Up: Your physician wants you to follow-up in: 6 months with Tommye Standard, PA-IN PERSON.   You will receive a reminder letter in the mail two months in advance. If you don't receive a letter, please call our office to schedule the follow-up appointment.  Any Other Special Instructions Will Be Listed Below (If Applicable).  If you need a refill on your cardiac medications before your next appointment, please call your pharmacy.

## 2019-04-15 ENCOUNTER — Other Ambulatory Visit: Payer: Self-pay

## 2019-04-15 ENCOUNTER — Ambulatory Visit (INDEPENDENT_AMBULATORY_CARE_PROVIDER_SITE_OTHER): Payer: Medicare PPO

## 2019-04-15 DIAGNOSIS — Z0000 Encounter for general adult medical examination without abnormal findings: Secondary | ICD-10-CM

## 2019-04-15 NOTE — Progress Notes (Signed)
This visit is being conducted via phone call due to the COVID-19 pandemic. This patient has given me verbal consent via phone to conduct this visit, patient states they are participating from their home address. Some vital signs may be absent or patient reported.   Patient identification: identified by name, DOB, and current address.  Location provider: [facility], Office Persons participating in the virtual visit: Denman George LPN, patient, and Dr. Billey Chang    Subjective:   Ronnie Black is a 74 y.o. male who presents for Medicare Annual/Subsequent preventive examination.  Review of Systems:   Cardiac Risk Factors include: advanced age (>83men, >30 women);male gender;dyslipidemia\    Objective:    Vitals: There were no vitals taken for this visit.  There is no height or weight on file to calculate BMI.  Advanced Directives 04/15/2019 06/09/2018 11/26/2017 12/04/2016 11/21/2016 11/12/2016 10/10/2016  Does Patient Have a Medical Advance Directive? Yes Yes No;Yes Yes Yes Yes Yes  Type of Advance Directive Living will;Healthcare Power of [place];Living will [place];Living will;Out of facility DNR (pink MOST or yellow form) [place];Living will [place];Living will [place];Living will Living will;Healthcare Power of Attorney  Does patient want to make changes to medical advance directive? No - Patient declined - No - Patient declined - No - Patient declined - -  Copy of [place] in Chart? No - copy requested No - copy requested No - copy requested No - copy requested No - copy requested No - copy requested No - copy requested  Would patient like information on creating a medical advance directive? - - No - Patient declined - - - -  Pre-existing out of facility DNR order (yellow form or pink MOST form) - - - - - - -    Tobacco Social History   Tobacco  Use  Smoking Status Former Smoker  . Packs/day: 1.00  . Years: 15.00  . Pack years: 15.00  . Types: Cigarettes  . Quit date: 11/28/1975  . Years since quitting: 43.4  Smokeless Tobacco Never Used     Counseling given: Not Answered   Clinical Intake:  Pre-visit preparation completed: Yes  Pain : No/denies pain  Diabetes: No  How often do you need to have someone help you when you read instructions, pamphlets, or other written materials from your doctor or pharmacy?: 1 - Never  Interpreter Needed?: No  Information entered by :: Denman George LPN  Past Medical History:  Diagnosis Date  . Current use of long term anticoagulation 11/06/2018  . DDD (degenerative disc disease)   . HLD (hyperlipidemia)   . 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 Persistent atrial fibrillation ([place])    s/p PVI 3/12   Past Surgical History:  Procedure Laterality Date  . APPENDECTOMY    . atrial fibrillation ablation  03/27/10   PVI with CTI ablation by JA  . ATRIAL FIBRILLATION ABLATION N/A 11/21/2016   Procedure: ATRIAL FIBRILLATION ABLATION;  Surgeon: [name], MD;  Location: [place] [facility];  Service: Cardiovascular;  Laterality: N/A;  . CARDIOVERSION    . CARDIOVERSION  04/26/2011   Procedure: CARDIOVERSION;  Surgeon: Larey Dresser, MD;  Location: [facility] ENDOSCOPY;  Service: Cardiovascular;  Laterality: N/A;  . CARDIOVERSION N/A 04/28/2012   Procedure: CARDIOVERSION;  Surgeon: Thayer Headings, MD;  Location: [place];  Service: Cardiovascular;  Laterality: N/A;  .  CARDIOVERSION N/A 07/28/2012   Procedure: CARDIOVERSION;  Surgeon: Lelon Perla, MD;  Location: [facility] ENDOSCOPY;  Service: Cardiovascular;  Laterality: N/A;  . CARDIOVERSION N/A 08/14/2012   Procedure: CARDIOVERSION;  Surgeon: Josue Hector, MD;  Location: [place];  Service: Cardiovascular;  Laterality: N/A;  . CARDIOVERSION N/A 08/11/2014   Procedure: CARDIOVERSION;  Surgeon: Fay Records, MD;   Location: [place];  Service: Cardiovascular;  Laterality: N/A;  . CARDIOVERSION N/A 08/26/2014   Procedure: CARDIOVERSION;  Surgeon: Sanda Klein, MD;  Location: [facility] ENDOSCOPY;  Service: Cardiovascular;  Laterality: N/A;  . CARDIOVERSION N/A 10/10/2016   Procedure: CARDIOVERSION;  Surgeon: Larey Dresser, MD;  Location: [facility] ENDOSCOPY;  Service: Cardiovascular;  Laterality: N/A;  . CARDIOVERSION N/A 12/04/2016   Procedure: CARDIOVERSION;  Surgeon: Larey Dresser, MD;  Location: [facility] ENDOSCOPY;  Service: Cardiovascular;  Laterality: N/A;  . CARDIOVERSION N/A 06/09/2018   Procedure: CARDIOVERSION;  Surgeon: Dorothy Spark, MD;  Location: [place];  Service: Cardiovascular;  Laterality: N/A;  . SKIN CANCER EXCISION    . TEE WITHOUT CARDIOVERSION  04/26/2011   Procedure: TRANSESOPHAGEAL ECHOCARDIOGRAM (TEE);  Surgeon: Larey Dresser, MD;  Location: [facility] ENDOSCOPY;  Service: Cardiovascular;  Laterality: N/A;  to be done at 1330  . TEE WITHOUT CARDIOVERSION N/A 04/28/2012   Procedure: TRANSESOPHAGEAL ECHOCARDIOGRAM (TEE);  Surgeon: Thayer Headings, MD;  Location: [facility] ENDOSCOPY;  Service: Cardiovascular;  Laterality: N/A;  . WISDOM TOOTH EXTRACTION     Family History  Problem Relation Age of Onset  . Coronary artery disease Other   . Colon cancer Mother   . Lung cancer Mother   . Colon cancer Father   . Early death Neg Hx   . Hearing loss Neg Hx   . Heart disease Neg Hx   . Hyperlipidemia Neg Hx   . Hypertension Neg Hx   . Kidney disease Neg Hx   . Stroke Neg Hx    Social History   Socioeconomic History  . Marital status: Married    Spouse name: Not on file  . Number of children: Not on file  . Years of education: Not on file  . Highest education level: Not on file  Occupational History  . Occupation: professor    Employer: [employer]  Tobacco Use  . Smoking status: Former Smoker    Packs/day: 1.00    Years: 15.00    Pack years: 15.00    Types: Cigarettes    Quit date:  11/28/1975    Years since quitting: 43.4  . Smokeless tobacco: Never Used  Substance and Sexual Activity  . Alcohol use: Yes    Alcohol/week: 6.0 standard drinks    Types: 3 Glasses of wine, 3 Cans of beer per week    Comment: rare wine  . Drug use: No  . Sexual activity: Yes  Other Topics Concern  . Not on file  Social History Narrative    a professor of Therapist, occupational at [employer] in [place] @ [place] Strain:   . Difficulty of Paying Living Expenses:   Food Insecurity:   . Worried About Charity fundraiser in the Last Year:   . Arboriculturist in the Last Year:   Transportation Needs:   . Film/video editor (Medical):   Marland Kitchen Lack of Transportation (Non-Medical):   Physical Activity:   . Days of Exercise per Week:   .  Minutes of Exercise per Session:   Stress:   . Feeling of Stress :   Social Connections:   . Frequency of Communication with Friends and Family:   . Frequency of Social Gatherings with Friends and Family:   . Attends Religious Services:   . Active Member of Clubs or Organizations:   . Attends Archivist Meetings:   Marland Kitchen Marital Status:     Outpatient Encounter Medications as of 04/15/2019  Medication Sig  . atorvastatin (LIPITOR) 10 MG tablet TAKE 1 TABLET BY MOUTH EVERY DAY  . calcium carbonate (TUMS - DOSED IN MG ELEMENTAL CALCIUM) 500 MG chewable tablet Chew 1 tablet by mouth daily as needed for indigestion or heartburn.  . disopyramide (NORPACE CR) 150 MG 12 hr capsule Take 1 capsule (150 mg total) by mouth 2 (two) times daily.  Marland Kitchen ELIQUIS 5 MG TABS tablet TAKE 1 TABLET BY MOUTH TWICE A DAY  . fluticasone (CUTIVATE) 0.005 % ointment Apply 1 application topically 2 (two) times daily as needed (for skin rash/irritation.).   Marland Kitchen ibuprofen (ADVIL,MOTRIN) 200 MG tablet Take 400 mg by mouth every 8 (eight) hours as needed (for pain.).  Marland Kitchen Melatonin 10-10 MG TBCR Take  1 tablet by mouth daily.  . metoprolol tartrate (LOPRESSOR) 25 MG tablet TAKE 1 TABLET BY MOUTH TWICE A DAY  . Multiple Vitamin (MULTIVITAMIN WITH MINERALS) TABS tablet Take 1 tablet by mouth 3 (three) times a week.  . sildenafil (VIAGRA) 25 MG tablet Take 25 mg by mouth as needed for erectile dysfunction.    No facility-administered encounter medications on file as of 04/15/2019.    Activities of Daily Living In your present state of health, do you have any difficulty performing the following activities: 04/15/2019 11/06/2018  Hearing? N N  Vision? N N  Difficulty concentrating or making decisions? N N  Walking or climbing stairs? N N  Dressing or bathing? N N  Doing errands, shopping? N N  Preparing Food and eating ? N -  Using the Toilet? N -  In the past six months, have you accidently leaked urine? N -  Do you have problems with loss of bowel control? N -  Managing your Medications? N -  Managing your Finances? N -  Housekeeping or managing your Housekeeping? N -  Some recent data might be hidden    Patient Care Team: [name], MD as PCP - General (Family Medicine) [name], MD (Cardiology) [place], [facility] as Consulting Physician (Urology) Laurin Coder, MD as Consulting Physician (Pulmonary Disease)   Assessment:   This is a routine wellness examination for [place].  Exercise Activities and Dietary recommendations Current Exercise Habits: Home exercise routine;Structured exercise class, Type of exercise: walking;yoga;Other - see comments(running, biking), Time (Minutes): 60, Frequency (Times/Week): 5, Weekly Exercise (Minutes/Week): 300, Intensity: Moderate  Goals   None     Fall Risk Fall Risk  04/15/2019 11/06/2018 11/16/2017 11/16/2017 11/13/2017  Falls in the past year? 0 0 No No No  Number falls in past yr: 0 0 - - -  Injury with Fall? 0 0 - - -  Follow up Falls evaluation completed;Education provided;Falls prevention discussed  Falls evaluation completed - - -   Is the patient's home free of loose throw rugs in walkways, pet beds, electrical cords, etc?   yes      Grab bars in the bathroom? yes      Handrails on the stairs?   yes  Adequate lighting?   yes  Depression Screen PHQ 2/9 Scores 04/15/2019 11/06/2018 11/16/2017 11/16/2017  PHQ - 2 Score 0 0 0 0  PHQ- 9 Score - - - -    Cognitive Function   6CIT Screen 04/15/2019  What Year? 0 points  What month? 0 points  What time? 0 points  Count back from 20 0 points  Months in reverse 0 points  Repeat phrase 0 points  Total Score 0    Immunization History  Administered Date(s) Administered  . Influenza, High Dose Seasonal PF 11/07/2016, 11/13/2017  . Influenza, Seasonal, Injecte, Preservative Fre 11/04/2013  . Pneumococcal Conjugate-13 12/02/2013  . Pneumococcal Polysaccharide-23 01/07/2012  . Tdap 11/07/2016  . Zoster 01/22/2010  . Zoster Recombinat (Shingrix) 12/04/2018    Qualifies for Shingles Vaccine?  Patient has had 1st vaccine and has plans to complete series within 6 month period.   Screening Tests Health Maintenance  Topic Date Due  . TETANUS/TDAP  11/08/2026  . COLONOSCOPY  05/07/2027  . INFLUENZA VACCINE  Completed  . Hepatitis C Screening  Completed  . PNA vac Low Risk Adult  Completed   Cancer Screenings: Lung: Low Dose CT Chest recommended if Age 29-80 years, 30 pack-year currently smoking OR have quit w/in 15years. Patient does not qualify. Colorectal: colonoscopy 05/06/17       Plan:  I have personally reviewed and addressed the Medicare Annual Wellness questionnaire and have noted the following in the patient's chart:  A. Medical and social history B. Use of alcohol, tobacco or illicit drugs  C. Current medications and supplements D. Functional ability and status E.  Nutritional status F.  Physical activity G. Advance directives H. List of other physicians I.  Hospitalizations, surgeries, and ER visits in  previous 12 months J.  [place] such as hearing and vision if needed, cognitive and depression L. Referrals, records requested, and appointments- none   In addition, I have reviewed and discussed with patient certain preventive protocols, quality metrics, and best practice recommendations. A written personalized care plan for preventive services as well as general preventive health recommendations were provided to patient.   Signed,  Denman George, LPN  Nurse Health Advisor   Nurse Notes: no additional

## 2019-04-15 NOTE — Patient Instructions (Signed)
Ronnie Black , Thank you for taking time to come for your Medicare Wellness Visit. I appreciate your ongoing commitment to your health goals. Please review the following plan we discussed and let me know if I can assist you in the future.   Screening recommendations/referrals: Colorectal Screening: up to date; last colonoscopy 05/06/17  Vision and Dental Exams: Recommended annual ophthalmology exams for early detection of glaucoma and other disorders of the eye Recommended annual dental exams for proper oral hygiene  Vaccinations: Influenza vaccine: completed 10/30/18 Pneumococcal vaccine: up to date; last 12/02/13 Tdap vaccine: up to date; last 11/07/16 Shingles vaccine: 1st vaccine 12/04/18;  2nd due by 06/03/19  Advanced directives: Please bring a copy of your POA (Power of [place]) and/or Living Will to your next appointment.  Goals: Recommend to drink at least 6-8 8oz glasses of water per day and consume a balanced diet rich in fresh fruits and vegetables.   Next appointment: Please schedule your Annual Wellness Visit with your Nurse Health Advisor in one year.  Preventive Care 76 Years and Older, Male Preventive care refers to lifestyle choices and visits with your health care provider that can promote health and wellness. What does preventive care include?  A yearly physical exam. This is also called an annual well check.  Dental exams once or twice a year.  Routine eye exams. Ask your health care provider how often you should have your eyes checked.  Personal lifestyle choices, including:  Daily care of your teeth and gums.  Regular physical activity.  Eating a healthy diet.  Avoiding tobacco and drug use.  Limiting alcohol use.  Practicing safe sex.  Taking low doses of aspirin every day if recommended by your health care provider..  Taking vitamin and mineral supplements as recommended by your health care provider. What happens during an annual well check? The  services and screenings done by your health care provider during your annual well check will depend on your age, overall health, lifestyle risk factors, and family history of disease. Counseling  Your health care provider may ask you questions about your:  Alcohol use.  Tobacco use.  Drug use.  Emotional well-being.  Home and relationship well-being.  Sexual activity.  Eating habits.  History of falls.  Memory and ability to understand (cognition).  Work and work Statistician. Screening  You may have the following tests or measurements:  Height, weight, and BMI.  Blood pressure.  Lipid and cholesterol levels. These may be checked every 5 years, or more frequently if you are over 38 years old.  Skin check.  Lung cancer screening. You may have this screening every year starting at age 98 if you have a 30-pack-year history of smoking and currently smoke or have quit within the past 15 years.  Fecal occult blood test (FOBT) of the stool. You may have this test every year starting at age 90.  Flexible sigmoidoscopy or colonoscopy. You may have a sigmoidoscopy every 5 years or a colonoscopy every 10 years starting at age 59.  Prostate cancer screening. Recommendations will vary depending on your family history and other risks.  Hepatitis C blood test.  Hepatitis B blood test.  Sexually transmitted disease (STD) testing.  Diabetes screening. This is done by checking your blood sugar (glucose) after you have not eaten for a while (fasting). You may have this done every 1-3 years.  Abdominal aortic aneurysm (AAA) screening. You may need this if you are a current or former smoker.  Osteoporosis. You  may be screened starting at age 24 if you are at high risk. Talk with your health care provider about your test results, treatment options, and if necessary, the need for more tests. Vaccines  Your health care provider may recommend certain vaccines, such as:  Influenza  vaccine. This is recommended every year.  Tetanus, diphtheria, and acellular pertussis (Tdap, Td) vaccine. You may need a Td booster every 10 years.  Zoster vaccine. You may need this after age 19.  Pneumococcal 13-valent conjugate (PCV13) vaccine. One dose is recommended after age 13.  Pneumococcal polysaccharide (PPSV23) vaccine. One dose is recommended after age 63. Talk to your health care provider about which screenings and vaccines you need and how often you need them. This information is not intended to replace advice given to you by your health care provider. Make sure you discuss any questions you have with your health care provider. Document Released: 02/03/2015 Document Revised: 09/27/2015 Document Reviewed: 11/08/2014 Elsevier Interactive Patient Education  2017 [place] Prevention in the Home Falls can cause injuries. They can happen to people of all ages. There are many things you can do to make your home safe and to help prevent falls. What can I do on the outside of my home?  Regularly fix the edges of walkways and driveways and fix any cracks.  Remove anything that might make you trip as you walk through a door, such as a raised step or threshold.  Trim any bushes or trees on the path to your home.  Use bright outdoor lighting.  Clear any walking paths of anything that might make someone trip, such as rocks or tools.  Regularly check to see if handrails are loose or broken. Make sure that both sides of any steps have handrails.  Any raised decks and porches should have guardrails on the edges.  Have any leaves, snow, or ice cleared regularly.  Use sand or salt on walking paths during winter.  Clean up any spills in your garage right away. This includes oil or grease spills. What can I do in the bathroom?  Use night lights.  Install grab bars by the toilet and in the tub and shower. Do not use towel bars as grab bars.  Use non-skid mats or decals  in the tub or shower.  If you need to sit down in the shower, use a plastic, non-slip stool.  Keep the floor dry. Clean up any water that spills on the floor as soon as it happens.  Remove soap buildup in the tub or shower regularly.  Attach bath mats securely with double-sided non-slip rug tape.  Do not have throw rugs and other things on the floor that can make you trip. What can I do in the bedroom?  Use night lights.  Make sure that you have a light by your bed that is easy to reach.  Do not use any sheets or blankets that are too big for your bed. They should not hang down onto the floor.  Have a firm chair that has side arms. You can use this for support while you get dressed.  Do not have throw rugs and other things on the floor that can make you trip. What can I do in the kitchen?  Clean up any spills right away.  Avoid walking on wet floors.  Keep items that you use a lot in easy-to-reach places.  If you need to reach something above you, use a strong step stool that  has a grab bar.  Keep electrical cords out of the way.  Do not use floor polish or wax that makes floors slippery. If you must use wax, use non-skid floor wax.  Do not have throw rugs and other things on the floor that can make you trip. What can I do with my stairs?  Do not leave any items on the stairs.  Make sure that there are handrails on both sides of the stairs and use them. Fix handrails that are broken or loose. Make sure that handrails are as long as the stairways.  Check any carpeting to make sure that it is firmly attached to the stairs. Fix any carpet that is loose or worn.  Avoid having throw rugs at the top or bottom of the stairs. If you do have throw rugs, attach them to the floor with carpet tape.  Make sure that you have a light switch at the top of the stairs and the bottom of the stairs. If you do not have them, ask someone to add them for you. What else can I do to help  prevent falls?  Wear shoes that:  Do not have high heels.  Have rubber bottoms.  Are comfortable and fit you well.  Are closed at the toe. Do not wear sandals.  If you use a stepladder:  Make sure that it is fully opened. Do not climb a closed stepladder.  Make sure that both sides of the stepladder are locked into place.  Ask someone to hold it for you, if possible.  Clearly mark and make sure that you can see:  Any grab bars or handrails.  First and last steps.  Where the edge of each step is.  Use tools that help you move around (mobility aids) if they are needed. These include:  Canes.  Walkers.  Scooters.  Crutches.  Turn on the lights when you go into a dark area. Replace any light bulbs as soon as they burn out.  Set up your furniture so you have a clear path. Avoid moving your furniture around.  If any of your floors are uneven, fix them.  If there are any pets around you, be aware of where they are.  Review your medicines with your doctor. Some medicines can make you feel dizzy. This can increase your chance of falling. Ask your doctor what other things that you can do to help prevent falls. This information is not intended to replace advice given to you by your health care provider. Make sure you discuss any questions you have with your health care provider. Document Released: 11/03/2008 Document Revised: 06/15/2015 Document Reviewed: 02/11/2014 Elsevier Interactive Patient Education  2017 [employer].

## 2019-04-26 ENCOUNTER — Other Ambulatory Visit: Payer: Self-pay | Admitting: Internal Medicine

## 2019-06-09 ENCOUNTER — Telehealth: Payer: Self-pay | Admitting: Internal Medicine

## 2019-06-09 NOTE — Telephone Encounter (Signed)
Letter of medical necessity has been emailed to [email or web address] for Norpace CR 150mg  BID to [facility] at [address].

## 2019-06-09 NOTE — Telephone Encounter (Signed)
Pt c/o medication issue:  1. Name of Medication: NORPACE CR 150 MG 12 hr capsule  2. How are you currently taking this medication (dosage and times per day)? 1 tablet twice a day  3. Are you having a reaction (difficulty breathing--STAT)? no  4. What is your medication issue? Summer from [facility] calling stating the medication is on short supply and the supplier requires a letter of medical necessity to ship it to them.  She states it can be sent to the email [email or web address]

## 2019-06-10 NOTE — Telephone Encounter (Signed)
Received message back from [place] that Norpace CR is completely out of stock and will not be available until September.  Could change pt to immediate release formulation, however this needs to be given 4x a day. Pt also takes a total of 300mg  daily and the IR formulation only comes in 100mg  and 150mg  capsules so his dose would need to be changed.  Will route to MD for input on antiarrhythmic therapy.

## 2019-07-13 ENCOUNTER — Encounter: Payer: Self-pay | Admitting: Family Medicine

## 2019-07-13 ENCOUNTER — Other Ambulatory Visit: Payer: Self-pay

## 2019-07-13 ENCOUNTER — Ambulatory Visit (INDEPENDENT_AMBULATORY_CARE_PROVIDER_SITE_OTHER): Payer: Medicare PPO | Admitting: Family Medicine

## 2019-07-13 VITALS — BP 130/70 | HR 65 | Temp 97.3°F | Ht 66.0 in | Wt 154.0 lb

## 2019-07-13 DIAGNOSIS — B351 Tinea unguium: Secondary | ICD-10-CM | POA: Diagnosis not present

## 2019-07-13 DIAGNOSIS — L729 Follicular cyst of the skin and subcutaneous tissue, unspecified: Secondary | ICD-10-CM

## 2019-07-13 NOTE — Progress Notes (Signed)
Subjective  CC:  Chief Complaint  Patient presents with  . Nail Problem    Right great toe is starting to fall off  . Nodule on left knee    x several months    HPI: Ronnie Black is a 74 y.o. male who presents to the office today to address the problems listed above in the chief complaint.  Right great toenail has lifted. Noted discoloration and thickening. Has same on left great toenail. No pain or bleeding. No trauma or injury. Keeps catching on things.  Noted small mobile nodule over left knee cap several months ago. No pain, redness, drainage or irritation. No trauma.    Assessment  1. Onychomycosis   2. Cyst of skin      Plan      Toenail fungus:  Removed lifted toenail (partial). Educated. No treatments as risks are > benefits due to anticoagulated patient. Toenail care discussed.  Small benign cyst of skin. No treatment warranted. Reassured. F/u if becomes large, painful, red or drains.   Follow up:    Visit date not found  No orders of the defined types were placed in this encounter.  No orders of the defined types were placed in this encounter.     I reviewed the patients updated PMH, FH, and SocHx.    Patient Active Problem List   Diagnosis Date Noted  . Current use of long term anticoagulation 11/06/2018    Priority: High  . Atrial fibrillation status post cardioversion [facility]) 02/24/2017    Priority: High  . Mixed hyperlipidemia 08/13/2008    Priority: High  . HOCM (hypertrophic obstructive cardiomyopathy) ([place]) 08/13/2008    Priority: High  . Benign prostatic hyperplasia without lower urinary tract symptoms 11/07/2016    Priority: Medium  . Herniated lumbar disc without myelopathy 07/08/2012    Priority: Medium  . ED (erectile dysfunction) 01/07/2012    Priority: Low  . Actinic keratosis 01/07/2012    Priority: Low  . Seborrheic keratosis 01/07/2012    Priority: Low  . CAROTID BRUIT 08/24/2008    Priority: Low  . Persistent atrial  fibrillation ([place]) 11/21/2016   Current Meds  Medication Sig  . atorvastatin (LIPITOR) 10 MG tablet TAKE 1 TABLET BY MOUTH EVERY DAY  . calcium carbonate (TUMS - DOSED IN MG ELEMENTAL CALCIUM) 500 MG chewable tablet Chew 1 tablet by mouth daily as needed for indigestion or heartburn.  Marland Kitchen ELIQUIS 5 MG TABS tablet TAKE 1 TABLET BY MOUTH TWICE A DAY  . fluticasone (CUTIVATE) 0.005 % ointment Apply 1 application topically 2 (two) times daily as needed (for skin rash/irritation.).   Marland Kitchen ibuprofen (ADVIL,MOTRIN) 200 MG tablet Take 400 mg by mouth every 8 (eight) hours as needed (for pain.).  Marland Kitchen Melatonin 10-10 MG TBCR Take 1 tablet by mouth daily.  . metoprolol tartrate (LOPRESSOR) 25 MG tablet TAKE 1 TABLET BY MOUTH TWICE A DAY  . Multiple Vitamin (MULTIVITAMIN WITH MINERALS) TABS tablet Take 1 tablet by mouth 3 (three) times a week.  . NORPACE CR 150 MG 12 hr capsule TAKE 1 CAPSULE BY MOUTH TWICE A DAY  . sildenafil (VIAGRA) 25 MG tablet Take 25 mg by mouth as needed for erectile dysfunction.     Allergies: Patient has No Known Allergies. Family History: Patient family history includes Colon cancer in his father and mother; Coronary artery disease in an other family member; Lung cancer in his mother. Social History:  Patient  reports that he quit smoking about 54  years ago. His smoking use included cigarettes. He has a 15.00 pack-year smoking history. He has never used smokeless tobacco. He reports current alcohol use of about 6.0 standard drinks of alcohol per week. He reports that he does not use drugs.  Review of Systems: Constitutional: Negative for fever malaise or anorexia Cardiovascular: negative for chest pain Respiratory: negative for SOB or persistent cough Gastrointestinal: negative for abdominal pain  Objective  Vitals: BP 130/70 (BP Location: Left Arm, Patient Position: Sitting, Cuff Size: Normal)   Pulse 65   Temp (!) 97.3 F (36.3 C) (Temporal)   Ht 5\' 6"  (1.676 m)   Wt 154  lb (69.9 kg)   SpO2 97%   BMI 24.86 kg/m  General: no acute distress , A&Ox3 Skin:  Warm, no rashes, 3mm mobile smooth nontender cystic structure over left patella. No erythema, warmth, fluctuance  Right and left great toenails are dystrophic. Right 1/2 nail is almost completely avulsed.  Using forceps, the nail was easily separated from the medial nail fold.      Commons side effects, risks, benefits, and alternatives for medications and treatment plan prescribed today were discussed, and the patient expressed understanding of the given instructions. Patient is instructed to call or message via MyChart if he/she has any questions or concerns regarding our treatment plan. No barriers to understanding were identified. We discussed Red Flag symptoms and signs in detail. Patient expressed understanding regarding what to do in case of urgent or emergency type symptoms.   Medication list was reconciled, printed and provided to the patient in AVS. Patient instructions and summary information was reviewed with the patient as documented in the AVS. This note was prepared with assistance of Dragon voice recognition software. Occasional wrong-word or sound-a-like substitutions may have occurred due to the inherent limitations of voice recognition software  This visit occurred during the SARS-CoV-2 public health emergency.  Safety protocols were in place, including screening questions prior to the visit, additional usage of staff PPE, and extensive cleaning of exam room while observing appropriate contact time as indicated for disinfecting solutions.

## 2019-07-13 NOTE — Patient Instructions (Signed)
Please return in October for your annual complete physical; please come fasting.   If you have any questions or concerns, please don't hesitate to send me a message via MyChart or call the office at [phone]. Thank you for visiting with [name] today! It's our pleasure caring for you.   Fungal Nail Infection A fungal nail infection is a common infection of the toenails or fingernails. This condition affects toenails more often than fingernails. It often affects the great, or big, toes. More than one nail may be infected. The condition can be passed from person to person (is contagious). What are the causes? This condition is caused by a fungus. Several types of fungi can cause the infection. These fungi are common in moist and warm areas. If your hands or feet come into contact with the fungus, it may get into a crack in your fingernail or toenail and cause the infection. What increases the risk? The following factors may make you more likely to develop this condition:  Being male.  Being of older age.  Living with someone who has the fungus.  Walking barefoot in areas where the fungus thrives, such as showers or locker rooms.  Wearing shoes and socks that cause your feet to sweat.  Having a nail injury or a recent nail surgery.  Having certain medical conditions, such as: ? Athlete's foot. ? Diabetes. ? Psoriasis. ? Poor circulation. ? A weak body defense system (immune system). What are the signs or symptoms? Symptoms of this condition include:  A pale spot on the nail.  Thickening of the nail.  A nail that becomes yellow or brown.  A brittle or ragged nail edge.  A crumbling nail.  A nail that has lifted away from the nail bed. How is this diagnosed? This condition is diagnosed with a physical exam. Your health care provider may take a scraping or clipping from your nail to test for the fungus. How is this treated? Treatment is not needed for mild infections. If you  have significant nail changes, treatment may include:  Antifungal medicines taken by mouth (orally). You may need to take the medicine for several weeks or several months, and you may not see the results for a long time. These medicines can cause side effects. Ask your health care provider what problems to watch for.  Antifungal nail polish or nail cream. These may be used along with oral antifungal medicines.  Laser treatment of the nail.  Surgery to remove the nail. This may be needed for the most severe infections. It can take a long time, usually up to a year, for the infection to go away. The infection may also come back. Follow these instructions at home: Medicines  Take or apply over-the-counter and prescription medicines only as told by your health care provider.  Ask your health care provider about using over-the-counter mentholated ointment on your nails. Nail care  Trim your nails often.  Wash and dry your hands and feet every day.  Keep your feet dry: ? Wear absorbent socks, and change your socks frequently. ? Wear shoes that allow air to circulate, such as sandals or canvas tennis shoes. Throw out old shoes.  Do not use artificial nails.  If you go to a nail salon, make sure you choose one that uses clean instruments.  Use antifungal foot powder on your feet and in your shoes. General instructions  Do not share personal items, such as towels or nail clippers.  Do not walk barefoot  in shower rooms or locker rooms.  Wear rubber gloves if you are working with your hands in wet areas.  Keep all follow-up visits as told by your health care provider. This is important. Contact a health care provider if: Your infection is not getting better or it is getting worse after several months. Summary  A fungal nail infection is a common infection of the toenails or fingernails.  Treatment is not needed for mild infections. If you have significant nail changes, treatment  may include taking medicine orally and applying medicine to your nails.  It can take a long time, usually up to a year, for the infection to go away. The infection may also come back.  Take or apply over-the-counter and prescription medicines only as told by your health care provider.  Follow instructions for taking care of your nails to help prevent infection from coming back or spreading. This information is not intended to replace advice given to you by your health care provider. Make sure you discuss any questions you have with your health care provider. Document Revised: 04/30/2018 Document Reviewed: 06/13/2017 Elsevier Patient Education  [place].

## 2019-07-14 ENCOUNTER — Telehealth: Payer: Self-pay | Admitting: Pharmacist

## 2019-07-14 MED ORDER — DISOPYRAMIDE PHOSPHATE 100 MG PO CAPS: 100.0000 mg | ORAL_CAPSULE | Freq: Three times a day (TID) | ORAL | 1 refills | Status: DC

## 2019-07-14 NOTE — Telephone Encounter (Signed)
Per Dr. [name]- have Pt take 100 mg PO TID of short acting norpace IR.

## 2019-07-14 NOTE — Telephone Encounter (Signed)
Rx for norpace IR 100mg  TID sent to pharmacy. Spoke with patient wife (per DPR) and advised her of the above.

## 2019-07-14 NOTE — Telephone Encounter (Signed)
Patient called stating he will need a refill on his Norpace CR before Saturday, but it is unavailable. Asking if he can switch to the IR formulation. Problem is that it should be dosed 4 times a day. Lowest strength is 100mg . Therefore patient would be on 400mg - CR dose is 300mg /day. Will need to discuss with Dr. [name]

## 2019-08-09 ENCOUNTER — Telehealth (HOSPITAL_COMMUNITY): Payer: Self-pay

## 2019-08-09 NOTE — Telephone Encounter (Signed)
Patient called and states he has been in A-fib for 2 days or more. His heart rate has been running in the 80s and his blood pressure is 128/84. He emailed me a EKG reading and he is in A-fib. Discussed with Roderic Palau NP.  Advised patient we can see him on Thursday 7/22 at 8:30am. Consulted with patient and he verbalized understanding.

## 2019-08-12 ENCOUNTER — Ambulatory Visit (HOSPITAL_COMMUNITY)
Admission: RE | Admit: 2019-08-12 | Discharge: 2019-08-12 | Disposition: A | Discharge status: Home / Self Care | Payer: Medicare PPO | Source: Ambulatory Visit | Attending: Nurse Practitioner | Admitting: Nurse Practitioner

## 2019-08-12 ENCOUNTER — Other Ambulatory Visit (HOSPITAL_COMMUNITY): Payer: Self-pay | Admitting: Nurse Practitioner

## 2019-08-12 ENCOUNTER — Encounter (HOSPITAL_COMMUNITY): Payer: Self-pay | Admitting: Nurse Practitioner

## 2019-08-12 ENCOUNTER — Other Ambulatory Visit: Payer: Self-pay

## 2019-08-12 VITALS — BP 122/68 | HR 75 | Ht 67.0 in | Wt 156.0 lb

## 2019-08-12 DIAGNOSIS — I4819 Other persistent atrial fibrillation: Secondary | ICD-10-CM | POA: Diagnosis not present

## 2019-08-12 DIAGNOSIS — Z7901 Long term (current) use of anticoagulants: Secondary | ICD-10-CM | POA: Diagnosis not present

## 2019-08-12 DIAGNOSIS — I4892 Unspecified atrial flutter: Secondary | ICD-10-CM | POA: Insufficient documentation

## 2019-08-12 DIAGNOSIS — E785 Hyperlipidemia, unspecified: Secondary | ICD-10-CM | POA: Diagnosis not present

## 2019-08-12 DIAGNOSIS — Z87891 Personal history of nicotine dependence: Secondary | ICD-10-CM | POA: Insufficient documentation

## 2019-08-12 DIAGNOSIS — Z79899 Other long term (current) drug therapy: Secondary | ICD-10-CM | POA: Insufficient documentation

## 2019-08-12 DIAGNOSIS — D6869 Other thrombophilia: Secondary | ICD-10-CM

## 2019-08-12 LAB — BASIC METABOLIC PANEL
Anion gap: 9 (ref 5–15)
BUN: 12 mg/dL (ref 8–23)
CO2: 29 mmol/L (ref 22–32)
Calcium: 9.6 mg/dL (ref 8.9–10.3)
Chloride: 101 mmol/L (ref 98–111)
Creatinine, Ser: 1.12 mg/dL (ref 0.61–1.24)
GFR calc Af Amer: >60 mL/min (ref >60)
GFR calc non Af Amer: >60 mL/min (ref >60)
Glucose, Bld: 90 mg/dL (ref 70–99)
Potassium: 4.6 mmol/L (ref 3.5–5.1)
Sodium: 139 mmol/L (ref 135–145)

## 2019-08-12 LAB — CBC
HCT: 49.3 % (ref 39.0–52.0)
Hemoglobin: 15.9 g/dL (ref 13.0–17.0)
MCH: 29.1 pg (ref 26.0–34.0)
MCHC: 32.3 g/dL (ref 30.0–36.0)
MCV: 90.1 fL (ref 80.0–100.0)
Platelets: 226 10*3/uL (ref 150–400)
RBC: 5.47 MIL/uL (ref 4.22–5.81)
RDW: 12.7 % (ref 11.5–15.5)
WBC: 5.3 10*3/uL (ref 4.0–10.5)
nRBC: 0 % (ref 0.0–0.2)

## 2019-08-12 NOTE — H&P (View-Only) (Signed)
Patient ID: LA DIBELLA, male   DOB: [birth year], 74 y.o.   MRN: [id number]     Primary Care Physician: [name], MD Referring Physician: Dr. 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 He has had afib/flutter ablation in 2012  and afib ablation fall of 2018. He was on norpace at one time, changed to amiodarone which was stopped shortly after last ablation, then back to Norpace. He had to switch to short acting norpace on Monday as the extended release is on back order.   He has done well the majority of this year but started noticing some elevation of heart rate on Sunday. Otherwise, is not symptomatic. EKG shows afib with CVR  in the 70's.   He has had his covid shots, no missed anticoagulation for at least 3 weeks.   Today, he denies symptoms of  chest pain, shortness of breath, orthopnea, PND, lower extremity edema, dizziness, presyncope, syncope, or neurologic sequela.  .The patient is tolerating medications without difficulties and is otherwise without complaint today.   Past Medical History:  Diagnosis Date  . Current use of long term anticoagulation 11/06/2018  . DDD (degenerative disc disease)   . HLD (hyperlipidemia)   . 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 Persistent atrial fibrillation ([place])    s/p PVI 3/12   Past Surgical History:  Procedure Laterality Date  . APPENDECTOMY    . atrial fibrillation ablation  03/27/10   PVI with CTI ablation by JA  . ATRIAL FIBRILLATION ABLATION N/A 11/21/2016   Procedure: ATRIAL FIBRILLATION ABLATION;  Surgeon: [name], MD;  Location: [facility];  Service: Cardiovascular;  Laterality: N/A;  . CARDIOVERSION    . CARDIOVERSION  04/26/2011   Procedure: CARDIOVERSION;  Surgeon: Larey Dresser, MD;  Location: [place];  Service: Cardiovascular;  Laterality: N/A;  . CARDIOVERSION N/A 04/28/2012   Procedure: CARDIOVERSION;   Surgeon: Thayer Headings, MD;  Location: [place];  Service: Cardiovascular;  Laterality: N/A;  . CARDIOVERSION N/A 07/28/2012   Procedure: CARDIOVERSION;  Surgeon: Lelon Perla, MD;  Location: [facility] ENDOSCOPY;  Service: Cardiovascular;  Laterality: N/A;  . CARDIOVERSION N/A 08/14/2012   Procedure: CARDIOVERSION;  Surgeon: Josue Hector, MD;  Location: [place];  Service: Cardiovascular;  Laterality: N/A;  . CARDIOVERSION N/A 08/11/2014   Procedure: CARDIOVERSION;  Surgeon: Fay Records, MD;  Location: [place];  Service: Cardiovascular;  Laterality: N/A;  . CARDIOVERSION N/A 08/26/2014   Procedure: CARDIOVERSION;  Surgeon: Sanda Klein, MD;  Location: [facility] ENDOSCOPY;  Service: Cardiovascular;  Laterality: N/A;  . CARDIOVERSION N/A 10/10/2016   Procedure: CARDIOVERSION;  Surgeon: Larey Dresser, MD;  Location: [facility] ENDOSCOPY;  Service: Cardiovascular;  Laterality: N/A;  . CARDIOVERSION N/A 12/04/2016   Procedure: CARDIOVERSION;  Surgeon: Larey Dresser, MD;  Location: [facility] ENDOSCOPY;  Service: Cardiovascular;  Laterality: N/A;  . CARDIOVERSION N/A 06/09/2018   Procedure: CARDIOVERSION;  Surgeon: Dorothy Spark, MD;  Location: [place];  Service: Cardiovascular;  Laterality: N/A;  . SKIN CANCER EXCISION    . TEE WITHOUT CARDIOVERSION  04/26/2011   Procedure: TRANSESOPHAGEAL ECHOCARDIOGRAM (TEE);  Surgeon: Larey Dresser, MD;  Location: [facility] ENDOSCOPY;  Service: Cardiovascular;  Laterality: N/A;  to be done at 1330  . TEE WITHOUT CARDIOVERSION N/A 04/28/2012   Procedure: TRANSESOPHAGEAL ECHOCARDIOGRAM (TEE);  Surgeon: Thayer Headings, MD;  Location: [facility]  ENDOSCOPY;  Service: Cardiovascular;  Laterality: N/A;  . WISDOM TOOTH EXTRACTION      Current Outpatient Medications  Medication Sig Dispense Refill  . atorvastatin (LIPITOR) 10 MG tablet TAKE 1 TABLET BY MOUTH EVERY DAY 90 tablet 2  . calcium carbonate (TUMS - DOSED IN MG ELEMENTAL CALCIUM) 500 MG chewable tablet Chew 1 tablet by  mouth daily as needed for indigestion or heartburn.    . disopyramide (NORPACE) 100 MG capsule Take 1 capsule (100 mg total) by mouth 3 (three) times daily. 270 capsule 1  . ELIQUIS 5 MG TABS tablet TAKE 1 TABLET BY MOUTH TWICE A DAY 180 tablet 1  . fluticasone (CUTIVATE) 0.005 % ointment Apply 1 application topically 2 (two) times daily as needed (for skin rash/irritation.).     Marland Kitchen ibuprofen (ADVIL,MOTRIN) 200 MG tablet Take 400 mg by mouth every 8 (eight) hours as needed (for pain.).    Marland Kitchen melatonin 5 MG TABS Take 5 mg by mouth at bedtime.    . metoprolol tartrate (LOPRESSOR) 25 MG tablet TAKE 1 TABLET BY MOUTH TWICE A DAY 180 tablet 2  . Multiple Vitamin (MULTIVITAMIN WITH MINERALS) TABS tablet Take 1 tablet by mouth 3 (three) times a week.    . sildenafil (VIAGRA) 25 MG tablet Take 25 mg by mouth as needed for erectile dysfunction.      No current facility-administered medications for this encounter.    No Known Allergies  Social History   Socioeconomic History  . Marital status: Married    Spouse name: Not on file  . Number of children: Not on file  . Years of education: Not on file  . Highest education level: Not on file  Occupational History  . Occupation: professor    Employer: [employer]  Tobacco Use  . Smoking status: Former Smoker    Packs/day: 1.00    Years: 15.00    Pack years: 15.00    Types: Cigarettes    Quit date: 11/28/1975    Years since quitting: 43.7  . Smokeless tobacco: Never Used  Vaping Use  . Vaping Use: Never used  Substance and Sexual Activity  . Alcohol use: Yes    Alcohol/week: 6.0 standard drinks    Types: 3 Glasses of wine, 3 Cans of beer per week    Comment: rare wine  . Drug use: No  . Sexual activity: Yes  Other Topics Concern  . Not on file  Social History Narrative    a professor of Therapist, occupational at [employer] in [place] @ [employer] Strain:     . Difficulty of Paying Living Expenses:   Food Insecurity:   . Worried About Charity fundraiser in the Last Year:   . Arboriculturist in the Last Year:   Transportation Needs:   . Film/video editor (Medical):   Marland Kitchen Lack of Transportation (Non-Medical):   Physical Activity:   . Days of Exercise per Week:   . Minutes of Exercise per Session:   Stress:   . Feeling of Stress :   Social Connections:   . Frequency of Communication with Friends and Family:   . Frequency of Social Gatherings with Friends and Family:   . Attends Religious Services:   . Active Member of Clubs or Organizations:   . Attends Archivist Meetings:   Marland Kitchen Marital Status:   Intimate Partner Violence:   .  Fear of Current or Ex-Partner:   . Emotionally Abused:   Marland Kitchen Physically Abused:   . Sexually Abused:     Family History  Problem Relation Age of Onset  . Coronary artery disease Other   . Colon cancer Mother   . Lung cancer Mother   . Colon cancer Father   . Early death Neg Hx   . Hearing loss Neg Hx   . Heart disease Neg Hx   . Hyperlipidemia Neg Hx   . Hypertension Neg Hx   . Kidney disease Neg Hx   . Stroke Neg Hx     ROS- All systems are reviewed and negative except as per the HPI above  Physical Exam: Vitals:   08/12/19 0844  BP: 122/68  Pulse: 75  SpO2: 96%  Weight: 70.8 kg  Height: 5\' 7"  (1.702 m)    GEN- The patient is well appearing, alert and oriented x 3 today.   Head- normocephalic, atraumatic Eyes-  Sclera clear, conjunctiva pink Ears- hearing intact Oropharynx- clear Neck- supple, no JVP Lymph- no cervical lymphadenopathy Lungs- Clear to ausculation bilaterally, normal work of breathing Heart- irregular rate and rhythm, no murmurs, rubs or gallops, PMI not laterally displaced GI- soft, NT, ND, + BS Extremities- no clubbing, cyanosis, or edema MS- no significant deformity or atrophy Skin- no rash or lesion Psych- euthymic mood, full affect Neuro- strength and  sensation are intact  EKG- afib  at 73 bpm, qrs int at 100 ms, qtc 473   Assessment and Plan:  1. Persistent flutter  S/p ablation 03/2010 and 11/21/16 Persistent afib since Monday with CVR's Continue norpace 100 mg tid   Continue Eliquis 5 mg bid with chadsvasc score of 2, no missed doses for at least 3 weeks Continue metoprolol tartrate to 25 mg 1 bid    Cbc/bmet/covid today  F/u with afib clinic one  week after cardioversion  Butch Penny C. Bobbiejo Ishikawa, [facility] [address] [place], [place] [address] [phone]

## 2019-08-12 NOTE — Progress Notes (Signed)
Patient ID: Ronnie Black, male   DOB: 1945-09-02, 74 y.o.   MRN: [id number]     Primary Care Physician: [name], MD Referring Physician: Dr. 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 He has had afib/flutter ablation in 2012  and afib ablation fall of 2018. He was on norpace at one time, changed to amiodarone which was stopped shortly after last ablation, then back to Norpace. He had to switch to short acting norpace on Monday as the extended release is on back order.   He has done well the majority of this year but started noticing some elevation of heart rate on Sunday. Otherwise, is not symptomatic. EKG shows afib with CVR  in the 70's.   He has had his covid shots, no missed anticoagulation for at least 3 weeks.   Today, he denies symptoms of  chest pain, shortness of breath, orthopnea, PND, lower extremity edema, dizziness, presyncope, syncope, or neurologic sequela.  .The patient is tolerating medications without difficulties and is otherwise without complaint today.   Past Medical History:  Diagnosis Date  . Current use of long term anticoagulation 11/06/2018  . DDD (degenerative disc disease)   . HLD (hyperlipidemia)   . 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 Persistent atrial fibrillation ([place])    s/p PVI 3/12   Past Surgical History:  Procedure Laterality Date  . APPENDECTOMY    . atrial fibrillation ablation  03/27/10   PVI with CTI ablation by JA  . ATRIAL FIBRILLATION ABLATION N/A 11/21/2016   Procedure: ATRIAL FIBRILLATION ABLATION;  Surgeon: [name], MD;  Location: [place] [facility];  Service: Cardiovascular;  Laterality: N/A;  . CARDIOVERSION    . CARDIOVERSION  04/26/2011   Procedure: CARDIOVERSION;  Surgeon: Larey Dresser, MD;  Location: [place];  Service: Cardiovascular;  Laterality: N/A;  . CARDIOVERSION N/A 04/28/2012   Procedure: CARDIOVERSION;   Surgeon: Thayer Headings, MD;  Location: [place];  Service: Cardiovascular;  Laterality: N/A;  . CARDIOVERSION N/A 07/28/2012   Procedure: CARDIOVERSION;  Surgeon: Lelon Perla, MD;  Location: [facility] ENDOSCOPY;  Service: Cardiovascular;  Laterality: N/A;  . CARDIOVERSION N/A 08/14/2012   Procedure: CARDIOVERSION;  Surgeon: Josue Hector, MD;  Location: [place];  Service: Cardiovascular;  Laterality: N/A;  . CARDIOVERSION N/A 08/11/2014   Procedure: CARDIOVERSION;  Surgeon: Fay Records, MD;  Location: [place];  Service: Cardiovascular;  Laterality: N/A;  . CARDIOVERSION N/A 08/26/2014   Procedure: CARDIOVERSION;  Surgeon: Sanda Klein, MD;  Location: [facility] ENDOSCOPY;  Service: Cardiovascular;  Laterality: N/A;  . CARDIOVERSION N/A 10/10/2016   Procedure: CARDIOVERSION;  Surgeon: Larey Dresser, MD;  Location: [facility] ENDOSCOPY;  Service: Cardiovascular;  Laterality: N/A;  . CARDIOVERSION N/A 12/04/2016   Procedure: CARDIOVERSION;  Surgeon: Larey Dresser, MD;  Location: [facility] ENDOSCOPY;  Service: Cardiovascular;  Laterality: N/A;  . CARDIOVERSION N/A 06/09/2018   Procedure: CARDIOVERSION;  Surgeon: Dorothy Spark, MD;  Location: [place];  Service: Cardiovascular;  Laterality: N/A;  . SKIN CANCER EXCISION    . TEE WITHOUT CARDIOVERSION  04/26/2011   Procedure: TRANSESOPHAGEAL ECHOCARDIOGRAM (TEE);  Surgeon: Larey Dresser, MD;  Location: [facility] ENDOSCOPY;  Service: Cardiovascular;  Laterality: N/A;  to be done at 1330  . TEE WITHOUT CARDIOVERSION N/A 04/28/2012   Procedure: TRANSESOPHAGEAL ECHOCARDIOGRAM (TEE);  Surgeon: Thayer Headings, MD;  Location: [facility]  ENDOSCOPY;  Service: Cardiovascular;  Laterality: N/A;  . WISDOM TOOTH EXTRACTION      Current Outpatient Medications  Medication Sig Dispense Refill  . atorvastatin (LIPITOR) 10 MG tablet TAKE 1 TABLET BY MOUTH EVERY DAY 90 tablet 2  . calcium carbonate (TUMS - DOSED IN MG ELEMENTAL CALCIUM) 500 MG chewable tablet Chew 1 tablet by  mouth daily as needed for indigestion or heartburn.    . disopyramide (NORPACE) 100 MG capsule Take 1 capsule (100 mg total) by mouth 3 (three) times daily. 270 capsule 1  . ELIQUIS 5 MG TABS tablet TAKE 1 TABLET BY MOUTH TWICE A DAY 180 tablet 1  . fluticasone (CUTIVATE) 0.005 % ointment Apply 1 application topically 2 (two) times daily as needed (for skin rash/irritation.).     Marland Kitchen ibuprofen (ADVIL,MOTRIN) 200 MG tablet Take 400 mg by mouth every 8 (eight) hours as needed (for pain.).    Marland Kitchen melatonin 5 MG TABS Take 5 mg by mouth at bedtime.    . metoprolol tartrate (LOPRESSOR) 25 MG tablet TAKE 1 TABLET BY MOUTH TWICE A DAY 180 tablet 2  . Multiple Vitamin (MULTIVITAMIN WITH MINERALS) TABS tablet Take 1 tablet by mouth 3 (three) times a week.    . sildenafil (VIAGRA) 25 MG tablet Take 25 mg by mouth as needed for erectile dysfunction.      No current facility-administered medications for this encounter.    No Known Allergies  Social History   Socioeconomic History  . Marital status: Married    Spouse name: Not on file  . Number of children: Not on file  . Years of education: Not on file  . Highest education level: Not on file  Occupational History  . Occupation: professor    Employer: [employer]  Tobacco Use  . Smoking status: Former Smoker    Packs/day: 1.00    Years: 15.00    Pack years: 15.00    Types: Cigarettes    Quit date: 11/28/1975    Years since quitting: 43.7  . Smokeless tobacco: Never Used  Vaping Use  . Vaping Use: Never used  Substance and Sexual Activity  . Alcohol use: Yes    Alcohol/week: 6.0 standard drinks    Types: 3 Glasses of wine, 3 Cans of beer per week    Comment: rare wine  . Drug use: No  . Sexual activity: Yes  Other Topics Concern  . Not on file  Social History Narrative    a professor of Therapist, occupational at [employer] in [place] @ [place] Strain:     . Difficulty of Paying Living Expenses:   Food Insecurity:   . Worried About Charity fundraiser in the Last Year:   . Arboriculturist in the Last Year:   Transportation Needs:   . Film/video editor (Medical):   Marland Kitchen Lack of Transportation (Non-Medical):   Physical Activity:   . Days of Exercise per Week:   . Minutes of Exercise per Session:   Stress:   . Feeling of Stress :   Social Connections:   . Frequency of Communication with Friends and Family:   . Frequency of Social Gatherings with Friends and Family:   . Attends Religious Services:   . Active Member of Clubs or Organizations:   . Attends Archivist Meetings:   Marland Kitchen Marital Status:   Intimate Partner Violence:   .  Fear of Current or Ex-Partner:   . Emotionally Abused:   Marland Kitchen Physically Abused:   . Sexually Abused:     Family History  Problem Relation Age of Onset  . Coronary artery disease Other   . Colon cancer Mother   . Lung cancer Mother   . Colon cancer Father   . Early death Neg Hx   . Hearing loss Neg Hx   . Heart disease Neg Hx   . Hyperlipidemia Neg Hx   . Hypertension Neg Hx   . Kidney disease Neg Hx   . Stroke Neg Hx     ROS- All systems are reviewed and negative except as per the HPI above  Physical Exam: Vitals:   08/12/19 0844  BP: 122/68  Pulse: 75  SpO2: 96%  Weight: 70.8 kg  Height: 5\' 7"  (1.702 m)    GEN- The patient is well appearing, alert and oriented x 3 today.   Head- normocephalic, atraumatic Eyes-  Sclera clear, conjunctiva pink Ears- hearing intact Oropharynx- clear Neck- supple, no JVP Lymph- no cervical lymphadenopathy Lungs- Clear to ausculation bilaterally, normal work of breathing Heart- irregular rate and rhythm, no murmurs, rubs or gallops, PMI not laterally displaced GI- soft, NT, ND, + BS Extremities- no clubbing, cyanosis, or edema MS- no significant deformity or atrophy Skin- no rash or lesion Psych- euthymic mood, full affect Neuro- strength and  sensation are intact  EKG- afib  at 73 bpm, qrs int at 100 ms, qtc 473   Assessment and Plan:  1. Persistent flutter  S/p ablation 03/2010 and 11/21/16 Persistent afib since Monday with CVR's Continue norpace 100 mg tid   Continue Eliquis 5 mg bid with chadsvasc score of 2, no missed doses for at least 3 weeks Continue metoprolol tartrate to 25 mg 1 bid    Cbc/bmet/covid today  F/u with afib clinic one  week after cardioversion  Butch Penny C. [place], [facility] [address] [place], [place] [address] [phone]

## 2019-08-12 NOTE — Patient Instructions (Signed)
Cardioversion scheduled for July 27th 2021  - Arrive at the [facility] and go to admitting at 11:00am   - Do not eat or drink anything after midnight the night prior to your procedure.  - Take all your morning medication (except diabetic medications) with a sip of water prior to arrival.  - You will not be able to drive home after your procedure.  - Do NOT miss any doses of your blood thinner - if you should miss a dose please notify our office immediately.

## 2019-08-13 ENCOUNTER — Other Ambulatory Visit (HOSPITAL_COMMUNITY)
Admission: RE | Admit: 2019-08-13 | Discharge: 2019-08-13 | Disposition: A | Discharge status: Home / Self Care | Payer: Medicare PPO | Source: Ambulatory Visit | Attending: Cardiovascular Disease | Admitting: Cardiovascular Disease

## 2019-08-13 DIAGNOSIS — Z20822 Contact with and (suspected) exposure to covid-19: Secondary | ICD-10-CM | POA: Insufficient documentation

## 2019-08-13 DIAGNOSIS — Z01812 Encounter for preprocedural laboratory examination: Secondary | ICD-10-CM | POA: Diagnosis present

## 2019-08-13 LAB — SARS CORONAVIRUS 2 (TAT 6-24 HRS): SARS Coronavirus 2: NEGATIVE

## 2019-08-17 ENCOUNTER — Ambulatory Visit (HOSPITAL_COMMUNITY): Payer: Medicare PPO | Admitting: Anesthesiology

## 2019-08-17 ENCOUNTER — Encounter (HOSPITAL_COMMUNITY): Payer: Self-pay | Admitting: Cardiovascular Disease

## 2019-08-17 ENCOUNTER — Ambulatory Visit (HOSPITAL_COMMUNITY)
Admission: RE | Admit: 2019-08-17 | Discharge: 2019-08-17 | Disposition: A | Discharge status: Home / Self Care | Payer: Medicare PPO | Attending: Cardiovascular Disease | Admitting: Cardiovascular Disease

## 2019-08-17 ENCOUNTER — Other Ambulatory Visit: Payer: Self-pay

## 2019-08-17 ENCOUNTER — Encounter (HOSPITAL_COMMUNITY)
Admission: RE | Disposition: A | Discharge status: Home / Self Care | Payer: Self-pay | Source: Home / Self Care | Attending: Cardiovascular Disease

## 2019-08-17 DIAGNOSIS — Z79899 Other long term (current) drug therapy: Secondary | ICD-10-CM | POA: Diagnosis not present

## 2019-08-17 DIAGNOSIS — Z7901 Long term (current) use of anticoagulants: Secondary | ICD-10-CM | POA: Diagnosis not present

## 2019-08-17 DIAGNOSIS — Z87891 Personal history of nicotine dependence: Secondary | ICD-10-CM | POA: Diagnosis not present

## 2019-08-17 DIAGNOSIS — I4819 Other persistent atrial fibrillation: Secondary | ICD-10-CM

## 2019-08-17 DIAGNOSIS — E785 Hyperlipidemia, unspecified: Secondary | ICD-10-CM | POA: Insufficient documentation

## 2019-08-17 HISTORY — DX: Essential (primary) hypertension: I10

## 2019-08-17 HISTORY — PX: CARDIOVERSION: SHX1299

## 2019-08-17 SURGERY — CARDIOVERSION
Anesthesia: General

## 2019-08-17 MED ORDER — PROPOFOL 10 MG/ML IV BOLUS
INTRAVENOUS | Status: DC | PRN
  Administered 2019-08-17: 70 mg via INTRAVENOUS
  Administered 2019-08-17: 10 mg via INTRAVENOUS

## 2019-08-17 MED ORDER — LIDOCAINE 2% (20 MG/ML) 5 ML SYRINGE
INTRAMUSCULAR | Status: DC | PRN
  Administered 2019-08-17: 40 mg via INTRAVENOUS

## 2019-08-17 MED ORDER — SODIUM CHLORIDE 0.9 % IV SOLN
INTRAVENOUS | Status: DC | PRN
Start: 2019-08-17 — End: 2019-08-17

## 2019-08-17 NOTE — Anesthesia Procedure Notes (Signed)
Procedure Name: General with mask airway Date/Time: 08/17/2019 12:41 PM Performed by: Kathryne Hitch, CRNA Pre-anesthesia Checklist: Patient identified, Emergency Drugs available, Suction available and Patient being monitored Patient Re-evaluated:Patient Re-evaluated prior to induction Oxygen Delivery Method: Ambu bag Preoxygenation: Pre-oxygenation with 100% oxygen Induction Type: IV induction Ventilation: Mask ventilation without difficulty Dental Injury: Teeth and Oropharynx as per pre-operative assessment

## 2019-08-17 NOTE — Anesthesia Postprocedure Evaluation (Signed)
Anesthesia Post Note  Patient: Ronnie Black  Procedure(s) Performed: CARDIOVERSION (N/A )     Patient location during evaluation: Endoscopy Anesthesia Type: General Level of consciousness: awake Pain management: pain level controlled Vital Signs Assessment: post-procedure vital signs reviewed and stable Respiratory status: spontaneous breathing Cardiovascular status: stable Postop Assessment: no apparent nausea or vomiting Anesthetic complications: yes   No complications documented.  Last Vitals:  Vitals:   08/17/19 1320 08/17/19 1329  BP: (!) 142/85 (!) 145/86  Pulse: 76 75  Resp: 20 20  Temp:    SpO2: 97% 100%    Last Pain:  Vitals:   08/17/19 1329  TempSrc:   PainSc: 0-No pain                 Buell Parcel

## 2019-08-17 NOTE — Transfer of Care (Signed)
Immediate Anesthesia Transfer of Care Note  Patient: Ronnie Black  Procedure(s) Performed: CARDIOVERSION (N/A )  Patient Location: Endoscopy Unit  Anesthesia Type:General  Level of Consciousness: drowsy  Airway & Oxygen Therapy: Patient Spontanous Breathing and Patient connected to nasal cannula oxygen  Post-op Assessment: Report given to RN and Post -op Vital signs reviewed and stable  Post vital signs: Reviewed and stable  Last Vitals:  Vitals Value Taken Time  BP 124/78   Temp    Pulse 75   Resp    SpO2 98     Last Pain:  Vitals:   08/17/19 1147  TempSrc: Axillary  PainSc: 0-No pain         Complications: No complications documented.

## 2019-08-17 NOTE — CV Procedure (Signed)
Electrical Cardioversion Procedure Note Ronnie Black [id number] 1945-12-27  Procedure: Electrical Cardioversion Indications:  Atrial Fibrillation  Procedure Details Consent: Risks of procedure as well as the alternatives and risks of each were explained to the (patient/caregiver).  Consent for procedure obtained. Time Out: Verified patient identification, verified procedure, site/side was marked, verified correct patient position, special equipment/implants available, medications/allergies/relevent history reviewed, required imaging and test results available.  Performed  Patient placed on cardiac monitor, pulse oximetry, supplemental oxygen as necessary.  Sedation given: propfol Pacer pads placed anterior and posterior chest.  Cardioverted 1 time(s).  Cardioverted at 150J.  Evaluation Findings: Post procedure EKG shows: NSR Complications: None Patient did tolerate procedure well.   Ronnie Latch, MD 08/17/2019, 12:52 PM

## 2019-08-17 NOTE — Interval H&P Note (Signed)
History and Physical Interval Note:  08/17/2019 12:33 PM  Ronnie Black  has presented today for surgery, with the diagnosis of AFIB.  The various methods of treatment have been discussed with the patient and family. After consideration of risks, benefits and other options for treatment, the patient has consented to  Procedure(s): CARDIOVERSION (N/A) as a surgical intervention.  The patient's history has been reviewed, patient examined, no change in status, stable for surgery.  I have reviewed the patient's chart and labs.  Questions were answered to the patient's satisfaction.     [name], MD

## 2019-08-17 NOTE — Discharge Instructions (Signed)
Electrical Cardioversion  Follow these instructions at home:  Do not drive for 24 hours if you were given a sedative during your procedure.  Take over-the-counter and prescription medicines only as told by your health care provider.  Ask your health care provider how to check your pulse. Check it often.  Rest for 48 hours after the procedure or as told by your health care provider.  Avoid or limit your caffeine use as told by your health care provider.  Keep all follow-up visits as told by your health care provider. This is important. Contact a health care provider if:  You feel like your heart is beating too quickly or your pulse is not regular.  You have a serious muscle cramp that does not go away. Get help right away if:  You have discomfort in your chest.  You are dizzy or you feel faint.  You have trouble breathing or you are short of breath.  Your speech is slurred.  You have trouble moving an arm or leg on one side of your body.  Your fingers or toes turn cold or blue. Summary  Electrical cardioversion is the delivery of a jolt of electricity to restore a normal rhythm to the heart.  This procedure may be done right away in an emergency or may be a scheduled procedure if the condition is not an emergency.  Generally, this is a safe procedure.  After the procedure, check your pulse often as told by your health care provider. This information is not intended to replace advice given to you by your health care provider. Make sure you discuss any questions you have with your health care provider. Document Revised: 08/10/2018 Document Reviewed: 08/10/2018 Elsevier Patient Education  2020 [employer] Inc[employer]  

## 2019-08-17 NOTE — Anesthesia Preprocedure Evaluation (Signed)
Anesthesia Evaluation  Patient identified by MRN, date of birth, ID band Patient awake    Reviewed: Allergy & Precautions, NPO status , Patient's Chart, lab work & pertinent test results  Airway Mallampati: II  TM Distance: >3 FB     Dental   Pulmonary neg pulmonary ROS, former smoker,    breath sounds clear to auscultation       Cardiovascular hypertension,  Rhythm:Irregular Rate:Normal     Neuro/Psych    GI/Hepatic negative GI ROS, Neg liver ROS,   Endo/Other  negative endocrine ROS  Renal/GU negative Renal ROS     Musculoskeletal  (+) Arthritis ,   Abdominal   Peds  Hematology   Anesthesia Other Findings   Reproductive/Obstetrics                             Anesthesia Physical Anesthesia Plan  ASA: III  Anesthesia Plan: General   Post-op Pain Management:    Induction: Intravenous  PONV Risk Score and Plan: 2 and Propofol infusion  Airway Management Planned: Simple Face Mask  Additional Equipment:   Intra-op Plan:   Post-operative Plan:   Informed Consent: I have reviewed the patients History and Physical, chart, labs and discussed the procedure including the risks, benefits and alternatives for the proposed anesthesia with the patient or authorized representative who has indicated his/her understanding and acceptance.     Dental advisory given  Plan Discussed with: CRNA and Anesthesiologist  Anesthesia Plan Comments:         Anesthesia Quick Evaluation

## 2019-08-18 ENCOUNTER — Encounter (HOSPITAL_COMMUNITY): Payer: Self-pay | Admitting: Cardiovascular Disease

## 2019-08-24 ENCOUNTER — Ambulatory Visit (HOSPITAL_COMMUNITY)
Admission: RE | Admit: 2019-08-24 | Discharge: 2019-08-24 | Disposition: A | Discharge status: Home / Self Care | Payer: Medicare PPO | Source: Ambulatory Visit | Attending: Physician Assistant | Admitting: Physician Assistant

## 2019-08-24 ENCOUNTER — Other Ambulatory Visit: Payer: Self-pay

## 2019-08-24 VITALS — BP 126/70 | HR 66 | Ht 66.0 in | Wt 153.8 lb

## 2019-08-24 DIAGNOSIS — D6869 Other thrombophilia: Secondary | ICD-10-CM | POA: Diagnosis not present

## 2019-08-24 DIAGNOSIS — I4892 Unspecified atrial flutter: Secondary | ICD-10-CM | POA: Insufficient documentation

## 2019-08-24 DIAGNOSIS — I4819 Other persistent atrial fibrillation: Secondary | ICD-10-CM | POA: Insufficient documentation

## 2019-08-24 DIAGNOSIS — I10 Essential (primary) hypertension: Secondary | ICD-10-CM | POA: Insufficient documentation

## 2019-08-24 DIAGNOSIS — E785 Hyperlipidemia, unspecified: Secondary | ICD-10-CM | POA: Insufficient documentation

## 2019-08-24 DIAGNOSIS — Z7901 Long term (current) use of anticoagulants: Secondary | ICD-10-CM | POA: Diagnosis not present

## 2019-08-24 NOTE — Progress Notes (Signed)
Patient ID: Ronnie Black, male   DOB: 1945-04-29, 74 y.o.   MRN: [id number]     Primary Care Physician: [name], MD Referring Physician: Dr. 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 He has had afib/flutter ablation in 2012  and afib ablation fall of 2018. He was on norpace at one time, changed to amiodarone which was stopped shortly after last ablation, then back to Norpace. He had to switch to short acting norpace on Monday as the extended release is on back order.   He has done well the majority of this year but started noticing some elevation of heart rate on Sunday. Otherwise, is not symptomatic. EKG shows afib with CVR  in the 70's.   Follow up in the AF clinic 08/24/19. Patient is s/p DCCV on 08/17/19. He appears to be maintaining SR. He denies further heart racing or palpitations. He denies bleeding issues on anticoagulation.   Today, he denies symptoms of palpitations, chest pain, shortness of breath, orthopnea, PND, lower extremity edema, dizziness, presyncope, syncope, or neurologic sequela.  .The patient is tolerating medications without difficulties and is otherwise without complaint today.   Past Medical History:  Diagnosis Date  . Current use of long term anticoagulation 11/06/2018  . DDD (degenerative disc disease)   . HLD (hyperlipidemia)   . Hypertension   . 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 Persistent atrial fibrillation ([place])    s/p PVI 3/12   Past Surgical History:  Procedure Laterality Date  . APPENDECTOMY    . atrial fibrillation ablation  03/27/10   PVI with CTI ablation by JA  . ATRIAL FIBRILLATION ABLATION N/A 11/21/2016   Procedure: ATRIAL FIBRILLATION ABLATION;  Surgeon: [name], MD;  Location: [place] [facility];  Service: Cardiovascular;  Laterality: N/A;  . CARDIOVERSION    . CARDIOVERSION  04/26/2011   Procedure: CARDIOVERSION;  Surgeon:  Larey Dresser, MD;  Location: [place];  Service: Cardiovascular;  Laterality: N/A;  . CARDIOVERSION N/A 04/28/2012   Procedure: CARDIOVERSION;  Surgeon: Thayer Headings, MD;  Location: [place];  Service: Cardiovascular;  Laterality: N/A;  . CARDIOVERSION N/A 07/28/2012   Procedure: CARDIOVERSION;  Surgeon: Lelon Perla, MD;  Location: [facility] ENDOSCOPY;  Service: Cardiovascular;  Laterality: N/A;  . CARDIOVERSION N/A 08/14/2012   Procedure: CARDIOVERSION;  Surgeon: Josue Hector, MD;  Location: [place];  Service: Cardiovascular;  Laterality: N/A;  . CARDIOVERSION N/A 08/11/2014   Procedure: CARDIOVERSION;  Surgeon: Fay Records, MD;  Location: [place];  Service: Cardiovascular;  Laterality: N/A;  . CARDIOVERSION N/A 08/26/2014   Procedure: CARDIOVERSION;  Surgeon: Sanda Klein, MD;  Location: [place];  Service: Cardiovascular;  Laterality: N/A;  . CARDIOVERSION N/A 10/10/2016   Procedure: CARDIOVERSION;  Surgeon: Larey Dresser, MD;  Location: [facility] ENDOSCOPY;  Service: Cardiovascular;  Laterality: N/A;  . CARDIOVERSION N/A 12/04/2016   Procedure: CARDIOVERSION;  Surgeon: Larey Dresser, MD;  Location: [facility] ENDOSCOPY;  Service: Cardiovascular;  Laterality: N/A;  . CARDIOVERSION N/A 06/09/2018   Procedure: CARDIOVERSION;  Surgeon: Dorothy Spark, MD;  Location: [facility] ENDOSCOPY;  Service: Cardiovascular;  Laterality: N/A;  . CARDIOVERSION N/A 08/17/2019   Procedure: CARDIOVERSION;  Surgeon: [name], MD;  Location: [place];  Service: Cardiovascular;  Laterality: N/A;  . SKIN CANCER EXCISION    . TEE WITHOUT CARDIOVERSION  04/26/2011   Procedure: TRANSESOPHAGEAL  ECHOCARDIOGRAM (TEE);  Surgeon: Larey Dresser, MD;  Location: [facility] ENDOSCOPY;  Service: Cardiovascular;  Laterality: N/A;  to be done at 1330  . TEE WITHOUT CARDIOVERSION N/A 04/28/2012   Procedure: TRANSESOPHAGEAL ECHOCARDIOGRAM (TEE);  Surgeon: Thayer Headings, MD;  Location: [place];  Service:  Cardiovascular;  Laterality: N/A;  . WISDOM TOOTH EXTRACTION      Current Outpatient Medications  Medication Sig Dispense Refill  . atorvastatin (LIPITOR) 10 MG tablet TAKE 1 TABLET BY MOUTH EVERY DAY (Patient taking differently: Take 10 mg by mouth at bedtime. ) 90 tablet 2  . calcium carbonate (TUMS - DOSED IN MG ELEMENTAL CALCIUM) 500 MG chewable tablet Chew 1 tablet by mouth daily as needed for indigestion or heartburn.    Marland Kitchen ELIQUIS 5 MG TABS tablet TAKE 1 TABLET BY MOUTH TWICE A DAY (Patient taking differently: Take 5 mg by mouth 2 (two) times daily. ) 180 tablet 1  . melatonin 5 MG TABS Take 5 mg by mouth at bedtime.    . metoprolol tartrate (LOPRESSOR) 25 MG tablet TAKE 1 TABLET BY MOUTH TWICE A DAY (Patient taking differently: Take 25 mg by mouth 2 (two) times daily. ) 180 tablet 2  . Multiple Vitamin (MULTIVITAMIN WITH MINERALS) TABS tablet Take 1 tablet by mouth 3 (three) times a week.    . NORPACE CR 150 MG 12 hr capsule Take 150 mg by mouth 2 (two) times daily.     . sildenafil (VIAGRA) 25 MG tablet Take 25 mg by mouth once a week.      No current facility-administered medications for this encounter.    No Known Allergies  Social History   Socioeconomic History  . Marital status: Married    Spouse name: Not on file  . Number of children: 2  . Years of education: Not on file  . Highest education level: Not on file  Occupational History  . Occupation: professor    Employer: [employer]  Tobacco Use  . Smoking status: Former Smoker    Packs/day: 1.00    Years: 15.00    Pack years: 15.00    Types: Cigarettes    Quit date: 11/28/1975    Years since quitting: 43.7  . Smokeless tobacco: Never Used  Vaping Use  . Vaping Use: Never used  Substance and Sexual Activity  . Alcohol use: Yes    Alcohol/week: 6.0 standard drinks    Types: 3 Glasses of wine, 3 Cans of beer per week    Comment: rare wine  . Drug use: No  . Sexual activity: Yes  Other Topics Concern  .  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 Difficulty of Paying Living Expenses:   Food Insecurity:   . Worried About Charity fundraiser in the Last Year:   . Arboriculturist in the Last Year:   Transportation Needs:   . Film/video editor (Medical):   Marland Kitchen Lack of Transportation (Non-Medical):   Physical Activity:   . Days of Exercise per Week:   . Minutes of Exercise per Session:   Stress:   . Feeling of Stress :   Social Connections:   . Frequency of Communication with Friends and Family:   . Frequency of Social Gatherings with Friends and Family:   . Attends  Religious Services:   . Active Member of Clubs or Organizations:   . Attends Archivist Meetings:   Marland Kitchen Marital Status:   Intimate Partner Violence:   . Fear of Current or Ex-Partner:   . Emotionally Abused:   Marland Kitchen Physically Abused:   . Sexually Abused:     Family History  Problem Relation Age of Onset  . Coronary artery disease Other   . Colon cancer Mother   . Lung cancer Mother   . Colon cancer Father   . Early death Neg Hx   . Hearing loss Neg Hx   . Heart disease Neg Hx   . Hyperlipidemia Neg Hx   . Hypertension Neg Hx   . Kidney disease Neg Hx   . Stroke Neg Hx     ROS- All systems are reviewed and negative except as per the HPI above  Physical Exam: There were no vitals filed for this visit.  GEN- The patient is well appearing elderly male, alert and oriented x 3 today.   HEENT-head normocephalic, atraumatic, sclera clear, conjunctiva pink, hearing intact, trachea midline. Lungs- Clear to ausculation bilaterally, normal work of breathing Heart- Regular rate and rhythm, occasional ectopic beat, no murmurs, rubs or gallops  GI- soft, NT, ND, + BS Extremities- no clubbing, cyanosis, or edema MS- no significant deformity or  atrophy Skin- no rash or lesion Psych- euthymic mood, full affect Neuro- strength and sensation are intact   EKG- SR HR 66, PACs, 1st degree AV block, NST, PR 216, QRS 94, QTc 436   Assessment and Plan:  1. Persistent afib/atrial flutter  S/p ablation 03/2010 and 11/21/16 S/p DCCV on 08/17/19 Patient appears to be maintaining SR. Continue norpace 100 mg tid   Continue Eliquis 5 mg bid with chadsvasc score of 2 Continue metoprolol tartrate to 25 mg BID   Follow up with Tommye Standard per recall. AF clinic as needed.    [facility] [address] [place], [place] [address] [phone]

## 2019-08-26 ENCOUNTER — Other Ambulatory Visit: Payer: Self-pay | Admitting: Internal Medicine

## 2019-08-26 DIAGNOSIS — E785 Hyperlipidemia, unspecified: Secondary | ICD-10-CM

## 2019-08-30 ENCOUNTER — Telehealth: Payer: Self-pay | Admitting: Family Medicine

## 2019-08-30 NOTE — Telephone Encounter (Signed)
Nurse Assessment Nurse: Lenon Curt, RN, Melanie Date/Time (Eastern Time): 08/28/2019 10:36:10 AM Confirm and document reason for call. If symptomatic, describe symptoms. ---Caller is having pain and blood with urination. Started a couple days ago. Today is the first time with blood. Has the patient had close contact with a person known or suspected to have the novel coronavirus illness OR traveled / lives in area with major community spread (including international travel) in the last 14 days from the onset of symptoms? * If Asymptomatic, screen for exposure and travel within the last 14 days. ---No Does the patient have any new or worsening symptoms? ---Yes Will a triage be completed? ---Yes Related visit to physician within the last 2 weeks? ---No Does the PT have any chronic conditions? (i.e. diabetes, asthma, this includes High risk factors for pregnancy, etc.) ---Yes List chronic conditions. ---Afib - cardioversion, Is this a behavioral health or substance abuse call? ---No Guidelines Guideline Title Affirmed Question Affirmed Notes Nurse Date/Time (Eastern Time) Urination Pain - Male Blood in urine (red, pink, or tea-colored) Lenon Curt, RN, Melanie 08/28/2019 10:38:28 AM Disp. Time Eilene Ghazi Time) Disposition Final User 08/28/2019 10:44:42 AM See PCP within 24 Hours Yes Coley, RN, MelaniePLEASE NOTE: All timestamps contained within this report are represented as [place] Standard Time. CONFIDENTIALTY NOTICE: This fax transmission is intended only for the addressee. It contains information that is legally privileged, confidential or otherwise protected from use or disclosure. If you are not the intended recipient, you are strictly prohibited from reviewing, disclosing, copying using or disseminating any of this information or taking any action in reliance on or regarding this information. If you have received this fax in error, please notify us immediately by telephone so that we can arrange for  its return to [name]. Phone: [phone], Toll-Free: [phone], Fax: [phone] Page: 2 of 2 Call Id: [id number] [place] Disagree/Comply Comply Caller Understands Yes PreDisposition Call Doctor Care Advice Given Per Guideline * IF OFFICE WILL BE CLOSED: You need to be seen within the next 24 hours. A clinic or an urgent care center is often a good source of care if your doctor's office is closed or you can't get an appointment. SEE PCP WITHIN 24 HOURS: FLUIDS: * Drink extra fluids. CALL BACK IF: * Fever over 100.4 F (38.0 C) occurs * Side (flank) or lower back pain occurs * You become worse. CARE ADVICE given per Urination Pain - Male (Adult) guideline. Referrals GO TO FACILITY UNDECIDE

## 2019-11-11 ENCOUNTER — Ambulatory Visit (HOSPITAL_COMMUNITY)
Admission: RE | Admit: 2019-11-11 | Discharge: 2019-11-11 | Disposition: A | Discharge status: Home / Self Care | Payer: Medicare PPO | Source: Ambulatory Visit | Attending: Nurse Practitioner | Admitting: Nurse Practitioner

## 2019-11-11 ENCOUNTER — Other Ambulatory Visit: Payer: Self-pay

## 2019-11-11 ENCOUNTER — Encounter (HOSPITAL_COMMUNITY): Payer: Self-pay | Admitting: Nurse Practitioner

## 2019-11-11 VITALS — BP 130/76 | HR 79 | Ht 66.0 in | Wt 156.6 lb

## 2019-11-11 DIAGNOSIS — D6869 Other thrombophilia: Secondary | ICD-10-CM | POA: Diagnosis not present

## 2019-11-11 DIAGNOSIS — I484 Atypical atrial flutter: Secondary | ICD-10-CM | POA: Diagnosis not present

## 2019-11-11 DIAGNOSIS — I4819 Other persistent atrial fibrillation: Secondary | ICD-10-CM | POA: Insufficient documentation

## 2019-11-11 DIAGNOSIS — Z79899 Other long term (current) drug therapy: Secondary | ICD-10-CM | POA: Insufficient documentation

## 2019-11-11 DIAGNOSIS — I10 Essential (primary) hypertension: Secondary | ICD-10-CM | POA: Diagnosis not present

## 2019-11-11 DIAGNOSIS — Z87891 Personal history of nicotine dependence: Secondary | ICD-10-CM | POA: Insufficient documentation

## 2019-11-11 DIAGNOSIS — E785 Hyperlipidemia, unspecified: Secondary | ICD-10-CM | POA: Insufficient documentation

## 2019-11-11 DIAGNOSIS — Z7901 Long term (current) use of anticoagulants: Secondary | ICD-10-CM | POA: Insufficient documentation

## 2019-11-11 LAB — BASIC METABOLIC PANEL
Anion gap: 9 (ref 5–15)
BUN: 14 mg/dL (ref 8–23)
CO2: 28 mmol/L (ref 22–32)
Calcium: 9.5 mg/dL (ref 8.9–10.3)
Chloride: 102 mmol/L (ref 98–111)
Creatinine, Ser: 0.99 mg/dL (ref 0.61–1.24)
GFR, Estimated: >60 mL/min (ref >60)
Glucose, Bld: 95 mg/dL (ref 70–99)
Potassium: 4.4 mmol/L (ref 3.5–5.1)
Sodium: 139 mmol/L (ref 135–145)

## 2019-11-11 LAB — CBC
HCT: 48.5 % (ref 39.0–52.0)
Hemoglobin: 16.1 g/dL (ref 13.0–17.0)
MCH: 29.4 pg (ref 26.0–34.0)
MCHC: 33.2 g/dL (ref 30.0–36.0)
MCV: 88.7 fL (ref 80.0–100.0)
Platelets: 236 10*3/uL (ref 150–400)
RBC: 5.47 MIL/uL (ref 4.22–5.81)
RDW: 12.5 % (ref 11.5–15.5)
WBC: 7 10*3/uL (ref 4.0–10.5)
nRBC: 0 % (ref 0.0–0.2)

## 2019-11-11 NOTE — Patient Instructions (Signed)
Cardioversion scheduled for Thursday, October 28th  - Arrive at the [facility] and go to admitting at 1030AM  - Do not eat or drink anything after midnight the night prior to your procedure.  - Take all your morning medication (except diabetic medications) with a sip of water prior to arrival.  - You will not be able to drive home after your procedure.  - Do NOT miss any doses of your blood thinner - if you should miss a dose please notify our office immediately.  - If you feel as if you go back into normal rhythm prior to scheduled cardioversion, please notify our office immediately. If your procedure is canceled in the cardioversion suite you will be charged a cancellation fee.

## 2019-11-11 NOTE — H&P (View-Only) (Signed)
Patient ID: Ronnie Black, male   DOB: 1945/06/22, 74 y.o.   MRN: [id number]     Primary Care Physician: [name], MD Referring Physician: Dr. 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 He has had afib/flutter ablation in 2012  and afib ablation fall of 2018. He was on norpace at one time, changed to amiodarone which was stopped shortly after last ablation, then back to Norpace.   He has done well the majority of this year but needed CV in July which was successful. HE nos has been out of rhythm since Tuesday. He has had his covid shots, no missed anticoagulation for at least 3 weeks. He wishes to be cardioverted.   Today, he denies symptoms of  chest pain, shortness of breath, orthopnea, PND, lower extremity edema, dizziness, presyncope, syncope, or neurologic sequela.  .The patient is tolerating medications without difficulties and is otherwise without complaint today.   Past Medical History:  Diagnosis Date  . Current use of long term anticoagulation 11/06/2018  . DDD (degenerative disc disease)   . HLD (hyperlipidemia)   . Hypertension   . 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 Persistent atrial fibrillation ([place])    s/p PVI 3/12   Past Surgical History:  Procedure Laterality Date  . APPENDECTOMY    . atrial fibrillation ablation  03/27/10   PVI with CTI ablation by JA  . ATRIAL FIBRILLATION ABLATION N/A 11/21/2016   Procedure: ATRIAL FIBRILLATION ABLATION;  Surgeon: [name], MD;  Location: [place] [facility];  Service: Cardiovascular;  Laterality: N/A;  . CARDIOVERSION    . CARDIOVERSION  04/26/2011   Procedure: CARDIOVERSION;  Surgeon: Larey Dresser, MD;  Location: [place];  Service: Cardiovascular;  Laterality: N/A;  . CARDIOVERSION N/A 04/28/2012   Procedure: CARDIOVERSION;  Surgeon: Thayer Headings, MD;  Location: [place];  Service: Cardiovascular;  Laterality:  N/A;  . CARDIOVERSION N/A 07/28/2012   Procedure: CARDIOVERSION;  Surgeon: Lelon Perla, MD;  Location: [facility] ENDOSCOPY;  Service: Cardiovascular;  Laterality: N/A;  . CARDIOVERSION N/A 08/14/2012   Procedure: CARDIOVERSION;  Surgeon: Josue Hector, MD;  Location: [place];  Service: Cardiovascular;  Laterality: N/A;  . CARDIOVERSION N/A 08/11/2014   Procedure: CARDIOVERSION;  Surgeon: Fay Records, MD;  Location: [place];  Service: Cardiovascular;  Laterality: N/A;  . CARDIOVERSION N/A 08/26/2014   Procedure: CARDIOVERSION;  Surgeon: Sanda Klein, MD;  Location: [place];  Service: Cardiovascular;  Laterality: N/A;  . CARDIOVERSION N/A 10/10/2016   Procedure: CARDIOVERSION;  Surgeon: Larey Dresser, MD;  Location: [facility] ENDOSCOPY;  Service: Cardiovascular;  Laterality: N/A;  . CARDIOVERSION N/A 12/04/2016   Procedure: CARDIOVERSION;  Surgeon: Larey Dresser, MD;  Location: [facility] ENDOSCOPY;  Service: Cardiovascular;  Laterality: N/A;  . CARDIOVERSION N/A 06/09/2018   Procedure: CARDIOVERSION;  Surgeon: Dorothy Spark, MD;  Location: [facility] ENDOSCOPY;  Service: Cardiovascular;  Laterality: N/A;  . CARDIOVERSION N/A 08/17/2019   Procedure: CARDIOVERSION;  Surgeon: [name], MD;  Location: [place];  Service: Cardiovascular;  Laterality: N/A;  . SKIN CANCER EXCISION    . TEE WITHOUT CARDIOVERSION  04/26/2011   Procedure: TRANSESOPHAGEAL ECHOCARDIOGRAM (TEE);  Surgeon: Larey Dresser, MD;  Location: [facility] ENDOSCOPY;  Service: Cardiovascular;  Laterality: N/A;  to be done at 1330  . TEE WITHOUT CARDIOVERSION N/A 04/28/2012   Procedure: TRANSESOPHAGEAL ECHOCARDIOGRAM (TEE);  Surgeon:  Thayer Headings, MD;  Location: [place];  Service: Cardiovascular;  Laterality: N/A;  . WISDOM TOOTH EXTRACTION      Current Outpatient Medications  Medication Sig Dispense Refill  . atorvastatin (LIPITOR) 10 MG tablet TAKE 1 TABLET BY MOUTH EVERY DAY 90 tablet 1  . calcium carbonate (TUMS - DOSED  IN MG ELEMENTAL CALCIUM) 500 MG chewable tablet Chew 1 tablet by mouth daily as needed for indigestion or heartburn.    . disopyramide (NORPACE) 100 MG capsule Take 300 mg by mouth daily.    Marland Kitchen ELIQUIS 5 MG TABS tablet TAKE 1 TABLET BY MOUTH TWICE A DAY (Patient taking differently: Take 5 mg by mouth 2 (two) times daily. ) 180 tablet 1  . melatonin 5 MG TABS Take 5 mg by mouth at bedtime.    . metoprolol tartrate (LOPRESSOR) 25 MG tablet TAKE 1 TABLET BY MOUTH TWICE A DAY (Patient taking differently: Take 25 mg by mouth 2 (two) times daily. ) 180 tablet 2  . Multiple Vitamin (MULTIVITAMIN WITH MINERALS) TABS tablet Take 1 tablet by mouth 3 (three) times a week.    . sildenafil (VIAGRA) 25 MG tablet Take 25 mg by mouth as needed for erectile dysfunction.     . NORPACE CR 150 MG 12 hr capsule Take 150 mg by mouth 2 (two) times daily.  (Patient not taking: Reported on 11/11/2019)     No current facility-administered medications for this encounter.    No Known Allergies  Social History   Socioeconomic History  . Marital status: Married    Spouse name: Not on file  . Number of children: 2  . Years of education: Not on file  . Highest education level: Not on file  Occupational History  . Occupation: professor    Employer: [employer]  Tobacco Use  . Smoking status: Former Smoker    Packs/day: 1.00    Years: 15.00    Pack years: 15.00    Types: Cigarettes    Quit date: 11/28/1975    Years since quitting: 43.9  . Smokeless tobacco: Never Used  Vaping Use  . Vaping Use: Never used  Substance and Sexual Activity  . Alcohol use: Yes    Alcohol/week: 6.0 standard drinks    Types: 3 Glasses of wine, 3 Cans of beer per week    Comment: rare wine  . Drug use: No  . Sexual activity: Yes  Other Topics Concern  . Not on file  Social History Narrative    a professor of Therapist, occupational at [employer] in [place] @ [place] Strain:   . Difficulty of Paying Living Expenses: Not on file  Food Insecurity:   . Worried About Charity fundraiser in the Last Year: Not on file  . Ran Out of Food in the Last Year: Not on file  Transportation Needs:   . Lack of Transportation (Medical): Not on file  . Lack of Transportation (Non-Medical): Not on file  Physical Activity:   . Days of Exercise per Week: Not on file  . Minutes of Exercise per Session: Not on file  Stress:   . Feeling of Stress : Not on file  Social Connections:   . Frequency of Communication with Friends and Family: Not on file  . Frequency of Social Gatherings with Friends and Family: Not on file  . Attends Religious Services: Not on file  .  Active Member of Clubs or Organizations: Not on file  . Attends Archivist Meetings: Not on file  . Marital Status: Not on file  Intimate Partner Violence:   . Fear of Current or Ex-Partner: Not on file  . Emotionally Abused: Not on file  . Physically Abused: Not on file  . Sexually Abused: Not on file    Family History  Problem Relation Age of Onset  . Coronary artery disease Other   . Colon cancer Mother   . Lung cancer Mother   . Colon cancer Father   . Early death Neg Hx   . Hearing loss Neg Hx   . Heart disease Neg Hx   . Hyperlipidemia Neg Hx   . Hypertension Neg Hx   . Kidney disease Neg Hx   . Stroke Neg Hx     ROS- All systems are reviewed and negative except as per the HPI above  Physical Exam: Vitals:   11/11/19 1034  BP: 130/76  Pulse: 79  Weight: 71 kg  Height: 5\' 6"  (1.676 m)    GEN- The patient is well appearing, alert and oriented x 3 today.   Head- normocephalic, atraumatic Eyes-  Sclera clear, conjunctiva pink Ears- hearing intact Oropharynx- clear Neck- supple, no JVP Lymph- no cervical lymphadenopathy Lungs- Clear to ausculation bilaterally, normal work of breathing Heart- irregular rate and rhythm, no murmurs, rubs or gallops, PMI  not laterally displaced GI- soft, NT, ND, + BS Extremities- no clubbing, cyanosis, or edema MS- no significant deformity or atrophy Skin- no rash or lesion Psych- euthymic mood, full affect Neuro- strength and sensation are intact  EKG- atrial flutter at 79 bpm,  qrs int 96 ms, qtc 449 ms   Assessment and Plan:  1. Persistent flutter  S/p ablation 03/2010 and 11/21/16 Persistent afib since Tuesdat with CVR's Discussed just cardioverted in [place] I discussed with Dr. [name]  After cardioversion, will update echo and will refer back  to him to see if a 3rd ablation  Would be doable  Continue norpace as prescribed  Continue Eliquis 5 mg bid with chadsvasc score of 3, no missed doses for at least 3 weeks Continue metoprolol tartrate to 25 mg  bid    Cbc/bmet/covid today  Echo after CV and then f/u with Dr. Lawrence Marseilles C. Kenniel Bergsma, [facility] [address] [place], [place] [address] [phone]

## 2019-11-11 NOTE — Progress Notes (Signed)
Patient ID: Ronnie Black, male   DOB: 02-Nov-1945, 74 y.o.   MRN: [id number]     Primary Care Physician: [name], MD Referring Physician: Dr. 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 He has had afib/flutter ablation in 2012  and afib ablation fall of 2018. He was on norpace at one time, changed to amiodarone which was stopped shortly after last ablation, then back to Norpace.   He has done well the majority of this year but needed CV in July which was successful. HE nos has been out of rhythm since Tuesday. He has had his covid shots, no missed anticoagulation for at least 3 weeks. He wishes to be cardioverted.   Today, he denies symptoms of  chest pain, shortness of breath, orthopnea, PND, lower extremity edema, dizziness, presyncope, syncope, or neurologic sequela.  .The patient is tolerating medications without difficulties and is otherwise without complaint today.   Past Medical History:  Diagnosis Date  . Current use of long term anticoagulation 11/06/2018  . DDD (degenerative disc disease)   . HLD (hyperlipidemia)   . Hypertension   . 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 Persistent atrial fibrillation ([place])    s/p PVI 3/12   Past Surgical History:  Procedure Laterality Date  . APPENDECTOMY    . atrial fibrillation ablation  03/27/10   PVI with CTI ablation by JA  . ATRIAL FIBRILLATION ABLATION N/A 11/21/2016   Procedure: ATRIAL FIBRILLATION ABLATION;  Surgeon: [name], MD;  Location: [place] [facility];  Service: Cardiovascular;  Laterality: N/A;  . CARDIOVERSION    . CARDIOVERSION  04/26/2011   Procedure: CARDIOVERSION;  Surgeon: Larey Dresser, MD;  Location: [place];  Service: Cardiovascular;  Laterality: N/A;  . CARDIOVERSION N/A 04/28/2012   Procedure: CARDIOVERSION;  Surgeon: Thayer Headings, MD;  Location: [place];  Service: Cardiovascular;  Laterality:  N/A;  . CARDIOVERSION N/A 07/28/2012   Procedure: CARDIOVERSION;  Surgeon: Lelon Perla, MD;  Location: [facility] ENDOSCOPY;  Service: Cardiovascular;  Laterality: N/A;  . CARDIOVERSION N/A 08/14/2012   Procedure: CARDIOVERSION;  Surgeon: Josue Hector, MD;  Location: [place];  Service: Cardiovascular;  Laterality: N/A;  . CARDIOVERSION N/A 08/11/2014   Procedure: CARDIOVERSION;  Surgeon: Fay Records, MD;  Location: [place];  Service: Cardiovascular;  Laterality: N/A;  . CARDIOVERSION N/A 08/26/2014   Procedure: CARDIOVERSION;  Surgeon: Sanda Klein, MD;  Location: [place];  Service: Cardiovascular;  Laterality: N/A;  . CARDIOVERSION N/A 10/10/2016   Procedure: CARDIOVERSION;  Surgeon: Larey Dresser, MD;  Location: [facility] ENDOSCOPY;  Service: Cardiovascular;  Laterality: N/A;  . CARDIOVERSION N/A 12/04/2016   Procedure: CARDIOVERSION;  Surgeon: Larey Dresser, MD;  Location: [facility] ENDOSCOPY;  Service: Cardiovascular;  Laterality: N/A;  . CARDIOVERSION N/A 06/09/2018   Procedure: CARDIOVERSION;  Surgeon: Dorothy Spark, MD;  Location: [facility] ENDOSCOPY;  Service: Cardiovascular;  Laterality: N/A;  . CARDIOVERSION N/A 08/17/2019   Procedure: CARDIOVERSION;  Surgeon: [name], MD;  Location: [place];  Service: Cardiovascular;  Laterality: N/A;  . SKIN CANCER EXCISION    . TEE WITHOUT CARDIOVERSION  04/26/2011   Procedure: TRANSESOPHAGEAL ECHOCARDIOGRAM (TEE);  Surgeon: Larey Dresser, MD;  Location: [facility] ENDOSCOPY;  Service: Cardiovascular;  Laterality: N/A;  to be done at 1330  . TEE WITHOUT CARDIOVERSION N/A 04/28/2012   Procedure: TRANSESOPHAGEAL ECHOCARDIOGRAM (TEE);  Surgeon:  Thayer Headings, MD;  Location: [place];  Service: Cardiovascular;  Laterality: N/A;  . WISDOM TOOTH EXTRACTION      Current Outpatient Medications  Medication Sig Dispense Refill  . atorvastatin (LIPITOR) 10 MG tablet TAKE 1 TABLET BY MOUTH EVERY DAY 90 tablet 1  . calcium carbonate (TUMS - DOSED  IN MG ELEMENTAL CALCIUM) 500 MG chewable tablet Chew 1 tablet by mouth daily as needed for indigestion or heartburn.    . disopyramide (NORPACE) 100 MG capsule Take 300 mg by mouth daily.    Marland Kitchen ELIQUIS 5 MG TABS tablet TAKE 1 TABLET BY MOUTH TWICE A DAY (Patient taking differently: Take 5 mg by mouth 2 (two) times daily. ) 180 tablet 1  . melatonin 5 MG TABS Take 5 mg by mouth at bedtime.    . metoprolol tartrate (LOPRESSOR) 25 MG tablet TAKE 1 TABLET BY MOUTH TWICE A DAY (Patient taking differently: Take 25 mg by mouth 2 (two) times daily. ) 180 tablet 2  . Multiple Vitamin (MULTIVITAMIN WITH MINERALS) TABS tablet Take 1 tablet by mouth 3 (three) times a week.    . sildenafil (VIAGRA) 25 MG tablet Take 25 mg by mouth as needed for erectile dysfunction.     . NORPACE CR 150 MG 12 hr capsule Take 150 mg by mouth 2 (two) times daily.  (Patient not taking: Reported on 11/11/2019)     No current facility-administered medications for this encounter.    No Known Allergies  Social History   Socioeconomic History  . Marital status: Married    Spouse name: Not on file  . Number of children: 2  . Years of education: Not on file  . Highest education level: Not on file  Occupational History  . Occupation: professor    Employer: [employer]  Tobacco Use  . Smoking status: Former Smoker    Packs/day: 1.00    Years: 15.00    Pack years: 15.00    Types: Cigarettes    Quit date: 11/28/1975    Years since quitting: 43.9  . Smokeless tobacco: Never Used  Vaping Use  . Vaping Use: Never used  Substance and Sexual Activity  . Alcohol use: Yes    Alcohol/week: 6.0 standard drinks    Types: 3 Glasses of wine, 3 Cans of beer per week    Comment: rare wine  . Drug use: No  . Sexual activity: Yes  Other Topics Concern  . 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 Difficulty of Paying Living Expenses: Not on file  Food Insecurity:   . Worried About Charity fundraiser in the Last Year: Not on file  . Ran Out of Food in the Last Year: Not on file  Transportation Needs:   . Lack of Transportation (Medical): Not on file  . Lack of Transportation (Non-Medical): Not on file  Physical Activity:   . Days of Exercise per Week: Not on file  . Minutes of Exercise per Session: Not on file  Stress:   . Feeling of Stress : Not on file  Social Connections:   . Frequency of Communication with Friends and Family: Not on file  . Frequency of Social Gatherings with Friends and Family: Not on file  . Attends Religious Services: Not on file  .  Active Member of Clubs or Organizations: Not on file  . Attends Archivist Meetings: Not on file  . Marital Status: Not on file  Intimate Partner Violence:   . Fear of Current or Ex-Partner: Not on file  . Emotionally Abused: Not on file  . Physically Abused: Not on file  . Sexually Abused: Not on file    Family History  Problem Relation Age of Onset  . Coronary artery disease Other   . Colon cancer Mother   . Lung cancer Mother   . Colon cancer Father   . Early death Neg Hx   . Hearing loss Neg Hx   . Heart disease Neg Hx   . Hyperlipidemia Neg Hx   . Hypertension Neg Hx   . Kidney disease Neg Hx   . Stroke Neg Hx     ROS- All systems are reviewed and negative except as per the HPI above  Physical Exam: Vitals:   11/11/19 1034  BP: 130/76  Pulse: 79  Weight: 71 kg  Height: 5\' 6"  (1.676 m)    GEN- The patient is well appearing, alert and oriented x 3 today.   Head- normocephalic, atraumatic Eyes-  Sclera clear, conjunctiva pink Ears- hearing intact Oropharynx- clear Neck- supple, no JVP Lymph- no cervical lymphadenopathy Lungs- Clear to ausculation bilaterally, normal work of breathing Heart- irregular rate and rhythm, no murmurs, rubs or gallops, PMI  not laterally displaced GI- soft, NT, ND, + BS Extremities- no clubbing, cyanosis, or edema MS- no significant deformity or atrophy Skin- no rash or lesion Psych- euthymic mood, full affect Neuro- strength and sensation are intact  EKG- atrial flutter at 79 bpm,  qrs int 96 ms, qtc 449 ms   Assessment and Plan:  1. Persistent flutter  S/p ablation 03/2010 and 11/21/16 Persistent afib since Tuesdat with CVR's Discussed just cardioverted in [place] I discussed with Dr. [name]  After cardioversion, will update echo and will refer back  to him to see if a 3rd ablation  Would be doable  Continue norpace as prescribed  Continue Eliquis 5 mg bid with chadsvasc score of 3, no missed doses for at least 3 weeks Continue metoprolol tartrate to 25 mg  bid    Cbc/bmet/covid today  Echo after CV and then f/u with Dr. Lawrence Marseilles C. Makinze Jani, [facility] [address] [place], [place] [address] [phone]

## 2019-11-16 ENCOUNTER — Other Ambulatory Visit (HOSPITAL_COMMUNITY)
Admission: RE | Admit: 2019-11-16 | Discharge: 2019-11-16 | Disposition: A | Discharge status: Home / Self Care | Payer: Medicare PPO | Source: Ambulatory Visit | Attending: Cardiology | Admitting: Cardiology

## 2019-11-16 DIAGNOSIS — Z20822 Contact with and (suspected) exposure to covid-19: Secondary | ICD-10-CM | POA: Insufficient documentation

## 2019-11-16 DIAGNOSIS — Z01812 Encounter for preprocedural laboratory examination: Secondary | ICD-10-CM | POA: Diagnosis present

## 2019-11-16 LAB — SARS CORONAVIRUS 2 (TAT 6-24 HRS): SARS Coronavirus 2: NEGATIVE

## 2019-11-18 ENCOUNTER — Ambulatory Visit (HOSPITAL_COMMUNITY)
Admission: RE | Admit: 2019-11-18 | Discharge: 2019-11-18 | Disposition: A | Discharge status: Home / Self Care | Payer: Medicare PPO | Attending: Cardiology | Admitting: Cardiology

## 2019-11-18 ENCOUNTER — Ambulatory Visit (HOSPITAL_COMMUNITY): Payer: Medicare PPO | Admitting: Anesthesiology

## 2019-11-18 ENCOUNTER — Other Ambulatory Visit: Payer: Self-pay

## 2019-11-18 ENCOUNTER — Encounter (HOSPITAL_COMMUNITY)
Admission: RE | Disposition: A | Discharge status: Home / Self Care | Payer: Self-pay | Source: Home / Self Care | Attending: Cardiology

## 2019-11-18 DIAGNOSIS — Z87891 Personal history of nicotine dependence: Secondary | ICD-10-CM | POA: Insufficient documentation

## 2019-11-18 DIAGNOSIS — I4819 Other persistent atrial fibrillation: Secondary | ICD-10-CM | POA: Diagnosis present

## 2019-11-18 DIAGNOSIS — Z7901 Long term (current) use of anticoagulants: Secondary | ICD-10-CM | POA: Insufficient documentation

## 2019-11-18 HISTORY — PX: CARDIOVERSION: SHX1299

## 2019-11-18 SURGERY — CARDIOVERSION
Anesthesia: General

## 2019-11-18 MED ORDER — SODIUM CHLORIDE 0.9 % IV SOLN: INTRAVENOUS | Status: DC | PRN

## 2019-11-18 MED ORDER — PROPOFOL 10 MG/ML IV BOLUS
INTRAVENOUS | Status: DC | PRN
  Administered 2019-11-18: 75 mg via INTRAVENOUS

## 2019-11-18 MED ORDER — LIDOCAINE 2% (20 MG/ML) 5 ML SYRINGE
INTRAMUSCULAR | Status: DC | PRN
  Administered 2019-11-18: 60 mg via INTRAVENOUS

## 2019-11-18 NOTE — Transfer of Care (Signed)
Immediate Anesthesia Transfer of Care Note  Patient: Ronnie Black  Procedure(s) Performed: CARDIOVERSION (N/A )  Patient Location: Endoscopy Unit  Anesthesia Type:General  Level of Consciousness: drowsy  Airway & Oxygen Therapy: Patient Spontanous Breathing  Post-op Assessment: Report given to RN and Post -op Vital signs reviewed and stable  Post vital signs: Reviewed and stable  Last Vitals:  Vitals Value Taken Time  BP    Temp    Pulse    Resp    SpO2      Last Pain:  Vitals:   11/18/19 1150  TempSrc: Temporal  PainSc: 0-No pain         Complications: No complications documented.

## 2019-11-18 NOTE — Discharge Instructions (Signed)
Electrical Cardioversion Electrical cardioversion is the delivery of a jolt of electricity to restore a normal rhythm to the heart. A rhythm that is too fast or is not regular keeps the heart from pumping well. In this procedure, sticky patches or metal paddles are placed on the chest to deliver electricity to the heart from a device. This procedure may be done in an emergency if:  There is low or no blood pressure as a result of the heart rhythm.  Normal rhythm must be restored as fast as possible to protect the brain and heart from further damage.  It may save a life. This may also be a scheduled procedure for irregular or fast heart rhythms that are not immediately life-threatening. Tell a health care provider about:  Any allergies you have.  All medicines you are taking, including vitamins, herbs, eye drops, creams, and over-the-counter medicines.  Any problems you or family members have had with anesthetic medicines.  Any blood disorders you have.  Any surgeries you have had.  Any medical conditions you have.  Whether you are pregnant or may be pregnant. What are the risks? Generally, this is a safe procedure. However, problems may occur, including:  Allergic reactions to medicines.  A blood clot that breaks free and travels to other parts of your body.  The possible return of an abnormal heart rhythm within hours or days after the procedure.  Your heart stopping (cardiac arrest). This is rare. What happens before the procedure? Medicines  Your health care provider may have you start taking: ? Blood-thinning medicines (anticoagulants) so your blood does not clot as easily. ? Medicines to help stabilize your heart rate and rhythm.  Ask your health care provider about: ? Changing or stopping your regular medicines. This is especially important if you are taking diabetes medicines or blood thinners. ? Taking medicines such as aspirin and ibuprofen. These medicines can  thin your blood. Do not take these medicines unless your health care provider tells you to take them. ? Taking over-the-counter medicines, vitamins, herbs, and supplements. General instructions  Follow instructions from your health care provider about eating or drinking restrictions.  Plan to have someone take you home from the hospital or clinic.  If you will be going home right after the procedure, plan to have someone with you for 24 hours.  Ask your health care provider what steps will be taken to help prevent infection. These may include washing your skin with a germ-killing soap. What happens during the procedure?   An IV will be inserted into one of your veins.  Sticky patches (electrodes) or metal paddles may be placed on your chest.  You will be given a medicine to help you relax (sedative).  An electrical shock will be delivered. The procedure may vary among health care providers and hospitals. What can I expect after the procedure?  Your blood pressure, heart rate, breathing rate, and blood oxygen level will be monitored until you leave the hospital or clinic.  Your heart rhythm will be watched to make sure it does not change.  You may have some redness on the skin where the shocks were given. Follow these instructions at home:  Do not drive for 24 hours if you were given a sedative during your procedure.  Take over-the-counter and prescription medicines only as told by your health care provider.  Ask your health care provider how to check your pulse. Check it often.  Rest for 48 hours after the procedure or   as told by your health care provider.  Avoid or limit your caffeine use as told by your health care provider.  Keep all follow-up visits as told by your health care provider. This is important. Contact a health care provider if:  You feel like your heart is beating too quickly or your pulse is not regular.  You have a serious muscle cramp that does not go  away. Get help right away if:  You have discomfort in your chest.  You are dizzy or you feel faint.  You have trouble breathing or you are short of breath.  Your speech is slurred.  You have trouble moving an arm or leg on one side of your body.  Your fingers or toes turn cold or blue. Summary  Electrical cardioversion is the delivery of a jolt of electricity to restore a normal rhythm to the heart.  This procedure may be done right away in an emergency or may be a scheduled procedure if the condition is not an emergency.  Generally, this is a safe procedure.  After the procedure, check your pulse often as told by your health care provider. This information is not intended to replace advice given to you by your health care provider. Make sure you discuss any questions you have with your health care provider. Document Revised: 08/10/2018 Document Reviewed: 08/10/2018 Elsevier Patient Education  2020 Elsevier Inc.  

## 2019-11-18 NOTE — Interval H&P Note (Signed)
History and Physical Interval Note:  11/18/2019 12:11 PM  Ronnie Black  has presented today for surgery, with the diagnosis of AFIB.  The various methods of treatment have been discussed with the patient and family. After consideration of risks, benefits and other options for treatment, the patient has consented to  Procedure(s): CARDIOVERSION (N/A) as a surgical intervention.  The patient's history has been reviewed, patient examined, no change in status, stable for surgery.  I have reviewed the patient's chart and labs.  Questions were answered to the patient's satisfaction.     [name]

## 2019-11-18 NOTE — Procedures (Signed)
Procedure: Electrical Cardioversion Indications:  Atrial Fibrillation  Procedure Details:  Consent: Risks of procedure as well as the alternatives and risks of each were explained to the (patient/caregiver).  Consent for procedure obtained.  Time Out: Verified patient identification, verified procedure, site/side was marked, verified correct patient position, special equipment/implants available, medications/allergies/relevent history reviewed, required imaging and test results available. PERFORMED.  Patient placed on cardiac monitor, pulse oximetry, supplemental oxygen as necessary.  Sedation given: lidocaine 60mg  and propofol 75mg  administered by anesthesia. Pacer pads placed anterior and posterior chest.  Cardioverted 1 time(s).  Cardioversion with synchronized biphasic 120J shock.  Evaluation: Findings: Post procedure EKG shows: NSR Complications: None Patient did tolerate procedure well.  Time Spent Directly with the Patient:  75minutes   [name] 11/18/2019, 12:34 PM

## 2019-11-18 NOTE — Anesthesia Preprocedure Evaluation (Addendum)
Anesthesia Evaluation  Patient identified by MRN, date of birth, ID band Patient awake    Reviewed: Allergy & Precautions, NPO status , Patient's Chart, lab work & pertinent test results  Airway Mallampati: III  TM Distance: >3 FB Neck ROM: Full    Dental no notable dental hx.    Pulmonary former smoker,    Pulmonary exam normal breath sounds clear to auscultation       Cardiovascular hypertension, Pt. on home beta blockers +CHF  + dysrhythmias Atrial Fibrillation  Rhythm:Irregular Rate:Normal     Neuro/Psych negative neurological ROS  negative psych ROS   GI/Hepatic negative GI ROS, (+)     substance abuse  ,   Endo/Other  negative endocrine ROS  Renal/GU negative Renal ROS     Musculoskeletal  (+) Arthritis ,   Abdominal   Peds  Hematology HLD   Anesthesia Other Findings A-FIB  Reproductive/Obstetrics                            Anesthesia Physical Anesthesia Plan  ASA: III  Anesthesia Plan: General   Post-op Pain Management:    Induction: Intravenous  PONV Risk Score and Plan: 2 and Propofol infusion and Treatment may vary due to age or medical condition  Airway Management Planned: Mask  Additional Equipment:   Intra-op Plan:   Post-operative Plan:   Informed Consent: I have reviewed the patients History and Physical, chart, labs and discussed the procedure including the risks, benefits and alternatives for the proposed anesthesia with the patient or authorized representative who has indicated his/her understanding and acceptance.       Plan Discussed with: CRNA  Anesthesia Plan Comments:        Anesthesia Quick Evaluation

## 2019-11-18 NOTE — Anesthesia Postprocedure Evaluation (Signed)
Anesthesia Post Note  Patient: Ronnie Black  Procedure(s) Performed: CARDIOVERSION (N/A )     Patient location during evaluation: PACU Anesthesia Type: General Level of consciousness: awake Pain management: pain level controlled Vital Signs Assessment: post-procedure vital signs reviewed and stable Respiratory status: spontaneous breathing, nonlabored ventilation, respiratory function stable and patient connected to nasal cannula oxygen Cardiovascular status: blood pressure returned to baseline and stable Postop Assessment: no apparent nausea or vomiting Anesthetic complications: no   No complications documented.  Last Vitals:  Vitals:   11/18/19 1300 11/18/19 1310  BP: 120/80 112/84  Pulse: (!) 56 64  Resp: 15 16  Temp:    SpO2: 99% 98%    Last Pain:  Vitals:   11/18/19 1310  TempSrc:   PainSc: 0-No pain                 Paiton Boultinghouse P Leotta Weingarten

## 2019-11-19 ENCOUNTER — Other Ambulatory Visit: Payer: Self-pay | Admitting: Internal Medicine

## 2019-11-19 NOTE — Telephone Encounter (Signed)
Age 74, weight 68kg, SCr 0.99 on 11/11/19 afib indication, last visit Oct 2021

## 2019-11-21 ENCOUNTER — Encounter (HOSPITAL_COMMUNITY): Payer: Self-pay | Admitting: Cardiology

## 2019-11-23 ENCOUNTER — Other Ambulatory Visit (HOSPITAL_COMMUNITY): Payer: Self-pay | Admitting: *Deleted

## 2019-11-23 ENCOUNTER — Other Ambulatory Visit: Payer: Self-pay

## 2019-11-23 ENCOUNTER — Ambulatory Visit (HOSPITAL_COMMUNITY)
Admission: RE | Admit: 2019-11-23 | Discharge: 2019-11-23 | Disposition: A | Discharge status: Home / Self Care | Payer: Medicare PPO | Source: Ambulatory Visit | Attending: Nurse Practitioner | Admitting: Nurse Practitioner

## 2019-11-23 DIAGNOSIS — I4891 Unspecified atrial fibrillation: Secondary | ICD-10-CM | POA: Diagnosis not present

## 2019-11-23 DIAGNOSIS — I421 Obstructive hypertrophic cardiomyopathy: Secondary | ICD-10-CM | POA: Diagnosis not present

## 2019-11-23 DIAGNOSIS — E785 Hyperlipidemia, unspecified: Secondary | ICD-10-CM | POA: Insufficient documentation

## 2019-11-23 DIAGNOSIS — I4819 Other persistent atrial fibrillation: Secondary | ICD-10-CM | POA: Diagnosis not present

## 2019-11-23 NOTE — Progress Notes (Signed)
  Echocardiogram 2D Echocardiogram has been performed.  Ronnie Black 11/23/2019, 1:47 PM

## 2019-11-24 ENCOUNTER — Encounter (HOSPITAL_COMMUNITY): Payer: Self-pay | Admitting: *Deleted

## 2019-12-01 ENCOUNTER — Other Ambulatory Visit: Payer: Self-pay

## 2019-12-01 ENCOUNTER — Encounter: Payer: Self-pay | Admitting: Family Medicine

## 2019-12-01 ENCOUNTER — Ambulatory Visit (INDEPENDENT_AMBULATORY_CARE_PROVIDER_SITE_OTHER): Payer: Medicare PPO | Admitting: Family Medicine

## 2019-12-01 VITALS — BP 130/82 | HR 73 | Temp 97.9°F | Ht 66.0 in

## 2019-12-01 DIAGNOSIS — I421 Obstructive hypertrophic cardiomyopathy: Secondary | ICD-10-CM | POA: Diagnosis not present

## 2019-12-01 DIAGNOSIS — E782 Mixed hyperlipidemia: Secondary | ICD-10-CM

## 2019-12-01 DIAGNOSIS — I10 Essential (primary) hypertension: Secondary | ICD-10-CM

## 2019-12-01 DIAGNOSIS — Z0000 Encounter for general adult medical examination without abnormal findings: Secondary | ICD-10-CM

## 2019-12-01 DIAGNOSIS — N400 Enlarged prostate without lower urinary tract symptoms: Secondary | ICD-10-CM

## 2019-12-01 DIAGNOSIS — Z7901 Long term (current) use of anticoagulants: Secondary | ICD-10-CM | POA: Diagnosis not present

## 2019-12-01 DIAGNOSIS — I4819 Other persistent atrial fibrillation: Secondary | ICD-10-CM

## 2019-12-01 DIAGNOSIS — Z800 Family history of malignant neoplasm of digestive organs: Secondary | ICD-10-CM

## 2019-12-01 LAB — COMPLETE METABOLIC PANEL WITH GFR
AG Ratio: 2.4 (calc) (ref 1.0–2.5)
ALT: 26 U/L (ref 9–46)
AST: 21 U/L (ref 10–35)
Albumin: 4.5 g/dL (ref 3.6–5.1)
Alkaline phosphatase (APISO): 64 U/L (ref 35–144)
BUN: 14 mg/dL (ref 7–25)
CO2: 31 mmol/L (ref 20–32)
Calcium: 9.7 mg/dL (ref 8.6–10.3)
Chloride: 102 mmol/L (ref 98–110)
Creat: 0.95 mg/dL (ref 0.70–1.18)
GFR, Est African American: 90 mL/min/{1.73_m2} (ref >=60)
GFR, Est Non African American: 77 mL/min/{1.73_m2} (ref >=60)
Globulin: 1.9 g/dL (calc) (ref 1.9–3.7)
Glucose, Bld: 79 mg/dL (ref 65–99)
Potassium: 4.8 mmol/L (ref 3.5–5.3)
Sodium: 139 mmol/L (ref 135–146)
Total Bilirubin: 1.3 mg/dL — ABNORMAL HIGH (ref 0.2–1.2)
Total Protein: 6.4 g/dL (ref 6.1–8.1)

## 2019-12-01 LAB — LIPID PANEL
Cholesterol: 156 mg/dL (ref <200)
HDL: 40 mg/dL (ref >=40)
LDL Cholesterol (Calc): 99 mg/dL (calc)
Non-HDL Cholesterol (Calc): 116 mg/dL (calc) (ref <130)
Total CHOL/HDL Ratio: 3.9 (calc) (ref <5.0)
Triglycerides: 83 mg/dL (ref <150)

## 2019-12-01 LAB — CBC WITH DIFFERENTIAL/PLATELET
Absolute Monocytes: 554 cells/uL (ref 200–950)
Basophils Absolute: 28 cells/uL (ref 0–200)
Basophils Relative: 0.4 %
Eosinophils Absolute: 149 cells/uL (ref 15–500)
Eosinophils Relative: 2.1 %
HCT: 46.9 % (ref 38.5–50.0)
Hemoglobin: 15.8 g/dL (ref 13.2–17.1)
Lymphs Abs: 2599 cells/uL (ref 850–3900)
MCH: 29.4 pg (ref 27.0–33.0)
MCHC: 33.7 g/dL (ref 32.0–36.0)
MCV: 87.3 fL (ref 80.0–100.0)
MPV: 11.4 fL (ref 7.5–12.5)
Monocytes Relative: 7.8 %
Neutro Abs: 3770 cells/uL (ref 1500–7800)
Neutrophils Relative %: 53.1 %
Platelets: 211 10*3/uL (ref 140–400)
RBC: 5.37 10*6/uL (ref 4.20–5.80)
RDW: 12.8 % (ref 11.0–15.0)
Total Lymphocyte: 36.6 %
WBC: 7.1 10*3/uL (ref 3.8–10.8)

## 2019-12-01 LAB — TSH: TSH: 3.48 mIU/L (ref 0.40–4.50)

## 2019-12-01 LAB — PSA: PSA: 4.95 ng/mL — ABNORMAL HIGH (ref <=4.0)

## 2019-12-01 NOTE — Patient Instructions (Signed)
Please return in 12 months for your annual complete physical; please come fasting.  I will release your lab results to you on your MyChart account with further instructions. Please reply with any questions.    If you have any questions or concerns, please don't hesitate to send me a message via MyChart or call the office at [phone]. Thank you for visiting with [name] today! It's our pleasure caring for you.   Preventive Care 63 Years and Older, Male Preventive care refers to lifestyle choices and visits with your health care provider that can promote health and wellness. This includes:  A yearly physical exam. This is also called an annual well check.  Regular dental and eye exams.  Immunizations.  Screening for certain conditions.  Healthy lifestyle choices, such as diet and exercise. What can I expect for my preventive care visit? Physical exam Your health care provider will check:  Height and weight. These may be used to calculate body mass index (BMI), which is a measurement that tells if you are at a healthy weight.  Heart rate and blood pressure.  Your skin for abnormal spots. Counseling Your health care provider may ask you questions about:  Alcohol, tobacco, and drug use.  Emotional well-being.  Home and relationship well-being.  Sexual activity.  Eating habits.  History of falls.  Memory and ability to understand (cognition).  Work and work Statistician. What immunizations do I need?  Influenza (flu) vaccine  This is recommended every year. Tetanus, diphtheria, and pertussis (Tdap) vaccine  You may need a Td booster every 10 years. Varicella (chickenpox) vaccine  You may need this vaccine if you have not already been vaccinated. Zoster (shingles) vaccine  You may need this after age 67. Pneumococcal conjugate (PCV13) vaccine  One dose is recommended after age 52. Pneumococcal polysaccharide (PPSV23) vaccine  One dose is recommended after age  54. Measles, mumps, and rubella (MMR) vaccine  You may need at least one dose of MMR if you were born in 1957 or later. You may also need a second dose. Meningococcal conjugate (MenACWY) vaccine  You may need this if you have certain conditions. Hepatitis A vaccine  You may need this if you have certain conditions or if you travel or work in places where you may be exposed to hepatitis A. Hepatitis B vaccine  You may need this if you have certain conditions or if you travel or work in places where you may be exposed to hepatitis B. Haemophilus influenzae type b (Hib) vaccine  You may need this if you have certain conditions. You may receive vaccines as individual doses or as more than one vaccine together in one shot (combination vaccines). Talk with your health care provider about the risks and benefits of combination vaccines. What tests do I need? Blood tests  Lipid and cholesterol levels. These may be checked every 5 years, or more frequently depending on your overall health.  Hepatitis C test.  Hepatitis B test. Screening  Lung cancer screening. You may have this screening every year starting at age 84 if you have a 30-pack-year history of smoking and currently smoke or have quit within the past 15 years.  Colorectal cancer screening. All adults should have this screening starting at age 86 and continuing until age 27. Your health care provider may recommend screening at age 58 if you are at increased risk. You will have tests every 1-10 years, depending on your results and the type of screening test.  Prostate cancer  screening. Recommendations will vary depending on your family history and other risks.  Diabetes screening. This is done by checking your blood sugar (glucose) after you have not eaten for a while (fasting). You may have this done every 1-3 years.  Abdominal aortic aneurysm (AAA) screening. You may need this if you are a current or former smoker.  Sexually  transmitted disease (STD) testing. Follow these instructions at home: Eating and drinking  Eat a diet that includes fresh fruits and vegetables, whole grains, lean protein, and low-fat dairy products. Limit your intake of foods with high amounts of sugar, saturated fats, and salt.  Take vitamin and mineral supplements as recommended by your health care provider.  Do not drink alcohol if your health care provider tells you not to drink.  If you drink alcohol: ? Limit how much you have to 0-2 drinks a day. ? Be aware of how much alcohol is in your drink. In the [place] one drink equals one 12 oz bottle of beer (355 mL), one 5 oz glass of wine (148 mL), or one 1 oz glass of hard liquor (44 mL). Lifestyle  Take daily care of your teeth and gums.  Stay active. Exercise for at least 30 minutes on 5 or more days each week.  Do not use any products that contain nicotine or tobacco, such as cigarettes, e-cigarettes, and chewing tobacco. If you need help quitting, ask your health care provider.  If you are sexually active, practice safe sex. Use a condom or other form of protection to prevent STIs (sexually transmitted infections).  Talk with your health care provider about taking a low-dose aspirin or statin. What's next?  Visit your health care provider once a year for a well check visit.  Ask your health care provider how often you should have your eyes and teeth checked.  Stay up to date on all vaccines. This information is not intended to replace advice given to you by your health care provider. Make sure you discuss any questions you have with your health care provider. Document Revised: 01/01/2018 Document Reviewed: 01/01/2018 Elsevier Patient Education  2020 [employer].

## 2019-12-01 NOTE — Progress Notes (Signed)
Subjective  Chief Complaint  Patient presents with  . Annual Exam    fasting ,  . Labs Only    patient states that he wants a PSA test done with his lab work ?    HPI: Ronnie Black is a 74 y.o. male who presents to [facility] today for a Male Wellness Visit. He also has the concerns and/or needs as listed above in the chief complaint. These will be addressed in addition to the Health Maintenance Visit.   Wellness Visit: annual visit with health maintenance review and exam    HM last colonoscopy was 2018.  Gets every 5 years due to family history of colon cancer.  No history of polyps.  Eye exam up-to-date.  Immunizations are up-to-date.  Feels well.  No new concerns Lifestyle: Body mass index is 24.21 kg/m. Wt Readings from Last 3 Encounters:  11/18/19 150 lb (68 kg)  11/11/19 156 lb 9.6 oz (71 kg)  08/24/19 153 lb 12.8 oz (69.8 kg)     Chronic disease management visit and/or acute problem visit:  Persistent A. fib, status post cardioversion.  Feels he is in and out of rhythm.  He has follow-up with cardiology tomorrow.  Blood pressure has been well controlled.  Hypertension hyperlipidemia due for lab recheck.  Both well controlled.  Tolerates medications.  No chest pain or palpitations.  No symptoms of heart failure.  History of BPH without symptoms.  Due for recheck.  He has seen neurology.  Patient Active Problem List   Diagnosis Date Noted  . Current use of long term anticoagulation 11/06/2018  . Atrial fibrillation status post cardioversion ([place]) 02/24/2017  . Mixed hyperlipidemia 08/13/2008  . HOCM (hypertrophic obstructive cardiomyopathy) ([place]) 08/13/2008  . Benign prostatic hyperplasia without lower urinary tract symptoms 11/07/2016  . Herniated lumbar disc without myelopathy 07/08/2012  . ED (erectile dysfunction) 01/07/2012  . Actinic keratosis 01/07/2012  . Seborrheic keratosis 01/07/2012  . CAROTID BRUIT 08/24/2008  .  Secondary hypercoagulable state ([place]) 08/24/2019  . Persistent atrial fibrillation ([place]) 11/21/2016  . Family history of colon cancer 10/01/2012   Health Maintenance  Topic Date Due  . COLONOSCOPY  05/07/2022  . TETANUS/TDAP  11/08/2026  . INFLUENZA VACCINE  Completed  . COVID-19 Vaccine  Completed  . Hepatitis C Screening  Completed  . PNA vac Low Risk Adult  Completed   Immunization History  Administered Date(s) Administered  . Fluad Quad(high Dose 65+) 11/02/2019  . Influenza, High Dose Seasonal PF 11/02/2015, 11/07/2016, 11/13/2017  . Influenza, Seasonal, Injecte, Preservative Fre 11/04/2013  . Moderna SARS-COVID-2 Vaccination 03/03/2019, 03/24/2019  . Pneumococcal Conjugate-13 12/02/2013  . Pneumococcal Polysaccharide-23 01/07/2012  . Tdap 11/07/2016  . Zoster 01/22/2010  . Zoster Recombinat (Shingrix) 12/04/2018, 06/03/2019   We updated and reviewed the patient's past history in detail and it is documented below. Allergies: Patient has No Known Allergies. Past Medical History  has a past medical history of Current use of long term anticoagulation (11/06/2018), DDD (degenerative disc disease), HLD (hyperlipidemia), Hypertension, Hypertr obst cardiomyop, and Persistent atrial fibrillation ([place]). Past Surgical History Patient  has a past surgical history that includes Appendectomy; atrial fibrillation ablation (03/27/10); Wisdom tooth extraction; Skin cancer excision; Cardioversion; TEE without cardioversion (04/26/2011); Cardioversion (04/26/2011); TEE without cardioversion (N/A, 04/28/2012); Cardioversion (N/A, 04/28/2012); Cardioversion (N/A, 07/28/2012); Cardioversion (N/A, 08/14/2012); Cardioversion (N/A, 08/11/2014); Cardioversion (N/A, 08/26/2014); Cardioversion (N/A, 10/10/2016); ATRIAL FIBRILLATION ABLATION (N/A, 11/21/2016); Cardioversion (N/A, 12/04/2016); Cardioversion (N/A, 06/09/2018); Cardioversion (N/A, 08/17/2019); and Cardioversion (  N/A, 11/18/2019). Social History Patient  reports  that he quit smoking about 44 years ago. His smoking use included cigarettes. He has a 15.00 pack-year smoking history. He has never used smokeless tobacco. He reports current alcohol use of about 6.0 standard drinks of alcohol per week. He reports that he does not use drugs. Family History family history includes Colon cancer in his father and mother; Coronary artery disease in an other family member; Lung cancer in his mother. Review of Systems: Constitutional: negative for fever or malaise Ophthalmic: negative for photophobia, double vision or loss of vision Cardiovascular: negative for chest pain, dyspnea on exertion, or new LE swelling Respiratory: negative for SOB or persistent cough Gastrointestinal: negative for abdominal pain, change in bowel habits or melena Genitourinary: negative for dysuria or gross hematuria Musculoskeletal: negative for new gait disturbance or muscular weakness Integumentary: negative for new or persistent rashes Neurological: negative for TIA or stroke symptoms Psychiatric: negative for SI or delusions Allergic/Immunologic: negative for hives  Patient Care Team    Relationship Specialty Notifications Start End  [name], MD PCP - General Family Medicine  11/06/18   [name], MD  Cardiology  12/10/10   [facility] Consulting Physician Urology  11/06/18   Laurin Coder, MD Consulting Physician Pulmonary Disease  11/06/18   Richmond Campbell, MD Consulting Physician Gastroenterology  12/01/19    Objective  Vitals: BP (!) 142/82   Pulse 73   Temp 97.9 F (36.6 C)   Ht 5\' 6"  (1.676 m)   SpO2 98%   BMI 24.21 kg/m  General:  Well developed, well nourished, no acute distress  Psych:  Alert and orientedx3,normal mood and affect HEENT:  Normocephalic, atraumatic, non-icteric sclera, PERRL, oropharynx is clear without mass or exudate, supple neck without adenopathy, mass or thyromegaly Cardiovascular:  Normal S1, S2, RRR  without gallop, rub or murmur, nondisplaced PMI, +2 distal pulses in bilateral upper and lower extremities. Respiratory:  Good breath sounds bilaterally, CTAB with normal respiratory effort Gastrointestinal: normal bowel sounds, soft, non-tender, no noted masses. No HSM MSK: no deformities, contusions. Joints are without erythema or swelling. Spine and CVA region are nontender Skin:  Warm, no rashes or suspicious lesions noted Neurologic:    Mental status is normal.  Gross motor and sensory exams are normal. Stable gait. No tremor GU: No inguinal hernias or adenopathy are appreciated bilaterally   Assessment  1. Annual physical exam   2. Current use of long term anticoagulation   3. Mixed hyperlipidemia   4. HOCM (hypertrophic obstructive cardiomyopathy) ([place])   5. Benign prostatic hyperplasia without lower urinary tract symptoms   6. Persistent atrial fibrillation ([place])   7. Family history of colon cancer      Plan  Male Wellness Visit:  Age appropriate Health Maintenance and Prevention measures were discussed with patient. Included topics are cancer screening recommendations, ways to keep healthy (see AVS) including dietary and exercise recommendations, regular eye and dental care, use of seat belts, and avoidance of moderate alcohol use and tobacco use.   BMI: discussed patient's BMI and encouraged positive lifestyle modifications to help get to or maintain a target BMI.  HM needs and immunizations were addressed and ordered. See below for orders. See HM and immunization section for updates.  Routine labs and screening tests ordered including cmp, cbc and lipids where appropriate.  Discussed recommendations regarding Vit D and calcium supplementation (see AVS)  Chronic disease f/u and/or acute problem visit: (deemed necessary to  be done in addition to the wellness visit):  Follow-up with cardiology for hokum and A. fib.  Continue current medications.  Blood pressures controlled.   Check lipids.  bph w/ elevated psa; due for recheck. Remains asymptomatic. F/u with urology if worsening  Long term anticoagulation w/o complications.   Follow up: Return in about 1 year (around 11/30/2020) for complete physical.   Commons side effects, risks, benefits, and alternatives for medications and treatment plan prescribed today were discussed, and the patient expressed understanding of the given instructions. Patient is instructed to call or message via MyChart if he/she has any questions or concerns regarding our treatment plan. No barriers to understanding were identified. We discussed Red Flag symptoms and signs in detail. Patient expressed understanding regarding what to do in case of urgent or emergency type symptoms.   Medication list was reconciled, printed and provided to the patient in AVS. Patient instructions and summary information was reviewed with the patient as documented in the AVS. This note was prepared with assistance of Dragon voice recognition software. Occasional wrong-word or sound-a-like substitutions may have occurred due to the inherent limitations of voice recognition software  This visit occurred during the SARS-CoV-2 public health emergency.  Safety protocols were in place, including screening questions prior to the visit, additional usage of staff PPE, and extensive cleaning of exam room while observing appropriate contact time as indicated for disinfecting solutions.   Orders Placed This Encounter  Procedures  . CBC with Differential/Platelet  . COMPLETE METABOLIC PANEL WITH GFR  . Lipid panel  . TSH  . PSA   No orders of the defined types were placed in this encounter.

## 2019-12-02 ENCOUNTER — Encounter: Payer: Self-pay | Admitting: Internal Medicine

## 2019-12-02 ENCOUNTER — Ambulatory Visit: Payer: Medicare PPO | Admitting: Internal Medicine

## 2019-12-02 VITALS — BP 140/82 | HR 69 | Ht 66.0 in | Wt 157.0 lb

## 2019-12-02 DIAGNOSIS — I484 Atypical atrial flutter: Secondary | ICD-10-CM

## 2019-12-02 DIAGNOSIS — I422 Other hypertrophic cardiomyopathy: Secondary | ICD-10-CM

## 2019-12-02 DIAGNOSIS — I4819 Other persistent atrial fibrillation: Secondary | ICD-10-CM

## 2019-12-02 NOTE — Progress Notes (Signed)
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  Since last being seen in our clinic, the patient reports doing very well.  Today, he denies symptoms of palpitations, chest pain, shortness of breath,  lower extremity edema, dizziness, presyncope, or syncope.  The patient is otherwise without complaint today.   Past Medical History:  Diagnosis Date  . Current use of long term anticoagulation 11/06/2018  . DDD (degenerative disc disease)   . HLD (hyperlipidemia)   . Hypertension   . 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 Persistent atrial fibrillation ([place])    s/p PVI 3/12   Past Surgical History:  Procedure Laterality Date  . APPENDECTOMY    . atrial fibrillation ablation  03/27/10   PVI with CTI ablation by JA  . ATRIAL FIBRILLATION ABLATION N/A 11/21/2016   Procedure: ATRIAL FIBRILLATION ABLATION;  Surgeon: [name], MD;  Location: [place] [facility];  Service: Cardiovascular;  Laterality: N/A;  . CARDIOVERSION    . CARDIOVERSION  04/26/2011   Procedure: CARDIOVERSION;  Surgeon: Larey Dresser, MD;  Location: [facility];  Service: Cardiovascular;  Laterality: N/A;  . CARDIOVERSION N/A 04/28/2012   Procedure: CARDIOVERSION;  Surgeon: Thayer Headings, MD;  Location: [place];  Service: Cardiovascular;  Laterality: N/A;  . CARDIOVERSION N/A 07/28/2012   Procedure: CARDIOVERSION;  Surgeon: Lelon Perla, MD;  Location: [facility] ENDOSCOPY;  Service: Cardiovascular;  Laterality: N/A;  . CARDIOVERSION N/A 08/14/2012   Procedure: CARDIOVERSION;  Surgeon: Josue Hector, MD;  Location: [place];  Service: Cardiovascular;  Laterality: N/A;  . CARDIOVERSION N/A 08/11/2014   Procedure: CARDIOVERSION;  Surgeon: Fay Records, MD;  Location: [place];  Service: Cardiovascular;  Laterality: N/A;  . CARDIOVERSION N/A 08/26/2014   Procedure: CARDIOVERSION;  Surgeon: Sanda Klein, MD;  Location: [place];  Service: Cardiovascular;  Laterality: N/A;  . CARDIOVERSION N/A 10/10/2016   Procedure: CARDIOVERSION;  Surgeon: Larey Dresser, MD;  Location: [facility] ENDOSCOPY;  Service: Cardiovascular;  Laterality: N/A;  . CARDIOVERSION N/A 12/04/2016   Procedure: CARDIOVERSION;  Surgeon: Larey Dresser, MD;  Location: [facility] ENDOSCOPY;  Service: Cardiovascular;  Laterality: N/A;  . CARDIOVERSION N/A 06/09/2018   Procedure: CARDIOVERSION;  Surgeon: Dorothy Spark, MD;  Location: [facility] ENDOSCOPY;  Service: Cardiovascular;  Laterality: N/A;  . CARDIOVERSION N/A 08/17/2019   Procedure: CARDIOVERSION;  Surgeon: [name], MD;  Location: [place] ENDOSCOPY;  Service: Cardiovascular;  Laterality: N/A;  . CARDIOVERSION N/A 11/18/2019   Procedure: CARDIOVERSION;  Surgeon: [name], MD;  Location: [place];  Service: Cardiovascular;  Laterality: N/A;  . SKIN CANCER EXCISION    . TEE WITHOUT CARDIOVERSION  04/26/2011   Procedure: TRANSESOPHAGEAL ECHOCARDIOGRAM (TEE);  Surgeon: Larey Dresser, MD;  Location: [facility] ENDOSCOPY;  Service: Cardiovascular;  Laterality: N/A;  to be done at 1330  . TEE WITHOUT CARDIOVERSION N/A 04/28/2012   Procedure: TRANSESOPHAGEAL ECHOCARDIOGRAM (TEE);  Surgeon: Thayer Headings, MD;  Location: [place];  Service: Cardiovascular;  Laterality: N/A;  . WISDOM TOOTH EXTRACTION      ROS- all systems are reviewed and negatives except as per HPI above  Current Outpatient Medications  Medication Sig Dispense Refill  . atorvastatin (LIPITOR) 10 MG tablet TAKE 1 TABLET BY MOUTH EVERY DAY (Patient taking differently: Take 10 mg by mouth every evening. ) 90 tablet 1  . calcium carbonate (TUMS -  DOSED IN MG ELEMENTAL CALCIUM) 500 MG chewable tablet Chew 1 tablet by mouth daily as needed for indigestion or heartburn.    . disopyramide (NORPACE) 100 MG capsule Take 100 mg by mouth in the morning, at noon, and at bedtime.     Marland Kitchen ELIQUIS 5 MG TABS tablet  TAKE 1 TABLET BY MOUTH TWICE A DAY 180 tablet 1  . melatonin 5 MG TABS Take 5 mg by mouth at bedtime.    . metoprolol tartrate (LOPRESSOR) 25 MG tablet TAKE 1 TABLET BY MOUTH TWICE A DAY (Patient taking differently: Take 25 mg by mouth 2 (two) times daily. ) 180 tablet 2  . Multiple Vitamin (MULTIVITAMIN WITH MINERALS) TABS tablet Take 1 tablet by mouth 3 (three) times a week.    . sildenafil (VIAGRA) 25 MG tablet Take 25 mg by mouth as needed for erectile dysfunction.      No current facility-administered medications for this visit.    Physical Exam: Vitals:   12/02/19 1217  BP: 140/82  Pulse: 69  SpO2: 96%  Weight: 157 lb (71.2 kg)  Height: 5\' 6"  (1.676 m)    GEN- The patient is well appearing, alert and oriented x 3 today.   Head- normocephalic, atraumatic Eyes-  Sclera clear, conjunctiva pink Ears- hearing intact Oropharynx- clear Lungs- Clear to ausculation bilaterally, normal work of breathing Heart- Regular rate and rhythm, no murmurs, rubs or gallops, PMI not laterally displaced GI- soft, NT, ND, + BS Extremities- no clubbing, cyanosis, or edema  Wt Readings from Last 3 Encounters:  12/02/19 157 lb (71.2 kg)  11/18/19 150 lb (68 kg)  11/11/19 156 lb 9.6 oz (71 kg)    Echo 11/23/19- EF 60%, mild MR, no LVH, normal atrial size  EKG tracing ordered today is personally reviewed and shows sinus rhythm with diffuse TWI  Assessment and Plan:  1. Persistent afib/ atrial flutter He has done well post ablation 3/21 and 11/18 but now has recurrent arrhythmias The patient has symptomatic, recurrent atrial fibrillation. he has failed medical therapy with norpace.  We will follow him closely on this medicine to avoid toxicity. Chads2vasc score is 3.  he is anticoagulated with eliquis . Therapeutic strategies for afib including medicine (tikosyn, amiodarone) and ablation were discussed in detail with the patient today. Risk, benefits, and alternatives to each approach were  discussed.  For now, he would prefer to continue watchful waiting.  He may be more willing to consider ablation if his AF returns. I have reviewed his [employer] tracings today.  Multiple tracings say "possible AF" but are actually sinus.  I do not see any afib since his recent cardioversion.  2. Hypertrophic nonobstructive CM Stable No change required today Echo 11/23/19 is read as "no LVH" while echo 2020 is read as "severe LVH".  I will ask Dr Schuyler Amor to review the echos for me.  I do not feel that clinically an MRI is warranted at this time.   Risks, benefits and potential toxicities for medications prescribed and/or refilled reviewed with patient today.   Return to see me in 3 months  [name] MD, [facility] 12/02/2019 12:53 PM

## 2019-12-02 NOTE — Patient Instructions (Addendum)
Medication Instructions:  Your physician recommends that you continue on your current medications as directed. Please refer to the Current Medication list given to you today.  *If you need a refill on your cardiac medications before your next appointment, please call your pharmacy*  Lab Work: None ordered.  If you have labs (blood work) drawn today and your tests are completely normal, you will receive your results only by: Marland Kitchen MyChart Message (if you have MyChart) OR . A paper copy in the mail If you have any lab test that is abnormal or we need to change your treatment, we will call you to review the results.  Testing/Procedures: None ordered.  Follow-Up: At [facility], you and your health needs are our priority.  As part of our continuing mission to provide you with exceptional heart care, we have created designated Provider Care Teams.  These Care Teams include your primary Cardiologist (physician) and Advanced Practice Providers (APPs -  Physician Assistants and Nurse Practitioners) who all work together to provide you with the care you need, when you need it.  We recommend signing up for the patient portal called "MyChart".  Sign up information is provided on this After Visit Summary.  MyChart is used to connect with patients for Virtual Visits (Telemedicine).  Patients are able to view lab/test results, encounter notes, upcoming appointments, etc.  Non-urgent messages can be sent to your provider as well.   To learn more about what you can do with MyChart, go to [email or web address].    Your next appointment:   Your physician wants you to follow-up in: Feb with Dr. [name] as scheduled.     Other Instructions:

## 2019-12-16 LAB — ECHOCARDIOGRAM COMPLETE: S' Lateral: 2.9 cm

## 2019-12-21 ENCOUNTER — Encounter (HOSPITAL_COMMUNITY): Payer: Self-pay | Admitting: *Deleted

## 2020-01-10 ENCOUNTER — Other Ambulatory Visit (HOSPITAL_COMMUNITY): Payer: Self-pay | Admitting: Nurse Practitioner

## 2020-01-12 ENCOUNTER — Other Ambulatory Visit: Payer: Self-pay | Admitting: Internal Medicine

## 2020-01-12 MED ORDER — DISOPYRAMIDE PHOSPHATE 100 MG PO CAPS
100.0000 mg | ORAL_CAPSULE | Freq: Three times a day (TID) | ORAL | 3 refills | Status: DC
Start: 2020-01-12 — End: 2020-05-23

## 2020-03-02 ENCOUNTER — Other Ambulatory Visit: Payer: Self-pay

## 2020-03-02 ENCOUNTER — Encounter: Payer: Self-pay | Admitting: Internal Medicine

## 2020-03-02 ENCOUNTER — Ambulatory Visit: Payer: Medicare PPO | Admitting: Internal Medicine

## 2020-03-02 VITALS — BP 110/70 | HR 75 | Ht 66.0 in | Wt 154.0 lb

## 2020-03-02 DIAGNOSIS — I4819 Other persistent atrial fibrillation: Secondary | ICD-10-CM | POA: Diagnosis not present

## 2020-03-02 DIAGNOSIS — I422 Other hypertrophic cardiomyopathy: Secondary | ICD-10-CM | POA: Diagnosis not present

## 2020-03-02 DIAGNOSIS — I484 Atypical atrial flutter: Secondary | ICD-10-CM

## 2020-03-02 NOTE — Progress Notes (Signed)
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  Since last being seen in our clinic, the patient reports doing very well. He went into persistent afib this past week.  He has been in AF since. + palpitations and fatigue.  Today, he denies symptoms of palpitations, chest pain, shortness of breath,  lower extremity edema, dizziness, presyncope, or syncope.  The patient is otherwise without complaint today.   Past Medical History:  Diagnosis Date  . Current use of long term anticoagulation 11/06/2018  . DDD (degenerative disc disease)   . HLD (hyperlipidemia)   . Hypertension   . 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 Persistent atrial fibrillation ([place])    s/p PVI 3/12   Past Surgical History:  Procedure Laterality Date  . APPENDECTOMY    . atrial fibrillation ablation  03/27/10   PVI with CTI ablation by JA  . ATRIAL FIBRILLATION ABLATION N/A 11/21/2016   Procedure: ATRIAL FIBRILLATION ABLATION;  Surgeon: [name], MD;  Location: [place] [facility];  Service: Cardiovascular;  Laterality: N/A;  . CARDIOVERSION    . CARDIOVERSION  04/26/2011   Procedure: CARDIOVERSION;  Surgeon: Larey Dresser, MD;  Location[place];  Service: Cardiovascular;  Laterality: N/A;  . CARDIOVERSION N/A 04/28/2012   Procedure: CARDIOVERSION;  Surgeon: Thayer Headings, MD;  Location: [place];  Service: Cardiovascular;  Laterality: N/A;  . CARDIOVERSION N/A 07/28/2012   Procedure: CARDIOVERSION;  Surgeon: Lelon Perla, MD;  Location: [facility] ENDOSCOPY;  Service: Cardiovascular;  Laterality: N/A;  . CARDIOVERSION N/A 08/14/2012   Procedure: CARDIOVERSION;  Surgeon: Josue Hector, MD;  Location: [place];  Service: Cardiovascular;  Laterality: N/A;  . CARDIOVERSION N/A 08/11/2014   Procedure: CARDIOVERSION;  Surgeon: Fay Records, MD;  Location: [place];  Service:  Cardiovascular;  Laterality: N/A;  . CARDIOVERSION N/A 08/26/2014   Procedure: CARDIOVERSION;  Surgeon: Sanda Klein, MD;  Location: [place];  Service: Cardiovascular;  Laterality: N/A;  . CARDIOVERSION N/A 10/10/2016   Procedure: CARDIOVERSION;  Surgeon: Larey Dresser, MD;  Location: [facility] ENDOSCOPY;  Service: Cardiovascular;  Laterality: N/A;  . CARDIOVERSION N/A 12/04/2016   Procedure: CARDIOVERSION;  Surgeon: Larey Dresser, MD;  Location: [facility] ENDOSCOPY;  Service: Cardiovascular;  Laterality: N/A;  . CARDIOVERSION N/A 06/09/2018   Procedure: CARDIOVERSION;  Surgeon: Dorothy Spark, MD;  Location: [facility] ENDOSCOPY;  Service: Cardiovascular;  Laterality: N/A;  . CARDIOVERSION N/A 08/17/2019   Procedure: CARDIOVERSION;  Surgeon: [name], MD;  Location: [facility] ENDOSCOPY;  Service: Cardiovascular;  Laterality: N/A;  . CARDIOVERSION N/A 11/18/2019   Procedure: CARDIOVERSION;  Surgeon: [name], MD;  Location: [place];  Service: Cardiovascular;  Laterality: N/A;  . SKIN CANCER EXCISION    . TEE WITHOUT CARDIOVERSION  04/26/2011   Procedure: TRANSESOPHAGEAL ECHOCARDIOGRAM (TEE);  Surgeon: Larey Dresser, MD;  Location: [facility] END[facility]COPY;  Service: Cardiovascular;  Laterality: N/A;  to be done at 1330  . TEE WITHOUT CARDIOVERSION N/A 04/28/2012   Procedure: TRANSESOPHAGEAL ECHOCARDIOGRAM (TEE);  Surgeon: Thayer Headings, MD;  Location: [place];  Service: Cardiovascular;  Laterality: N/A;  . WISDOM TOOTH EXTRACTION      ROS- all systems are reviewed and negatives except as per HPI above  Current Outpatient Medications  Medication Sig Dispense Refill  . atorvastatin (LIPITOR) 10 MG tablet TAKE 1 TABLET BY MOUTH EVERY DAY (Patient  taking differently: Take 10 mg by mouth every evening.) 90 tablet 1  . calcium carbonate (TUMS - DOSED IN MG ELEMENTAL CALCIUM) 500 MG chewable tablet Chew 1 tablet by mouth daily as needed for indigestion or heartburn.    . disopyramide (NORPACE)  100 MG capsule Take 1 capsule (100 mg total) by mouth in the morning, at noon, and at bedtime. 270 capsule 3  . ELIQUIS 5 MG TABS tablet TAKE 1 TABLET BY MOUTH TWICE A DAY 180 tablet 1  . melatonin 5 MG TABS Take 5 mg by mouth at bedtime.    . metoprolol tartrate (LOPRESSOR) 25 MG tablet TAKE 1 TABLET BY MOUTH TWICE A DAY 180 tablet 2  . Multiple Vitamin (MULTIVITAMIN WITH MINERALS) TABS tablet Take 1 tablet by mouth 3 (three) times a week.    . sildenafil (VIAGRA) 25 MG tablet Take 25 mg by mouth as needed for erectile dysfunction.      No current facility-administered medications for this visit.    Physical Exam: Vitals:   03/02/20 0939  BP: 110/70  Pulse: 75  SpO2: 97%  Weight: 154 lb (69.9 kg)  Height: 5\' 6"  (1.676 m)    GEN- The patient is well appearing, alert and oriented x 3 today.   Head- normocephalic, atraumatic Eyes-  Sclera clear, conjunctiva pink Ears- hearing intact Oropharynx- clear Lungs-  normal work of breathing Heart- irregular rate and rhythm  GI- soft, NT, ND, + BS Extremities- no clubbing, cyanosis, or edema  Wt Readings from Last 3 Encounters:  03/02/20 154 lb (69.9 kg)  12/02/19 157 lb (71.2 kg)  11/18/19 150 lb (68 kg)    EKG tracing ordered today is personally reviewed and shows  Persistent afib, V rate 80s  Assessment and Plan:  1. Persistent afib/ atrial flutter S/p ablation 03/2010 and 11/2016 Now back in afib He has failed medical therapy with norpace  Chads2vasc score is 3.  he is anticoagulated with eliquis . Therapeutic strategies for afib including medicine (tikosyn, amiodarone) and ablation were discussed in detail with the patient today. Risk, benefits, and alternatives to EP study and radiofrequency ablation for afib were also discussed in detail today. These risks include but are not limited to stroke, bleeding, vascular damage, tamponade, perforation, damage to the esophagus, lungs, and other structures, pulmonary vein stenosis,  worsening renal function, and death. The patient understands these risk and wishes to think about options further. We will proceed with cardioversion.  We will have him follow-up in the AF clinic 1 week later for further considerations of AAD change vs ablation.  2. Hypertrophic nonobstructive CM Stable No change required today   Risks, benefits and potential toxicities for medications prescribed and/or refilled reviewed with patient today.   [name] MD, [facility] 03/02/2020 10:14 AM

## 2020-03-02 NOTE — H&P (View-Only) (Signed)
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  Since last being seen in our clinic, the patient reports doing very well. He went into persistent afib this past week.  He has been in AF since. + palpitations and fatigue.  Today, he denies symptoms of palpitations, chest pain, shortness of breath,  lower extremity edema, dizziness, presyncope, or syncope.  The patient is otherwise without complaint today.   Past Medical History:  Diagnosis Date  . Current use of long term anticoagulation 11/06/2018  . DDD (degenerative disc disease)   . HLD (hyperlipidemia)   . Hypertension   . 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 Persistent atrial fibrillation ([employer])    s/p PVI 3/12   Past Surgical History:  Procedure Laterality Date  . APPENDECTOMY    . atrial fibrillation ablation  03/27/10   PVI with CTI ablation by JA  . ATRIAL FIBRILLATION ABLATION N/A 11/21/2016   Procedure: ATRIAL FIBRILLATION ABLATION;  Surgeon: [name], MD;  Location: [place] [facility];  Service: Cardiovascular;  Laterality: N/A;  . CARDIOVERSION    . CARDIOVERSION  04/26/2011   Procedure: CARDIOVERSION;  Surgeon: Larey Dresser, MD;  Location: [place];  Service: Cardiovascular;  Laterality: N/A;  . CARDIOVERSION N/A 04/28/2012   Procedure: CARDIOVERSION;  Surgeon: Thayer Headings, MD;  Location: [place];  Service: Cardiovascular;  Laterality: N/A;  . CARDIOVERSION N/A 07/28/2012   Procedure: CARDIOVERSION;  Surgeon: Lelon Perla, MD;  Location: [facility] ENDOSCOPY;  Service: Cardiovascular;  Laterality: N/A;  . CARDIOVERSION N/A 08/14/2012   Procedure: CARDIOVERSION;  Surgeon: Josue Hector, MD;  Location: [place];  Service: Cardiovascular;  Laterality: N/A;  . CARDIOVERSION N/A 08/11/2014   Procedure: CARDIOVERSION;  Surgeon: Fay Records, MD;  Location: [place];  Service:  Cardiovascular;  Laterality: N/A;  . CARDIOVERSION N/A 08/26/2014   Procedure: CARDIOVERSION;  Surgeon: Sanda Klein, MD;  Location: [place];  Service: Cardiovascular;  Laterality: N/A;  . CARDIOVERSION N/A 10/10/2016   Procedure: CARDIOVERSION;  Surgeon: Larey Dresser, MD;  Location: [facility] ENDOSCOPY;  Service: Cardiovascular;  Laterality: N/A;  . CARDIOVERSION N/A 12/04/2016   Procedure: CARDIOVERSION;  Surgeon: Larey Dresser, MD;  Location: [facility] ENDOSCOPY;  Service: Cardiovascular;  Laterality: N/A;  . CARDIOVERSION N/A 06/09/2018   Procedure: CARDIOVERSION;  Surgeon: Dorothy Spark, MD;  Location: [facility] ENDOSCOPY;  Service: Cardiovascular;  Laterality: N/A;  . CARDIOVERSION N/A 08/17/2019   Procedure: CARDIOVERSION;  Surgeon: [name], MD;  Location: [facility] ENDOSCOPY;  Service: Cardiovascular;  Laterality: N/A;  . CARDIOVERSION N/A 11/18/2019   Procedure: CARDIOVERSION;  Surgeon: [name], MD;  Location: [place];  Service: Cardiovascular;  Laterality: N/A;  . SKIN CANCER EXCISION    . TEE WITHOUT CARDIOVERSION  04/26/2011   Procedure: TRANSESOPHAGEAL ECHOCARDIOGRAM (TEE);  Surgeon: Larey Dresser, MD;  Location: [facility] ENDOSCOPY;  Service: Cardiovascular;  Laterality: N/A;  to be done at 1330  . TEE WITHOUT CARDIOVERSION N/A 04/28/2012   Procedure: TRANSESOPHAGEAL ECHOCARDIOGRAM (TEE);  Surgeon: Thayer Headings, MD;  Location: [place];  Service: Cardiovascular;  Laterality: N/A;  . WISDOM TOOTH EXTRACTION      ROS- all systems are reviewed and negatives except as per HPI above  Current Outpatient Medications  Medication Sig Dispense Refill  . atorvastatin (LIPITOR) 10 MG tablet TAKE 1 TABLET BY MOUTH EVERY DAY (Patient  taking differently: Take 10 mg by mouth every evening.) 90 tablet 1  . calcium carbonate (TUMS - DOSED IN MG ELEMENTAL CALCIUM) 500 MG chewable tablet Chew 1 tablet by mouth daily as needed for indigestion or heartburn.    . disopyramide (NORPACE)  100 MG capsule Take 1 capsule (100 mg total) by mouth in the morning, at noon, and at bedtime. 270 capsule 3  . ELIQUIS 5 MG TABS tablet TAKE 1 TABLET BY MOUTH TWICE A DAY 180 tablet 1  . melatonin 5 MG TABS Take 5 mg by mouth at bedtime.    . metoprolol tartrate (LOPRESSOR) 25 MG tablet TAKE 1 TABLET BY MOUTH TWICE A DAY 180 tablet 2  . Multiple Vitamin (MULTIVITAMIN WITH MINERALS) TABS tablet Take 1 tablet by mouth 3 (three) times a week.    . sildenafil (VIAGRA) 25 MG tablet Take 25 mg by mouth as needed for erectile dysfunction.      No current facility-administered medications for this visit.    Physical Exam: Vitals:   03/02/20 0939  BP: 110/70  Pulse: 75  SpO2: 97%  Weight: 154 lb (69.9 kg)  Height: 5\' 6"  (1.676 m)    GEN- The patient is well appearing, alert and oriented x 3 today.   Head- normocephalic, atraumatic Eyes-  Sclera clear, conjunctiva pink Ears- hearing intact Oropharynx- clear Lungs-  normal work of breathing Heart- irregular rate and rhythm  GI- soft, NT, ND, + BS Extremities- no clubbing, cyanosis, or edema  Wt Readings from Last 3 Encounters:  03/02/20 154 lb (69.9 kg)  12/02/19 157 lb (71.2 kg)  11/18/19 150 lb (68 kg)    EKG tracing ordered today is personally reviewed and shows  Persistent afib, V rate 80s  Assessment and Plan:  1. Persistent afib/ atrial flutter S/p ablation 03/2010 and 11/2016 Now back in afib He has failed medical therapy with norpace  Chads2vasc score is 3.  he is anticoagulated with eliquis . Therapeutic strategies for afib including medicine (tikosyn, amiodarone) and ablation were discussed in detail with the patient today. Risk, benefits, and alternatives to EP study and radiofrequency ablation for afib were also discussed in detail today. These risks include but are not limited to stroke, bleeding, vascular damage, tamponade, perforation, damage to the esophagus, lungs, and other structures, pulmonary vein stenosis,  worsening renal function, and death. The patient understands these risk and wishes to think about options further. We will proceed with cardioversion.  We will have him follow-up in the AF clinic 1 week later for further considerations of AAD change vs ablation.  2. Hypertrophic nonobstructive CM Stable No change required today   Risks, benefits and potential toxicities for medications prescribed and/or refilled reviewed with patient today.   [name] MD, [facility] 03/02/2020 10:14 AM

## 2020-03-02 NOTE — Patient Instructions (Addendum)
Medication Instructions:  Your physician recommends that you continue on your current medications as directed. Please refer to the Current Medication list given to you today.  Labwork: None ordered.  Testing/Procedures: None ordered.  Follow-Up: Your physician wants you to follow-up in: you will follow up one week after your cardioversion in the afib clinic. They will contact you to schedule.    Any Other Special Instructions Will Be Listed Below (If Applicable).  If you need a refill on your cardiac medications before your next appointment, please call your pharmacy.

## 2020-03-03 ENCOUNTER — Telehealth (HOSPITAL_COMMUNITY): Payer: Self-pay | Admitting: Nurse Practitioner

## 2020-03-03 NOTE — Telephone Encounter (Signed)
Called and left message for patient to call back.  Needs 1 wk f/u at [facility] cardioversion on 03/09/20 per Saddie Benders, RN.

## 2020-03-03 NOTE — Telephone Encounter (Signed)
Patient returned my call and is aware of appt 03/16/20 with Roderic Palau, NP.

## 2020-03-07 ENCOUNTER — Other Ambulatory Visit (HOSPITAL_COMMUNITY)
Admission: RE | Admit: 2020-03-07 | Discharge: 2020-03-07 | Disposition: A | Discharge status: Home / Self Care | Payer: Medicare PPO | Source: Ambulatory Visit | Attending: Cardiology | Admitting: Cardiology

## 2020-03-07 DIAGNOSIS — Z01812 Encounter for preprocedural laboratory examination: Secondary | ICD-10-CM | POA: Diagnosis present

## 2020-03-07 DIAGNOSIS — Z20822 Contact with and (suspected) exposure to covid-19: Secondary | ICD-10-CM | POA: Insufficient documentation

## 2020-03-07 LAB — SARS CORONAVIRUS 2 (TAT 6-24 HRS): SARS Coronavirus 2: NEGATIVE

## 2020-03-09 ENCOUNTER — Ambulatory Visit (HOSPITAL_COMMUNITY): Payer: Medicare PPO | Admitting: Anesthesiology

## 2020-03-09 ENCOUNTER — Encounter (HOSPITAL_COMMUNITY)
Admission: RE | Disposition: A | Discharge status: Home / Self Care | Payer: Self-pay | Source: Home / Self Care | Attending: Cardiology

## 2020-03-09 ENCOUNTER — Ambulatory Visit (HOSPITAL_COMMUNITY)
Admission: RE | Admit: 2020-03-09 | Discharge: 2020-03-09 | Disposition: A | Discharge status: Home / Self Care | Payer: Medicare PPO | Attending: Cardiology | Admitting: Cardiology

## 2020-03-09 ENCOUNTER — Other Ambulatory Visit: Payer: Self-pay

## 2020-03-09 DIAGNOSIS — Z7901 Long term (current) use of anticoagulants: Secondary | ICD-10-CM | POA: Insufficient documentation

## 2020-03-09 DIAGNOSIS — I422 Other hypertrophic cardiomyopathy: Secondary | ICD-10-CM | POA: Insufficient documentation

## 2020-03-09 DIAGNOSIS — Z79899 Other long term (current) drug therapy: Secondary | ICD-10-CM | POA: Insufficient documentation

## 2020-03-09 DIAGNOSIS — I4819 Other persistent atrial fibrillation: Secondary | ICD-10-CM | POA: Diagnosis present

## 2020-03-09 DIAGNOSIS — I4892 Unspecified atrial flutter: Secondary | ICD-10-CM | POA: Diagnosis not present

## 2020-03-09 HISTORY — PX: CARDIOVERSION: SHX1299

## 2020-03-09 LAB — POCT I-STAT, CHEM 8
BUN: 15 mg/dL (ref 8–23)
Calcium, Ion: 1.18 mmol/L (ref 1.15–1.40)
Chloride: 102 mmol/L (ref 98–111)
Creatinine, Ser: 0.9 mg/dL (ref 0.61–1.24)
Glucose, Bld: 87 mg/dL (ref 70–99)
HCT: 44 % (ref 39.0–52.0)
Hemoglobin: 15 g/dL (ref 13.0–17.0)
Potassium: 4.7 mmol/L (ref 3.5–5.1)
Sodium: 139 mmol/L (ref 135–145)
TCO2: 24 mmol/L (ref 22–32)

## 2020-03-09 SURGERY — CARDIOVERSION
Anesthesia: General

## 2020-03-09 MED ORDER — SODIUM CHLORIDE 0.9 % IV SOLN: INTRAVENOUS | Status: DC | PRN

## 2020-03-09 MED ORDER — PROPOFOL 10 MG/ML IV BOLUS
INTRAVENOUS | Status: DC | PRN
  Administered 2020-03-09: 80 mg via INTRAVENOUS

## 2020-03-09 MED ORDER — LIDOCAINE 2% (20 MG/ML) 5 ML SYRINGE
INTRAMUSCULAR | Status: DC | PRN
  Administered 2020-03-09: 100 mg via INTRAVENOUS

## 2020-03-09 NOTE — Discharge Instructions (Signed)

## 2020-03-09 NOTE — Transfer of Care (Signed)
Immediate Anesthesia Transfer of Care Note  Patient: Ronnie Black  Procedure(s) Performed: CARDIOVERSION (N/A )  Patient Location: Endoscopy Unit  Anesthesia Type:General  Level of Consciousness: drowsy  Airway & Oxygen Therapy: Patient Spontanous Breathing  Post-op Assessment: Report given to RN and Post -op Vital signs reviewed and stable  Post vital signs: Reviewed and stable  Last Vitals:  Vitals Value Taken Time  BP 100/67 03/09/20 1047  Temp    Pulse 60 03/09/20 1048  Resp 17 03/09/20 1048  SpO2 100 % 03/09/20 1048  Vitals shown include unvalidated device data.  Last Pain:  Vitals:   03/09/20 1005  TempSrc: Oral  PainSc: 0-No pain         Complications: No complications documented.

## 2020-03-09 NOTE — Anesthesia Postprocedure Evaluation (Signed)
Anesthesia Post Note  Patient: Ronnie Black  Procedure(s) Performed: CARDIOVERSION (N/A )     Patient location during evaluation: PACU Anesthesia Type: General Level of consciousness: sedated Pain management: pain level controlled Vital Signs Assessment: post-procedure vital signs reviewed and stable Respiratory status: spontaneous breathing and respiratory function stable Cardiovascular status: stable Postop Assessment: no apparent nausea or vomiting Anesthetic complications: no   No complications documented.  Last Vitals:  Vitals:   03/09/20 1110 03/09/20 1113  BP: 116/61 118/78  Pulse: 67 75  Resp: 18 11  Temp:    SpO2: 96% 97%    Last Pain:  Vitals:   03/09/20 1113  TempSrc:   PainSc: 0-No pain                 Mette Southgate DANIEL

## 2020-03-09 NOTE — Interval H&P Note (Signed)
History and Physical Interval Note:  03/09/2020 10:20 AM  Ronnie Black  has presented today for surgery, with the diagnosis of AFIB.  The various methods of treatment have been discussed with the patient and family. After consideration of risks, benefits and other options for treatment, the patient has consented to  Procedure(s): CARDIOVERSION (N/A) as a surgical intervention.  The patient's history has been reviewed, patient examined, no change in status, stable for surgery.  I have reviewed the patient's chart and labs.  Questions were answered to the patient's satisfaction.     [employer]

## 2020-03-09 NOTE — Anesthesia Preprocedure Evaluation (Addendum)
Anesthesia Evaluation  Patient identified by MRN, date of birth, ID band Patient awake    Reviewed: Allergy & Precautions, NPO status , Patient's Chart, lab work & pertinent test results  History of Anesthesia Complications Negative for: history of anesthetic complications  Airway Mallampati: II  TM Distance: >3 FB Neck ROM: Full    Dental no notable dental hx. (+) Dental Advisory Given   Pulmonary former smoker,    Pulmonary exam normal        Cardiovascular hypertension, Pt. on home beta blockers +CHF  + dysrhythmias Atrial Fibrillation  Rhythm:Irregular Rate:Normal  IMPRESSIONS   1. The apical views are concerning for apical LV hypertrophy. . Left ventricular ejection fraction, by estimation, is 60 to 65%. The left ventricle has normal function. The left ventricle has no regional wall motion abnormalities. 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 Left ventricular diastolic parameters are indeterminate. 2. Right ventricular systolic function is normal. The right ventricular size is normal. 3. The mitral valve is normal in structure. Mild mitral valve regurgitation. No evidence of mitral stenosis. 4. The aortic valve is normal in structure. Aortic valve regurgitation is not visualized. No aortic stenosis is present.   Neuro/Psych negative neurological ROS  negative psych ROS   GI/Hepatic negative GI ROS, (+)     substance abuse  ,   Endo/Other  negative endocrine ROS  Renal/GU negative Renal ROS     Musculoskeletal  (+) Arthritis ,   Abdominal   Peds  Hematology HLD   Anesthesia Other Findings A-FIB  Reproductive/Obstetrics                            Anesthesia Physical  Anesthesia Plan  ASA: III  Anesthesia Plan: General   Post-op Pain Management:    Induction: Intravenous  PONV Risk Score and Plan: 2 and Propofol infusion and Treatment may vary due to  age or medical condition  Airway Management Planned: Mask  Additional Equipment:   Intra-op Plan:   Post-operative Plan:   Informed Consent: I have reviewed the patients History and Physical, chart, labs and discussed the procedure including the risks, benefits and alternatives for the proposed anesthesia with the patient or authorized representative who has indicated his/her understanding and acceptance.       Plan Discussed with: CRNA and Anesthesiologist  Anesthesia Plan Comments:        Anesthesia Quick Evaluation

## 2020-03-09 NOTE — CV Procedure (Signed)
    Electrical Cardioversion Procedure Note Ronnie Black [id number] 12/19/1945  Procedure: Electrical Cardioversion Indications:  Atrial Fibrillation  Time Out: Verified patient identification, verified procedure,medications/allergies/relevent history reviewed, required imaging and test results available.  Performed  Procedure Details  The patient was NPO after midnight. Anesthesia was administered at the beside  by Dr.Singer with propofol.  Cardioversion was performed with synchronized biphasic defibrillation via AP pads with 200 joules.  1 attempt(s) were performed.  The patient converted to normal sinus rhythm. The patient tolerated the procedure well   IMPRESSION:  Successful cardioversion of atrial fibrillation    Ronnie Black 03/09/2020, 10:48 AM

## 2020-03-12 ENCOUNTER — Encounter (HOSPITAL_COMMUNITY): Payer: Self-pay | Admitting: Cardiology

## 2020-03-13 ENCOUNTER — Encounter: Payer: Self-pay | Admitting: Family Medicine

## 2020-03-13 ENCOUNTER — Ambulatory Visit: Payer: Medicare PPO | Admitting: Family Medicine

## 2020-03-13 ENCOUNTER — Ambulatory Visit (INDEPENDENT_AMBULATORY_CARE_PROVIDER_SITE_OTHER): Payer: Medicare PPO

## 2020-03-13 ENCOUNTER — Other Ambulatory Visit: Payer: Self-pay

## 2020-03-13 VITALS — BP 124/82 | HR 85 | Temp 97.7°F | Wt 155.4 lb

## 2020-03-13 DIAGNOSIS — M25562 Pain in left knee: Secondary | ICD-10-CM

## 2020-03-13 DIAGNOSIS — Z7901 Long term (current) use of anticoagulants: Secondary | ICD-10-CM

## 2020-03-13 DIAGNOSIS — M79662 Pain in left lower leg: Secondary | ICD-10-CM | POA: Diagnosis not present

## 2020-03-13 DIAGNOSIS — D6869 Other thrombophilia: Secondary | ICD-10-CM

## 2020-03-13 DIAGNOSIS — R600 Localized edema: Secondary | ICD-10-CM

## 2020-03-13 NOTE — Progress Notes (Signed)
Subjective  CC:  Chief Complaint  Patient presents with  . Knee Pain    Left knee, pain started the beginning of the year, has noticed left foot swelling also.     HPI: Ronnie Black is a 75 y.o. male who presents to the office today to address the problems listed above in the chief complaint.  75 year old male describes 3 separate problems: He reports that at the beginning of January, approximately 6 to 7 weeks ago he noted medial knee pain.  This started to be bothersome while he was running.  He reports he would run about once a week.  Since then, he has stopped running and has persistent pain in that area with walking.  He exercises on the treadmill and a recumbent bike: He has no knee pain while exercising.  He is taking Advil and Tylenol with mild relief of pain.  He is also noticed some fullness and tightness in the back of the knee.  It is mostly notable when he tries to flex fully.  He also notices lower extremity swelling, calf tenderness and ankle swelling.  He reports a fullness in the back of the knee and the swelling of the lower extremity started about 3 weeks ago.  He denies claudication.  No ankle injury or pain.  He is also noticing discoloration of the leg.  He has had no history of knee problems.   Assessment  1. Medial knee pain, left   2. Pain of left calf   3. Leg edema, left   4. Current use of long term anticoagulation   5. Secondary hypercoagulable state ([place])      Plan   Medial knee pain: Joint line tenderness: Possible tibial plateau stress fracture or contusion or bursitis.  No effusion of the knee.  Check knee x-rays.  May need MRI.  Pain is mild.  Over-the-counter medicines if needed.  Agree, no walking or running for exercise at this point.  Swelling of the left lower extremity with tender calf and possible cord: Check lower extremity Dopplers.  He is on an anticoagulant.  Could also have a Baker's cyst that has ruptured.  Further recommendations  pending results.  Follow up: As needed Visit date not found  Orders Placed This Encounter  Procedures  . DG Knee Complete 4 Views Left  . VAS [name] LOWER EXTREMITY VENOUS (DVT)   No orders of the defined types were placed in this encounter.     I reviewed the patients updated PMH, FH, and SocHx.    Patient Active Problem List   Diagnosis Date Noted  . Essential hypertension 12/01/2019    Priority: High  . Current use of long term anticoagulation 11/06/2018    Priority: High  . Atrial fibrillation status post cardioversion [facility]) 02/24/2017    Priority: High  . Family history of colon cancer 10/01/2012    Priority: High  . Mixed hyperlipidemia 08/13/2008    Priority: High  . HOCM (hypertrophic obstructive cardiomyopathy) ([place]) 08/13/2008    Priority: High  . Persistent atrial fibrillation ([place]) 11/21/2016    Priority: Medium  . Benign prostatic hyperplasia without lower urinary tract symptoms 11/07/2016    Priority: Medium  . Herniated lumbar disc without myelopathy 07/08/2012    Priority: Medium  . ED (erectile dysfunction) 01/07/2012    Priority: Low  . Actinic keratosis 01/07/2012    Priority: Low  . Seborrheic keratosis 01/07/2012    Priority: Low  . CAROTID BRUIT 08/24/2008  Priority: Low  . Secondary hypercoagulable state ([place]) 08/24/2019   Current Meds  Medication Sig  . atorvastatin (LIPITOR) 10 MG tablet TAKE 1 TABLET BY MOUTH EVERY DAY (Patient taking differently: Take 10 mg by mouth every evening.)  . calcium carbonate (TUMS - DOSED IN MG ELEMENTAL CALCIUM) 500 MG chewable tablet Chew 1 tablet by mouth daily as needed for indigestion or heartburn.  . disopyramide (NORPACE) 100 MG capsule Take 1 capsule (100 mg total) by mouth in the morning, at noon, and at bedtime.  Marland Kitchen ELIQUIS 5 MG TABS tablet TAKE 1 TABLET BY MOUTH TWICE A DAY (Patient taking differently: Take 5 mg by mouth 2 (two) times daily.)  . ibuprofen (ADVIL) 200 MG tablet Take 400 mg by mouth every  6 (six) hours as needed for moderate pain.  . melatonin 5 MG TABS Take 5 mg by mouth at bedtime.  . metoprolol tartrate (LOPRESSOR) 25 MG tablet TAKE 1 TABLET BY MOUTH TWICE A DAY (Patient taking differently: Take 25 mg by mouth 2 (two) times daily.)  . Multiple Vitamin (MULTIVITAMIN WITH MINERALS) TABS tablet Take 1 tablet by mouth 3 (three) times a week.  . sildenafil (VIAGRA) 25 MG tablet Take 25 mg by mouth as needed for erectile dysfunction.   . sodium chloride (OCEAN) 0.65 % SOLN nasal spray Place 1 spray into both nostrils as needed (dryness).    Allergies: Patient has No Known Allergies. Family History: Patient family history includes Colon cancer in his father and mother; Coronary artery disease in an other family member; Lung cancer in his mother. Social History:  Patient  reports that he quit smoking about 44 years ago. His smoking use included cigarettes. He has a 15.00 pack-year smoking history. He has never used smokeless tobacco. He reports current alcohol use of about 6.0 standard drinks of alcohol per week. He reports that he does not use drugs.  Review of Systems: Constitutional: Negative for fever malaise or anorexia Cardiovascular: negative for chest pain Respiratory: negative for SOB or persistent cough Gastrointestinal: negative for abdominal pain  Objective  Vitals: BP 124/82   Pulse 85   Temp 97.7 F (36.5 C) (Temporal)   Wt 155 lb 6.4 oz (70.5 kg)   SpO2 98%   BMI 25.08 kg/m  General: no acute distress , A&Ox3  Normal gait Left knee: Normal appearing, no erythema or warmth, tender over medial joint line, no effusion, no other tenderness, fullness in the back of the knee with mild tenderness.  Negative Lachman's.  Able to squat without pain. Left calf: Possible cord, mild tenderness, +2 edema to mid calf Distal pulses +2 bilaterally    Commons side effects, risks, benefits, and alternatives for medications and treatment plan prescribed today were  discussed, and the patient expressed understanding of the given instructions. Patient is instructed to call or message via MyChart if he/she has any questions or concerns regarding our treatment plan. No barriers to understanding were identified. We discussed Red Flag symptoms and signs in detail. Patient expressed understanding regarding what to do in case of urgent or emergency type symptoms.   Medication list was reconciled, printed and provided to the patient in AVS. Patient instructions and summary information was reviewed with the patient as documented in the AVS. This note was prepared with assistance of Dragon voice recognition software. Occasional wrong-word or sound-a-like substitutions may have occurred due to the inherent limitations of voice recognition software  This visit occurred during the SARS-CoV-2 public health emergency.  Safety protocols  were in place, including screening questions prior to the visit, additional usage of staff PPE, and extensive cleaning of exam room while observing appropriate contact time as indicated for disinfecting solutions.

## 2020-03-13 NOTE — Patient Instructions (Signed)
Please follow up if symptoms do not improve or as needed.   I will be in touch with your results.  IF you develop any problems breathing, go to the ER for evaluation.  You may use ice and advil if needed for the knee pain.

## 2020-03-14 ENCOUNTER — Telehealth: Payer: Self-pay

## 2020-03-14 ENCOUNTER — Ambulatory Visit (HOSPITAL_COMMUNITY)
Admission: RE | Admit: 2020-03-14 | Discharge: 2020-03-14 | Disposition: A | Discharge status: Home / Self Care | Payer: Medicare PPO | Source: Ambulatory Visit | Attending: Family Medicine | Admitting: Family Medicine

## 2020-03-14 DIAGNOSIS — R600 Localized edema: Secondary | ICD-10-CM | POA: Diagnosis present

## 2020-03-14 DIAGNOSIS — M79662 Pain in left lower leg: Secondary | ICD-10-CM | POA: Diagnosis not present

## 2020-03-14 NOTE — Telephone Encounter (Signed)
[facility] called and gave verbal results.

## 2020-03-16 ENCOUNTER — Encounter (HOSPITAL_COMMUNITY): Payer: Self-pay | Admitting: Nurse Practitioner

## 2020-03-16 ENCOUNTER — Other Ambulatory Visit: Payer: Self-pay

## 2020-03-16 ENCOUNTER — Ambulatory Visit (HOSPITAL_COMMUNITY)
Admission: RE | Admit: 2020-03-16 | Discharge: 2020-03-16 | Disposition: A | Discharge status: Home / Self Care | Payer: Medicare PPO | Source: Ambulatory Visit | Attending: Nurse Practitioner | Admitting: Nurse Practitioner

## 2020-03-16 VITALS — BP 132/80 | HR 78 | Ht 66.0 in | Wt 157.2 lb

## 2020-03-16 DIAGNOSIS — I4891 Unspecified atrial fibrillation: Secondary | ICD-10-CM | POA: Insufficient documentation

## 2020-03-16 DIAGNOSIS — Z87891 Personal history of nicotine dependence: Secondary | ICD-10-CM | POA: Insufficient documentation

## 2020-03-16 DIAGNOSIS — I4892 Unspecified atrial flutter: Secondary | ICD-10-CM | POA: Diagnosis present

## 2020-03-16 DIAGNOSIS — Z7901 Long term (current) use of anticoagulants: Secondary | ICD-10-CM | POA: Insufficient documentation

## 2020-03-16 DIAGNOSIS — D6869 Other thrombophilia: Secondary | ICD-10-CM | POA: Diagnosis not present

## 2020-03-16 DIAGNOSIS — Z79899 Other long term (current) drug therapy: Secondary | ICD-10-CM | POA: Insufficient documentation

## 2020-03-16 DIAGNOSIS — I4819 Other persistent atrial fibrillation: Secondary | ICD-10-CM

## 2020-03-16 NOTE — Telephone Encounter (Signed)
patient returned call regarding [name] results

## 2020-03-16 NOTE — Telephone Encounter (Signed)
Patient given results

## 2020-03-16 NOTE — Progress Notes (Signed)
Patient ID: Ronnie Black, male   DOB: [birth year], 75 y.o.   MRN: [id number]     Primary Care Physician: [name], MD Referring Physician: Dr. Ron Parker EP: Dr. Hilliard Clark is a 75 y.o. male with a h/o afib, frequent cardioversions in  the past. He has had afib/flutter ablation in 2012  and afib ablation fall of 2018. He was on norpace at one time, changed to amiodarone which was stopped shortly after last ablation, then back to Norpace.   He has done well the majority of this year but needed CV in July which was successful. He saw Dr. [name] recently had was in afib. He was set up for cardioversion which was successful. He continues in SR. . Dr. [name] gave him options of another ablation vrs medicine change. He wanted to further consider and discuss today.   Today, he denies symptoms of  chest pain, shortness of breath, orthopnea, PND, lower extremity edema, dizziness, presyncope, syncope, or neurologic sequela.  .The patient is tolerating medications without difficulties and is otherwise without complaint today.   Past Medical History:  Diagnosis Date  . Current use of long term anticoagulation 11/06/2018  . DDD (degenerative disc disease)   . HLD (hyperlipidemia)   . Hypertension   . 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 Persistent atrial fibrillation ([place])    s/p PVI 3/12   Past Surgical History:  Procedure Laterality Date  . APPENDECTOMY    . atrial fibrillation ablation  03/27/10   PVI with CTI ablation by JA  . ATRIAL FIBRILLATION ABLATION N/A 11/21/2016   Procedure: ATRIAL FIBRILLATION ABLATION;  Surgeon: [name], MD;  Location: [place] [facility];  Service: Cardiovascular;  Laterality: N/A;  . CARDIOVERSION    . CARDIOVERSION  04/26/2011   Procedure: CARDIOVERSION;  Surgeon: Larey Dresser, MD;  Location: [place];  Service: Cardiovascular;  Laterality: N/A;  . CARDIOVERSION N/A 04/28/2012   Procedure: CARDIOVERSION;   Surgeon: Thayer Headings, MD;  Location: [place];  Service: Cardiovascular;  Laterality: N/A;  . CARDIOVERSION N/A 07/28/2012   Procedure: CARDIOVERSION;  Surgeon: Lelon Perla, MD;  Location: [facility] ENDOSCOPY;  Service: Cardiovascular;  Laterality: N/A;  . CARDIOVERSION N/A 08/14/2012   Procedure: CARDIOVERSION;  Surgeon: Josue Hector, MD;  Location: [place];  Service: Cardiovascular;  Laterality: N/A;  . CARDIOVERSION N/A 08/11/2014   Procedure: CARDIOVERSION;  Surgeon: Fay Records, MD;  Location: [place];  Service: Cardiovascular;  Laterality: N/A;  . CARDIOVERSION N/A 08/26/2014   Procedure: CARDIOVERSION;  Surgeon: Sanda Klein, MD;  Location: [place];  Service: Cardiovascular;  Laterality: N/A;  . CARDIOVERSION N/A 10/10/2016   Procedure: CARDIOVERSION;  Surgeon: Larey Dresser, MD;  Location: [facility] ENDOSCOPY;  Service: Cardiovascular;  Laterality: N/A;  . CARDIOVERSION N/A 12/04/2016   Procedure: CARDIOVERSION;  Surgeon: Larey Dresser, MD;  Location: [facility] ENDOSCOPY;  Service: Cardiovascular;  Laterality: N/A;  . CARDIOVERSION N/A 06/09/2018   Procedure: CARDIOVERSION;  Surgeon: Dorothy Spark, MD;  Location: [facility] ENDOSCOPY;  Service: Cardiovascular;  Laterality: N/A;  . CARDIOVERSION N/A 08/17/2019   Procedure: CARDIOVERSION;  Surgeon: [name], MD;  Location: [facility] ENDOSCOPY;  Service: Cardiovascular;  Laterality: N/A;  . CARDIOVERSION N/A 11/18/2019   Procedure: CARDIOVERSION;  Surgeon: [name], MD;  Location: [place];  Service: Cardiovascular;  Laterality: N/A;  . CARDIOVERSION N/A 03/09/2020   Procedure: CARDIOVERSION;  Surgeon: [name], MD;  Location: [facility]  ENDOSCOPY;  Service: Cardiovascular;  Laterality: N/A;  . SKIN CANCER EXCISION    . TEE WITHOUT CARDIOVERSION  04/26/2011   Procedure: TRANSESOPHAGEAL ECHOCARDIOGRAM (TEE);  Surgeon: Larey Dresser, MD;  Location: [facility] ENDOSCOPY;  Service: Cardiovascular;  Laterality: N/A;  to be done at  1330  . TEE WITHOUT CARDIOVERSION N/A 04/28/2012   Procedure: TRANSESOPHAGEAL ECHOCARDIOGRAM (TEE);  Surgeon: Thayer Headings, MD;  Location: [place];  Service: Cardiovascular;  Laterality: N/A;  . WISDOM TOOTH EXTRACTION      Current Outpatient Medications  Medication Sig Dispense Refill  . atorvastatin (LIPITOR) 10 MG tablet TAKE 1 TABLET BY MOUTH EVERY DAY (Patient taking differently: Take 10 mg by mouth every evening.) 90 tablet 1  . calcium carbonate (TUMS - DOSED IN MG ELEMENTAL CALCIUM) 500 MG chewable tablet Chew 1 tablet by mouth daily as needed for indigestion or heartburn.    . disopyramide (NORPACE) 100 MG capsule Take 1 capsule (100 mg total) by mouth in the morning, at noon, and at bedtime. 270 capsule 3  . ELIQUIS 5 MG TABS tablet TAKE 1 TABLET BY MOUTH TWICE A DAY (Patient taking differently: Take 5 mg by mouth 2 (two) times daily.) 180 tablet 1  . ibuprofen (ADVIL) 200 MG tablet Take 400 mg by mouth every 6 (six) hours as needed for moderate pain.    . melatonin 5 MG TABS Take 5 mg by mouth at bedtime.    . metoprolol tartrate (LOPRESSOR) 25 MG tablet TAKE 1 TABLET BY MOUTH TWICE A DAY (Patient taking differently: Take 25 mg by mouth 2 (two) times daily.) 180 tablet 2  . Multiple Vitamin (MULTIVITAMIN WITH MINERALS) TABS tablet Take 1 tablet by mouth 3 (three) times a week.    . sildenafil (VIAGRA) 25 MG tablet Take 25 mg by mouth as needed for erectile dysfunction.     . sodium chloride (OCEAN) 0.65 % SOLN nasal spray Place 1 spray into both nostrils as needed (dryness).     No current facility-administered medications for this encounter.    No Known Allergies  Social History   Socioeconomic History  . Marital status: Married    Spouse name: Not on file  . Number of children: 2  . Years of education: Not on file  . Highest education level: Not on file  Occupational History  . Occupation: professor    Employer: [employer]  Tobacco Use  . Smoking status:  Former Smoker    Packs/day: 1.00    Years: 15.00    Pack years: 15.00    Types: Cigarettes    Quit date: 11/28/1975    Years since quitting: 44.3  . Smokeless tobacco: Never Used  Vaping Use  . Vaping Use: Never used  Substance and Sexual Activity  . Alcohol use: Yes    Alcohol/week: 6.0 standard drinks    Types: 3 Glasses of wine, 3 Cans of beer per week    Comment: rare wine  . Drug use: No  . Sexual activity: Yes  Other Topics Concern  . Not on file  Social History Narrative    a professor of Therapist, occupational at [employer] in [place] @ [place] Strain: Not on [employer] Insecurity: Not on file  Transportation Needs: Not on file  Physical Activity: Not on file  Stress: Not on file  Social Connections: Not on file  Intimate Partner Violence: Not on file  Family History  Problem Relation Age of Onset  . Coronary artery disease Other   . Colon cancer Mother   . Lung cancer Mother   . Colon cancer Father   . Early death Neg Hx   . Hearing loss Neg Hx   . Heart disease Neg Hx   . Hyperlipidemia Neg Hx   . Hypertension Neg Hx   . Kidney disease Neg Hx   . Stroke Neg Hx     ROS- All systems are reviewed and negative except as per the HPI above  Physical Exam: There were no vitals filed for this visit.  GEN- The patient is well appearing, alert and oriented x 3 today.   Head- normocephalic, atraumatic Eyes-  Sclera clear, conjunctiva pink Ears- hearing intact Oropharynx- clear Neck- supple, no JVP Lymph- no cervical lymphadenopathy Lungs- Clear to ausculation bilaterally, normal work of breathing Heart- regular rate and rhythm, no murmurs, rubs or gallops, PMI not laterally displaced GI- soft, NT, ND, + BS Extremities- no clubbing, cyanosis, or edema MS- no significant deformity or atrophy Skin- no rash or lesion Psych- euthymic mood, full affect Neuro- strength and  sensation are intact  EKG-  SR with first degree AV block at 78 bpm, RBBB, pr int 222 ms, qrs int 98 ms, qtc 456 ms    Assessment and Plan:  1. Persistent flutter  S/p ablation 03/2010 and 11/21/16 Afib that required DCCV x 3 over the last 12 months  Last cardioversion successful and remains in SR Dr. [name]  suggested repeat cardioversion vrs amiodarone vrs tikoyn  He is leaning toward Tikosyn but still wants to give it more thought  Continue norpace as prescribed  Continue Eliquis 5 mg bid with chadsvasc score of 3  Continue metoprolol tartrate  25 mg  bid      Pt will let us know if he is ready to pursue Tikosyn otherwise will see back in 6 months   Butch Penny C. Imre Vecchione, [facility] [address] [place], [place] [address] [phone]

## 2020-04-07 ENCOUNTER — Other Ambulatory Visit: Payer: Self-pay

## 2020-04-07 ENCOUNTER — Encounter: Payer: Self-pay | Admitting: Internal Medicine

## 2020-04-07 ENCOUNTER — Telehealth: Payer: Self-pay | Admitting: Student-PharmD

## 2020-04-07 ENCOUNTER — Ambulatory Visit: Payer: Medicare PPO | Admitting: Internal Medicine

## 2020-04-07 VITALS — BP 136/80 | HR 83 | Ht 66.0 in | Wt 156.4 lb

## 2020-04-07 DIAGNOSIS — I484 Atypical atrial flutter: Secondary | ICD-10-CM

## 2020-04-07 DIAGNOSIS — I4819 Other persistent atrial fibrillation: Secondary | ICD-10-CM

## 2020-04-07 DIAGNOSIS — I422 Other hypertrophic cardiomyopathy: Secondary | ICD-10-CM

## 2020-04-07 NOTE — Progress Notes (Signed)
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  Since his recent cardioversion, the patient reports doing very well.  Today, he denies symptoms of palpitations, chest pain, shortness of breath,  lower extremity edema, dizziness, presyncope, or syncope.  The patient is otherwise without complaint today.   Past Medical History:  Diagnosis Date  . Current use of long term anticoagulation 11/06/2018  . DDD (degenerative disc disease)   . HLD (hyperlipidemia)   . Hypertension   . 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 Persistent atrial fibrillation ([place])    s/p PVI 3/12   Past Surgical History:  Procedure Laterality Date  . APPENDECTOMY    . atrial fibrillation ablation  03/27/10   PVI with CTI ablation by JA  . ATRIAL FIBRILLATION ABLATION N/A 11/21/2016   Procedure: ATRIAL FIBRILLATION ABLATION;  Surgeon: [name], MD;  Location: [place] [facility];  Service: Cardiovascular;  Laterality: N/A;  . CARDIOVERSION    . CARDIOVERSION  04/26/2011   Procedure: CARDIOVERSION;  Surgeon: Larey Dresser, MD;  Location: [place];  Service: Cardiovascular;  Laterality: N/A;  . CARDIOVERSION N/A 04/28/2012   Procedure: CARDIOVERSION;  Surgeon: Thayer Headings, MD;  Location: [place];  Service: Cardiovascular;  Laterality: N/A;  . CARDIOVERSION N/A 07/28/2012   Procedure: CARDIOVERSION;  Surgeon: Lelon Perla, MD;  Location: [facility] ENDOSCOPY;  Service: Cardiovascular;  Laterality: N/A;  . CARDIOVERSION N/A 08/14/2012   Procedure: CARDIOVERSION;  Surgeon: Josue Hector, MD;  Location: [place];  Service: Cardiovascular;  Laterality: N/A;  . CARDIOVERSION N/A 08/11/2014   Procedure: CARDIOVERSION;  Surgeon: Fay Records, MD;  Location: [place];  Service: Cardiovascular;  Laterality: N/A;  . CARDIOVERSION N/A 08/26/2014   Procedure: CARDIOVERSION;  Surgeon: Sanda Klein, MD;  Location: [place];  Service: Cardiovascular;  Laterality: N/A;  . CARDIOVERSION N/A 10/10/2016   Procedure: CARDIOVERSION;  Surgeon: Larey Dresser, MD;  Location: [facility] ENDOSCOPY;  Service: Cardiovascular;  Laterality: N/A;  . CARDIOVERSION N/A 12/04/2016   Procedure: CARDIOVERSION;  Surgeon: Larey Dresser, MD;  Location: [place];  Service: Cardiovascular;  Laterality: N/A;  . CARDIOVERSION N/A 06/09/2018   Procedure: CARDIOVERSION;  Surgeon: Dorothy Spark, MD;  Location: [facility] ENDOSCOPY;  Service: Cardiovascular;  Laterality: N/A;  . CARDIOVERSION N/A 08/17/2019   Procedure: CARDIOVERSION;  Surgeon: [name], MD;  Location: [facility] ENDOSCOPY;  Service: Cardiovascular;  Laterality: N/A;  . CARDIOVERSION N/A 11/18/2019   Procedure: CARDIOVERSION;  Surgeon: [name], MD;  Location: [place];  Service: Cardiovascular;  Laterality: N/A;  . CARDIOVERSION N/A 03/09/2020   Procedure: CARDIOVERSION;  Surgeon: [name], MD;  Location: [place];  Service: Cardiovascular;  Laterality: N/A;  . SKIN CANCER EXCISION    . TEE WITHOUT CARDIOVERSION  04/26/2011   Procedure: TRANSESOPHAGEAL ECHOCARDIOGRAM (TEE);  Surgeon: Larey Dresser, MD;  Location: [facility] ENDOSCOPY;  Service: Cardiovascular;  Laterality: N/A;  to be done at 1330  . TEE WITHOUT CARDIOVERSION N/A 04/28/2012   Procedure: TRANSESOPHAGEAL ECHOCARDIOGRAM (TEE);  Surgeon: Thayer Headings, MD;  Location: [place];  Service: Cardiovascular;  Laterality: N/A;  . WISDOM TOOTH EXTRACTION      ROS- all systems are reviewed and negatives except as per HPI above  Current Outpatient Medications  Medication Sig Dispense Refill  . atorvastatin (LIPITOR) 10 MG tablet TAKE 1 TABLET BY MOUTH  EVERY DAY 90 tablet 1  . calcium carbonate (TUMS - DOSED IN MG ELEMENTAL CALCIUM) 500 MG chewable tablet Chew 1 tablet by mouth daily as needed for indigestion or heartburn.    . disopyramide (NORPACE) 100 MG capsule  Take 1 capsule (100 mg total) by mouth in the morning, at noon, and at bedtime. 270 capsule 3  . ELIQUIS 5 MG TABS tablet TAKE 1 TABLET BY MOUTH TWICE A DAY 180 tablet 1  . ibuprofen (ADVIL) 200 MG tablet Take 400 mg by mouth every 6 (six) hours as needed for moderate pain.    . melatonin 5 MG TABS Take 5 mg by mouth at bedtime.    . metoprolol tartrate (LOPRESSOR) 25 MG tablet TAKE 1 TABLET BY MOUTH TWICE A DAY 180 tablet 2  . Multiple Vitamin (MULTIVITAMIN WITH MINERALS) TABS tablet Take 1 tablet by mouth 3 (three) times a week.    . sildenafil (VIAGRA) 25 MG tablet Take 25 mg by mouth as needed for erectile dysfunction.     . sodium chloride (OCEAN) 0.65 % SOLN nasal spray Place 1 spray into both nostrils as needed (dryness).     No current facility-administered medications for this visit.    Physical Exam: Vitals:   04/07/20 1200  BP: 136/80  Pulse: 83  SpO2: 97%  Weight: 156 lb 6.4 oz (70.9 kg)  Height: 5\' 6"  (1.676 m)    GEN- The patient is well appearing, alert and oriented x 3 today.   Head- normocephalic, atraumatic Eyes-  Sclera clear, conjunctiva pink Ears- hearing intact Oropharynx- clear Lungs- Clear to ausculation bilaterally, normal work of breathing Heart- Regular rate and rhythm, no murmurs, rubs or gallops, PMI not laterally displaced GI- soft, NT, ND, + BS Extremities- no clubbing, cyanosis, or edema  Wt Readings from Last 3 Encounters:  04/07/20 156 lb 6.4 oz (70.9 kg)  03/16/20 157 lb 3.2 oz (71.3 kg)  03/13/20 155 lb 6.4 oz (70.5 kg)    EKG tracing ordered today is personally reviewed and shows sinus 83 bpm, PR 226 msec, Qtc 453 msec, incomplete RBBB  Assessment and Plan:  1. Persistent atrial fibrillation/ atrial flutter S/p ablation 03/2010 and 11/18 The patient has symptomatic, recurrent persistent atrial fibrillation. he has failed medical therapy with norpace. Chads2vasc score is 3.  he is anticoagulated with eliquis . Therapeutic strategies  for afib including medicine (tikosyn) and ablation were discussed in detail with the patient today. He would like to proceed with tikosyn load Stop norpace 5 days prior to initiation.  2. Hypertrophic nonobstructive CM Stable No change required today   Risks, benefits and potential toxicities for medications prescribed and/or refilled reviewed with patient today.   [name] MD, [facility] 04/07/2020 12:12 PM

## 2020-04-07 NOTE — Patient Instructions (Addendum)
Medication Instructions:  Hold your Norpace 5 days prior to Tikosyn (Dofetilide) admission.  Your physician recommends that you continue on your current medications as directed. Please refer to the Current Medication list given to you today.  Labwork: None ordered.  Testing/Procedures: None ordered.  Follow-Up: Your physician wants you to follow-up in: [facility] will contact you for Tikosyn loading/admission.   Any Other Special Instructions Will Be Listed Below (If Applicable).  If you need a refill on your cardiac medications before your next appointment, please call your pharmacy.     Dofetilide capsules What is this medicine? DOFETILIDE (doe FET il ide) is an antiarrhythmic drug. It helps make your heart beat regularly. This medicine also helps to slow rapid heartbeats. This medicine may be used for other purposes; ask your health care provider or pharmacist if you have questions. COMMON BRAND NAME(S): Tikosyn What should I tell my health care provider before I take this medicine? They need to know if you have any of these conditions:  heart disease  history of irregular heartbeat  history of low levels of potassium or magnesium in the blood  kidney disease  liver disease  an unusual or allergic reaction to dofetilide, other medicines, foods, dyes, or preservatives  pregnant or trying to get pregnant  breast-feeding How should I use this medicine? Take this medicine by mouth with a glass of water. Follow the directions on the prescription label. Do not take with grapefruit juice. You can take it with or without food. If it upsets your stomach, take it with food. Take your medicine at regular intervals. Do not take it more often than directed. Do not stop taking except on your doctor's advice. A special MedGuide will be given to you by the pharmacist with each prescription and refill. Be sure to read this information carefully each time. Talk to your pediatrician  regarding the use of this medicine in children. Special care may be needed. Overdosage: If you think you have taken too much of this medicine contact a poison control center or emergency room at once. NOTE: This medicine is only for you. Do not share this medicine with others. What if I miss a dose? If you miss a dose, skip it. Take your next dose at the normal time. Do not take extra or 2 doses at the same time to make up for the missed dose. What may interact with this medicine? Do not take this medicine with any of the following medications:  cimetidine  cisapride  dolutegravir  dronedarone  erdafitinib  hydrochlorothiazide  ketoconazole  megestrol  pimozide  prochlorperazine  thioridazine  trimethoprim  verapamil This medicine may also interact with the following medications:  amiloride  cannabinoids  certain antibiotics like erythromycin or clarithromycin  certain antiviral medicines for HIV or hepatitis  certain medicines for depression, anxiety, or psychotic disorders  digoxin  diltiazem  grapefruit juice  metformin  nefazodone  other medicines that prolong the QT interval (an abnormal heart rhythm)  quinine  triamterene  zafirlukast  ziprasidone This list may not describe all possible interactions. Give your health care provider a list of all the medicines, herbs, non-prescription drugs, or dietary supplements you use. Also tell them if you smoke, drink alcohol, or use illegal drugs. Some items may interact with your medicine. What should I watch for while using this medicine? Your condition will be monitored carefully while you are receiving this medicine. What side effects may I notice from receiving this medicine? Side effects that  you should report to your doctor or health care professional as soon as possible:  allergic reactions like skin rash, itching or hives, swelling of the face, lips, or tongue  breathing problems  chest  pain or chest tightness  dizziness  signs and symptoms of a dangerous change in heartbeat or heart rhythm like chest pain; dizziness; fast or irregular heartbeat; palpitations; feeling faint or lightheaded, falls; breathing problems  signs and symptoms of electrolyte imbalance like severe diarrhea, unusual sweating, vomiting, loss of appetite, increased thirst  swelling of the ankles, legs, or feet  tingling, numbness in the hands or feet Side effects that usually do not require medical attention (report to your doctor or health care professional if they continue or are bothersome):  diarrhea  general ill feeling or flu-like symptoms  headache  nausea  trouble sleeping  stomach pain This list may not describe all possible side effects. Call your doctor for medical advice about side effects. You may report side effects to FDA at 1-800-FDA-1088. Where should I keep my medicine? Keep out of the reach of children. Store at room temperature between 15 and 30 degrees C (59 and 86 degrees F). Throw away any unused medicine after the expiration date. NOTE: This sheet is a summary. It may not cover all possible information. If you have questions about this medicine, talk to your doctor, pharmacist, or health care provider.  2021 Elsevier/Gold Standard (2018-12-08 12:14:05)

## 2020-04-07 NOTE — Telephone Encounter (Signed)
Medication list reviewed in anticipation of upcoming Tikosyn initiation. The only medication the patient is taking that is contraindicated/QTc prolonging is Norpace. He has been instructed at visit with Dr. [name] today to stop taking Norpace 5 days prior to [facility] admission. Otherwise no medication concerns.   Patient is anticoagulated on Eliquis 5 mg BID, which is the appropriate dose. Please ensure that patient has not missed any anticoagulation doses in the 3 weeks prior to Tikosyn initiation.   Patient will need to be counseled to avoid use of Benadryl while on Tikosyn and in the 2-3 days prior to Tikosyn initiation.

## 2020-04-11 ENCOUNTER — Encounter (HOSPITAL_COMMUNITY): Payer: Self-pay

## 2020-05-18 ENCOUNTER — Other Ambulatory Visit: Payer: Self-pay | Admitting: Internal Medicine

## 2020-05-18 DIAGNOSIS — E785 Hyperlipidemia, unspecified: Secondary | ICD-10-CM

## 2020-05-18 NOTE — Telephone Encounter (Signed)
Pt last saw Dr [name] 04/07/20, last labs 12/01/19 Creat 0.95, age 75, weight 70.9kg, based on specified criteria pt is on appropriate dosage of Eliquis 5mg  BID for afib.  Will refill rx.

## 2020-05-19 ENCOUNTER — Other Ambulatory Visit (HOSPITAL_COMMUNITY)
Admission: RE | Admit: 2020-05-19 | Discharge: 2020-05-19 | Disposition: A | Discharge status: Home / Self Care | Payer: Medicare PPO | Source: Ambulatory Visit | Attending: Physician Assistant | Admitting: Physician Assistant

## 2020-05-19 DIAGNOSIS — Z01812 Encounter for preprocedural laboratory examination: Secondary | ICD-10-CM | POA: Insufficient documentation

## 2020-05-19 DIAGNOSIS — Z20822 Contact with and (suspected) exposure to covid-19: Secondary | ICD-10-CM | POA: Insufficient documentation

## 2020-05-20 LAB — SARS CORONAVIRUS 2 (TAT 6-24 HRS): SARS Coronavirus 2: NEGATIVE

## 2020-05-23 ENCOUNTER — Other Ambulatory Visit: Payer: Self-pay

## 2020-05-23 ENCOUNTER — Encounter (HOSPITAL_COMMUNITY): Payer: Self-pay | Admitting: Physician Assistant

## 2020-05-23 ENCOUNTER — Encounter (HOSPITAL_COMMUNITY): Payer: Self-pay | Admitting: Internal Medicine

## 2020-05-23 ENCOUNTER — Inpatient Hospital Stay (HOSPITAL_COMMUNITY)
Admission: RE | Admit: 2020-05-23 | Discharge: 2020-05-26 | DRG: 310 | Disposition: A | Discharge status: Home / Self Care | Payer: Medicare PPO | Source: Ambulatory Visit | Attending: Internal Medicine | Admitting: Internal Medicine

## 2020-05-23 ENCOUNTER — Ambulatory Visit (HOSPITAL_COMMUNITY)
Admission: RE | Admit: 2020-05-23 | Discharge: 2020-05-23 | Disposition: A | Discharge status: Home / Self Care | Payer: Medicare PPO | Source: Ambulatory Visit | Attending: Physician Assistant | Admitting: Physician Assistant

## 2020-05-23 VITALS — BP 128/72 | HR 77 | Ht 66.0 in | Wt 157.8 lb

## 2020-05-23 DIAGNOSIS — Z801 Family history of malignant neoplasm of trachea, bronchus and lung: Secondary | ICD-10-CM | POA: Diagnosis not present

## 2020-05-23 DIAGNOSIS — Z8249 Family history of ischemic heart disease and other diseases of the circulatory system: Secondary | ICD-10-CM | POA: Diagnosis not present

## 2020-05-23 DIAGNOSIS — I4819 Other persistent atrial fibrillation: Secondary | ICD-10-CM | POA: Diagnosis not present

## 2020-05-23 DIAGNOSIS — Z800 Family history of malignant neoplasm of digestive organs: Secondary | ICD-10-CM | POA: Diagnosis not present

## 2020-05-23 DIAGNOSIS — Z79899 Other long term (current) drug therapy: Secondary | ICD-10-CM

## 2020-05-23 DIAGNOSIS — E785 Hyperlipidemia, unspecified: Secondary | ICD-10-CM | POA: Diagnosis present

## 2020-05-23 DIAGNOSIS — I10 Essential (primary) hypertension: Secondary | ICD-10-CM | POA: Diagnosis not present

## 2020-05-23 DIAGNOSIS — Z87891 Personal history of nicotine dependence: Secondary | ICD-10-CM

## 2020-05-23 DIAGNOSIS — Z7901 Long term (current) use of anticoagulants: Secondary | ICD-10-CM | POA: Diagnosis not present

## 2020-05-23 LAB — BASIC METABOLIC PANEL
Anion gap: 6 (ref 5–15)
BUN: 13 mg/dL (ref 8–23)
CO2: 28 mmol/L (ref 22–32)
Calcium: 9.1 mg/dL (ref 8.9–10.3)
Chloride: 102 mmol/L (ref 98–111)
Creatinine, Ser: 0.76 mg/dL (ref 0.61–1.24)
GFR, Estimated: >60 mL/min (ref >60)
Glucose, Bld: 98 mg/dL (ref 70–99)
Potassium: 4.4 mmol/L (ref 3.5–5.1)
Sodium: 136 mmol/L (ref 135–145)

## 2020-05-23 LAB — MAGNESIUM: Magnesium: 2.2 mg/dL (ref 1.7–2.4)

## 2020-05-23 MED ORDER — SODIUM CHLORIDE 0.9 % IV SOLN: 250.0000 mL | INTRAVENOUS | Status: DC | PRN

## 2020-05-23 MED ORDER — ATORVASTATIN CALCIUM 10 MG PO TABS
10.0000 mg | ORAL_TABLET | Freq: Every day | ORAL | Status: DC
  Administered 2020-05-23 – 2020-05-25 (×3): 10 mg via ORAL
  Filled 2020-05-23 (×3): qty 1

## 2020-05-23 MED ORDER — SODIUM CHLORIDE 0.9% FLUSH
3.0000 mL | Freq: Two times a day (BID) | INTRAVENOUS | Status: DC
  Administered 2020-05-23 – 2020-05-26 (×6): 3 mL via INTRAVENOUS

## 2020-05-23 MED ORDER — DOFETILIDE 500 MCG PO CAPS
500.0000 ug | ORAL_CAPSULE | Freq: Two times a day (BID) | ORAL | Status: DC
  Administered 2020-05-23 – 2020-05-26 (×6): 500 ug via ORAL
  Filled 2020-05-23 (×6): qty 1

## 2020-05-23 MED ORDER — METOPROLOL TARTRATE 25 MG PO TABS
25.0000 mg | ORAL_TABLET | Freq: Two times a day (BID) | ORAL | Status: DC
  Administered 2020-05-23 – 2020-05-26 (×6): 25 mg via ORAL
  Filled 2020-05-23 (×6): qty 1

## 2020-05-23 MED ORDER — ATORVASTATIN CALCIUM 10 MG PO TABS: 10.0000 mg | ORAL_TABLET | Freq: Every day | ORAL | Status: DC

## 2020-05-23 MED ORDER — ADULT MULTIVITAMIN W/MINERALS CH: 1.0000 | ORAL_TABLET | ORAL | Status: DC

## 2020-05-23 MED ORDER — MELATONIN 5 MG PO TABS
5.0000 mg | ORAL_TABLET | Freq: Every day | ORAL | Status: DC
  Administered 2020-05-23 – 2020-05-25 (×3): 5 mg via ORAL
  Filled 2020-05-23 (×3): qty 1

## 2020-05-23 MED ORDER — CALCIUM CARBONATE ANTACID 500 MG PO CHEW: 1.0000 | CHEWABLE_TABLET | Freq: Every day | ORAL | Status: DC | PRN

## 2020-05-23 MED ORDER — SALINE SPRAY 0.65 % NA SOLN
1.0000 | NASAL | Status: DC | PRN
  Filled 2020-05-23: qty 44

## 2020-05-23 MED ORDER — SODIUM CHLORIDE 0.9% FLUSH: 3.0000 mL | INTRAVENOUS | Status: DC | PRN

## 2020-05-23 MED ORDER — APIXABAN 5 MG PO TABS
5.0000 mg | ORAL_TABLET | Freq: Two times a day (BID) | ORAL | Status: DC
  Administered 2020-05-23 – 2020-05-26 (×6): 5 mg via ORAL
  Filled 2020-05-23 (×6): qty 1

## 2020-05-23 NOTE — H&P (Addendum)
Electrophysiology H&P  Note    Primary Care Physician: Ronnie Arnt, MD Referring Physician: [name] EP: Dr. [name] is a 75 y.o. male with a h/o afib, frequent cardioversions in  the past. He has had afib/flutter ablation in 2012  and afib ablation fall of 2018. He was on norpace at one time, changed to amiodarone which was stopped shortly after last ablation, then back to Norpace. Unfortunately, he continued to have symptomatic breakthrough episodes of afib and the decision was made to stop Norpace and load on dofetilide.   On follow up today, patient presents for dofetilide admission. He states he stopped Norpace 5 days ago and has not missed any doses of anticoagulation in the last 3 weeks. He is in SR today.    Today, he denies symptoms of  palpitations, chest Black, shortness of breath, orthopnea, PND, lower extremity edema, dizziness, presyncope, syncope, or neurologic sequela.  .The patient is tolerating medications without difficulties and is otherwise without complaint today.   Past Medical History:  Diagnosis Date  . Current use of long term anticoagulation 11/06/2018  . DDD (degenerative disc disease)   . HLD (hyperlipidemia)   . Hypertension   . 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 Persistent atrial fibrillation ([place])    s/p PVI 3/12   Past Surgical History:  Procedure Laterality Date  . APPENDECTOMY    . atrial fibrillation ablation  03/27/10   PVI with CTI ablation by Ronnie Black  . ATRIAL FIBRILLATION ABLATION N/A 11/21/2016   Procedure: ATRIAL FIBRILLATION ABLATION;  Surgeon: Ronnie Grayer, MD;  Location: [facility];  Service: Cardiovascular;  Laterality: N/A;  . CARDIOVERSION    . CARDIOVERSION  04/26/2011   Procedure: CARDIOVERSION;  Surgeon: Ronnie Dresser, MD;  Location: [place];  Service: Cardiovascular;  Laterality: N/A;  . CARDIOVERSION N/A 04/28/2012   Procedure: CARDIOVERSION;  Surgeon: Ronnie Headings, MD;   Location: [place];  Service: Cardiovascular;  Laterality: N/A;  . CARDIOVERSION N/A 07/28/2012   Procedure: CARDIOVERSION;  Surgeon: Ronnie Perla, MD;  Location: [facility] ENDOSCOPY;  Service: Cardiovascular;  Laterality: N/A;  . CARDIOVERSION N/A 08/14/2012   Procedure: CARDIOVERSION;  Surgeon: Ronnie Hector, MD;  Location: [place];  Service: Cardiovascular;  Laterality: N/A;  . CARDIOVERSION N/A 08/11/2014   Procedure: CARDIOVERSION;  Surgeon: Ronnie Records, MD;  Location: [place];  Service: Cardiovascular;  Laterality: N/A;  . CARDIOVERSION N/A 08/26/2014   Procedure: CARDIOVERSION;  Surgeon: Ronnie Klein, MD;  Location: [place];  Service: Cardiovascular;  Laterality: N/A;  . CARDIOVERSION N/A 10/10/2016   Procedure: CARDIOVERSION;  Surgeon: Ronnie Dresser, MD;  Location: [facility] ENDOSCOPY;  Service: Cardiovascular;  Laterality: N/A;  . CARDIOVERSION N/A 12/04/2016   Procedure: CARDIOVERSION;  Surgeon: Ronnie Dresser, MD;  Location: [place];  Service: Cardiovascular;  Laterality: N/A;  . CARDIOVERSION N/A 06/09/2018   Procedure: CARDIOVERSION;  Surgeon: Ronnie Spark, MD;  Location: [facility] ENDOSCOPY;  Service: Cardiovascular;  Laterality: N/A;  . CARDIOVERSION N/A 08/17/2019   Procedure: CARDIOVERSION;  Surgeon: Ronnie Latch, MD;  Location: [facility] ENDOSCOPY;  Service: Cardiovascular;  Laterality: N/A;  . CARDIOVERSION N/A 11/18/2019   Procedure: CARDIOVERSION;  Surgeon: Ronnie Bergeron, MD;  Location: [place];  Service: Cardiovascular;  Laterality: N/A;  . CARDIOVERSION N/A 03/09/2020   Procedure: CARDIOVERSION;  Surgeon: Ronnie Pain, MD;  Location: [place];  Service: Cardiovascular;  Laterality: N/A;  . SKIN CANCER EXCISION    .  TEE WITHOUT CARDIOVERSION  04/26/2011   Procedure: TRANSESOPHAGEAL ECHOCARDIOGRAM (TEE);  Surgeon: Ronnie Dresser, MD;  Location: [facility] ENDOSCOPY;  Service: Cardiovascular;  Laterality: N/A;  to be done at 1330  . TEE WITHOUT  CARDIOVERSION N/A 04/28/2012   Procedure: TRANSESOPHAGEAL ECHOCARDIOGRAM (TEE);  Surgeon: Ronnie Headings, MD;  Location: [place];  Service: Cardiovascular;  Laterality: N/A;  . WISDOM TOOTH EXTRACTION      Current Facility-Administered Medications  Medication Dose Route Frequency Provider Last Rate Last Admin  . 0.9 %  sodium chloride infusion  250 mL Intravenous PRN Fenton, Ronnie R, PA      . apixaban (ELIQUIS) tablet 5 mg  5 mg Oral BID Fenton, Ronnie R, PA      . [START ON 05/24/2020] atorvastatin (LIPITOR) tablet 10 mg  10 mg Oral Daily Fenton, Ronnie R, PA      . calcium carbonate (TUMS - dosed in mg elemental calcium) chewable tablet 200 mg of elemental calcium  1 tablet Oral Daily PRN Fenton, Ronnie R, PA      . dofetilide (TIKOSYN) capsule 500 mcg  500 mcg Oral BID Fenton, Ronnie R, PA      . melatonin tablet 5 mg  5 mg Oral QHS Fenton, Ronnie R, PA      . metoprolol tartrate (LOPRESSOR) tablet 25 mg  25 mg Oral BID Fenton, Ronnie R, PA      . [START ON 05/24/2020] multivitamin with minerals tablet 1 tablet  1 tablet Oral Once per day on Mon Wed Fri Fenton, Ronnie R, PA      . sodium chloride (OCEAN) 0.65 % nasal spray 1 spray  1 spray Each Nare PRN Fenton, Ronnie R, PA      . sodium chloride flush (NS) 0.9 % injection 3 mL  3 mL Intravenous Q12H Fenton, Ronnie R, PA      . sodium chloride flush (NS) 0.9 % injection 3 mL  3 mL Intravenous PRN Fenton, Ronnie R, PA        No Known Allergies  Social History   Socioeconomic History  . Marital status: Married    Spouse name: Not on file  . Number of children: 2  . Years of education: Not on file  . Highest education level: Not on file  Occupational History  . Occupation: professor    Employer: [employer]  Tobacco Use  . Smoking status: Former Smoker    Packs/day: 1.00    Years: 15.00    Pack years: 15.00    Types: Cigarettes    Quit date: 11/28/1975    Years since quitting: 44.5  . Smokeless tobacco: Never Used  Vaping Use  .  Vaping Use: Never used  Substance and Sexual Activity  . Alcohol use: Yes    Alcohol/week: 6.0 standard drinks    Types: 3 Glasses of wine, 3 Cans of beer per week  . Drug use: No  . Sexual activity: Yes  Other Topics Concern  . Not on file  Social History Narrative    a professor of Therapist, occupational at [employer] in [place] @ [employer] Strain: Not on [employer] Insecurity: Not on file  Transportation Needs: Not on file  Physical Activity: Not on file  Stress: Not on file  Social Connections: Not on file  Intimate Partner Violence: Not on file    Family History  Problem Relation Age of Onset  .  Coronary artery disease Other   . Colon cancer Mother   . Lung cancer Mother   . Colon cancer Father   . Early death Neg Hx   . Hearing loss Neg Hx   . Heart disease Neg Hx   . Hyperlipidemia Neg Hx   . Hypertension Neg Hx   . Kidney disease Neg Hx   . Stroke Neg Hx     ROS- All systems are reviewed and negative except as per the HPI above  Physical Exam: Vitals:   05/23/20 1226  BP: 132/85  Pulse: 71  Resp: 18  Temp: 98.7 F (37.1 C)  TempSrc: Oral  SpO2: 97%  Weight: 70.9 kg  Height: 5\' 6"  (1.676 m)    GEN- The patient is a well appearing elderly male, alert and oriented x 3 today.   HEENT-head normocephalic, atraumatic, sclera clear, conjunctiva pink, hearing intact, trachea midline. Lungs- Clear to ausculation bilaterally, normal work of breathing Heart- Regular rate and rhythm, no murmurs, rubs or gallops  GI- soft, NT, ND, + BS Extremities- no clubbing, cyanosis, or edema MS- no significant deformity or atrophy Skin- no rash or lesion Psych- euthymic mood, full affect Neuro- strength and sensation are intact   EKG-  SR Vent. rate 77 BPM PR interval 186 ms QRS duration 94 ms QT/QTcB 394/445 ms   Echo 11/23/19 1. The apical views are concerning for apical LV  hypertrophy. . Left  ventricular ejection fraction, by estimation, is 60 to 65%. The left  ventricle has normal function. The left ventricle has no regional wall  motion abnormalities. 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 Left ventricular diastolic parameters are  indeterminate.  2. Right ventricular systolic function is normal. The right ventricular  size is normal.  3. The mitral valve is normal in structure. Mild mitral valve  regurgitation. No evidence of mitral stenosis.  4. The aortic valve is normal in structure. Aortic valve regurgitation is  not visualized. No aortic stenosis is present.    Assessment and Plan:  1. Persistent atrial fibrillation/atrial flutter  S/p ablation 03/2010 and 11/21/16 Patient presents for dofetilide loading. Patient aware of price of dofetilide. Continue Eliquis 5 mg BID, states no missed doses in the last 3 weeks. No recent benadryl use PharmD has screened medications, he has stopped Norpace 5 days ago. QTc in SR 445 ms Labs today show creatinine at 0.76, K+ 4.4 and mag 2.2, CrCl calculated at 83 mL/min chadsvasc score of 3  Continue metoprolol tartrate  25 mg bid     2. HCM Nonobstructive   Patient presents for tikosyn loading without change to above history or physical.   Pt in NSR on arrival.   Legrand Como "Jonni Sanger" Chalmers Cater, PA-C  05/23/2020 2:00 PM    I have seen, examined the patient, and reviewed the above assessment and plan.  Changes to above are made where necessary.  On exam, RRR.  He presents for initiation for tikosyn.  Reports compliance with [place] without interruption.  Co Sign: Ronnie Grayer, MD

## 2020-05-23 NOTE — Progress Notes (Signed)
Pharmacy: Dofetilide (Tikosyn) - Initial Consult Assessment and Electrolyte Replacement  Pharmacy consulted to assist in monitoring and replacing electrolytes in this 75 y.o. male admitted on 05/23/2020 undergoing dofetilide initiation. First dofetilide dose 500 mcg BID: 05/23/2020 8pm  Assessment:  Patient Exclusion Criteria: If any screening criteria checked as "Yes", then  patient  should NOT receive dofetilide until criteria item is corrected.  If "Yes" please indicate correction plan.  YES  NO Patient  Exclusion Criteria Correction Plan   [x]   []   Baseline QTc interval is greater than or equal to 440 msec. IF above YES box checked dofetilide contraindicated unless patient has ICD; then may proceed if QTc 500-550 msec or with known ventricular conduction abnormalities may proceed with QTc 550-600 msec. QTc =   Per EP note OK to initiate Tikosyn   []   [x]   Patient is known or suspected to have a digoxin level greater than 2 ng/ml: No results found for: DIGOXIN     []   [x]   Creatinine clearance less than 20 ml/min (calculated using Cockcroft-Gault, actual body weight and serum creatinine): Estimated Creatinine Clearance: 70.9 mL/min (by C-G formula based on SCr of 0.76 mg/dL).     []   [x]  Patient has received drugs known to prolong the QT intervals within the last 48 hours (phenothiazines, tricyclics or tetracyclic antidepressants, erythromycin, H-1 antihistamines, cisapride, fluoroquinolones, azithromycin). Drugs not listed above may have an, as yet, undetected potential to prolong the QT interval, updated information on QT prolonging agents is available at this website:QT prolonging agents or www.crediblemeds.org    []   [x]   Patient received a dose of hydrochlorothiazide (Oretic) alone or in any combination including triamterene (Dyazide, Maxzide) in the last 48 hours.    []   [x]  Patient received a medication known to increase dofetilide plasma concentrations prior to initial  dofetilide dose:  . Trimethoprim (Primsol, Proloprim) in the last 36 hours . Verapamil (Calan, Verelan) in the last 36 hours or a sustained release dose in the last 72 hours . Megestrol (Megace) in the last 5 days  . Cimetidine (Tagamet) in the last 6 hours . Ketoconazole (Nizoral) in the last 24 hours . Itraconazole (Sporanox) in the last 48 hours  . Prochlorperazine (Compazine) in the last 36 hours     []   [x]   Patient is known to have a history of torsades de pointes; congenital or acquired long QT syndromes.    []   [x]   Patient has received a Class 1 antiarrhythmic with less than 2 half-lives since last dose. (Disopyramide, Quinidine, Procainamide, Lidocaine, Mexiletine, Flecainide, Propafenone)    []   [x]   Patient has received amiodarone therapy in the past 3 months or amiodarone level is greater than 0.3 ng/ml.    Patient has been appropriately anticoagulated with Eliquis 5 mg BID for >3 weeks  Labs:    Component Value Date/Time   K 4.4 05/23/2020 1141   MG 2.2 05/23/2020 1141     Plan: Initiate dofetilide 500 mcg BID. Monitor QTc 2-3 hours after initiation and assess.  Potassium: K >/= 4: Appropriate to initiate Tikosyn, no replacement needed    Magnesium: Mg >2: Appropriate to initiate Tikosyn, no replacement needed     Thank you for allowing pharmacy to participate in this patient's care   [place] 05/23/2020  3:14 PM

## 2020-05-23 NOTE — Progress Notes (Signed)
Patient ID: Ronnie Black, male   DOB: Sep 26, 1945, 75 y.o.   MRN: [id number]     Primary Care Physician: [name], MD Referring Physician: Dr. Ron Parker EP: Dr. Hilliard Clark is a 75 y.o. male with a h/o afib, frequent cardioversions in  the past. He has had afib/flutter ablation in 2012  and afib ablation fall of 2018. He was on norpace at one time, changed to amiodarone which was stopped shortly after last ablation, then back to Norpace. Unfortunately, he continued to have symptomatic breakthrough episodes of afib and the decision was made to stop Norpace and load on dofetilide.   On follow up today, patient presents for dofetilide admission. He states he stopped Norpace 5 days ago and has not missed any doses of anticoagulation in the last 3 weeks. He is in SR today.    Today, he denies symptoms of  palpitations, chest pain, shortness of breath, orthopnea, PND, lower extremity edema, dizziness, presyncope, syncope, or neurologic sequela.  .The patient is tolerating medications without difficulties and is otherwise without complaint today.   Past Medical History:  Diagnosis Date  . Current use of long term anticoagulation 11/06/2018  . DDD (degenerative disc disease)   . HLD (hyperlipidemia)   . Hypertension   . 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 Persistent atrial fibrillation ([place])    s/p PVI 3/12   Past Surgical History:  Procedure Laterality Date  . APPENDECTOMY    . atrial fibrillation ablation  03/27/10   PVI with CTI ablation by JA  . ATRIAL FIBRILLATION ABLATION N/A 11/21/2016   Procedure: ATRIAL FIBRILLATION ABLATION;  Surgeon: [name], MD;  Location: [place] [facility];  Service: Cardiovascular;  Laterality: N/A;  . CARDIOVERSION    . CARDIOVERSION  04/26/2011   Procedure: CARDIOVERSION;  Surgeon: Larey Dresser, MD;  Location: [place];  Service: Cardiovascular;  Laterality: N/A;  . CARDIOVERSION N/A 04/28/2012    Procedure: CARDIOVERSION;  Surgeon: Thayer Headings, MD;  Location: [place];  Service: Cardiovascular;  Laterality: N/A;  . CARDIOVERSION N/A 07/28/2012   Procedure: CARDIOVERSION;  Surgeon: Lelon Perla, MD;  Location: [facility] ENDOSCOPY;  Service: Cardiovascular;  Laterality: N/A;  . CARDIOVERSION N/A 08/14/2012   Procedure: CARDIOVERSION;  Surgeon: Josue Hector, MD;  Location: [place];  Service: Cardiovascular;  Laterality: N/A;  . CARDIOVERSION N/A 08/11/2014   Procedure: CARDIOVERSION;  Surgeon: Fay Records, MD;  Location: [place];  Service: Cardiovascular;  Laterality: N/A;  . CARDIOVERSION N/A 08/26/2014   Procedure: CARDIOVERSION;  Surgeon: Sanda Klein, MD;  Location: [place];  Service: Cardiovascular;  Laterality: N/A;  . CARDIOVERSION N/A 10/10/2016   Procedure: CARDIOVERSION;  Surgeon: Larey Dresser, MD;  Location: [facility] ENDOSCOPY;  Service: Cardiovascular;  Laterality: N/A;  . CARDIOVERSION N/A 12/04/2016   Procedure: CARDIOVERSION;  Surgeon: Larey Dresser, MD;  Location: [facility] ENDOSCOPY;  Service: Cardiovascular;  Laterality: N/A;  . CARDIOVERSION N/A 06/09/2018   Procedure: CARDIOVERSION;  Surgeon: Dorothy Spark, MD;  Location: [facility] ENDOSCOPY;  Service: Cardiovascular;  Laterality: N/A;  . CARDIOVERSION N/A 08/17/2019   Procedure: CARDIOVERSION;  Surgeon: [name], MD;  Location: [facility] ENDOSCOPY;  Service: Cardiovascular;  Laterality: N/A;  . CARDIOVERSION N/A 11/18/2019   Procedure: CARDIOVERSION;  Surgeon: [name], MD;  Location: [place];  Service: Cardiovascular;  Laterality: N/A;  . CARDIOVERSION N/A 03/09/2020   Procedure: CARDIOVERSION;  Surgeon: [name], MD;  Location: [facility]  ENDOSCOPY;  Service: Cardiovascular;  Laterality: N/A;  . SKIN CANCER EXCISION    . TEE WITHOUT CARDIOVERSION  04/26/2011   Procedure: TRANSESOPHAGEAL ECHOCARDIOGRAM (TEE);  Surgeon: Larey Dresser, MD;  Location: [facility] ENDOSCOPY;  Service: Cardiovascular;   Laterality: N/A;  to be done at 1330  . TEE WITHOUT CARDIOVERSION N/A 04/28/2012   Procedure: TRANSESOPHAGEAL ECHOCARDIOGRAM (TEE);  Surgeon: Thayer Headings, MD;  Location: [place];  Service: Cardiovascular;  Laterality: N/A;  . WISDOM TOOTH EXTRACTION      Current Outpatient Medications  Medication Sig Dispense Refill  . atorvastatin (LIPITOR) 10 MG tablet TAKE 1 TABLET BY MOUTH EVERY DAY 90 tablet 3  . calcium carbonate (TUMS - DOSED IN MG ELEMENTAL CALCIUM) 500 MG chewable tablet Chew 1 tablet by mouth daily as needed for indigestion or heartburn.    . disopyramide (NORPACE) 100 MG capsule Take 1 capsule (100 mg total) by mouth in the morning, at noon, and at bedtime. 270 capsule 3  . ELIQUIS 5 MG TABS tablet TAKE 1 TABLET BY MOUTH TWICE A DAY 180 tablet 1  . ibuprofen (ADVIL) 200 MG tablet Take 400 mg by mouth every 6 (six) hours as needed for moderate pain.    . melatonin 5 MG TABS Take 5 mg by mouth at bedtime.    . metoprolol tartrate (LOPRESSOR) 25 MG tablet TAKE 1 TABLET BY MOUTH TWICE A DAY 180 tablet 2  . Multiple Vitamin (MULTIVITAMIN WITH MINERALS) TABS tablet Take 1 tablet by mouth 3 (three) times a week.    . sildenafil (VIAGRA) 25 MG tablet Take 25 mg by mouth as needed for erectile dysfunction.     . sodium chloride (OCEAN) 0.65 % SOLN nasal spray Place 1 spray into both nostrils as needed (dryness).     No current facility-administered medications for this visit.    No Known Allergies  Social History   Socioeconomic History  . Marital status: Married    Spouse name: Not on file  . Number of children: 2  . Years of education: Not on file  . Highest education level: Not on file  Occupational History  . Occupation: professor    Employer: [employer]  Tobacco Use  . Smoking status: Former Smoker    Packs/day: 1.00    Years: 15.00    Pack years: 15.00    Types: Cigarettes    Quit date: 11/28/1975    Years since quitting: 44.5  . Smokeless tobacco: Never  Used  Vaping Use  . Vaping Use: Never used  Substance and Sexual Activity  . Alcohol use: Yes    Alcohol/week: 6.0 standard drinks    Types: 3 Glasses of wine, 3 Cans of beer per week    Comment: rare wine  . Drug use: No  . Sexual activity: Yes  Other Topics Concern  . 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 Coronary artery disease Other   . Colon cancer Mother   . Lung cancer Mother   . Colon cancer Father   .  Early death Neg Hx   . Hearing loss Neg Hx   . Heart disease Neg Hx   . Hyperlipidemia Neg Hx   . Hypertension Neg Hx   . Kidney disease Neg Hx   . Stroke Neg Hx     ROS- All systems are reviewed and negative except as per the HPI above  Physical Exam: There were no vitals filed for this visit.  GEN- The patient is a well appearing elderly male, alert and oriented x 3 today.   HEENT-head normocephalic, atraumatic, sclera clear, conjunctiva pink, hearing intact, trachea midline. Lungs- Clear to ausculation bilaterally, normal work of breathing Heart- Regular rate and rhythm, no murmurs, rubs or gallops  GI- soft, NT, ND, + BS Extremities- no clubbing, cyanosis, or edema MS- no significant deformity or atrophy Skin- no rash or lesion Psych- euthymic mood, full affect Neuro- strength and sensation are intact   EKG-  SR Vent. rate 77 BPM PR interval 186 ms QRS duration 94 ms QT/QTcB 394/445 ms   Echo 11/23/19 1. The apical views are concerning for apical LV hypertrophy. . Left  ventricular ejection fraction, by estimation, is 60 to 65%.  The left  ventricle has normal function. The left ventricle has no regional wall  motion abnormalities. 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 Left ventricular diastolic parameters are  indeterminate.  2. Right ventricular systolic function is normal. The right ventricular  size is normal.  3. The mitral valve is normal in structure. Mild mitral valve  regurgitation. No evidence of mitral stenosis.  4. The aortic valve is normal in structure. Aortic valve regurgitation is  not visualized. No aortic stenosis is present.    Assessment and Plan:  1. Persistent atrial fibrillation/atrial flutter  S/p ablation 03/2010 and 11/21/16 Patient presents for dofetilide loading. Patient aware of price of dofetilide. Continue Eliquis 5 mg BID, states no missed doses in the last 3 weeks. No recent benadryl use PharmD has screened medications, he has stopped Norpace 5 days ago. QTc in SR 445 ms Labs today show creatinine at 0.76, K+ 4.4 and mag 2.2, CrCl calculated at 83 mL/min chadsvasc score of 3  Continue metoprolol tartrate  25 mg bid     2. HCM Nonobstructive    To be admitted later today once a bed becomes available.    [facility] [address] [place], [place] [address] [phone]

## 2020-05-24 LAB — BASIC METABOLIC PANEL
Anion gap: 8 (ref 5–15)
BUN: 11 mg/dL (ref 8–23)
CO2: 28 mmol/L (ref 22–32)
Calcium: 9.4 mg/dL (ref 8.9–10.3)
Chloride: 101 mmol/L (ref 98–111)
Creatinine, Ser: 0.76 mg/dL (ref 0.61–1.24)
GFR, Estimated: >60 mL/min (ref >60)
Glucose, Bld: 90 mg/dL (ref 70–99)
Potassium: 4 mmol/L (ref 3.5–5.1)
Sodium: 137 mmol/L (ref 135–145)

## 2020-05-24 LAB — MAGNESIUM: Magnesium: 2.1 mg/dL (ref 1.7–2.4)

## 2020-05-24 NOTE — Progress Notes (Addendum)
Morning EKG reviewed  Shows remains in NSR at 61 bpm with stable QTc at ~480 ms.  Continue Tikosyn 500 mcg BID.   Pt will not require DCCV. Plan for home tomorrow if QTc remains stable.    Shirley Friar, PA-C  Pager: [phone]  05/24/2020 11:07 AM

## 2020-05-24 NOTE — Progress Notes (Addendum)
Electrophysiology Rounding Note  Patient Name: Ronnie Black Date of Encounter: 05/24/2020  Primary Cardiologist: None  Electrophysiologist: Dr. [name]   Subjective   Pt remains in NSR on Tikosyn 500 mcg BID   QTc from EKG last pm shows stable QTc at ~460 ms  The patient is doing well today.  At this time, the patient denies chest pain, shortness of breath, or any new concerns.  Inpatient Medications    Scheduled Meds: . apixaban  5 mg Oral BID  . atorvastatin  10 mg Oral QHS  . dofetilide  500 mcg Oral BID  . melatonin  5 mg Oral QHS  . metoprolol tartrate  25 mg Oral BID  . multivitamin with minerals  1 tablet Oral Once per day on Mon Wed Fri  . sodium chloride flush  3 mL Intravenous Q12H   Continuous Infusions: . sodium chloride     PRN Meds: sodium chloride, calcium carbonate, sodium chloride, sodium chloride flush   Vital Signs    Vitals:   05/23/20 2033 05/23/20 2159 05/24/20 0019 05/24/20 0523  BP: (!) 153/84 140/87 138/78 140/82  Pulse: 83 72 68 69  Resp: 16  15 18   Temp: 98.4 F (36.9 C)  97.9 F (36.6 C) 97.8 F (36.6 C)  TempSrc: Oral  Oral Oral  SpO2: 94%  98% 97%  Weight:    68.6 kg  Height:       No intake or output data in the 24 hours ending 05/24/20 0702 Filed Weights   05/23/20 1226 05/24/20 0523  Weight: 70.9 kg 68.6 kg    Physical Exam    GEN- The patient is well appearing, alert and oriented x 3 today.   Head- normocephalic, atraumatic Eyes-  Sclera clear, conjunctiva pink Ears- hearing intact Oropharynx- clear Neck- supple Lungs- Clear to ausculation bilaterally, normal work of breathing Heart- Regular rate and rhythm, no murmurs, rubs or gallops GI- soft, NT, ND, + BS Extremities- no clubbing, cyanosis, or edema Skin- no rash or lesion Psych- euthymic mood, full affect Neuro- strength and sensation are intact  Labs    CBC No results for input(s): WBC, NEUTROABS, HGB, HCT, MCV, PLT in the last 72 hours. Basic  Metabolic Panel Recent Labs    05/23/20 1141 05/24/20 0304  NA 136 137  K 4.4 4.0  CL 102 101  CO2 28 28  GLUCOSE 98 90  BUN 13 11  CREATININE 0.76 0.76  CALCIUM 9.1 9.4  MG 2.2 2.1    Potassium  Date/Time Value Ref Range Status  05/24/2020 03:04 AM 4.0 3.5 - 5.1 mmol/L Final   Magnesium  Date/Time Value Ref Range Status  05/24/2020 03:04 AM 2.1 1.7 - 2.4 mg/dL Final    Comment:    Performed at [facility], [place] [address], [place], [place] [address]    Telemetry    NSR 60-80s, occasional PVCs which preceded Tikosyn (personally reviewed)  Radiology    No results found.   Patient Profile     Ronnie Black is a 75 y.o. male with a past medical history significant for persistent atrial fibrillation.  They were admitted for tikosyn load.   Assessment & Plan    1. Persistent atrial fibrillation Pt remains in NSR on Tikosyn 500 mcg BID  Continue Eliquis Electrolytes stable. K 4.0, Mg 2.1 CHA2DS2VASC is at least 3.  2. HCM EF 60-65% 11/2019 Non-obstructive by Echo 11/2019  Pt will not require DCCV. Plan for home on Friday  if QTc remains stable.   For questions or updates, please contact [place] Please consult [email or web address] for contact info under Cardiology/STEMI.  Signed, Shirley Friar, PA-C  05/24/2020, 7:02 AM    I have seen, examined the patient, and reviewed the above assessment and plan.  Changes to above are made where necessary.  On exam, RRR.  Doing well with tikosyn.  Qt is stable.  Continue tikosyn load.  Co Sign: [name], MD 05/24/2020 5:30 PM

## 2020-05-24 NOTE — Discharge Instructions (Signed)

## 2020-05-24 NOTE — Progress Notes (Signed)
Pharmacy: Dofetilide (Tikosyn) - Follow Up Assessment and Electrolyte Replacement  Pharmacy consulted to assist in monitoring and replacing electrolytes in this 75 y.o. male admitted on 05/23/2020 undergoing dofetilide initiation. First dofetilide 573mcg bid dose: 5/3 at 8pm and in SR  Anticoagulated with apixaban 5mg  BID  Labs:    Component Value Date/Time   K 4.0 05/24/2020 0304   MG 2.1 05/24/2020 0304     Plan: Potassium: K >/= 4: No additional supplementation needed  Magnesium: Mg > 2: No additional supplementation needed   Bonnita Nasuti Pharm.D. CPP, BCPS Clinical Pharmacist [phone] 05/24/2020 10:39 AM

## 2020-05-25 LAB — BASIC METABOLIC PANEL
Anion gap: 8 (ref 5–15)
BUN: 12 mg/dL (ref 8–23)
CO2: 30 mmol/L (ref 22–32)
Calcium: 9.4 mg/dL (ref 8.9–10.3)
Chloride: 99 mmol/L (ref 98–111)
Creatinine, Ser: 0.85 mg/dL (ref 0.61–1.24)
GFR, Estimated: >60 mL/min (ref >60)
Glucose, Bld: 88 mg/dL (ref 70–99)
Potassium: 4 mmol/L (ref 3.5–5.1)
Sodium: 137 mmol/L (ref 135–145)

## 2020-05-25 LAB — MAGNESIUM: Magnesium: 2.2 mg/dL (ref 1.7–2.4)

## 2020-05-25 NOTE — Care Management (Addendum)
05-25-20 1349 Tikosyn Load: Benefits check submitted for Tikosyn. Case Manager will follow for cost and outpatient pharmacy of choice. Bethena Roys, RN,BSN Case Manager

## 2020-05-25 NOTE — Progress Notes (Signed)
Morning EKG reviewed  Shows remains in NSR at 65 bpm with stable QTc at ~480 ms.  Continue Tikosyn 500 mcg BID.   Plan for home tomorrow if QTc remains stable after am dose.  Shirley Friar, PA-C  Pager: [phone]  05/25/2020 10:58 AM

## 2020-05-25 NOTE — Progress Notes (Signed)
Pharmacy: Dofetilide (Tikosyn) - Follow Up Assessment and Electrolyte Replacement  Pharmacy consulted to assist in monitoring and replacing electrolytes in this 75 y.o. male admitted on 05/23/2020 undergoing dofetilide initiation. First dofetilide 568mcg bid dose: 5/3 at 8pm and in SR  Anticoagulated with apixaban 5mg  BID Labs:    Component Value Date/Time   K 4.0 05/25/2020 0420   MG 2.2 05/25/2020 0420     Plan: Potassium: K >/= 4: No additional supplementation needed  Magnesium: Mg > 2: No additional supplementation needed    Bonnita Nasuti Pharm.D. CPP, BCPS Clinical Pharmacist [phone] 05/25/2020 7:36 AM

## 2020-05-25 NOTE — TOC Benefit Eligibility Note (Signed)
Transition of Care [facility]) Benefit Eligibility Note    Patient Details  Name: Ronnie Black MRN: [id number] Date of Birth: Aug 31, 1945   Medication/Dose: Dofetilide 548mcg,250mcg,125mcg bid for 30 day supply  Covered?: Yes  Tier:  (Teir 1)  Prescription Coverage Preferred Pharm[facility]: [facility]  Spoke with Person/Company/Phone Number:: er Beverlee Nims B. [place] help desk [phone]  Co-Pay: $10.00  Prior Approval: No  Deductible:  (no deductible)       Shelda Altes Phone Number: 05/25/2020, 2:10 PM

## 2020-05-25 NOTE — Progress Notes (Addendum)
Electrophysiology Rounding Note  Patient Name: Ronnie Black Date of Encounter: 05/25/2020  Primary Cardiologist: None  Electrophysiologist: Dr. [name]   Subjective   Pt remains in NSR on Tikosyn 500 mcg BID   QTc from EKG last pm shows stable QTc at ~480  The patient is doing well today.  At this time, the patient denies chest pain, shortness of breath, or any new concerns.  Inpatient Medications    Scheduled Meds: . apixaban  5 mg Oral BID  . atorvastatin  10 mg Oral QHS  . dofetilide  500 mcg Oral BID  . melatonin  5 mg Oral QHS  . metoprolol tartrate  25 mg Oral BID  . multivitamin with minerals  1 tablet Oral Once per day on Mon Wed Fri  . sodium chloride flush  3 mL Intravenous Q12H   Continuous Infusions: . sodium chloride     PRN Meds: sodium chloride, calcium carbonate, sodium chloride, sodium chloride flush   Vital Signs    Vitals:   05/24/20 2000 05/25/20 0422 05/25/20 0637 05/25/20 0759  BP: 132/78 130/79  (!) 148/94  Pulse: 80 80  74  Resp: 18 18    Temp: 98.3 F (36.8 C) 98 F (36.7 C)    TempSrc: Oral Oral    SpO2: 95% 96%    Weight:   68.6 kg   Height:        Intake/Output Summary (Last 24 hours) at 05/25/2020 0844 Last data filed at 05/25/2020 0800 Gross per 24 hour  Intake 243 ml  Output --  Net 243 ml   Filed Weights   05/23/20 1226 05/24/20 0523 05/25/20 0637  Weight: 70.9 kg 68.6 kg 68.6 kg    Physical Exam    GEN- The patient is well appearing, alert and oriented x 3 today.   Head- normocephalic, atraumatic Eyes-  Sclera clear, conjunctiva pink Ears- hearing intact Oropharynx- clear Neck- supple Lungs- Clear to ausculation bilaterally, normal work of breathing Heart- Regular rate and rhythm, no murmurs, rubs or gallops GI- soft, NT, ND, + BS Extremities- no clubbing, cyanosis, or edema Skin- no rash or lesion Psych- euthymic mood, full affect Neuro- strength and sensation are intact  Labs    CBC No results for  input(s): WBC, NEUTROABS, HGB, HCT, MCV, PLT in the last 72 hours. Basic Metabolic Panel Recent Labs    05/24/20 0304 05/25/20 0420  NA 137 137  K 4.0 4.0  CL 101 99  CO2 28 30  GLUCOSE 90 88  BUN 11 12  CREATININE 0.76 0.85  CALCIUM 9.4 9.4  MG 2.1 2.2    Potassium  Date/Time Value Ref Range Status  05/25/2020 04:20 AM 4.0 3.5 - 5.1 mmol/L Final   Magnesium  Date/Time Value Ref Range Status  05/25/2020 04:20 AM 2.2 1.7 - 2.4 mg/dL Final    Comment:    Performed at [facility], [place] [address] [place], [place] [address]    Telemetry    NSR 60-80s (personally reviewed)  Radiology    No results found.   Patient Profile     Ronnie Black is a 75 y.o. male with a past medical history significant for persistent atrial fibrillation.  They were admitted for tikosyn load.   Assessment & Plan    1. Persistent atrial fibrillation Pt remains in NSR on Tikosyn 500 mcg BID  Continue Eliquis Electrolytes stable.  CHA2DS2VASC is at least 3.   Plan for home tomorrow if QTc remains  stable.    For questions or updates, please contact [place] Please consult [email or web address] for contact info under Cardiology/STEMI.  Signed, Shirley Friar, PA-C  05/25/2020, 8:44 AM    I have seen, examined the patient, and reviewed the above assessment and plan.  Changes to above are made where necessary.  On exam, RRR.  Qt remains stable.  Continue tikosyn load.  Co Sign: [name], MD 05/25/2020 12:47 PM

## 2020-05-25 NOTE — Plan of Care (Signed)

## 2020-05-26 ENCOUNTER — Other Ambulatory Visit (HOSPITAL_COMMUNITY): Payer: Self-pay

## 2020-05-26 ENCOUNTER — Telehealth: Payer: Self-pay

## 2020-05-26 LAB — BASIC METABOLIC PANEL
Anion gap: 5 (ref 5–15)
BUN: 13 mg/dL (ref 8–23)
CO2: 30 mmol/L (ref 22–32)
Calcium: 9.2 mg/dL (ref 8.9–10.3)
Chloride: 101 mmol/L (ref 98–111)
Creatinine, Ser: 0.99 mg/dL (ref 0.61–1.24)
GFR, Estimated: >60 mL/min (ref >60)
Glucose, Bld: 94 mg/dL (ref 70–99)
Potassium: 4.3 mmol/L (ref 3.5–5.1)
Sodium: 136 mmol/L (ref 135–145)

## 2020-05-26 LAB — MAGNESIUM: Magnesium: 2.3 mg/dL (ref 1.7–2.4)

## 2020-05-26 MED ORDER — DOFETILIDE 500 MCG PO CAPS
500.0000 ug | ORAL_CAPSULE | Freq: Two times a day (BID) | ORAL | 6 refills | Status: DC
  Filled 2020-05-26: qty 60, 30d supply, fill #0

## 2020-05-26 NOTE — Progress Notes (Signed)
Pt is alert and oriented. Discharge instructions/ AVS given to pt. 

## 2020-05-26 NOTE — Discharge Summary (Addendum)
ELECTROPHYSIOLOGY PROCEDURE DISCHARGE SUMMARY    Patient ID: Ronnie Black,  MRN: [id number], DOB/AGE: September 15, 1945 75 y.o.  Admit date: 05/23/2020 Discharge date: 05/26/2020  Primary Care Physician: [name], MD  Primary Cardiologist: None  Electrophysiologist: None   Primary Discharge Diagnosis:  1.  Persistent atrial fibrillation status post Tikosyn loading this admission  No Known Allergies   Procedures This Admission:  1.  Tikosyn loading  Brief HPI: Ronnie Black is a 75 y.o. male with a past medical history as noted above.  They were referred to EP in the outpatient setting for treatment options of atrial fibrillation.  Risks, benefits, and alternatives to Tikosyn were reviewed with the patient who wished to proceed.    Hospital Course:  The patient was admitted and Tikosyn was initiated.  Renal function and electrolytes were followed during the hospitalization.  Their QTc remained stable.  The patient presented in sinus brady / NSR and did not require cardioversion.  They were monitored until discharge on telemetry which demonstrated NSR with stable QTc and occasinoal PVCs which preceded tikosyn.  On the day of discharge, they were examined with Dr. [name]  who considered them stable for discharge to home.  Follow-up has been arranged with the Atrial Fibrillation clinic in approximately 1 week and with EP APP  in 4 weeks.   Physical Exam: Vitals:   05/25/20 1953 05/26/20 0600 05/26/20 0841 05/26/20 1100  BP:  123/85 117/83 129/81  Pulse: 84 81 88 67  Resp: 16 16 16 16   Temp: 98.4 F (36.9 C) (!) 97.5 F (36.4 C) 98.9 F (37.2 C) 97.7 F (36.5 C)  TempSrc: Oral Oral Oral Oral  SpO2:   96% 97%  Weight:      Height:        GEN- The patient is well appearing, alert and oriented x 3 today.   HEENT: normocephalic, atraumatic; sclera clear, conjunctiva pink; hearing intact; oropharynx clear; neck supple, no JVP Lymph- no cervical lymphadenopathy Lungs-  Clear to ausculation bilaterally, normal work of breathing.  No wheezes, rales, rhonchi Heart- Regular rate and rhythm, no murmurs, rubs or gallops, PMI not laterally displaced GI- soft, non-tender, non-distended, bowel sounds present, no hepatosplenomegaly Extremities- no clubbing, cyanosis, or edema; DP/PT/radial pulses 2+ bilaterally MS- no significant deformity or atrophy Skin- warm and dry, no rash or lesion Psych- euthymic mood, full affect Neuro- strength and sensation are intact   Labs:   Lab Results  Component Value Date   WBC 7.1 12/01/2019   HGB 15.0 03/09/2020   HCT 44.0 03/09/2020   MCV 87.3 12/01/2019   PLT 211 12/01/2019    Recent Labs  Lab 05/26/20 0301  NA 136  K 4.3  CL 101  CO2 30  BUN 13  CREATININE 0.99  CALCIUM 9.2  GLUCOSE 94     Discharge Medications:  Allergies as of 05/26/2020   No Known Allergies     Medication List    TAKE these medications   atorvastatin 10 MG tablet Commonly known as: LIPITOR TAKE 1 TABLET BY MOUTH EVERY DAY   dofetilide 500 MCG capsule Commonly known as: TIKOSYN Take 1 capsule (500 mcg total) by mouth 2 (two) times daily.   Eliquis 5 MG Tabs tablet Generic drug: apixaban TAKE 1 TABLET BY MOUTH TWICE A DAY   melatonin 5 MG Tabs Take 5 mg by mouth at bedtime.   metoprolol tartrate 25 MG tablet Commonly known as: LOPRESSOR TAKE 1 TABLET BY MOUTH  TWICE A DAY   multivitamin with minerals Tabs tablet Take 1 tablet by mouth 3 (three) times a week.   sildenafil 25 MG tablet Commonly known as: VIAGRA Take 25 mg by mouth as needed for erectile dysfunction.   sodium chloride 0.65 % Soln nasal spray Commonly known as: OCEAN Place 1 spray into both nostrils as needed (dryness).       Disposition:    Follow-up Information    [facility] Follow up.   Specialty: Cardiology Why: on 5/12 at 330 for post hospital tikosyn follow up Contact information: [address] [id number] [place] [address] [phone]              Duration of Discharge Encounter: Greater than 30 minutes including physician time.  Signed, Shirley Friar, PA-C  05/26/2020 12:16 PM   I have seen, examined the patient, and reviewed the above assessment and plan.  Changes to above are made where necessary.  On exam, RRR.  Qt is stable.  No arrhythmias observed.  DC to home with close outpatient follow-up  Co Sign: [name], MD 05/26/2020 4:51 PM

## 2020-05-26 NOTE — Progress Notes (Signed)
Morning EKG reviewed  Shows remains in NSR at 66 bpm with stable QTc at ~470 ms.  Continue Tikosyn 500 mcg BID.   Electrolytes stable. Plan for discharge today if QTc remains stable after am dose.    Shirley Friar, PA-C  Pager: [phone]  05/26/2020 7:16 AM

## 2020-05-26 NOTE — Progress Notes (Signed)
Pharmacy: Dofetilide (Tikosyn) - Follow Up Assessment and Electrolyte Replacement  Pharmacy consulted to assist in monitoring and replacing electrolytes in this 75 y.o. male admitted on 05/23/2020 undergoing dofetilide initiation. First dofetilide 526mcg bid dose: 5/3 at 8pm and in SR, renal function stable  Anticoagulated with apixaban 5mg  BID Labs:    Component Value Date/Time   K 4.3 05/26/2020 0301   MG 2.3 05/26/2020 0301     Plan: Potassium: K >/= 4: No additional supplementation needed  Magnesium: Mg > 2: No additional supplementation needed    Bonnita Nasuti Pharm.D. CPP, BCPS Clinical Pharmacist [phone] 05/26/2020 10:21 AM

## 2020-05-26 NOTE — Care Management (Signed)
1009 05-26-20 Case Manager spoke with patient regarding Tikosyn cost and pharmacy of choice. Patient is agreeable to cost and he wants his first fill via [place] and refills to be sent to [facility] [address], [place]. Bethena Roys, RN,BSN Case Manager

## 2020-05-26 NOTE — Telephone Encounter (Cosign Needed)
Transition Care Management Follow-up Telephone Call  Date of discharge and from where: [facility] 05/26/20  How have you been since you were released from the hospital? Doing great  Any questions or concerns? No  Items Reviewed:  Did the pt receive and understand the discharge instructions provided? Yes   Medications obtained and verified? Yes   Other? No   Any new allergies since your discharge? No   Dietary orders reviewed? No  Do you have support at home? Yes   Home Care and Equipment/Supplies: Were home health services ordered? not applicable If so, what is the name of the agency?   Has the agency set up a time to come to the patient's home? not applicable Were any new equipment or medical supplies ordered?  No What is the name of the medical supply agency?  Were you able to get the supplies/equipment? not applicable Do you have any questions related to the use of the equipment or supplies? No  Functional Questionnaire: (I = Independent and D = Dependent) ADLs: I  Bathing/Dressing- I  Meal Prep- I  Eating- I  Maintaining continence- I  Transferring/Ambulation- I  Managing Meds- I  Follow up appointments reviewed:   PCP Hospital f/u appt confirmed? No  pt will follow up with cardiology   Specialist Hospital f/u appt confirmed? Yes  Scheduled to see Cardiology  on 06/01/20 @ 3:30.  Are transportation arrangements needed? No   If their condition worsens, is the pt aware to call PCP or go to the Emergency Dept.? Yes  Was the patient provided with contact information for the PCP's office or ED? Yes  Was to pt encouraged to call back with questions or concerns? Yes

## 2020-05-26 NOTE — Care Management Important Message (Signed)
Important Message  Patient Details  Name: Ronnie Black MRN: [id number] Date of Birth: 1945-01-24   Medicare Important Message Given:  Yes     Shelda Altes 05/26/2020, 10:11 AM

## 2020-06-01 ENCOUNTER — Encounter (HOSPITAL_COMMUNITY): Payer: Self-pay | Admitting: Nurse Practitioner

## 2020-06-01 ENCOUNTER — Other Ambulatory Visit: Payer: Self-pay

## 2020-06-01 ENCOUNTER — Ambulatory Visit: Payer: Medicare PPO

## 2020-06-01 ENCOUNTER — Ambulatory Visit (HOSPITAL_COMMUNITY)
Admission: RE | Admit: 2020-06-01 | Discharge: 2020-06-01 | Disposition: A | Discharge status: Home / Self Care | Payer: Medicare PPO | Source: Ambulatory Visit | Attending: Nurse Practitioner | Admitting: Nurse Practitioner

## 2020-06-01 VITALS — BP 138/80 | HR 76 | Ht 66.0 in | Wt 159.0 lb

## 2020-06-01 DIAGNOSIS — I340 Nonrheumatic mitral (valve) insufficiency: Secondary | ICD-10-CM | POA: Insufficient documentation

## 2020-06-01 DIAGNOSIS — Z79899 Other long term (current) drug therapy: Secondary | ICD-10-CM | POA: Insufficient documentation

## 2020-06-01 DIAGNOSIS — Z7901 Long term (current) use of anticoagulants: Secondary | ICD-10-CM | POA: Diagnosis not present

## 2020-06-01 DIAGNOSIS — Z87891 Personal history of nicotine dependence: Secondary | ICD-10-CM | POA: Diagnosis not present

## 2020-06-01 DIAGNOSIS — I10 Essential (primary) hypertension: Secondary | ICD-10-CM | POA: Insufficient documentation

## 2020-06-01 DIAGNOSIS — E785 Hyperlipidemia, unspecified: Secondary | ICD-10-CM | POA: Diagnosis not present

## 2020-06-01 DIAGNOSIS — I4819 Other persistent atrial fibrillation: Secondary | ICD-10-CM | POA: Diagnosis not present

## 2020-06-01 DIAGNOSIS — D6869 Other thrombophilia: Secondary | ICD-10-CM

## 2020-06-01 DIAGNOSIS — I4892 Unspecified atrial flutter: Secondary | ICD-10-CM | POA: Insufficient documentation

## 2020-06-01 LAB — BASIC METABOLIC PANEL
Anion gap: 6 (ref 5–15)
BUN: 13 mg/dL (ref 8–23)
CO2: 28 mmol/L (ref 22–32)
Calcium: 9.2 mg/dL (ref 8.9–10.3)
Chloride: 103 mmol/L (ref 98–111)
Creatinine, Ser: 0.83 mg/dL (ref 0.61–1.24)
GFR, Estimated: >60 mL/min (ref >60)
Glucose, Bld: 98 mg/dL (ref 70–99)
Potassium: 3.9 mmol/L (ref 3.5–5.1)
Sodium: 137 mmol/L (ref 135–145)

## 2020-06-01 LAB — MAGNESIUM: Magnesium: 2.3 mg/dL (ref 1.7–2.4)

## 2020-06-01 MED ORDER — DOFETILIDE 500 MCG PO CAPS: 500.0000 ug | ORAL_CAPSULE | Freq: Two times a day (BID) | ORAL | 6 refills | Status: DC

## 2020-06-01 NOTE — Addendum Note (Signed)
Encounter addended by: Juluis Mire, RN on: 06/01/2020 3:35 PM  Actions taken: Pharmacy for encounter modified, Order list changed

## 2020-06-01 NOTE — Progress Notes (Signed)
Patient ID: Ronnie Black, male   DOB: August 17, 1945, 75 y.o.   MRN: [id number]     Primary Care Physician: [name], MD Referring Physician: Dr. Ron Parker EP: Dr. Hilliard Clark is a 75 y.o. male with a h/o afib, frequent cardioversions in  the past. He has had afib/flutter ablation in 2012  and afib ablation fall of 2018. He was on norpace at one time, changed to amiodarone which was stopped shortly after last ablation, then back to Norpace. Unfortunately, he continued to have symptomatic breakthrough episodes of afib and the decision was made to stop Norpace and load on dofetilide.   On follow up today, patient presents for dofetilide admission. He states he stopped Norpace 5 days ago and has not missed any doses of anticoagulation in the last 3 weeks. He is in SR today.    F/u in afib clinic, 06/01/20. He is one week s/p tikosyn load. He did well maintaining SR in the hospital and  was d/c on 500 mcg dofetilide  bid with stable qtc. He feels well and has been staying  in rhythm.   Today, he denies symptoms of  palpitations, chest pain, shortness of breath, orthopnea, PND, lower extremity edema, dizziness, presyncope, syncope, or neurologic sequela.  .The patient is tolerating medications without difficulties and is otherwise without complaint today.   Past Medical History:  Diagnosis Date  . Current use of long term anticoagulation 11/06/2018  . DDD (degenerative disc disease)   . HLD (hyperlipidemia)   . Hypertension   . 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 Persistent atrial fibrillation ([place])    s/p PVI 3/12   Past Surgical History:  Procedure Laterality Date  . APPENDECTOMY    . atrial fibrillation ablation  03/27/10   PVI with CTI ablation by JA  . ATRIAL FIBRILLATION ABLATION N/A 11/21/2016   Procedure: ATRIAL FIBRILLATION ABLATION;  Surgeon: [name], MD;  Location: [facility];  Service: Cardiovascular;  Laterality: N/A;  .  CARDIOVERSION    . CARDIOVERSION  04/26/2011   Procedure: CARDIOVERSION;  Surgeon: Larey Dresser, MD;  Location: [facility];  Service: Cardiovascular;  Laterality: N/A;  . CARDIOVERSION N/A 04/28/2012   Procedure: CARDIOVERSION;  Surgeon: Thayer Headings, MD;  Location: [facility];  Service: Cardiovascular;  Laterality: N/A;  . CARDIOVERSION N/A 07/28/2012   Procedure: CARDIOVERSION;  Surgeon: Lelon Perla, MD;  Location: [facility] ENDOSCOPY;  Service: Cardiovascular;  Laterality: N/A;  . CARDIOVERSION N/A 08/14/2012   Procedure: CARDIOVERSION;  Surgeon: Josue Hector, MD;  Location: [place];  Service: Cardiovascular;  Laterality: N/A;  . CARDIOVERSION N/A 08/11/2014   Procedure: CARDIOVERSION;  Surgeon: Fay Records, MD;  Location: [place];  Service: Cardiovascular;  Laterality: N/A;  . CARDIOVERSION N/A 08/26/2014   Procedure: CARDIOVERSION;  Surgeon: Sanda Klein, MD;  Location: [place];  Service: Cardiovascular;  Laterality: N/A;  . CARDIOVERSION N/A 10/10/2016   Procedure: CARDIOVERSION;  Surgeon: Larey Dresser, MD;  Location: [facility] ENDOSCOPY;  Service: Cardiovascular;  Laterality: N/A;  . CARDIOVERSION N/A 12/04/2016   Procedure: CARDIOVERSION;  Surgeon: Larey Dresser, MD;  Location: [facility] ENDOSCOPY;  Service: Cardiovascular;  Laterality: N/A;  . CARDIOVERSION N/A 06/09/2018   Procedure: CARDIOVERSION;  Surgeon: Dorothy Spark, MD;  Location: [facility] ENDOSCOPY;  Service: Cardiovascular;  Laterality: N/A;  . CARDIOVERSION N/A 08/17/2019   Procedure: CARDIOVERSION;  Surgeon: [name], MD;  Location: [place];  Service: Cardiovascular;  Laterality: N/A;  . CARDIOVERSION N/A 11/18/2019   Procedure: CARDIOVERSION;  Surgeon: [name], MD;  Location: [facility] ENDOSCOPY;  Service: Cardiovascular;  Laterality: N/A;  . CARDIOVERSION N/A 03/09/2020   Procedure: CARDIOVERSION;  Surgeon: [name], MD;  Location: [facility] ENDOSCOPY;  Service: Cardiovascular;  Laterality: N/A;   . SKIN CANCER EXCISION    . TEE WITHOUT CARDIOVERSION  04/26/2011   Procedure: TRANSESOPHAGEAL ECHOCARDIOGRAM (TEE);  Surgeon: Larey Dresser, MD;  Location: [facility] ENDOSCOPY;  Service: Cardiovascular;  Laterality: N/A;  to be done at 1330  . TEE WITHOUT CARDIOVERSION N/A 04/28/2012   Procedure: TRANSESOPHAGEAL ECHOCARDIOGRAM (TEE);  Surgeon: Thayer Headings, MD;  Location: [place];  Service: Cardiovascular;  Laterality: N/A;  . WISDOM TOOTH EXTRACTION      Current Outpatient Medications  Medication Sig Dispense Refill  . atorvastatin (LIPITOR) 10 MG tablet TAKE 1 TABLET BY MOUTH EVERY DAY 90 tablet 3  . dofetilide (TIKOSYN) 500 MCG capsule Take 1 capsule (500 mcg total) by mouth 2 (two) times daily. 60 capsule 6  . ELIQUIS 5 MG TABS tablet TAKE 1 TABLET BY MOUTH TWICE A DAY 180 tablet 1  . melatonin 5 MG TABS Take 5 mg by mouth at bedtime.    . metoprolol tartrate (LOPRESSOR) 25 MG tablet TAKE 1 TABLET BY MOUTH TWICE A DAY 180 tablet 2  . Multiple Vitamin (MULTIVITAMIN WITH MINERALS) TABS tablet Take 1 tablet by mouth 3 (three) times a week.    . sildenafil (VIAGRA) 25 MG tablet Take 25 mg by mouth as needed for erectile dysfunction.     . sodium chloride (OCEAN) 0.65 % SOLN nasal spray Place 1 spray into both nostrils as needed (dryness).     No current facility-administered medications for this encounter.    No Known Allergies  Social History   Socioeconomic History  . Marital status: Married    Spouse name: Not on file  . Number of children: 2  . Years of education: Not on file  . Highest education level: Not on file  Occupational History  . Occupation: professor    Employer: [employer]  Tobacco Use  . Smoking status: Former Smoker    Packs/day: 1.00    Years: 15.00    Pack years: 15.00    Types: Cigarettes    Quit date: 11/28/1975    Years since quitting: 44.5  . Smokeless tobacco: Never Used  Vaping Use  . Vaping Use: Never used  Substance and Sexual Activity   . Alcohol use: Yes    Alcohol/week: 6.0 standard drinks    Types: 3 Glasses of wine, 3 Cans of beer per week  . Drug use: No  . Sexual activity: Yes  Other Topics Concern  . Not on file  Social History Narrative    a professor of Therapist, occupational at [employer] in [place] @ [place] Strain: Not on [employer] Insecurity: Not on file  Transportation Needs: Not on file  Physical Activity: Not on file  Stress: Not on file  Social Connections: Not on file  Intimate Partner Violence: Not on file    Family History  Problem Relation Age of Onset  . Coronary artery disease Other   . Colon cancer Mother   . Lung cancer Mother   . Colon cancer Father   . Early death Neg Hx   . Hearing loss Neg Hx   .  Heart disease Neg Hx   . Hyperlipidemia Neg Hx   . Hypertension Neg Hx   . Kidney disease Neg Hx   . Stroke Neg Hx     ROS- All systems are reviewed and negative except as per the HPI above  Physical Exam: Vitals:   06/01/20 1406  BP: 138/80  Pulse: 76  Weight: 72.1 kg  Height: 5\' 6"  (1.676 m)    GEN- The patient is a well appearing elderly male, alert and oriented x 3 today.   HEENT-head normocephalic, atraumatic, sclera clear, conjunctiva pink, hearing intact, trachea midline. Lungs- Clear to ausculation bilaterally, normal work of breathing Heart- Regular rate and rhythm, no murmurs, rubs or gallops  GI- soft, NT, ND, + BS Extremities- no clubbing, cyanosis, or edema MS- no significant deformity or atrophy Skin- no rash or lesion Psych- euthymic mood, full affect Neuro- strength and sensation are intact   EKG-  SR Vent. rate 76 BPM PR interval 192  ms QRS duration 102  ms QT/QTc- 430/483 ms   Echo 11/23/19 1. The apical views are concerning for apical LV hypertrophy. . Left  ventricular ejection fraction, by estimation, is 60 to 65%. The left  ventricle has normal function.  The left ventricle has no regional wall  motion abnormalities. 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 Left ventricular diastolic parameters are  indeterminate.  2. Right ventricular systolic function is normal. The right ventricular  size is normal.  3. The mitral valve is normal in structure. Mild mitral valve  regurgitation. No evidence of mitral stenosis.  4. The aortic valve is normal in structure. Aortic valve regurgitation is  not visualized. No aortic stenosis is present.    Assessment and Plan:  1. Persistent atrial fibrillation/atrial flutter  S/p ablation 03/2010 and 11/21/16 Patient presents for dofetilide loading hospitalization  Continue Eliquis 5 mg BID  Reminded no  benadryl use chadsvasc score of 3  Continue metoprolol tartrate  25 mg bid     2. HCM Nonobstructive     f/u with Oda Kilts, PA as scheduled in one month    [facility] [address] [place], [place] [address] [phone]

## 2020-06-06 ENCOUNTER — Encounter (HOSPITAL_COMMUNITY): Payer: Self-pay

## 2020-06-07 ENCOUNTER — Other Ambulatory Visit (HOSPITAL_COMMUNITY): Payer: Self-pay | Admitting: *Deleted

## 2020-06-07 DIAGNOSIS — I4819 Other persistent atrial fibrillation: Secondary | ICD-10-CM

## 2020-06-08 ENCOUNTER — Other Ambulatory Visit (HOSPITAL_BASED_OUTPATIENT_CLINIC_OR_DEPARTMENT_OTHER)
Admission: RE | Admit: 2020-06-08 | Discharge: 2020-06-08 | Disposition: A | Discharge status: Home / Self Care | Payer: Medicare PPO | Source: Ambulatory Visit | Attending: Nurse Practitioner | Admitting: Nurse Practitioner

## 2020-06-08 ENCOUNTER — Other Ambulatory Visit: Payer: Self-pay

## 2020-06-08 DIAGNOSIS — I4819 Other persistent atrial fibrillation: Secondary | ICD-10-CM | POA: Diagnosis not present

## 2020-06-08 LAB — BASIC METABOLIC PANEL
Anion gap: 11 (ref 5–15)
BUN: 15 mg/dL (ref 8–23)
CO2: 26 mmol/L (ref 22–32)
Calcium: 9.1 mg/dL (ref 8.9–10.3)
Chloride: 102 mmol/L (ref 98–111)
Creatinine, Ser: 0.93 mg/dL (ref 0.61–1.24)
GFR, Estimated: >60 mL/min (ref >60)
Glucose, Bld: 92 mg/dL (ref 70–99)
Potassium: 4.2 mmol/L (ref 3.5–5.1)
Sodium: 139 mmol/L (ref 135–145)

## 2020-06-09 ENCOUNTER — Ambulatory Visit (INDEPENDENT_AMBULATORY_CARE_PROVIDER_SITE_OTHER): Payer: Medicare PPO

## 2020-06-09 DIAGNOSIS — Z0000 Encounter for general adult medical examination without abnormal findings: Secondary | ICD-10-CM | POA: Diagnosis not present

## 2020-06-09 NOTE — Patient Instructions (Addendum)
Ronnie Black , Thank you for taking time to come for your Medicare Wellness Visit. I appreciate your ongoing commitment to your health goals. Please review the following plan we discussed and let me know if I can assist you in the future.   Screening recommendations/referrals: Colonoscopy: Done 05/06/17 Recommended yearly ophthalmology/optometry visit for glaucoma screening and checkup Recommended yearly dental visit for hygiene and checkup  Vaccinations: Influenza vaccine: Up to date Pneumococcal vaccine: Up to date Tdap vaccine: Up to date Shingles vaccine: Completed 12/04/18 &  06/03/19   Covid-19: Completed 2/10, 3/3, & 12/07/19  Advanced directives: Please bring a copy of your health care power of attorney and living will to the office at your convenience.  Conditions/risks identified: None at this time  Next appointment: Follow up in one year for your annual wellness visit.   Preventive Care 77 Years and Older, Male Preventive care refers to lifestyle choices and visits with your health care provider that can promote health and wellness. What does preventive care include?  A yearly physical exam. This is also called an annual well check.  Dental exams once or twice a year.  Routine eye exams. Ask your health care provider how often you should have your eyes checked.  Personal lifestyle choices, including:  Daily care of your teeth and gums.  Regular physical activity.  Eating a healthy diet.  Avoiding tobacco and drug use.  Limiting alcohol use.  Practicing safe sex.  Taking low doses of aspirin every day.  Taking vitamin and mineral supplements as recommended by your health care provider. What happens during an annual well check? The services and screenings done by your health care provider during your annual well check will depend on your age, overall health, lifestyle risk factors, and family history of disease. Counseling  Your health care provider may ask you  questions about your:  Alcohol use.  Tobacco use.  Drug use.  Emotional well-being.  Home and relationship well-being.  Sexual activity.  Eating habits.  History of falls.  Memory and ability to understand (cognition).  Work and work Statistician. Screening  You may have the following tests or measurements:  Height, weight, and BMI.  Blood pressure.  Lipid and cholesterol levels. These may be checked every 5 years, or more frequently if you are over 61 years old.  Skin check.  Lung cancer screening. You may have this screening every year starting at age 17 if you have a 30-pack-year history of smoking and currently smoke or have quit within the past 15 years.  Fecal occult blood test (FOBT) of the stool. You may have this test every year starting at age 42.  Flexible sigmoidoscopy or colonoscopy. You may have a sigmoidoscopy every 5 years or a colonoscopy every 10 years starting at age 44.  Prostate cancer screening. Recommendations will vary depending on your family history and other risks.  Hepatitis C blood test.  Hepatitis B blood test.  Sexually transmitted disease (STD) testing.  Diabetes screening. This is done by checking your blood sugar (glucose) after you have not eaten for a while (fasting). You may have this done every 1-3 years.  Abdominal aortic aneurysm (AAA) screening. You may need this if you are a current or former smoker.  Osteoporosis. You may be screened starting at age 2 if you are at high risk. Talk with your health care provider about your test results, treatment options, and if necessary, the need for more tests. Vaccines  Your health care provider may  recommend certain vaccines, such as:  Influenza vaccine. This is recommended every year.  Tetanus, diphtheria, and acellular pertussis (Tdap, Td) vaccine. You may need a Td booster every 10 years.  Zoster vaccine. You may need this after age 26.  Pneumococcal 13-valent conjugate  (PCV13) vaccine. One dose is recommended after age 59.  Pneumococcal polysaccharide (PPSV23) vaccine. One dose is recommended after age 61. Talk to your health care provider about which screenings and vaccines you need and how often you need them. This information is not intended to replace advice given to you by your health care provider. Make sure you discuss any questions you have with your health care provider. Document Released: 02/03/2015 Document Revised: 09/27/2015 Document Reviewed: 11/08/2014 Elsevier Interactive Patient Education  2017 [place] Prevention in the Home Falls can cause injuries. They can happen to people of all ages. There are many things you can do to make your home safe and to help prevent falls. What can I do on the outside of my home?  Regularly fix the edges of walkways and driveways and fix any cracks.  Remove anything that might make you trip as you walk through a door, such as a raised step or threshold.  Trim any bushes or trees on the path to your home.  Use bright outdoor lighting.  Clear any walking paths of anything that might make someone trip, such as rocks or tools.  Regularly check to see if handrails are loose or broken. Make sure that both sides of any steps have handrails.  Any raised decks and porches should have guardrails on the edges.  Have any leaves, snow, or ice cleared regularly.  Use sand or salt on walking paths during winter.  Clean up any spills in your garage right away. This includes oil or grease spills. What can I do in the bathroom?  Use night lights.  Install grab bars by the toilet and in the tub and shower. Do not use towel bars as grab bars.  Use non-skid mats or decals in the tub or shower.  If you need to sit down in the shower, use a plastic, non-slip stool.  Keep the floor dry. Clean up any water that spills on the floor as soon as it happens.  Remove soap buildup in the tub or shower  regularly.  Attach bath mats securely with double-sided non-slip rug tape.  Do not have throw rugs and other things on the floor that can make you trip. What can I do in the bedroom?  Use night lights.  Make sure that you have a light by your bed that is easy to reach.  Do not use any sheets or blankets that are too big for your bed. They should not hang down onto the floor.  Have a firm chair that has side arms. You can use this for support while you get dressed.  Do not have throw rugs and other things on the floor that can make you trip. What can I do in the kitchen?  Clean up any spills right away.  Avoid walking on wet floors.  Keep items that you use a lot in easy-to-reach places.  If you need to reach something above you, use a strong step stool that has a grab bar.  Keep electrical cords out of the way.  Do not use floor polish or wax that makes floors slippery. If you must use wax, use non-skid floor wax.  Do not have throw rugs and  other things on the floor that can make you trip. What can I do with my stairs?  Do not leave any items on the stairs.  Make sure that there are handrails on both sides of the stairs and use them. Fix handrails that are broken or loose. Make sure that handrails are as long as the stairways.  Check any carpeting to make sure that it is firmly attached to the stairs. Fix any carpet that is loose or worn.  Avoid having throw rugs at the top or bottom of the stairs. If you do have throw rugs, attach them to the floor with carpet tape.  Make sure that you have a light switch at the top of the stairs and the bottom of the stairs. If you do not have them, ask someone to add them for you. What else can I do to help prevent falls?  Wear shoes that:  Do not have high heels.  Have rubber bottoms.  Are comfortable and fit you well.  Are closed at the toe. Do not wear sandals.  If you use a stepladder:  Make sure that it is fully  opened. Do not climb a closed stepladder.  Make sure that both sides of the stepladder are locked into place.  Ask someone to hold it for you, if possible.  Clearly mark and make sure that you can see:  Any grab bars or handrails.  First and last steps.  Where the edge of each step is.  Use tools that help you move around (mobility aids) if they are needed. These include:  Canes.  Walkers.  Scooters.  Crutches.  Turn on the lights when you go into a dark area. Replace any light bulbs as soon as they burn out.  Set up your furniture so you have a clear path. Avoid moving your furniture around.  If any of your floors are uneven, fix them.  If there are any pets around you, be aware of where they are.  Review your medicines with your doctor. Some medicines can make you feel dizzy. This can increase your chance of falling. Ask your doctor what other things that you can do to help prevent falls. This information is not intended to replace advice given to you by your health care provider. Make sure you discuss any questions you have with your health care provider. Document Released: 11/03/2008 Document Revised: 06/15/2015 Document Reviewed: 02/11/2014 Elsevier Interactive Patient Education  2017 [employer].

## 2020-06-09 NOTE — Progress Notes (Signed)
Virtual Visit via Telephone Note  I connected with  Ronnie Black on 06/09/20 at  3:15 PM EDT by telephone and verified that I am speaking with the correct person using two identifiers.  Medicare Annual Wellness visit completed telephonically due to Covid-19 pandemic.   Persons participating in this call: This Health Coach and this patient.   Location: Patient: Home Provider: Office   I discussed the limitations, risks, security and privacy concerns of performing an evaluation and management service by telephone and the availability of in person appointments. The patient expressed understanding and agreed to proceed.  Unable to perform video visit due to video visit attempted and failed and/or patient does not have video capability.   Some vital signs may be absent or patient reported.   Willette Brace, LPN    Subjective:   Ronnie Black is a 75 y.o. male who presents for Medicare Annual/Subsequent preventive examination.  Review of Systems     Cardiac Risk Factors include: advanced age (>19men, >26 women);hypertension;dyslipidemia;male gender     Objective:    There were no vitals filed for this visit. There is no height or weight on file to calculate BMI.  Advanced Directives 06/09/2020 05/23/2020 03/09/2020 11/18/2019 08/17/2019 04/15/2019 06/09/2018  Does Patient Have a Medical Advance Directive? - Yes Yes Yes Yes Yes Yes  Type of Paramedic of [place];Living will [place];Living will [place];Living will - Living will;Healthcare Power of [place];Living will  Does patient want to make changes to medical advance directive? - No - Patient declined - - - No - Patient declined -  Copy of [place] in Chart? No - copy requested No - copy requested No - copy requested No - copy requested - No - copy requested No - copy requested  Would  patient like information on creating a medical advance directive? - - - - - - -  Pre-existing out of facility DNR order (yellow form or pink MOST form) - - - - - - -    Current Medications (verified) Outpatient Encounter Medications as of 06/09/2020  Medication Sig  . atorvastatin (LIPITOR) 10 MG tablet TAKE 1 TABLET BY MOUTH EVERY DAY  . dofetilide (TIKOSYN) 500 MCG capsule Take 1 capsule (500 mcg total) by mouth 2 (two) times daily.  Marland Kitchen ELIQUIS 5 MG TABS tablet TAKE 1 TABLET BY MOUTH TWICE A DAY  . melatonin 5 MG TABS Take 5 mg by mouth at bedtime.  . metoprolol tartrate (LOPRESSOR) 25 MG tablet TAKE 1 TABLET BY MOUTH TWICE A DAY  . Multiple Vitamin (MULTIVITAMIN WITH MINERALS) TABS tablet Take 1 tablet by mouth 3 (three) times a week.  . sodium chloride (OCEAN) 0.65 % SOLN nasal spray Place 1 spray into both nostrils as needed (dryness). (Patient not taking: Reported on 06/09/2020)  . [DISCONTINUED] sildenafil (VIAGRA) 25 MG tablet Take 25 mg by mouth as needed for erectile dysfunction.    No facility-administered encounter medications on file as of 06/09/2020.    Allergies (verified) Patient has no known allergies.   History: Past Medical History:  Diagnosis Date  . Current use of long term anticoagulation 11/06/2018  . DDD (degenerative disc disease)   . HLD (hyperlipidemia)   . Hypertension   . 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 Persistent atrial fibrillation ([place])    s/p PVI 3/12   Past Surgical History:  Procedure Laterality Date  . APPENDECTOMY    . atrial fibrillation ablation  03/27/10   PVI with CTI ablation by [facility]  . ATRIAL FIBRILLATION ABLATION N/A 11/21/2016   Procedure: ATRIAL FIBRILLATION ABLATION;  Surgeon: [name], MD;  Location: [place] [facility];  Service: Cardiovascular;  Laterality: N/A;  . CARDIOVERSION    . CARDIOVERSION  04/26/2011   Procedure: CARDIOVERSION;  Surgeon: Larey Dresser, MD;  Location: [place];  Service:  Cardiovascular;  Laterality: N/A;  . CARDIOVERSION N/A 04/28/2012   Procedure: CARDIOVERSION;  Surgeon: Thayer Headings, MD;  Location: [place];  Service: Cardiovascular;  Laterality: N/A;  . CARDIOVERSION N/A 07/28/2012   Procedure: CARDIOVERSION;  Surgeon: Lelon Perla, MD;  Location: [facility] ENDOSCOPY;  Service: Cardiovascular;  Laterality: N/A;  . CARDIOVERSION N/A 08/14/2012   Procedure: CARDIOVERSION;  Surgeon: Josue Hector, MD;  Location: [place];  Service: Cardiovascular;  Laterality: N/A;  . CARDIOVERSION N/A 08/11/2014   Procedure: CARDIOVERSION;  Surgeon: Fay Records, MD;  Location: [place];  Service: Cardiovascular;  Laterality: N/A;  . CARDIOVERSION N/A 08/26/2014   Procedure: CARDIOVERSION;  Surgeon: Sanda Klein, MD;  Location: [place];  Service: Cardiovascular;  Laterality: N/A;  . CARDIOVERSION N/A 10/10/2016   Procedure: CARDIOVERSION;  Surgeon: Larey Dresser, MD;  Location: [facility] ENDOSCOPY;  Service: Cardiovascular;  Laterality: N/A;  . CARDIOVERSION N/A 12/04/2016   Procedure: CARDIOVERSION;  Surgeon: Larey Dresser, MD;  Location: [place];  Service: Cardiovascular;  Laterality: N/A;  . CARDIOVERSION N/A 06/09/2018   Procedure: CARDIOVERSION;  Surgeon: Dorothy Spark, MD;  Location: [facility] ENDOSCOPY;  Service: Cardiovascular;  Laterality: N/A;  . CARDIOVERSION N/A 08/17/2019   Procedure: CARDIOVERSION;  Surgeon: [name], MD;  Location: [facility] ENDOSCOPY;  Service: Cardiovascular;  Laterality: N/A;  . CARDIOVERSION N/A 11/18/2019   Procedure: CARDIOVERSION;  Surgeon: [name], MD;  Location: [place];  Service: Cardiovascular;  Laterality: N/A;  . CARDIOVERSION N/A 03/09/2020   Procedure: CARDIOVERSION;  Surgeon: [name], MD;  Location: [place];  Service: Cardiovascular;  Laterality: N/A;  . SKIN CANCER EXCISION    . TEE WITHOUT CARDIOVERSION  04/26/2011   Procedure: TRANSESOPHAGEAL ECHOCARDIOGRAM (TEE);  Surgeon: Larey Dresser, MD;  Location: [facility] ENDOSCOPY;  Service: Cardiovascular;  Laterality: N/A;  to be done at 1330  . TEE WITHOUT CARDIOVERSION N/A 04/28/2012   Procedure: TRANSESOPHAGEAL ECHOCARDIOGRAM (TEE);  Surgeon: Thayer Headings, MD;  Location: [facility] ENDOSCOPY;  Service: Cardiovascular;  Laterality: N/A;  . WISDOM TOOTH EXTRACTION     Family History  Problem Relation Age of Onset  . Coronary artery disease Other   . Colon cancer Mother   . Lung cancer Mother   . Colon cancer Father   . Early death Neg Hx   . Hearing loss Neg Hx   . Heart disease Neg Hx   . Hyperlipidemia Neg Hx   . Hypertension Neg Hx   . Kidney disease Neg Hx   . Stroke Neg Hx    Social History   Socioeconomic History  . Marital status: Married    Spouse name: Not on file  . Number of children: 2  . Years of education: Not on file  . Highest education level: Not on file  Occupational History  . Occupation: professor    Employer: [employer]    Comment: retired   Tobacco Use  . Smoking status: Former Smoker    Packs/day: 1.00    Years: 15.00  Pack years: 15.00    Types: Cigarettes    Quit date: 11/28/1975    Years since quitting: 44.5  . Smokeless tobacco: Never Used  Vaping Use  . Vaping Use: Never used  Substance and Sexual Activity  . Alcohol use: Yes    Alcohol/week: 6.0 standard drinks    Types: 3 Glasses of wine, 3 Cans of beer per week  . Drug use: No  . Sexual activity: Yes  Other Topics Concern  . 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 Difficulty of Paying Living Expenses: Not hard at all  Food Insecurity: No Food Insecurity  . Worried About Charity fundraiser in the Last Year: Never true  . Ran Out of Food in the Last Year: Never true  Transportation Needs: No Transportation Needs  . Lack of Transportation  (Medical): No  . Lack of Transportation (Non-Medical): No  Physical Activity: Sufficiently Active  . Days of Exercise per Week: 6 days  . Minutes of Exercise per Session: 60 min  Stress: No Stress Concern Present  . Feeling of Stress : Not at all  Social Connections: Moderately Isolated  . Frequency of Communication with Friends and Family: Twice a week  . Frequency of Social Gatherings with Friends and Family: Three times a week  . Attends Religious Services: Never  . Active Member of Clubs or Organizations: No  . Attends Archivist Meetings: Never  . Marital Status: Married    Tobacco Counseling Counseling given: Not Answered   Clinical Intake:  Pre-visit preparation completed: Yes  Pain : No/denies pain     BMI - recorded: 25.68 Nutritional Risks: None Diabetes: No  How often do you need to have someone help you when you read instructions, pamphlets, or other written materials from your doctor or pharmacy?: 1 - Never  Diabetic?No  Interpreter Needed?: No  Information entered by :: Charlott Rakes, LPN   Activities of Daily Living In your present state of health, do you have any difficulty performing the following activities: 06/09/2020 05/23/2020  Hearing? N N  Vision? N N  Difficulty concentrating or making decisions? N N  Walking or climbing stairs? N N  Dressing or bathing? N N  Doing errands, shopping? N N  Preparing Food and eating ? N -  Using the Toilet? N -  Managing your Medications? N -  Managing your Finances? N -  Housekeeping or managing your Housekeeping? N -  Some recent data might be hidden    Patient Care Team: [name], MD as PCP - General (Family Medicine) [name], MD (Cardiology) [place], [facility] as Consulting Physician (Urology) Laurin Coder, MD as Consulting Physician (Pulmonary Disease) Richmond Campbell, MD as Consulting Physician (Gastroenterology)  Indicate any recent Medical Services  you may have received from other than [facility] providers in the past year (date may be approximate).     Assessment:   This is a routine wellness examination for [place].  Hearing/Vision screen  Hearing Screening   125Hz  250Hz  500Hz  1000Hz  2000Hz  3000Hz  4000Hz  6000Hz  8000Hz   Right ear:           Left ear:           Comments: Pt denies any hearing issues   Vision Screening Comments: Pt follows  up with Dr [name] for annual eye exams   Dietary issues and exercise activities discussed: Current Exercise Habits: Home exercise routine, Type of exercise: stretching;walking;Other - see comments;strength training/weights (pilates)  Goals Addressed            This Visit's Progress   . Patient Stated       None at this time       Depression Screen PHQ 2/9 Scores 06/09/2020 12/01/2019 04/15/2019 11/06/2018 11/16/2017 11/16/2017 11/13/2017  PHQ - 2 Score 0 0 0 0 0 0 0  PHQ- 9 Score - - - - - - 0    Fall Risk Fall Risk  06/09/2020 03/13/2020 12/01/2019 04/15/2019 11/06/2018  Falls in the past year? 0 0 0 0 0  Number falls in past yr: 0 0 0 0 0  Injury with Fall? 0 0 0 0 0  Risk for fall due to : Impaired vision - - - -  Follow up Falls prevention discussed - - Falls evaluation completed;Education provided;Falls prevention discussed Falls evaluation completed    FALL RISK PREVENTION PERTAINING TO THE HOME:  Any stairs in or around the home? No  If so, are there any without handrails? No  Home free of loose throw rugs in walkways, pet beds, electrical cords, etc? Yes  Adequate lighting in your home to reduce risk of falls? Yes   ASSISTIVE DEVICES UTILIZED TO PREVENT FALLS:  Life alert? No   Have pull cords for assistance  Use of a cane, walker or w/c? No Grab bars in the bathroom? Yes  Shower chair or bench in shower? Yes  Elevated toilet seat or a handicapped toilet? No   TIMED UP AND GO:  Was the test performed? No .    Cognitive Function:     6CIT Screen 06/09/2020  04/15/2019  What Year? 0 points 0 points  What month? 0 points 0 points  What time? - 0 points  Count back from 20 0 points 0 points  Months in reverse 0 points 0 points  Repeat phrase 0 points 0 points  Total Score - 0    Immunizations Immunization History  Administered Date(s) Administered  . Fluad Quad(high Dose 65+) 11/02/2019  . Influenza, High Dose Seasonal PF 11/02/2015, 11/07/2016, 11/13/2017  . Influenza, Seasonal, Injecte, Preservative Fre 11/04/2013  . Moderna Sars-Covid-2 Vaccination 03/03/2019, 03/24/2019, 12/07/2019  . Pneumococcal Conjugate-13 12/02/2013  . Pneumococcal Polysaccharide-23 01/07/2012  . Tdap 11/07/2016  . Zoster 01/22/2010  . Zoster Recombinat (Shingrix) 12/04/2018, 06/03/2019    TDAP status: Up to date  Flu Vaccine status: Up to date  Pneumococcal vaccine status: Up to date  Covid-19 vaccine status: Completed vaccines  Qualifies for Shingles Vaccine? Yes   Zostavax completed Yes   Shingrix Completed?: Yes  Screening Tests Health Maintenance  Topic Date Due  . COVID-19 Vaccine (4 - Booster) 03/08/2020  . INFLUENZA VACCINE  08/21/2020  . COLONOSCOPY (Pts 45-75yrs Insurance coverage will need to be confirmed)  05/07/2022  . TETANUS/TDAP  11/08/2026  . Hepatitis C Screening  Completed  . PNA vac Low Risk Adult  Completed  . HPV VACCINES  Aged Out    Health Maintenance  Health Maintenance Due  Topic Date Due  . COVID-19 Vaccine (4 - Booster) 03/08/2020    Colorectal cancer screening: Type of screening: Colonoscopy. Completed 05/06/17. Repeat every 5 years   Additional Screening:  Hepatitis C Screening:  Completed 11/07/16  Vision Screening: Recommended annual ophthalmology exams for early detection of glaucoma and other disorders  of the eye. Is the patient up to date with their annual eye exam?  Yes  Who is the provider or what is the name of the office in which the patient attends annual eye exams? Dr [name]  If pt is not  established with a provider, would they like to be referred to a provider to establish care? No .   Dental Screening: Recommended annual dental exams for proper oral hygiene  Community Resource Referral / Chronic Care Management: CRR required this visit?  No   CCM required this visit?  No      Plan:     I have personally reviewed and noted the following in the patient's chart:   . Medical and social history . Use of alcohol, tobacco or illicit drugs  . Current medications and supplements including opioid prescriptions. Patient is not currently taking opioid prescriptions. . Functional ability and status . Nutritional status . Physical activity . Advanced directives . List of other physicians . Hospitalizations, surgeries, and ER visits in previous 12 months . Vitals . Screenings to include cognitive, depression, and falls . Referrals and appointments  In addition, I have reviewed and discussed with patient certain preventive protocols, quality metrics, and best practice recommendations. A written personalized care plan for preventive services as well as general preventive health recommendations were provided to patient.     Willette Brace, LPN   [id number]   Nurse Notes: None

## 2020-06-11 ENCOUNTER — Encounter: Payer: Self-pay | Admitting: Family Medicine

## 2020-06-27 DIAGNOSIS — Z85828 Personal history of other malignant neoplasm of skin: Secondary | ICD-10-CM | POA: Diagnosis not present

## 2020-06-27 DIAGNOSIS — L738 Other specified follicular disorders: Secondary | ICD-10-CM | POA: Diagnosis not present

## 2020-06-29 ENCOUNTER — Other Ambulatory Visit: Payer: Self-pay

## 2020-06-29 ENCOUNTER — Other Ambulatory Visit (HOSPITAL_BASED_OUTPATIENT_CLINIC_OR_DEPARTMENT_OTHER): Payer: Self-pay

## 2020-06-29 ENCOUNTER — Ambulatory Visit: Payer: Medicare PPO | Attending: Internal Medicine

## 2020-06-29 DIAGNOSIS — Z23 Encounter for immunization: Secondary | ICD-10-CM

## 2020-06-29 MED ORDER — COVID-19 MRNA VACC (MODERNA) 100 MCG/0.5ML IM SUSP
INTRAMUSCULAR | 0 refills | Status: DC
  Filled 2020-06-29: qty 0.25, 1d supply, fill #0

## 2020-06-29 NOTE — Progress Notes (Signed)
PCP:  [name], MD Primary Cardiologist: None Electrophysiologist: [name], MD   Ronnie Black is a 75 y.o. male seen today for [name], MD for routine electrophysiology followup.  Since  discharge from Tikosyn loading admission  the patient reports doing very well. He has not had any atrial fibrillation. He states prior to starting tikosyn he'd had about 3 breakthroughs in a 12 month span but was pretty symptomatic. Currently,  he denies chest pain, palpitations, dyspnea, PND, orthopnea, nausea, vomiting, dizziness, syncope, edema, weight gain, or early satiety.  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   Persistent atrial fibrillation ([place])    s/p PVI 3/12   Past Surgical History:  Procedure Laterality Date   APPENDECTOMY     atrial fibrillation ablation  03/27/10   PVI with CTI ablation by [place] N/A 11/21/2016   Procedure: ATRIAL FIBRILLATION ABLATION;  Surgeon: [name], MD;  Location: [place] [facility];  Service: Cardiovascular;  Laterality: N/A;   CARDIOVERSION     CARDIOVERSION  04/26/2011   Procedure: CARDIOVERSION;  Surgeon: Larey Dresser, MD;  Location: [place];  Service: Cardiovascular;  Laterality: N/A;   CARDIOVERSION N/A 04/28/2012   Procedure: CARDIOVERSION;  Surgeon: Thayer Headings, MD;  Location: [place];  Service: Cardiovascular;  Laterality: N/A;   CARDIOVERSION N/A 07/28/2012   Procedure: CARDIOVERSION;  Surgeon: Lelon Perla, MD;  Location: [facility] ENDOSCOPY;  Service: Cardiovascular;  Laterality: N/A;   CARDIOVERSION N/A 08/14/2012   Procedure: CARDIOVERSION;  Surgeon: Josue Hector, MD;  Location: [place];  Service: Cardiovascular;  Laterality: N/A;   CARDIOVERSION N/A 08/11/2014   Procedure: CARDIOVERSION;   Surgeon: Fay Records, MD;  Location: [place];  Service: Cardiovascular;  Laterality: N/A;   CARDIOVERSION N/A 08/26/2014   Procedure: CARDIOVERSION;  Surgeon: Sanda Klein, MD;  Location: [place];  Service: Cardiovascular;  Laterality: N/A;   CARDIOVERSION N/A 10/10/2016   Procedure: CARDIOVERSION;  Surgeon: Larey Dresser, MD;  Location: [place];  Service: Cardiovascular;  Laterality: N/A;   CARDIOVERSION N/A 12/04/2016   Procedure: CARDIOVERSION;  Surgeon: Larey Dresser, MD;  Location: [place];  Service: Cardiovascular;  Laterality: N/A;   CARDIOVERSION N/A 06/09/2018   Procedure: CARDIOVERSION;  Surgeon: Dorothy Spark, MD;  Location: [place];  Service: Cardiovascular;  Laterality: N/A;   CARDIOVERSION N/A 08/17/2019   Procedure: CARDIOVERSION;  Surgeon: [name], MD;  Location: [place];  Service: Cardiovascular;  Laterality: N/A;   CARDIOVERSION N/A 11/18/2019   Procedure: CARDIOVERSION;  Surgeon: [name], MD;  Location: [place];  Service: Cardiovascular;  Laterality: N/A;   CARDIOVERSION N/A 03/09/2020   Procedure: CARDIOVERSION;  Surgeon: [name], MD;  Location: [place];  Service: Cardiovascular;  Laterality: N/A;   SKIN CANCER EXCISION     TEE WITHOUT CARDIOVERSION  04/26/2011   Procedure: TRANSESOPHAGEAL ECHOCARDIOGRAM (TEE);  Surgeon: Larey Dresser, MD;  Location[facility] ENDOSCOPY;  Service: Cardiovascular;  Laterality: N/A;  to be done at 1330   TEE WITHOUT CARDIOVERSION N/A 04/28/2012   Procedure: TRANSESOPHAGEAL ECHOCARDIOGRAM (TEE);  Surgeon: Thayer Headings, MD;  Location: [place];  Service: Cardiovascular;  Laterality: N/A;   WISDOM TOOTH EXTRACTION      Current Outpatient Medications  Medication  Sig Dispense Refill   atorvastatin (LIPITOR) 10 MG tablet TAKE 1 TABLET BY MOUTH EVERY DAY 90 tablet 3   dofetilide (TIKOSYN) 500 MCG capsule Take 1 capsule (500 mcg total) by mouth 2 (two) times daily. 60  capsule 6   ELIQUIS 5 MG TABS tablet TAKE 1 TABLET BY MOUTH TWICE A DAY 180 tablet 1   melatonin 5 MG TABS Take 5 mg by mouth at bedtime.     metoprolol tartrate (LOPRESSOR) 25 MG tablet TAKE 1 TABLET BY MOUTH TWICE A DAY 180 tablet 2   Multiple Vitamin (MULTIVITAMIN WITH MINERALS) TABS tablet Take 1 tablet by mouth 3 (three) times a week.     sodium chloride (OCEAN) 0.65 % SOLN nasal spray Place 1 spray into both nostrils as needed (dryness).     No current facility-administered medications for this visit.    No Known Allergies  Social History   Socioeconomic History   Marital status: Married    Spouse name: Not on file   Number of children: 2   Years of education: Not on file   Highest education level: Not on file  Occupational History   Occupation: professor    Employer: [employer]    Comment: retired   Tobacco Use   Smoking status: Former    Packs/day: 1.00    Years: 15.00    Pack years: 15.00    Types: Cigarettes    Quit date: 11/28/1975    Years since quitting: 44.6   Smokeless tobacco: Never  Vaping Use   Vaping Use: Never used  Substance and Sexual Activity   Alcohol use: Yes    Alcohol/week: 6.0 standard drinks    Types: 3 Glasses of wine, 3 Cans of beer per week   Drug use: No   Sexual activity: Yes  Other Topics Concern   Not on file  Social History Narrative    a professor of Therapist, occupational at [employer] in [place] @ [employer]    Social Determinants of Radio broadcast assistant Strain: Low Risk    Difficulty of Paying Living Expenses: Not hard at all  Food Insecurity: No Food Insecurity   Worried About Charity fundraiser in the Last Year: Never true   Arboriculturist in the Last Year: Never true  Transportation Needs: No Transportation Needs   Lack of Transportation (Medical): No   Lack of Transportation (Non-Medical): No  Physical Activity: Sufficiently Active   Days of Exercise per Week: 6 days   Minutes of  Exercise per Session: 60 min  Stress: No Stress Concern Present   Feeling of Stress : Not at all  Social Connections: Moderately Isolated   Frequency of Communication with Friends and Family: Twice a week   Frequency of Social Gatherings with Friends and Family: Three times a week   Attends Religious Services: Never   Active Member of Clubs or Organizations: No   Attends Archivist Meetings: Never   Marital Status: Married  Human resources officer Violence: Not At Risk   Fear of Current or Ex-Partner: No   Emotionally Abused: No   Physically Abused: No   Sexually Abused: No     Review of Systems: General: No chills, fever, night sweats or weight changes  Cardiovascular:  No chest pain, dyspnea on exertion, edema, orthopnea, palpitations, paroxysmal nocturnal dyspnea Dermatological: No rash, lesions or masses Respiratory: No cough, dyspnea Urologic: No hematuria, dysuria Abdominal: No nausea, vomiting, diarrhea, bright red  blood per rectum, melena, or hematemesis Neurologic: No visual changes, weakness, changes in mental status All other systems reviewed and are otherwise negative except as noted above.  Physical Exam: Vitals:   06/30/20 1206  BP: (!) 148/82  Pulse: 64  SpO2: 98%  Weight: 155 lb 6.4 oz (70.5 kg)  Height: 5\' 6"  (1.676 m)    GEN- The patient is well appearing, alert and oriented x 3 today.   HEENT: normocephalic, atraumatic; sclera clear, conjunctiva pink; hearing intact; oropharynx clear; neck supple, no JVP Lymph- no cervical lymphadenopathy Lungs- Clear to ausculation bilaterally, normal work of breathing.  No wheezes, rales, rhonchi Heart- Regular rate and rhythm, no murmurs, rubs or gallops, PMI not laterally displaced GI- soft, non-tender, non-distended, bowel sounds present, no hepatosplenomegaly Extremities- no clubbing, cyanosis, or edema; DP/PT/radial pulses 2+ bilaterally MS- no significant deformity or atrophy Skin- warm and dry, no rash or  lesion Psych- euthymic mood, full affect Neuro- strength and sensation are intact  EKG is ordered. Personal review of EKG from today shows NSR at 64 bpm, QTc 476 ms  Additional studies reviewed include: Previous EP and AF clinic notes  Assessment and Plan:  1. Persistent atrial fibrillation EKG today shows NSR with stable QTc Continue tikosyn 500 mcg BID BMET/Mg today. Continue eliquis for CHA2DS2/VASC of at least 3.   Labs today and RTC 6 months for continued follow up.  Sooner with issues.    Shirley Friar, PA-C  06/30/20 12:11 PM

## 2020-06-29 NOTE — Progress Notes (Signed)
   Covid-19 Vaccination Clinic  Name:  RAY GERVASI    MRN: [id number] DOB: 09/25/1945  06/29/2020  Mr. Maclay was observed post Covid-19 immunization for 15 minutes without incident. He was provided with Vaccine Information Sheet and instruction to access the V-Safe system.   Mr. Caras was instructed to call 911 with any severe reactions post vaccine: Difficulty breathing  Swelling of face and throat  A fast heartbeat  A bad rash all over body  Dizziness and weakness   Immunizations Administered     Name Date Dose VIS Date Route   Moderna Covid-19 Booster Vaccine 06/29/2020  2:25 PM 0.25 mL 11/10/2019 Intramuscular   Manufacturer: Moderna   Lot: [id number]   [place]: [id number]

## 2020-06-30 ENCOUNTER — Ambulatory Visit (INDEPENDENT_AMBULATORY_CARE_PROVIDER_SITE_OTHER): Payer: Medicare PPO | Admitting: Student

## 2020-06-30 VITALS — BP 148/82 | HR 64 | Ht 66.0 in | Wt 155.4 lb

## 2020-06-30 DIAGNOSIS — I4819 Other persistent atrial fibrillation: Secondary | ICD-10-CM | POA: Diagnosis not present

## 2020-06-30 NOTE — Patient Instructions (Signed)
Medication Instructions:  Your physician recommends that you continue on your current medications as directed. Please refer to the Current Medication list given to you today.  *If you need a refill on your cardiac medications before your next appointment, please call your pharmacy*   Lab Work: TODAY: BMET, Mg  If you have labs (blood work) drawn today and your tests are completely normal, you will receive your results only by: [place] (if you have MyChart) OR A paper copy in the mail If you have any lab test that is abnormal or we need to change your treatment, we will call you to review the results.    Follow-Up: At [facility], you and your health needs are our priority.  As part of our continuing mission to provide you with exceptional heart care, we have created designated Provider Care Teams.  These Care Teams include your primary Cardiologist (physician) and Advanced Practice Providers (APPs -  Physician Assistants and Nurse Practitioners) who all work together to provide you with the care you need, when you need it.   Your next appointment:   6 month(s)  The format for your next appointment:   In Person  Provider:   You may see [name], MD or one of the following Advanced Practice Providers on your designated Care Team:   Chanetta Marshall, NP Tommye Standard, PA-C Legrand Como "Sayre" [place], [place]

## 2020-07-01 LAB — BASIC METABOLIC PANEL
BUN/Creatinine Ratio: 16 (ref 10–24)
BUN: 14 mg/dL (ref 8–27)
CO2: 24 mmol/L (ref 20–29)
Calcium: 9.6 mg/dL (ref 8.6–10.2)
Chloride: 98 mmol/L (ref 96–106)
Creatinine, Ser: 0.88 mg/dL (ref 0.76–1.27)
Glucose: 75 mg/dL (ref 65–99)
Potassium: 4.5 mmol/L (ref 3.5–5.2)
Sodium: 137 mmol/L (ref 134–144)
eGFR: 89 mL/min/{1.73_m2} (ref >59)

## 2020-07-01 LAB — MAGNESIUM: Magnesium: 2.2 mg/dL (ref 1.6–2.3)

## 2020-10-04 ENCOUNTER — Ambulatory Visit: Payer: Medicare PPO | Admitting: Family Medicine

## 2020-10-04 ENCOUNTER — Other Ambulatory Visit: Payer: Self-pay

## 2020-10-04 ENCOUNTER — Encounter: Payer: Self-pay | Admitting: Family Medicine

## 2020-10-04 VITALS — BP 136/83 | HR 68 | Temp 97.8°F | Ht 66.0 in | Wt 152.0 lb

## 2020-10-04 DIAGNOSIS — M76899 Other specified enthesopathies of unspecified lower limb, excluding foot: Secondary | ICD-10-CM | POA: Diagnosis not present

## 2020-10-04 DIAGNOSIS — I10 Essential (primary) hypertension: Secondary | ICD-10-CM | POA: Diagnosis not present

## 2020-10-04 DIAGNOSIS — Z23 Encounter for immunization: Secondary | ICD-10-CM

## 2020-10-04 DIAGNOSIS — I4891 Unspecified atrial fibrillation: Secondary | ICD-10-CM | POA: Diagnosis not present

## 2020-10-04 NOTE — Progress Notes (Signed)
   Ronnie Black is a 75 y.o. male who presents today for an office visit.  Assessment/Plan:  New/Acute Problems: Right Hip Pain He does have slightly decreased range of motion and likely has underlying osteoarthritis however his current pain seems to be more localized to hamstring insertion at the ischial tuberosity.  May have bursitis or tendinitis in the area.  He is not a candidate for NSAIDs or prednisone at this point due to being anticoagulated on Eliquis.  He wishes to avoid systemic steroids due to it potentially causing a flareup of his A. fib.  We will refer him to see a physical therapist.  If continues to be an issue beyond as needed follow-up with sports medicine.  Chronic Problems Addressed Today: Hypertension Medical metoprolol tartrate 25 mg twice daily  Atrial fibrillation Anticoagulated on Eliquis.  We will be avoiding NSAIDs and prednisone as above.    Subjective:  HPI:  CC of the patient is right hip pain, which has started this spring. Exact location seems to be in the middle of the lower part of right buttock. The severity of the pain does not vary, even when exercising. The quality of the condition does vary, as it occasionally feels stiff or like it will not support his weight/weakness. Because of these issues he expresses interest in finding out the cause.  He denies falls or injuries, or complications with his left leg. A  He states that he cannot take Prednisone due to his A Fib.         Objective:  Physical Exam: There were no vitals taken for this visit.  Gen: No acute distress, resting comfortably CV: Regular rate and rhythm with no murmurs appreciated Pulm: Normal work of breathing, clear to auscultation bilaterally with no crackles, wheezes, or rhonchi MSK: Bilateral lower extremities without deformity.  Decreased range of motion with internal and external rotation of right hip.  Slightly decreased strength and right hamstrings compared to left.   Slightly weak hip abductors on right.  Neurovascular intact distally. Neuro: Grossly normal, moves all extremities Psych: Normal affect and thought content      I,Jordan Kelly,acting as a scribe for Dimas Chyle, MD.,have documented all relevant documentation on the behalf of Dimas Chyle, MD,as directed by  Dimas Chyle, MD while in the presence of Dimas Chyle, MD.  I, Dimas Chyle, MD, have reviewed all documentation for this visit. The documentation on 10/04/20 for the exam, diagnosis, procedures, and orders are all accurate and complete.  [name]. [name], MD 10/04/2020 7:54 AM

## 2020-10-04 NOTE — Patient Instructions (Signed)
It was very nice to see you today!  I think you have inflammation near where your hamstring muscles attached to your pelvis.  We will set you up to see a physical therapist to work on this.  If you are not improving with physical therapy we will have you see a sports medicine doctor  Take care, Dr [name]  PLEASE NOTE:  If you had any lab tests please let us know if you have not heard back within a few days. You may see your results on mychart before we have a chance to review them but we will give you a call once they are reviewed by [name]. If we ordered any referrals today, please let us know if you have not heard from their office within the next week.   Please try these tips to maintain a healthy lifestyle:  Eat at least 3 REAL meals and 1-2 snacks per day.  Aim for no more than 5 hours between eating.  If you eat breakfast, please do so within one hour of getting up.   Each meal should contain half fruits/vegetables, one quarter protein, and one quarter carbs (no bigger than a computer mouse)  Cut down on sweet beverages. This includes juice, soda, and sweet tea.   Drink at least 1 glass of water with each meal and aim for at least 8 glasses per day  Exercise at least 150 minutes every week.

## 2020-10-06 ENCOUNTER — Other Ambulatory Visit (HOSPITAL_COMMUNITY): Payer: Self-pay | Admitting: Nurse Practitioner

## 2020-10-11 DIAGNOSIS — R972 Elevated prostate specific antigen [PSA]: Secondary | ICD-10-CM | POA: Diagnosis not present

## 2020-10-18 DIAGNOSIS — R972 Elevated prostate specific antigen [PSA]: Secondary | ICD-10-CM | POA: Diagnosis not present

## 2020-10-24 ENCOUNTER — Encounter: Payer: Self-pay | Admitting: Family Medicine

## 2020-11-02 ENCOUNTER — Ambulatory Visit: Payer: Medicare PPO | Attending: Internal Medicine

## 2020-11-02 ENCOUNTER — Other Ambulatory Visit (HOSPITAL_BASED_OUTPATIENT_CLINIC_OR_DEPARTMENT_OTHER): Payer: Self-pay

## 2020-11-02 DIAGNOSIS — Z23 Encounter for immunization: Secondary | ICD-10-CM

## 2020-11-02 MED ORDER — MODERNA COVID-19 BIVAL BOOSTER 50 MCG/0.5ML IM SUSP
INTRAMUSCULAR | 0 refills | Status: DC
  Filled 2020-11-02: qty 0.5, 1d supply, fill #0

## 2020-11-02 NOTE — Progress Notes (Signed)
   Covid-19 Vaccination Clinic  Name:  Ronnie Black    MRN: [id number] DOB: 1945-02-28  11/02/2020  Mr. Passmore was observed post Covid-19 immunization for 15 minutes without incident. He was provided with Vaccine Information Sheet and instruction to access the V-Safe system.   Mr. Lortie was instructed to call 911 with any severe reactions post vaccine: Difficulty breathing  Swelling of face and throat  A fast heartbeat  A bad rash all over body  Dizziness and weakness

## 2020-11-12 ENCOUNTER — Other Ambulatory Visit: Payer: Self-pay | Admitting: Internal Medicine

## 2020-11-13 ENCOUNTER — Ambulatory Visit: Payer: Medicare PPO | Admitting: Physical Therapy

## 2020-11-13 ENCOUNTER — Other Ambulatory Visit: Payer: Self-pay

## 2020-11-13 DIAGNOSIS — M25551 Pain in right hip: Secondary | ICD-10-CM

## 2020-11-13 DIAGNOSIS — M25652 Stiffness of left hip, not elsewhere classified: Secondary | ICD-10-CM | POA: Diagnosis not present

## 2020-11-13 DIAGNOSIS — M25651 Stiffness of right hip, not elsewhere classified: Secondary | ICD-10-CM

## 2020-11-13 NOTE — Telephone Encounter (Signed)
Prescription refill request for Eliquis received. Indication: Afib  Last office visit: 06/30/20 Chalmers Cater)  Scr: 0.88 (06/30/20) Age: 75 Weight: 68.9kg  Appropriate dose and refill sent to requested pharmacy.

## 2020-11-14 ENCOUNTER — Other Ambulatory Visit (HOSPITAL_COMMUNITY): Payer: Self-pay

## 2020-11-22 ENCOUNTER — Ambulatory Visit: Payer: Medicare PPO | Admitting: Physical Therapy

## 2020-11-22 ENCOUNTER — Other Ambulatory Visit: Payer: Self-pay

## 2020-11-22 DIAGNOSIS — M25651 Stiffness of right hip, not elsewhere classified: Secondary | ICD-10-CM

## 2020-11-22 DIAGNOSIS — M25652 Stiffness of left hip, not elsewhere classified: Secondary | ICD-10-CM

## 2020-11-22 DIAGNOSIS — M25551 Pain in right hip: Secondary | ICD-10-CM

## 2020-11-22 DIAGNOSIS — R972 Elevated prostate specific antigen [PSA]: Secondary | ICD-10-CM | POA: Diagnosis not present

## 2020-11-22 NOTE — Patient Instructions (Signed)
Access Code: [email or web address] URL: [email or web address] Date: 11/22/2020 Prepared by: Lyndee Hensen  Exercises Single Knee to Chest Stretch - 2 x daily - 3 reps - 30 hold Supine Piriformis Stretch Pulling Heel to Hip - 2 x daily - 3 reps - 30 hold Supine Lower Trunk Rotation - 2 x daily - 10 reps - 5 hold Seated Hamstring Stretch - 2 x daily - 3 reps - 30 hold

## 2020-11-26 ENCOUNTER — Encounter: Payer: Self-pay | Admitting: Physical Therapy

## 2020-11-26 NOTE — Therapy (Signed)
[facility] [address] [place], [place], [address] Phone: [phone]   Fax:  [phone]  Physical Therapy Evaluation  Patient Details  Name: Ronnie Black MRN: [id number] Date of Birth: [birth year] Referring Provider (PT): Dimas Chyle   Encounter Date: 11/13/2020   PT End of Session - 11/26/20 0833     Visit Number 1    Number of Visits 16    Date for PT Re-Evaluation 01/08/21    Authorization Type [employer]    PT Start Time 1430    PT Stop Time 9702    PT Time Calculation (min) 45 min    Activity Tolerance Patient tolerated treatment well    Behavior During Therapy [facility] for tasks assessed/performed             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   Persistent atrial fibrillation ([place])    s/p PVI 3/12    Past Surgical History:  Procedure Laterality Date   APPENDECTOMY     atrial fibrillation ablation  03/27/10   PVI with CTI ablation by [place] N/A 11/21/2016   Procedure: ATRIAL FIBRILLATION ABLATION;  Surgeon: [name], MD;  Location: [place] CV [facility];  Service: Cardiovascular;  Laterality: N/A;   CARDIOVERSION     CARDIOVERSION  04/26/2011   Procedure: CARDIOVERSION;  Surgeon: Larey Dresser, MD;  Location: [place];  Service: Cardiovascular;  Laterality: N/A;   CARDIOVERSION N/A 04/28/2012   Procedure: CARDIOVERSION;  Surgeon: Thayer Headings, MD;  Location: [place];  Service: Cardiovascular;  Laterality: N/A;   CARDIOVERSION N/A 07/28/2012   Procedure: CARDIOVERSION;  Surgeon: Lelon Perla, MD;  Location: [facility] ENDOSCOPY;  Service: Cardiovascular;  Laterality: N/A;   CARDIOVERSION N/A 08/14/2012   Procedure: CARDIOVERSION;  Surgeon: Josue Hector, MD;  Location: [place];   Service: Cardiovascular;  Laterality: N/A;   CARDIOVERSION N/A 08/11/2014   Procedure: CARDIOVERSION;  Surgeon: Fay Records, MD;  Location: [place];  Service: Cardiovascular;  Laterality: N/A;   CARDIOVERSION N/A 08/26/2014   Procedure: CARDIOVERSION;  Surgeon: Sanda Klein, MD;  Location: [place];  Service: Cardiovascular;  Laterality: N/A;   CARDIOVERSION N/A 10/10/2016   Procedure: CARDIOVERSION;  Surgeon: Larey Dresser, MD;  Location: [place];  Service: Cardiovascular;  Laterality: N/A;   CARDIOVERSION N/A 12/04/2016   Procedure: CARDIOVERSION;  Surgeon: Larey Dresser, MD;  Location: [place];  Service: Cardiovascular;  Laterality: N/A;   CARDIOVERSION N/A 06/09/2018   Procedure: CARDIOVERSION;  Surgeon: Dorothy Spark, MD;  Location: [place];  Service: Cardiovascular;  Laterality: N/A;   CARDIOVERSION N/A 08/17/2019   Procedure: CARDIOVERSION;  Surgeon: [name], MD;  Location: [place];  Service: Cardiovascular;  Laterality: N/A;   CARDIOVERSION N/A 11/18/2019   Procedure: CARDIOVERSION;  Surgeon: [name], MD;  Location: [place];  Service: Cardiovascular;  Laterality: N/A;   CARDIOVERSION N/A 03/09/2020   Procedure: CARDIOVERSION;  Surgeon: [name], MD;  Location: [place];  Service: Cardiovascular;  Laterality: N/A;   SKIN CANCER EXCISION     TEE WITHOUT CARDIOVERSION  04/26/2011   Procedure: TRANSESOPHAGEAL ECHOCARDIOGRAM (TEE);  Surgeon: Larey Dresser, MD;  Location: [facility] ENDOSCOPY;  Service: Cardiovascular;  Laterality: N/A;  to be done at 1330  TEE WITHOUT CARDIOVERSION N/A 04/28/2012   Procedure: TRANSESOPHAGEAL ECHOCARDIOGRAM (TEE);  Surgeon: Thayer Headings, MD;  Location: [place];  Service: Cardiovascular;  Laterality: N/A;   WISDOM TOOTH EXTRACTION      There were no vitals filed for this visit.    Subjective Assessment - 11/26/20 0828     Subjective Pt saw Dr in May, had pain in glute, that seems  to be better now. He reprots pain in hip joint with initial standing, and stiffness feeling. He is very active, likes to exercise, several days/wk. He lives at [facility], independent living, no stairs.    Pertinent History A-fib,    Limitations Standing;Walking;House hold activities    Patient Stated Goals decreased pain    Currently in Pain? Yes    Pain Score 4     Pain Location Hip    Pain Orientation Right    Pain Descriptors / Indicators Aching    Pain Type Acute pain    Pain Onset More than a month ago    Pain Frequency Intermittent    Aggravating Factors  initial standing, stiffness with exercise    Pain Relieving Factors stretching                [facility] PT Assessment - 11/26/20 0001       Assessment   Medical Diagnosis R hip pain    Referring Provider (PT) Dimas Chyle    Prior Therapy no      Precautions   Precautions None      Prior Function   Level of Independence Independent      Cognition   Overall Cognitive Status Within Functional Limits for tasks assessed      AROM   Overall AROM Comments Lumbar: mod limitation for flex and ext:  flexion: 10 in from floor.  R Hips: moderate limitation for flexion, ER, significant limitation for IR, L hip: mod limitation for all motions.      Strength   Overall Strength Comments hips: 4/5      Palpation   Palpation comment hypomobile hip joints, R>L.                        Objective measurements completed on examination: See above findings.       [facility] Adult PT Treatment/Exercise - 11/26/20 0001       Lumbar Exercises: Stretches   Active Hamstring Stretch 3 reps;30 seconds    Active Hamstring Stretch Limitations seated    Single Knee to Chest Stretch 3 reps;30 seconds    Lower Trunk Rotation 5 reps;10 seconds    Piriformis Stretch 3 reps;30 seconds    Piriformis Stretch Limitations supine/modified                     PT Education - 11/26/20 4098     Education Details PT POC,  Exam findings, HEP    Person(s) Educated Patient    Methods Explanation;Demonstration;Tactile cues;Verbal cues;Handout    Comprehension Verbalized understanding;Returned demonstration;Verbal cues required;Tactile cues required;Need further instruction              PT Short Term Goals - 11/26/20 0839       PT SHORT TERM GOAL #1   Title Pt to be independent with initial HEP    Time 2    Period Weeks    Status New    Target Date 11/27/20  PT Long Term Goals - 11/26/20 0840       PT LONG TERM GOAL #1   Title Pt to be independent with final HEP    Time 8    Period Weeks    Status New    Target Date 01/08/21      PT LONG TERM GOAL #2   Title Pt to report decreased pain in R hip, to 0-2/10 with transfers, initial standing, and standing activity.    Time 8    Period Weeks    Status New    Target Date 01/08/21      PT LONG TERM GOAL #3   Title Pt to demo ability for bend, lift, squat, to lift object from floor, (with optimal mechanics) to improve ability for IADLS    Time 8    Period Weeks    Status New    Target Date 01/08/21                    Plan - 11/26/20 5361     Clinical Impression Statement Pt presents wtih primary complaint of increased pain in R hip. He has limited ROM and joint siffness in bil hips, R>L. He has increased pain with initial standing, and transition movements, as well as longer duration standing/walking. Pt very active, but has lack of effective HEP focus on hip and back. Pt with decreased ability for full fuctional activies due to pain, and will benefit from skilled PT to improve.    Examination-Activity Limitations Stand;Locomotion Level;Transfers;Squat;Stairs    Examination-Participation Restrictions Yard Work;Cleaning;Community Activity    Stability/Clinical Decision Making Stable/Uncomplicated    Clinical Decision Making Low    PT Frequency 2x / week    PT Duration 6 weeks    PT Treatment/Interventions  ADLs/Self Care Home Management;Cryotherapy;DME Instruction;Ultrasound;Moist Heat;Iontophoresis 4mg /ml Dexamethasone;Gait training;Stair training;Functional mobility training;Balance training;Therapeutic exercise;Therapeutic activities;Neuromuscular re-education;Patient/family education;Passive range of motion;Dry needling;Taping;Spinal Manipulations;Joint Manipulations;Vasopneumatic Device;Energy conservation    PT Home Exercise Plan [email or web address]    Consulted and Agree with Plan of Care Patient             Patient will benefit from skilled therapeutic intervention in order to improve the following deficits and impairments:  Pain, Increased muscle spasms, Decreased mobility, Decreased activity tolerance, Decreased range of motion, Decreased strength, Hypomobility, Impaired flexibility  Visit Diagnosis: Pain in right hip  Stiffness of right hip, not elsewhere classified  Stiffness of left hip, not elsewhere classified     Problem List Patient Active Problem List   Diagnosis Date Noted   Essential hypertension 12/01/2019   Secondary hypercoagulable state ([place]) 08/24/2019   Current use of long term anticoagulation 11/06/2018   Atrial fibrillation status post cardioversion ([place]) 02/24/2017   Persistent atrial fibrillation ([place]) 11/21/2016   Benign prostatic hyperplasia without lower urinary tract symptoms 11/07/2016   Family history of colon cancer 10/01/2012   Herniated lumbar disc without myelopathy 07/08/2012   ED (erectile dysfunction) 01/07/2012   Actinic keratosis 01/07/2012   Seborrheic keratosis 01/07/2012   CAROTID BRUIT 08/24/2008   Mixed hyperlipidemia 08/13/2008   HOCM (hypertrophic obstructive cardiomyopathy) ([place]) 08/13/2008    Lyndee Hensen, PT, DPT 8:59 AM  11/26/20   [facility] [place], [place], [address] Phone: [phone]   Fax:  [phone]  Name: Ronnie Black MRN: [id number] Date of Birth:  March 07, 1945

## 2020-11-26 NOTE — Therapy (Signed)
[facility] [address] [place], [place], [address] Phone: [phone]   Fax:  [phone]  Physical Therapy Treatment  Patient Details  Name: Ronnie Black MRN: [id number] Date of Birth: May 21, 1945 Referring Provider (PT): Dimas Chyle   Encounter Date: 11/22/2020   PT End of Session - 11/26/20 0905     Visit Number 2    Number of Visits 16    Date for PT Re-Evaluation 01/08/21    Authorization Type [employer]    PT Start Time 0802    PT Stop Time 0844    PT Time Calculation (min) 42 min    Activity Tolerance Patient tolerated treatment well    Behavior During Therapy [facility] for tasks assessed/performed             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   Persistent atrial fibrillation ([place])    s/p PVI 3/12    Past Surgical History:  Procedure Laterality Date   APPENDECTOMY     atrial fibrillation ablation  03/27/10   PVI with CTI ablation by [place] N/A 11/21/2016   Procedure: ATRIAL FIBRILLATION ABLATION;  Surgeon: [name], MD;  Location: [place] [facility];  Service: Cardiovascular;  Laterality: N/A;   CARDIOVERSION     CARDIOVERSION  04/26/2011   Procedure: CARDIOVERSION;  Surgeon: Larey Dresser, MD;  Location: [place];  Service: Cardiovascular;  Laterality: N/A;   CARDIOVERSION N/A 04/28/2012   Procedure: CARDIOVERSION;  Surgeon: Thayer Headings, MD;  Location: [place];  Service: Cardiovascular;  Laterality: N/A;   CARDIOVERSION N/A 07/28/2012   Procedure: CARDIOVERSION;  Surgeon: Lelon Perla, MD;  Location: [facility] ENDOSCOPY;  Service: Cardiovascular;  Laterality: N/A;   CARDIOVERSION N/A 08/14/2012   Procedure: CARDIOVERSION;  Surgeon: Josue Hector, MD;  Location: [place];  Service:  Cardiovascular;  Laterality: N/A;   CARDIOVERSION N/A 08/11/2014   Procedure: CARDIOVERSION;  Surgeon: Fay Records, MD;  Location: [place];  Service: Cardiovascular;  Laterality: N/A;   CARDIOVERSION N/A 08/26/2014   Procedure: CARDIOVERSION;  Surgeon: Sanda Klein, MD;  Location: [place];  Service: Cardiovascular;  Laterality: N/A;   CARDIOVERSION N/A 10/10/2016   Procedure: CARDIOVERSION;  Surgeon: Larey Dresser, MD;  Location: [place];  Service: Cardiovascular;  Laterality: N/A;   CARDIOVERSION N/A 12/04/2016   Procedure: CARDIOVERSION;  Surgeon: Larey Dresser, MD;  Location: [place];  Service: Cardiovascular;  Laterality: N/A;   CARDIOVERSION N/A 06/09/2018   Procedure: CARDIOVERSION;  Surgeon: Dorothy Spark, MD;  Location: [place];  Service: Cardiovascular;  Laterality: N/A;   CARDIOVERSION N/A 08/17/2019   Procedure: CARDIOVERSION;  Surgeon: [name], MD;  Location: [place];  Service: Cardiovascular;  Laterality: N/A;   CARDIOVERSION N/A 11/18/2019   Procedure: CARDIOVERSION;  Surgeon: [name], MD;  Location: [place];  Service: Cardiovascular;  Laterality: N/A;   CARDIOVERSION N/A 03/09/2020   Procedure: CARDIOVERSION;  Surgeon: [name], MD;  Location: [place];  Service: Cardiovascular;  Laterality: N/A;   SKIN CANCER EXCISION     TEE WITHOUT CARDIOVERSION  04/26/2011   Procedure: TRANSESOPHAGEAL ECHOCARDIOGRAM (TEE);  Surgeon: Larey Dresser, MD;  Location: [facility]OSCOPY;  Service: Cardiovascular;  Laterality: N/A;  to be done at 1330  TEE WITHOUT CARDIOVERSION N/A 04/28/2012   Procedure: TRANSESOPHAGEAL ECHOCARDIOGRAM (TEE);  Surgeon: Thayer Headings, MD;  Location: [place];  Service: Cardiovascular;  Laterality: N/A;   WISDOM TOOTH EXTRACTION      There were no vitals filed for this visit.   Subjective Assessment - 11/26/20 0901     Subjective Pt states stiffness in hip.    Currently in Pain? Yes     Pain Score 4     Pain Location Hip    Pain Orientation Right    Pain Descriptors / Indicators Aching    Pain Type Acute pain    Pain Onset More than a month ago    Pain Frequency Intermittent                               [facility] Adult PT Treatment/Exercise - 11/26/20 0909       Lumbar Exercises: Stretches   Active Hamstring Stretch 3 reps;30 seconds    Active Hamstring Stretch Limitations seated and standing    Single Knee to Chest Stretch 3 reps;30 seconds    Lower Trunk Rotation 5 reps;10 seconds    Hip Flexor Stretch Limitations add    Piriformis Stretch 3 reps;30 seconds    Piriformis Stretch Limitations supine/modified    Other Lumbar Stretch Exercise Hip ER clam stretch 10 sec x 5;      Lumbar Exercises: Aerobic   Recumbent Bike L2 x 6 min;      Lumbar Exercises: Supine   Clam 15 reps    Clam Limitations Blue TB    Bridge 15 reps      Manual Therapy   Manual Therapy Joint mobilization    Joint Mobilization Hip inf and post mobs g 3 on R; , LAD for hips bil                     PT Education - 11/26/20 0906     Education Details Updated HEP    Person(s) Educated Patient    Methods Explanation;Demonstration;Tactile cues;Verbal cues;Handout    Comprehension Verbalized understanding;Returned demonstration;Verbal cues required;Tactile cues required;Need further instruction              PT Short Term Goals - 11/26/20 0839       PT SHORT TERM GOAL #1   Title Pt to be independent with initial HEP    Time 2    Period Weeks    Status New    Target Date 11/27/20               PT Long Term Goals - 11/26/20 0840       PT LONG TERM GOAL #1   Title Pt to be independent with final HEP    Time 8    Period Weeks    Status New    Target Date 01/08/21      PT LONG TERM GOAL #2   Title Pt to report decreased pain in R hip, to 0-2/10 with transfers, initial standing, and standing activity.    Time 8    Period Weeks    Status  New    Target Date 01/08/21      PT LONG TERM GOAL #3   Title Pt to demo ability for bend, lift, squat, to lift object from floor, (with optimal mechanics) to improve ability for IADLS    Time 8    Period Weeks    Status  New    Target Date 01/08/21                   Plan - 11/26/20 0907     Clinical Impression Statement Pt with much stiffness in bil hip joints. Focus on manual for joint mobilization and ther ex for improving ROM today. Pt education and questions answered on several exercises pt is doing as part of his regular exercise routine. Discussed keeping # of exercises minimal, with focus on best things for hips and back. Pt will benefit from continued education on this.    Examination-Activity Limitations Stand;Locomotion Level;Transfers;Squat;Stairs    Examination-Participation Restrictions Yard Work;Cleaning;Community Activity    Stability/Clinical Decision Making Stable/Uncomplicated    PT Frequency 2x / week    PT Duration 6 weeks    PT Treatment/Interventions ADLs/Self Care Home Management;Cryotherapy;DME Instruction;Ultrasound;Moist Heat;Iontophoresis 4mg /ml Dexamethasone;Gait training;Stair training;Functional mobility training;Balance training;Therapeutic exercise;Therapeutic activities;Neuromuscular re-education;Patient/family education;Passive range of motion;Dry needling;Taping;Spinal Manipulations;Joint Manipulations;Vasopneumatic Device;Energy conservation    PT Home Exercise Plan [email or web address]    Consulted and Agree with Plan of Care Patient             Patient will benefit from skilled therapeutic intervention in order to improve the following deficits and impairments:  Pain, Increased muscle spasms, Decreased mobility, Decreased activity tolerance, Decreased range of motion, Decreased strength, Hypomobility, Impaired flexibility  Visit Diagnosis: Pain in right hip  Stiffness of right hip, not elsewhere classified  Stiffness of left hip, not elsewhere  classified     Problem List Patient Active Problem List   Diagnosis Date Noted   Essential hypertension 12/01/2019   Secondary hypercoagulable state ([place]) 08/24/2019   Current use of long term anticoagulation 11/06/2018   Atrial fibrillation status post cardioversion ([place]) 02/24/2017   Persistent atrial fibrillation ([place]) 11/21/2016   Benign prostatic hyperplasia without lower urinary tract symptoms 11/07/2016   Family history of colon cancer 10/01/2012   Herniated lumbar disc without myelopathy 07/08/2012   ED (erectile dysfunction) 01/07/2012   Actinic keratosis 01/07/2012   Seborrheic keratosis 01/07/2012   CAROTID BRUIT 08/24/2008   Mixed hyperlipidemia 08/13/2008   HOCM (hypertrophic obstructive cardiomyopathy) ([place]) 08/13/2008    Lyndee Hensen, PT, DPT 9:12 AM  11/26/20    [facility] [address] [place], [place], [address] Phone: [phone]   Fax:  [phone]  Name: Ronnie Black MRN: [id number] Date of Birth: [birth year]

## 2020-11-28 ENCOUNTER — Other Ambulatory Visit (HOSPITAL_COMMUNITY): Payer: Self-pay | Admitting: Nurse Practitioner

## 2020-12-01 ENCOUNTER — Encounter: Payer: Medicare PPO | Admitting: Family Medicine

## 2020-12-01 ENCOUNTER — Ambulatory Visit (INDEPENDENT_AMBULATORY_CARE_PROVIDER_SITE_OTHER)
Admission: RE | Admit: 2020-12-01 | Discharge: 2020-12-01 | Disposition: A | Discharge status: Home / Self Care | Payer: Medicare PPO | Source: Ambulatory Visit | Attending: Family Medicine | Admitting: Family Medicine

## 2020-12-01 ENCOUNTER — Other Ambulatory Visit: Payer: Self-pay

## 2020-12-01 ENCOUNTER — Encounter: Payer: Self-pay | Admitting: Family Medicine

## 2020-12-01 ENCOUNTER — Ambulatory Visit (INDEPENDENT_AMBULATORY_CARE_PROVIDER_SITE_OTHER): Payer: Medicare PPO | Admitting: Family Medicine

## 2020-12-01 VITALS — BP 130/89 | HR 65 | Temp 97.0°F | Ht 66.0 in | Wt 149.4 lb

## 2020-12-01 DIAGNOSIS — Z7901 Long term (current) use of anticoagulants: Secondary | ICD-10-CM

## 2020-12-01 DIAGNOSIS — M25551 Pain in right hip: Secondary | ICD-10-CM

## 2020-12-01 DIAGNOSIS — Z0000 Encounter for general adult medical examination without abnormal findings: Secondary | ICD-10-CM | POA: Diagnosis not present

## 2020-12-01 DIAGNOSIS — I4891 Unspecified atrial fibrillation: Secondary | ICD-10-CM

## 2020-12-01 DIAGNOSIS — M25562 Pain in left knee: Secondary | ICD-10-CM

## 2020-12-01 DIAGNOSIS — E782 Mixed hyperlipidemia: Secondary | ICD-10-CM

## 2020-12-01 DIAGNOSIS — I4819 Other persistent atrial fibrillation: Secondary | ICD-10-CM

## 2020-12-01 DIAGNOSIS — I10 Essential (primary) hypertension: Secondary | ICD-10-CM

## 2020-12-01 DIAGNOSIS — M545 Low back pain, unspecified: Secondary | ICD-10-CM | POA: Diagnosis not present

## 2020-12-01 LAB — CBC WITH DIFFERENTIAL/PLATELET
Basophils Absolute: 0 10*3/uL (ref 0.0–0.1)
Basophils Relative: 0.5 % (ref 0.0–3.0)
Eosinophils Absolute: 0.2 10*3/uL (ref 0.0–0.7)
Eosinophils Relative: 3 % (ref 0.0–5.0)
HCT: 45.5 % (ref 39.0–52.0)
Hemoglobin: 15.4 g/dL (ref 13.0–17.0)
Lymphocytes Relative: 42.2 % (ref 12.0–46.0)
Lymphs Abs: 2.2 10*3/uL (ref 0.7–4.0)
MCHC: 33.8 g/dL (ref 30.0–36.0)
MCV: 86.8 fl (ref 78.0–100.0)
Monocytes Absolute: 0.4 10*3/uL (ref 0.1–1.0)
Monocytes Relative: 7.6 % (ref 3.0–12.0)
Neutro Abs: 2.4 10*3/uL (ref 1.4–7.7)
Neutrophils Relative %: 46.7 % (ref 43.0–77.0)
Platelets: 190 10*3/uL (ref 150.0–400.0)
RBC: 5.23 Mil/uL (ref 4.22–5.81)
RDW: 12.8 % (ref 11.5–15.5)
WBC: 5.2 10*3/uL (ref 4.0–10.5)

## 2020-12-01 LAB — COMPREHENSIVE METABOLIC PANEL
ALT: 20 U/L (ref 0–53)
AST: 19 U/L (ref 0–37)
Albumin: 4.6 g/dL (ref 3.5–5.2)
Alkaline Phosphatase: 57 U/L (ref 39–117)
BUN: 14 mg/dL (ref 6–23)
CO2: 28 mEq/L (ref 19–32)
Calcium: 9.4 mg/dL (ref 8.4–10.5)
Chloride: 101 mEq/L (ref 96–112)
Creatinine, Ser: 0.84 mg/dL (ref 0.40–1.50)
GFR: 84.12 mL/min (ref >60.00)
Glucose, Bld: 85 mg/dL (ref 70–99)
Potassium: 4.4 mEq/L (ref 3.5–5.1)
Sodium: 137 mEq/L (ref 135–145)
Total Bilirubin: 1 mg/dL (ref 0.2–1.2)
Total Protein: 6.6 g/dL (ref 6.0–8.3)

## 2020-12-01 LAB — TSH: TSH: 3.45 u[IU]/mL (ref 0.35–5.50)

## 2020-12-01 LAB — LIPID PANEL
Cholesterol: 141 mg/dL (ref 0–200)
HDL: 44.8 mg/dL (ref >39.00)
LDL Cholesterol: 83 mg/dL (ref 0–99)
NonHDL: 95.86
Total CHOL/HDL Ratio: 3
Triglycerides: 65 mg/dL (ref 0.0–149.0)
VLDL: 13 mg/dL (ref 0.0–40.0)

## 2020-12-01 NOTE — Patient Instructions (Signed)
Please return in 12 months for your annual complete physical; please come fasting.   I will release your lab results to you on your MyChart account with further instructions. Please reply with any questions.    Please go to our [facility]  xray department on [address] to get your xrays done. You can walk in M-F between 8:30am- noon or 1pm - 5pm. Tell them you are there for xrays ordered by me. They will send me the results, then I will let you know the results with instructions.   Address: [address]  The Xray department is located in the basement.    If you have any questions or concerns, please don't hesitate to send me a message via MyChart or call the office at [phone]. Thank you for visiting with [name] today! It's our pleasure caring for you.

## 2020-12-01 NOTE — Progress Notes (Signed)
Subjective  Chief Complaint  Patient presents with   Annual Exam    Fasting R hip pain, L knee pain has been seen for both issues before   Hypertension   Hyperlipidemia    HPI: Ronnie Black is a 75 y.o. male who presents to [facility] [place] today for a Male Wellness Visit. He also has the concerns and/or needs as listed above in the chief complaint. These will be addressed in addition to the Health Maintenance Visit.   Wellness Visit: annual visit with health maintenance review and exam   Health maintenance: Screens are all up-to-date.  Immunizations are up-to-date.  Healthy lifestyle.  Overall, he is doing well after the loss of his wife unexpectedly recently.  Body mass index is 24.11 kg/m. Wt Readings from Last 3 Encounters:  12/01/20 149 lb 6.4 oz (67.8 kg)  10/04/20 152 lb (68.9 kg)  06/30/20 155 lb 6.4 oz (70.5 kg)     Chronic disease management visit and/or acute problem visit: Hypertension, persistent A. fib, hocm: Reviewed cardiology notes.  Antiarrhythmic was changed.  Continues on Eliquis.  Blood pressures been well controlled.  No chest pain or palpitations.  No exertional symptoms.  Doing well from a cardiac standpoint. HLD on statin. At goal. No Aes Reviewed notes: Was seen in September for right hip pain.  Likely osteoarthritis.  Points to groin.  Decreased range of motion.  Also had some buttock pain.  Seeing physical therapist.  Pain is in proving but range of motion remains limited.  Pain is mild.  Not interested in referral at this point.  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  He has history of piriformis syndrome. Left medial knee pain: Started back in February.  He had Baker's cyst that was ruptured at that time.  Symptoms resolved completely until about 2 to 3 weeks ago.  Now mild medial knee soreness/tenderness resume.  Worse last week.  Improving on its own.  He wonders what is causing it.  He runs occasionally.  No significant  heavy work and no injury to knee.  No calf pain.  Had normal knee x-ray in February.  Patient Active Problem List   Diagnosis Date Noted   Essential hypertension 12/01/2019   Current use of long term anticoagulation 11/06/2018   Atrial fibrillation status post cardioversion [facility]) 02/24/2017   Family history of colon cancer 10/01/2012   Mixed hyperlipidemia 08/13/2008   HOCM (hypertrophic obstructive cardiomyopathy) ([place]) 08/13/2008   Persistent atrial fibrillation ([place]) 11/21/2016   Benign prostatic hyperplasia without lower urinary tract symptoms 11/07/2016   Herniated lumbar disc without myelopathy 07/08/2012   ED (erectile dysfunction) 01/07/2012   Actinic keratosis 01/07/2012   Seborrheic keratosis 01/07/2012   CAROTID BRUIT 08/24/2008   Health Maintenance  Topic Date Due   COLONOSCOPY (Pts 45-67yrs Insurance coverage will need to be confirmed)  05/07/2022   TETANUS/TDAP  11/08/2026   Pneumonia Vaccine 24+ Years old  Completed   INFLUENZA VACCINE  Completed   COVID-19 Vaccine  Completed   Hepatitis C Screening  Completed   Zoster Vaccines- Shingrix  Completed   HPV VACCINES  Aged Out   Immunization History  Administered Date(s) Administered   Fluad Quad(high Dose 65+) 11/02/2019, 10/04/2020   Influenza, High Dose Seasonal PF 11/02/2015, 11/07/2016, 11/13/2017   Influenza, Seasonal, Injecte, Preservative Fre 11/04/2013   Moderna Covid-19 Vaccine Bivalent Booster 61yrs & up 11/02/2020   Moderna SARS-COV2 Booster Vaccination 06/29/2020   [place] Sars-Covid-2 Vaccination  03/03/2019, 03/24/2019, 12/07/2019   Pneumococcal Conjugate-13 12/02/2013   Pneumococcal Polysaccharide-23 01/07/2012   Tdap 11/07/2016   Zoster Recombinat (Shingrix) 12/04/2018, 06/03/2019   Zoster, Live 01/22/2010   We updated and reviewed the patient's past history in detail and it is documented below. Allergies: Patient has No Known Allergies. Past Medical History  has a past medical history of  Current use of long term anticoagulation (11/06/2018), DDD (degenerative disc disease), HLD (hyperlipidemia), Hypertension, Hypertr obst cardiomyop, and Persistent atrial fibrillation ([place]). Past Surgical History Patient  has a past surgical history that includes Appendectomy; atrial fibrillation ablation (03/27/10); Wisdom tooth extraction; Skin cancer excision; Cardioversion; TEE without cardioversion (04/26/2011); Cardioversion (04/26/2011); TEE without cardioversion (N/A, 04/28/2012); Cardioversion (N/A, 04/28/2012); Cardioversion (N/A, 07/28/2012); Cardioversion (N/A, 08/14/2012); Cardioversion (N/A, 08/11/2014); Cardioversion (N/A, 08/26/2014); Cardioversion (N/A, 10/10/2016); ATRIAL FIBRILLATION ABLATION (N/A, 11/21/2016); Cardioversion (N/A, 12/04/2016); Cardioversion (N/A, 06/09/2018); Cardioversion (N/A, 08/17/2019); Cardioversion (N/A, 11/18/2019); and Cardioversion (N/A, 03/09/2020). Social History Patient  reports that he quit smoking about 45 years ago. His smoking use included cigarettes. He has a 15.00 pack-year smoking history. He has never used smokeless tobacco. He reports current alcohol use of about 6.0 standard drinks per week. He reports that he does not use drugs. Family History family history includes Colon cancer in his father and mother; Coronary artery disease in an other family member; Lung cancer in his mother. Review of Systems: Constitutional: negative for fever or malaise Ophthalmic: negative for photophobia, double vision or loss of vision Cardiovascular: negative for chest pain, dyspnea on exertion, or new LE swelling Respiratory: negative for SOB or persistent cough Gastrointestinal: negative for abdominal pain, change in bowel habits or melena Genitourinary: negative for dysuria or gross hematuria Musculoskeletal: negative for new gait disturbance or muscular weakness Integumentary: negative for new or persistent rashes Neurological: negative for TIA or stroke symptoms Psychiatric:  negative for SI or delusions Allergic/Immunologic: negative for hives  Patient Care Team    Relationship Specialty Notifications Start End  [name], MD PCP - General Family Medicine  11/06/18   [name], MD PCP - Electrophysiology Cardiology  06/29/20   [name], MD  Cardiology  12/10/10   [facility] Consulting Physician Urology  11/06/18   Laurin Coder, MD Consulting Physician Pulmonary Disease  11/06/18   [place] Campbell, MD Consulting Physician Gastroenterology  12/01/19    Objective  Vitals: BP 130/89   Pulse 65   Temp (!) 97 F (36.1 C) (Temporal)   Ht 5\' 6"  (1.676 m)   Wt 149 lb 6.4 oz (67.8 kg)   SpO2 99%   BMI 24.11 kg/m  General:  Well developed, well nourished, no acute distress  Psych:  Alert and orientedx3,normal mood and affect HEENT:  Normocephalic, atraumatic, non-icteric sclera, PERRL, oropharynx is clear without mass or exudate, supple neck without adenopathy, mass or thyromegaly Cardiovascular:  Normal S1, S2, RRR without gallop, rub or murmur, nondisplaced PMI, +2 distal pulses in bilateral upper and lower extremities. Respiratory:  Good breath sounds bilaterally, CTAB with normal respiratory effort Gastrointestinal: normal bowel sounds, soft, non-tender, no noted masses. No HSM MSK: no deformities, contusions. Joints are without erythema or swelling. Spine and CVA region are nontender.  Left medial proximal tibia with minimal tenderness.  No effusion, warmth, erythema or swelling.  Knee is otherwise normal. Bilateral hips with decreased range of motion right greater than left. Skin:  Warm, no rashes or suspicious lesions noted Neurologic:    Mental status is normal. CN 2-11 are normal.  Gross motor and sensory exams are normal. Stable gait. No tremor GU: No inguinal hernias or adenopathy are appreciated bilaterally   Assessment  1. Annual physical exam   2. Essential hypertension   3. Mixed hyperlipidemia   4.  Current use of long term anticoagulation   5. Atrial fibrillation status post cardioversion (HCC)   6. Persistent atrial fibrillation (H[facility])   7. Pain in right hip   8. Medial knee pain, left      Plan  Male Wellness Visit: Age appropriate Health Maintenance and Prevention measures were discussed with patient. Included topics are cancer screening recommendations, ways to keep healthy (see AVS) including dietary and exercise recommendations, regular eye and dental care, use of seat belts, and avoidance of moderate alcohol use and tobacco use.  Screens are current BMI: discussed patient's BMI and encouraged positive lifestyle modifications to help get to or maintain a target BMI. HM needs and immunizations were addressed and ordered. See below for orders. See HM and immunization section for updates. Routine labs and screening tests ordered including cmp, cbc and lipids where appropriate. Discussed recommendations regarding Vit D and calcium supplementation (see AVS)  Chronic disease f/u and/or acute problem visit: (deemed necessary to be done in addition to the wellness visit): Hypertension: Well-controlled.  Metoprolol 25 twice daily Persistent A. fib: Stable on Eliquis and Tikosyn.  S/p cardioverted.  Metoprolol 25 twice daily Hyperlipidemia been well controlled on Lipitor 10 mg nightly. Knee pain: Unclear etiology.  Mild.  Since it is resolving, patient does not request further evaluation at this time.  Recommend sports medicine if worsens again.  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  Check x-rays.  Education counseling given.  Supportive care.  Check lumbar spine films as well.  No red flag symptoms today.  Continue physical therapy.  Follow up: 12 months for complete physical [facility] side effects, risks, benefits, and alternatives for medications and treatment plan prescribed today were discussed, and the patient expressed understanding of the given instructions.  Patient is instructed to call or message via MyChart if he/she has any questions or concerns regarding our treatment plan. No barriers to understanding were identified. We discussed Red Flag symptoms and signs in detail. Patient expressed understanding regarding what to do in case of urgent or emergency type symptoms.  Medication list was reconciled, printed and provided to the patient in AVS. Patient instructions and summary information was reviewed with the patient as documented in the AVS. This note was prepared with assistance of Dragon voice recognition software. Occasional wrong-word or sound-a-like substitutions may have occurred due to the inherent limitations of voice recognition software  This visit occurred during the SARS-CoV-2 public health emergency.  Safety protocols were in place, including screening questions prior to the visit, additional usage of staff PPE, and extensive cleaning of exam room while observing appropriate contact time as indicated for disinfecting solutions.   Orders Placed This Encounter  Procedures   DG Lumbar Spine Complete   DG HIPS BILAT W OR W/O PELVIS MIN 5 VIEWS   CBC with Differential/Platelet   Comprehensive metabolic panel   Lipid panel   TSH   No orders of the defined types were placed in this encounter.

## 2020-12-04 ENCOUNTER — Other Ambulatory Visit: Payer: Self-pay

## 2020-12-04 ENCOUNTER — Ambulatory Visit: Payer: Medicare PPO | Admitting: Physical Therapy

## 2020-12-04 ENCOUNTER — Encounter: Payer: Self-pay | Admitting: Family Medicine

## 2020-12-04 ENCOUNTER — Encounter: Payer: Self-pay | Admitting: Physical Therapy

## 2020-12-04 DIAGNOSIS — M25651 Stiffness of right hip, not elsewhere classified: Secondary | ICD-10-CM

## 2020-12-04 DIAGNOSIS — M160 Bilateral primary osteoarthritis of hip: Secondary | ICD-10-CM | POA: Insufficient documentation

## 2020-12-04 DIAGNOSIS — M25652 Stiffness of left hip, not elsewhere classified: Secondary | ICD-10-CM | POA: Diagnosis not present

## 2020-12-04 DIAGNOSIS — M25551 Pain in right hip: Secondary | ICD-10-CM | POA: Diagnosis not present

## 2020-12-04 NOTE — Therapy (Signed)
[facility] [address] [place], [place], [address] Phone: [phone]   Fax:  [phone]  Physical Therapy Treatment  Patient Details  Name: Ronnie Black MRN: [id number] Date of Birth: September 13, 1945 Referring Provider (PT): Dimas Chyle   Encounter Date: 12/04/2020   PT End of Session - 12/04/20 1527     Visit Number 3    Number of Visits 16    Date for PT Re-Evaluation 01/08/21    Authorization Type [employer]    PT Start Time 1433    PT Stop Time 1512    PT Time Calculation (min) 39 min    Activity Tolerance Patient tolerated treatment well    Behavior During Therapy [facility] for tasks assessed/performed             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   Persistent atrial fibrillation ([place])    s/p PVI 3/12    Past Surgical History:  Procedure Laterality Date   APPENDECTOMY     atrial fibrillation ablation  03/27/10   PVI with CTI ablation by [place] N/A 11/21/2016   Procedure: ATRIAL FIBRILLATION ABLATION;  Surgeon: [name], MD;  Location: [place] CV LAB;  Service: Cardiovascular;  Laterality: N/A;   CARDIOVERSION     CARDIOVERSION  04/26/2011   Procedure: CARDIOVERSION;  Surgeon: Larey Dresser, MD;  Location: [place];  Service: Cardiovascular;  Laterality: N/A;   CARDIOVERSION N/A 04/28/2012   Procedure: CARDIOVERSION;  Surgeon: Thayer Headings, MD;  Location: [place];  Service: Cardiovascular;  Laterality: N/A;   CARDIOVERSION N/A 07/28/2012   Procedure: CARDIOVERSION;  Surgeon: Lelon Perla, MD;  Location: [facility] ENDOSCOPY;  Service: Cardiovascular;  Laterality: N/A;   CARDIOVERSION N/A 08/14/2012   Procedure: CARDIOVERSION;  Surgeon: Josue Hector, MD;  Location: [place];  Service:  Cardiovascular;  Laterality: N/A;   CARDIOVERSION N/A 08/11/2014   Procedure: CARDIOVERSION;  Surgeon: Fay Records, MD;  Location: [place];  Service: Cardiovascular;  Laterality: N/A;   CARDIOVERSION N/A 08/26/2014   Procedure: CARDIOVERSION;  Surgeon: Sanda Klein, MD;  Location: [place];  Service: Cardiovascular;  Laterality: N/A;   CARDIOVERSION N/A 10/10/2016   Procedure: CARDIOVERSION;  Surgeon: Larey Dresser, MD;  Location: [place];  Service: Cardiovascular;  Laterality: N/A;   CARDIOVERSION N/A 12/04/2016   Procedure: CARDIOVERSION;  Surgeon: Larey Dresser, MD;  Location: [place];  Service: Cardiovascular;  Laterality: N/A;   CARDIOVERSION N/A 06/09/2018   Procedure: CARDIOVERSION;  Surgeon: Dorothy Spark, MD;  Location: [place];  Service: Cardiovascular;  Laterality: N/A;   CARDIOVERSION N/A 08/17/2019   Procedure: CARDIOVERSION;  Surgeon: [name], MD;  Location: [place];  Service: Cardiovascular;  Laterality: N/A;   CARDIOVERSION N/A 11/18/2019   Procedure: CARDIOVERSION;  Surgeon: [name], MD;  Location: [place];  Service: Cardiovascular;  Laterality: N/A;   CARDIOVERSION N/A 03/09/2020   Procedure: CARDIOVERSION;  Surgeon: [name], MD;  Location: [place];  Service: Cardiovascular;  Laterality: N/A;   SKIN CANCER EXCISION     TEE WITHOUT CARDIOVERSION  04/26/2011   Procedure: TRANSESOPHAGEAL ECHOCARDIOGRAM (TEE);  Surgeon: Larey Dresser, MD;  Location: [facility] ENDOSCOPY;  Service: Cardiovascular;  Laterality: N/A;  to be done at 1330  TEE WITHOUT CARDIOVERSION N/A 04/28/2012   Procedure: TRANSESOPHAGEAL ECHOCARDIOGRAM (TEE);  Surgeon: Thayer Headings, MD;  Location: [place];  Service: Cardiovascular;  Laterality: N/A;   WISDOM TOOTH EXTRACTION      There were no vitals filed for this visit.   Subjective Assessment - 12/04/20 1442     Subjective Pt states variable pain in hip, mostly after increased  activity, walking more. Also has stiffness. He did have recent x-ray, report viewed today, showing Advanced degenerative changes in both hips, R>L.    Patient Stated Goals decreased pain    Currently in Pain? Yes    Pain Score 3     Pain Location Hip    Pain Orientation Right    Pain Descriptors / Indicators Aching    Pain Type Acute pain    Pain Onset More than a month ago    Pain Frequency Intermittent                               [facility] Adult PT Treatment/Exercise - 12/04/20 0001       Lumbar Exercises: Stretches   Single Knee to Chest Stretch 3 reps;30 seconds    Piriformis Stretch 3 reps;30 seconds    Piriformis Stretch Limitations supine/modified      Lumbar Exercises: Aerobic   Recumbent Bike L2 x 8 min;      Manual Therapy   Manual Therapy Joint mobilization;Passive ROM    Joint Mobilization Hip inf and lateral  mobs g 3 with strap bil , Post mobs,  LAD for hips bil    Passive ROM manual stretching for hip flex and rotation bilaterally                     PT Education - 12/04/20 1527     Education Details Reviewed HEP    Person(s) Educated Patient    Methods Explanation;Demonstration;Verbal cues    Comprehension Verbalized understanding;Returned demonstration;Verbal cues required;Need further instruction              PT Short Term Goals - 12/04/20 1535       PT SHORT TERM GOAL #1   Title Pt to be independent with initial HEP    Time 2    Period Weeks    Status Achieved    Target Date 11/27/20               PT Long Term Goals - 12/04/20 1535       PT LONG TERM GOAL #1   Title Pt to be independent with final HEP    Time 8    Period Weeks      PT LONG TERM GOAL #2   Title Pt to report decreased pain in R hip, to 0-2/10 with transfers, initial standing, and standing activity.    Time 8    Period Weeks    Status New      PT LONG TERM GOAL #3   Title Pt to demo ability for bend, lift, squat, to lift object  from floor, (with optimal mechanics) to improve ability for IADLS    Time 8    Period Weeks    Status New                   Plan - 12/04/20 1543     Clinical Impression Statement Continued to discuss implications for hip arthritis and stiffness in regard to best exercise  and activity. Focus on manual for joint mobilization and ROM today. Exercises and HEP reviewed. Pt doing well with being active, but discussed not over doing activity. Also discussed referral to ortho in future if hip pain worsens.    Examination-Activity Limitations Stand;Locomotion Level;Transfers;Squat;Stairs    Examination-Participation Restrictions Yard Work;Cleaning;Community Activity    Stability/Clinical Decision Making Stable/Uncomplicated    PT Frequency 2x / week    PT Duration 6 weeks    PT Treatment/Interventions ADLs/Self Care Home Management;Cryotherapy;DME Instruction;Ultrasound;Moist Heat;Iontophoresis 4mg /ml Dexamethasone;Gait training;Stair training;Functional mobility training;Balance training;Therapeutic exercise;Therapeutic activities;Neuromuscular re-education;Patient/family education;Passive range of motion;Dry needling;Taping;Spinal Manipulations;Joint Manipulations;Vasopneumatic Device;Energy conservation    PT Home Exercise Plan [email or web address]    Consulted and Agree with Plan of Care Patient             Patient will benefit from skilled therapeutic intervention in order to improve the following deficits and impairments:  Pain, Increased muscle spasms, Decreased mobility, Decreased activity tolerance, Decreased range of motion, Decreased strength, Hypomobility, Impaired flexibility  Visit Diagnosis: Pain in right hip  Stiffness of right hip, not elsewhere classified  Stiffness of left hip, not elsewhere classified     Problem List Patient Active Problem List   Diagnosis Date Noted   Bilateral primary osteoarthritis of hip 12/04/2020   Essential hypertension 12/01/2019   Current  use of long term anticoagulation 11/06/2018   Atrial fibrillation status post cardioversion ([place]) 02/24/2017   Persistent atrial fibrillation ([place]) 11/21/2016   Benign prostatic hyperplasia without lower urinary tract symptoms 11/07/2016   Family history of colon cancer 10/01/2012   Herniated lumbar disc without myelopathy 07/08/2012   ED (erectile dysfunction) 01/07/2012   Actinic keratosis 01/07/2012   Seborrheic keratosis 01/07/2012   CAROTID BRUIT 08/24/2008   Mixed hyperlipidemia 08/13/2008   HOCM (hypertrophic obstructive cardiomyopathy) ([facility]) 08/13/2008    Lyndee Hensen, PT, DPT 3:48 PM  12/04/20    [facility] [address] [place], [place], [address] Phone: [phone]   Fax:  [phone]  Name: Ronnie Black MRN: [id number] Date of Birth: [birth year]

## 2020-12-05 ENCOUNTER — Encounter: Payer: Self-pay | Admitting: Family Medicine

## 2020-12-11 ENCOUNTER — Other Ambulatory Visit: Payer: Self-pay

## 2020-12-11 ENCOUNTER — Ambulatory Visit: Payer: Medicare PPO | Admitting: Physical Therapy

## 2020-12-11 ENCOUNTER — Encounter: Payer: Self-pay | Admitting: Physical Therapy

## 2020-12-11 DIAGNOSIS — M25551 Pain in right hip: Secondary | ICD-10-CM

## 2020-12-11 DIAGNOSIS — M25652 Stiffness of left hip, not elsewhere classified: Secondary | ICD-10-CM | POA: Diagnosis not present

## 2020-12-11 DIAGNOSIS — M25651 Stiffness of right hip, not elsewhere classified: Secondary | ICD-10-CM

## 2020-12-11 NOTE — Therapy (Signed)
[facility] [address] [place], [place], [address] Phone: [phone]   Fax:  [phone]  Physical Therapy Treatment  Patient Details  Name: Ronnie Black MRN: [id number] Date of Birth: 26-Jun-1945 Referring Provider (PT): Dimas Chyle   Encounter Date: 12/11/2020   PT End of Session - 12/11/20 1524     Visit Number 4    Number of Visits 16    Date for PT Re-Evaluation 01/08/21    Authorization Type [employer]    PT Start Time 1430    PT Stop Time 1508    PT Time Calculation (min) 38 min    Activity Tolerance Patient tolerated treatment well    Behavior During Therapy [facility] for tasks assessed/performed             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   Persistent atrial fibrillation ([place])    s/p PVI 3/12    Past Surgical History:  Procedure Laterality Date   APPENDECTOMY     atrial fibrillation ablation  03/27/10   PVI with CTI ablation by [place] N/A 11/21/2016   Procedure: ATRIAL FIBRILLATION ABLATION;  Surgeon: [name], MD;  Location: [place] CV LAB;  Service: Cardiovascular;  Laterality: N/A;   CARDIOVERSION     CARDIOVERSION  04/26/2011   Procedure: CARDIOVERSION;  Surgeon: Larey Dresser, MD;  Location: [place];  Service: Cardiovascular;  Laterality: N/A;   CARDIOVERSION N/A 04/28/2012   Procedure: CARDIOVERSION;  Surgeon: Thayer Headings, MD;  Location: [facility];  Service: Cardiovascular;  Laterality: N/A;   CARDIOVERSION N/A 07/28/2012   Procedure: CARDIOVERSION;  Surgeon: Lelon Perla, MD;  Location: [facility] ENDOSCOPY;  Service: Cardiovascular;  Laterality: N/A;   CARDIOVERSION N/A 08/14/2012   Procedure: CARDIOVERSION;  Surgeon: Josue Hector, MD;  Location: [place];  Service:  Cardiovascular;  Laterality: N/A;   CARDIOVERSION N/A 08/11/2014   Procedure: CARDIOVERSION;  Surgeon: Fay Records, MD;  Location: [place];  Service: Cardiovascular;  Laterality: N/A;   CARDIOVERSION N/A 08/26/2014   Procedure: CARDIOVERSION;  Surgeon: Sanda Klein, MD;  Location: [place];  Service: Cardiovascular;  Laterality: N/A;   CARDIOVERSION N/A 10/10/2016   Procedure: CARDIOVERSION;  Surgeon: Larey Dresser, MD;  Location: [place];  Service: Cardiovascular;  Laterality: N/A;   CARDIOVERSION N/A 12/04/2016   Procedure: CARDIOVERSION;  Surgeon: Larey Dresser, MD;  Location: [place];  Service: Cardiovascular;  Laterality: N/A;   CARDIOVERSION N/A 06/09/2018   Procedure: CARDIOVERSION;  Surgeon: Dorothy Spark, MD;  Location: [place];  Service: Cardiovascular;  Laterality: N/A;   CARDIOVERSION N/A 08/17/2019   Procedure: CARDIOVERSION;  Surgeon: [name], MD;  Location: [place];  Service: Cardiovascular;  Laterality: N/A;   CARDIOVERSION N/A 11/18/2019   Procedure: CARDIOVERSION;  Surgeon: [name], MD;  Location: [place];  Service: Cardiovascular;  Laterality: N/A;   CARDIOVERSION N/A 03/09/2020   Procedure: CARDIOVERSION;  Surgeon: [name], MD;  Location: [place];  Service: Cardiovascular;  Laterality: N/A;   SKIN CANCER EXCISION     TEE WITHOUT CARDIOVERSION  04/26/2011   Procedure: TRANSESOPHAGEAL ECHOCARDIOGRAM (TEE);  Surgeon: Larey Dresser, MD;  Location: [facility] ENDOSCOPY;  Service: Cardiovascular;  Laterality: N/A;  to be done at 1330  TEE WITHOUT CARDIOVERSION N/A 04/28/2012   Procedure: TRANSESOPHAGEAL ECHOCARDIOGRAM (TEE);  Surgeon: Thayer Headings, MD;  Location: [place];  Service: Cardiovascular;  Laterality: N/A;   WISDOM TOOTH EXTRACTION      There were no vitals filed for this visit.   Subjective Assessment - 12/11/20 1524     Subjective Pt states slight pain in hips, down to about a 2/10 at  times. He has been doing well with HEP and exercise.    Currently in Pain? Yes    Pain Score 2     Pain Location Hip    Pain Orientation Right    Pain Descriptors / Indicators Aching    Pain Type Acute pain    Pain Onset More than a month ago    Pain Frequency Intermittent                               [facility] Adult PT Treatment/Exercise - 12/11/20 0001       Lumbar Exercises: Stretches   Single Knee to Chest Stretch 3 reps;30 seconds    Lower Trunk Rotation 5 reps;10 seconds    Hip Flexor Stretch 3 reps;30 seconds    Hip Flexor Stretch Limitations supine off side of table.    Piriformis Stretch 3 reps;30 seconds    Piriformis Stretch Limitations supine      Lumbar Exercises: Aerobic   Recumbent Bike L2 x 8 min;      Lumbar Exercises: Standing   Other Standing Lumbar Exercises up/down 5 steps x 5, no HR;    Other Standing Lumbar Exercises march x 20, no UE support,  walk/march fwd and bwd 20 ft x 4;      Lumbar Exercises: Supine   Bridge 20 reps      Lumbar Exercises: Quadruped   [place] 20 reps      Manual Therapy   Manual Therapy Joint mobilization;Passive ROM    Joint Mobilization Hip inf and lateral  mobs g 3 with strap bil , Post mobs,  LAD for hips bil    Passive ROM manual stretching for hip flex and rotation bilaterally                       PT Short Term Goals - 12/04/20 1535       PT SHORT TERM GOAL #1   Title Pt to be independent with initial HEP    Time 2    Period Weeks    Status Achieved    Target Date 11/27/20               PT Long Term Goals - 12/04/20 1535       PT LONG TERM GOAL #1   Title Pt to be independent with final HEP    Time 8    Period Weeks      PT LONG TERM GOAL #2   Title Pt to report decreased pain in R hip, to 0-2/10 with transfers, initial standing, and standing activity.    Time 8    Period Weeks    Status New      PT LONG TERM GOAL #3   Title Pt to demo ability for  bend, lift, squat, to lift object from floor, (with optimal mechanics) to improve ability for IADLS    Time 8    Period Weeks    Status New  Plan - 12/11/20 1526     Clinical Impression Statement Pt with no pain with activities today. Ther ex performed for mobility and strengthening of bil hips. He continues to have limitations due to stiffness, but is doing well functionally. Likely d/c next visit.    Examination-Activity Limitations Stand;Locomotion Level;Transfers;Squat;Stairs    Examination-Participation Restrictions Yard Work;Cleaning;Community Activity    Stability/Clinical Decision Making Stable/Uncomplicated    PT Frequency 2x / week    PT Duration 6 weeks    PT Treatment/Interventions ADLs/Self Care Home Management;Cryotherapy;DME Instruction;Ultrasound;Moist Heat;Iontophoresis 4mg /ml Dexamethasone;Gait training;Stair training;Functional mobility training;Balance training;Therapeutic exercise;Therapeutic activities;Neuromuscular re-education;Patient/family education;Passive range of motion;Dry needling;Taping;Spinal Manipulations;Joint Manipulations;Vasopneumatic Device;Energy conservation    PT Home Exercise Plan [email or web address]    Consulted and Agree with Plan of Care Patient             Patient will benefit from skilled therapeutic intervention in order to improve the following deficits and impairments:  Pain, Increased muscle spasms, Decreased mobility, Decreased activity tolerance, Decreased range of motion, Decreased strength, Hypomobility, Impaired flexibility  Visit Diagnosis: Pain in right hip  Stiffness of right hip, not elsewhere classified  Stiffness of left hip, not elsewhere classified     Problem List Patient Active Problem List   Diagnosis Date Noted   Bilateral primary osteoarthritis of hip 12/04/2020   Essential hypertension 12/01/2019   Current use of long term anticoagulation 11/06/2018   Atrial fibrillation status post  cardioversion ([place]) 02/24/2017   Persistent atrial fibrillation ([place]) 11/21/2016   Benign prostatic hyperplasia without lower urinary tract symptoms 11/07/2016   Family history of colon cancer 10/01/2012   Herniated lumbar disc without myelopathy 07/08/2012   ED (erectile dysfunction) 01/07/2012   Actinic keratosis 01/07/2012   Seborrheic keratosis 01/07/2012   CAROTID BRUIT 08/24/2008   Mixed hyperlipidemia 08/13/2008   HOCM (hypertrophic obstructive cardiomyopathy) ([place]) 08/13/2008    Lyndee Hensen, PT, DPT 3:28 PM  12/11/20    [facility] [address] [place], [place], [address] Phone: [phone]   Fax:  [phone]  Name: Ronnie Black MRN: [id number] Date of Birth: 1945-11-27

## 2020-12-18 ENCOUNTER — Ambulatory Visit: Payer: Medicare PPO | Admitting: Physical Therapy

## 2020-12-18 ENCOUNTER — Encounter: Payer: Self-pay | Admitting: Physical Therapy

## 2020-12-18 ENCOUNTER — Other Ambulatory Visit: Payer: Self-pay

## 2020-12-18 DIAGNOSIS — M25551 Pain in right hip: Secondary | ICD-10-CM | POA: Diagnosis not present

## 2020-12-18 DIAGNOSIS — M25651 Stiffness of right hip, not elsewhere classified: Secondary | ICD-10-CM

## 2020-12-18 DIAGNOSIS — M25652 Stiffness of left hip, not elsewhere classified: Secondary | ICD-10-CM

## 2020-12-21 ENCOUNTER — Encounter: Payer: Self-pay | Admitting: Physical Therapy

## 2020-12-21 NOTE — Patient Instructions (Signed)
Access Code: [email or web address] URL: [email or web address] Date: 12/21/2020 Prepared by: Lyndee Hensen  Exercises Single Knee to Chest Stretch - 2 x daily - 3 reps - 30 hold Supine Piriformis Stretch Pulling Heel to Hip - 2 x daily - 3 reps - 30 hold Supine Lower Trunk Rotation - 2 x daily - 10 reps - 5 hold Seated Hamstring Stretch - 2 x daily - 3 reps - 30 hold Modified [place] Stretch - 2 x daily - 2 sets - 10 reps Supine Bridge - 1 x daily - 2 sets - 10 reps Hooklying Clamshell with Resistance - 1 x daily - 2 sets - 10 reps

## 2020-12-21 NOTE — Therapy (Signed)
[facility] [address] [place], [place], [address] Phone: [phone]   Fax:  [phone]  Physical Therapy Treatment/Discharge   Patient Details  Name: Ronnie Black MRN: [id number] Date of Birth: 06-26-1945 Referring Provider (PT): Dimas Chyle   Encounter Date: 12/18/2020   PT End of Session - 12/21/20 1249     Visit Number 5    Number of Visits 16    Date for PT Re-Evaluation 01/08/21    Authorization Type [employer]    PT Start Time 1435    PT Stop Time 9678    PT Time Calculation (min) 40 min    Activity Tolerance Patient tolerated treatment well    Behavior During Therapy [facility] for tasks assessed/performed             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   Persistent atrial fibrillation ([place])    s/p PVI 3/12    Past Surgical History:  Procedure Laterality Date   APPENDECTOMY     atrial fibrillation ablation  03/27/10   PVI with CTI ablation by [place] N/A 11/21/2016   Procedure: ATRIAL FIBRILLATION ABLATION;  Surgeon: [name], MD;  Location: [place] [facility];  Service: Cardiovascular;  Laterality: N/A;   CARDIOVERSION     CARDIOVERSION  04/26/2011   Procedure: CARDIOVERSION;  Surgeon: Larey Dresser, MD;  Location: [place];  Service: Cardiovascular;  Laterality: N/A;   CARDIOVERSION N/A 04/28/2012   Procedure: CARDIOVERSION;  Surgeon: Thayer Headings, MD;  Location: [place];  Service: Cardiovascular;  Laterality: N/A;   CARDIOVERSION N/A 07/28/2012   Procedure: CARDIOVERSION;  Surgeon: Lelon Perla, MD;  Location: [facility] ENDOSCOPY;  Service: Cardiovascular;  Laterality: N/A;   CARDIOVERSION N/A 08/14/2012   Procedure: CARDIOVERSION;  Surgeon: Josue Hector, MD;  Location: [place];  Service: Cardiovascular;  Laterality: N/A;   CARDIOVERSION N/A 08/11/2014   Procedure: CARDIOVERSION;  Surgeon: Fay Records, MD;  Location: [place];  Service: Cardiovascular;  Laterality: N/A;   CARDIOVERSION N/A 08/26/2014   Procedure: CARDIOVERSION;  Surgeon: Sanda Klein, MD;  Location: [employer];  Service: Cardiovascular;  Laterality: N/A;   CARDIOVERSION N/A 10/10/2016   Procedure: CARDIOVERSION;  Surgeon: Larey Dresser, MD;  Location: [place];  Service: Cardiovascular;  Laterality: N/A;   CARDIOVERSION N/A 12/04/2016   Procedure: CARDIOVERSION;  Surgeon: Larey Dresser, MD;  Location: [place];  Service: Cardiovascular;  Laterality: N/A;   CARDIOVERSION N/A 06/09/2018   Procedure: CARDIOVERSION;  Surgeon: Dorothy Spark, MD;  Location: [place];  Service: Cardiovascular;  Laterality: N/A;   CARDIOVERSION N/A 08/17/2019   Procedure: CARDIOVERSION;  Surgeon: [name], MD;  Location: [place];  Service: Cardiovascular;  Laterality: N/A;   CARDIOVERSION N/A 11/18/2019   Procedure: CARDIOVERSION;  Surgeon: [name], MD;  Location: [place];  Service: Cardiovascular;  Laterality: N/A;   CARDIOVERSION N/A 03/09/2020   Procedure: CARDIOVERSION;  Surgeon: [name], MD;  Location: [place];  Service: Cardiovascular;  Laterality: N/A;   SKIN CANCER EXCISION     TEE WITHOUT CARDIOVERSION  04/26/2011   Procedure: TRANSESOPHAGEAL ECHOCARDIOGRAM (TEE);  Surgeon: Larey Dresser, MD;  Location: [facility] ENDOSCOPY;  Service: Cardiovascular;  Laterality: N/A;  to be done at 1330  TEE WITHOUT CARDIOVERSION N/A 04/28/2012   Procedure: TRANSESOPHAGEAL ECHOCARDIOGRAM (TEE);  Surgeon: Thayer Headings, MD;  Location: [place];  Service: Cardiovascular;  Laterality: N/A;   WISDOM TOOTH EXTRACTION      There were no vitals filed for this visit.   Subjective Assessment - 12/21/20 1248     Subjective Pt states doing ok with hip. Was able  to do quite a bit of walking over the weekend, had some "stiffness " after walking more.    Patient Stated Goals decreased pain    Currently in Pain? Yes    Pain Score 2     Pain Location Hip    Pain Orientation Right    Pain Descriptors / Indicators Aching    Pain Type Acute pain    Pain Onset More than a month ago    Pain Frequency Intermittent                               [facility] Adult PT Treatment/Exercise - 12/21/20 0001       Lumbar Exercises: Stretches   Active Hamstring Stretch 3 reps;30 seconds    Active Hamstring Stretch Limitations seated    Single Knee to Chest Stretch 3 reps;30 seconds    Lower Trunk Rotation 5 reps;10 seconds    Hip Flexor Stretch 3 reps;30 seconds    Hip Flexor Stretch Limitations supine off side of table.    Piriformis Stretch 3 reps;30 seconds    Piriformis Stretch Limitations supine      Lumbar Exercises: Aerobic   Recumbent Bike L2 x 8 min;      Lumbar Exercises: Standing   Other Standing Lumbar Exercises march x 20, no UE support,  walk/march fwd and bwd 20 ft x 4;      Lumbar Exercises: Supine   Clam 15 reps    Clam Limitations Blue TB    Bridge 20 reps      Lumbar Exercises: Quadruped   [place]/Old Horse 20 reps      Manual Therapy   Manual Therapy Joint mobilization;Passive ROM    Joint Mobilization Hip inf and lateral  mobs g 3 with strap bil , Post mobs,  LAD for hips bil    Passive ROM manual stretching for hip flex and rotation bilaterally                     PT Education - 12/21/20 1248     Education Details Reviewed HEP and d/c plan    Person(s) Educated Patient    Methods Explanation;Demonstration;Tactile cues;Verbal cues;Handout    Comprehension Verbalized understanding;Returned demonstration;Verbal cues required;Tactile cues required              PT Short Term Goals - 12/04/20 1535       PT SHORT TERM GOAL #1   Title Pt to be independent with initial HEP    Time 2    Period  Weeks    Status Achieved    Target Date 11/27/20               PT Long Term Goals - 12/21/20 1249       PT LONG TERM GOAL #1   Title Pt to be independent with final HEP    Time 8    Period Weeks    Status Achieved      PT LONG TERM GOAL #2   Title Pt to report decreased pain  in R hip, to 0-2/10 with transfers, initial standing, and standing activity.    Time 8    Period Weeks    Status Achieved      PT LONG TERM GOAL #3   Title Pt to demo ability for bend, lift, squat, to lift object from floor, (with optimal mechanics) to improve ability for IADLS    Time 8    Period Weeks    Status Achieved                   Plan - 12/21/20 1249     Clinical Impression Statement Pt continues to have stiffness in bil hips, and mild soreness at times. He is doing very well managing soreness at this time, and doing well with exercise routine and HEP. Pt has info for ortho dr, and will make appt in the future as needed, if he has increased pain or diffiuculty. Pt ready for d/c to HEP, pt in agreement with plan. Final HEp reviewed today.    Examination-Activity Limitations Stand;Locomotion Level;Transfers;Squat;Stairs    Examination-Participation Restrictions Yard Work;Cleaning;Community Activity    Stability/Clinical Decision Making Stable/Uncomplicated    PT Frequency 2x / week    PT Duration 6 weeks    PT Treatment/Interventions ADLs/Self Care Home Management;Cryotherapy;DME Instruction;Ultrasound;Moist Heat;Iontophoresis 49m/ml Dexamethasone;Gait training;Stair training;Functional mobility training;Balance training;Therapeutic exercise;Therapeutic activities;Neuromuscular re-education;Patient/family education;Passive range of motion;Dry needling;Taping;Spinal Manipulations;Joint Manipulations;Vasopneumatic Device;Energy conservation    PT Home Exercise Plan [id number]   Consulted and Agree with Plan of Care Patient             Patient will benefit from skilled therapeutic  intervention in order to improve the following deficits and impairments:  Pain, Increased muscle spasms, Decreased mobility, Decreased activity tolerance, Decreased range of motion, Decreased strength, Hypomobility, Impaired flexibility  Visit Diagnosis: Pain in right hip  Stiffness of right hip, not elsewhere classified  Stiffness of left hip, not elsewhere classified     Problem List Patient Active Problem List   Diagnosis Date Noted   Bilateral primary osteoarthritis of hip 12/04/2020   Essential hypertension 12/01/2019   Current use of long term anticoagulation 11/06/2018   Atrial fibrillation status post cardioversion ([facility] 02/24/2017   Persistent atrial fibrillation ([facility] 11/21/2016   Benign prostatic hyperplasia without lower urinary tract symptoms 11/07/2016   Family history of colon cancer 10/01/2012   Herniated lumbar disc without myelopathy 07/08/2012   ED (erectile dysfunction) 01/07/2012   Actinic keratosis 01/07/2012   Seborrheic keratosis 01/07/2012   CAROTID BRUIT 08/24/2008   Mixed hyperlipidemia 08/13/2008   HOCM (hypertrophic obstructive cardiomyopathy) ([facility] 08/13/2008    LLyndee Hensen PT, DPT 12:52 PM  12/21/20    [facility] [place] [address]Phone: [phone]  Fax:  [phone] Name: CFINDLEY VIMRN: [id number] of Birth: [birth year]   PHYSICAL THERAPY DISCHARGE SUMMARY  Visits from Start of Care: 5 Plan: Patient agrees to discharge.  Patient goals were met. Patient is being discharged due to meeting the stated rehab goals.      LLyndee Hensen PT, DPT 12:52 PM  12/21/20

## 2021-01-12 ENCOUNTER — Telehealth (INDEPENDENT_AMBULATORY_CARE_PROVIDER_SITE_OTHER): Payer: Medicare PPO | Admitting: Physician Assistant

## 2021-01-12 VITALS — Ht 66.0 in

## 2021-01-12 DIAGNOSIS — U071 COVID-19: Secondary | ICD-10-CM

## 2021-01-12 MED ORDER — MOLNUPIRAVIR EUA 200MG CAPSULE: 4.0000 | ORAL_CAPSULE | Freq: Two times a day (BID) | ORAL | 0 refills | Status: AC

## 2021-01-12 NOTE — Progress Notes (Signed)
Virtual Visit via Video Note  I connected with  Ronnie Black  on 01/12/21 at 11:00 AM EST by a video enabled telemedicine application and verified that I am speaking with the correct person using two identifiers.  Location: Patient: home Provider: Therapist, music at [place] present: Patient and myself   I discussed the limitations of evaluation and management by telemedicine and the availability of in person appointments. The patient expressed understanding and agreed to proceed.   History of Present Illness:  Chief complaint: Flu-like symptoms Symptom onset: Yesterday (01/13/21) sudden onset Pertinent positives: Fatigue, body aching, ST, cough Pertinent negatives: Nasal congestion, SOB, CP, N/V/D Treatments tried: Nothing so far  Vaccine status: UTD on COVID-19 vaccines and boosters  Sick exposure: Exposed to COVID-19 earlier this week     Observations/Objective:  Temp 100.3 F  Gen: Awake, alert, no acute distress Resp: Breathing is even and non-labored Psych: calm/pleasant demeanor Neuro: Alert and Oriented x 3, + facial symmetry, speech is clear.   Assessment and Plan:  1. COVID-19 Most likely COVID-19 as he has had direct exposure earlier this week. We discussed current algorithm recommendations for prescribing outpatient antivirals. He is within 5 day window onset of symptoms and over the age of 55, antiviral therapy indicated. Risks versus benefits discussed. Agreed on Molnupiravir, possible side effects discussed. Advised self-isolation at home for the next 5 days and then masking around others for at least an additional 5 days.  Treat supportively at this time including sleeping prone, deep breathing exercises, pushing fluids, walking every few hours, vitamins C and D, and Tylenol or ibuprofen as needed.  The patient understands that COVID-19 illness can wax and wane.  Should the symptoms acutely worsen or patient starts to experience sudden  shortness of breath, chest pain, severe weakness, the patient will go straight to the emergency department.  Also advised home pulse oximetry monitoring and for any reading consistently under 92%, should also report to the emergency department.  The patient will continue to keep [name] updated.   Follow Up Instructions:    I discussed the assessment and treatment plan with the patient. The patient was provided an opportunity to ask questions and all were answered. The patient agreed with the plan and demonstrated an understanding of the instructions.   The patient was advised to call back or seek an in-person evaluation if the symptoms worsen or if the condition fails to improve as anticipated.  [name], PA-C

## 2021-01-16 ENCOUNTER — Ambulatory Visit (HOSPITAL_COMMUNITY): Payer: Medicare PPO | Admitting: Nurse Practitioner

## 2021-01-18 ENCOUNTER — Encounter: Payer: Self-pay | Admitting: Physician Assistant

## 2021-01-31 ENCOUNTER — Other Ambulatory Visit: Payer: Self-pay | Admitting: Urology

## 2021-01-31 DIAGNOSIS — R972 Elevated prostate specific antigen [PSA]: Secondary | ICD-10-CM

## 2021-02-13 ENCOUNTER — Other Ambulatory Visit: Payer: Self-pay

## 2021-02-13 ENCOUNTER — Ambulatory Visit (HOSPITAL_COMMUNITY)
Admission: RE | Admit: 2021-02-13 | Discharge: 2021-02-13 | Disposition: A | Discharge status: Home / Self Care | Payer: Medicare PPO | Source: Ambulatory Visit | Attending: Nurse Practitioner | Admitting: Nurse Practitioner

## 2021-02-13 VITALS — BP 140/74 | HR 72 | Ht 66.0 in | Wt 154.0 lb

## 2021-02-13 DIAGNOSIS — D6869 Other thrombophilia: Secondary | ICD-10-CM | POA: Diagnosis not present

## 2021-02-13 DIAGNOSIS — I4892 Unspecified atrial flutter: Secondary | ICD-10-CM | POA: Insufficient documentation

## 2021-02-13 DIAGNOSIS — Z7901 Long term (current) use of anticoagulants: Secondary | ICD-10-CM | POA: Insufficient documentation

## 2021-02-13 DIAGNOSIS — I4819 Other persistent atrial fibrillation: Secondary | ICD-10-CM | POA: Insufficient documentation

## 2021-02-13 LAB — BASIC METABOLIC PANEL
Anion gap: 10 (ref 5–15)
BUN: 16 mg/dL (ref 8–23)
CO2: 28 mmol/L (ref 22–32)
Calcium: 9.3 mg/dL (ref 8.9–10.3)
Chloride: 101 mmol/L (ref 98–111)
Creatinine, Ser: 0.92 mg/dL (ref 0.61–1.24)
GFR, Estimated: >60 mL/min (ref >60)
Glucose, Bld: 89 mg/dL (ref 70–99)
Potassium: 3.8 mmol/L (ref 3.5–5.1)
Sodium: 139 mmol/L (ref 135–145)

## 2021-02-13 LAB — MAGNESIUM: Magnesium: 2.2 mg/dL (ref 1.7–2.4)

## 2021-02-13 NOTE — Progress Notes (Signed)
Patient ID: Ronnie Black, male   DOB: 02-Oct-1945, 76 y.o.   MRN: [id number]     Primary Care Physician: [name], MD Referring Physician: Dr. Ron Parker EP: Dr. Hilliard Clark is a 76 y.o. male with a h/o afib, frequent cardioversions in  the past. He has had afib/flutter ablation in 2012  and afib ablation fall of 2018. He was on norpace at one time, changed to amiodarone which was stopped shortly after last ablation, then back to Norpace. Unfortunately, he continued to have symptomatic breakthrough episodes of afib and the decision was made to stop Norpace and load on dofetilide.   On follow up today, patient presents for dofetilide admission. He states he stopped Norpace 5 days ago and has not missed any doses of anticoagulation in the last 3 weeks. He is in SR today.    F/u in afib clinic, 06/01/20. He is one week s/p tikosyn load. He did well maintaining SR in the hospital and  was d/c on 500 mcg dofetilide  bid with stable qtc. He feels well and has been staying  in rhythm.   F/u in afib clinic, 02/13/21. He is staying in rhythm. Unfortunately, he lost his wife suddenly in July 2022. He is exercising regulary. He is compliant with his drugs.   Today, he denies symptoms of  palpitations, chest pain, shortness of breath, orthopnea, PND, lower extremity edema, dizziness, presyncope, syncope, or neurologic sequela.  .The patient is tolerating medications without difficulties and is otherwise without complaint today.   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   Persistent atrial fibrillation ([place])    s/p PVI 3/12   Past Surgical History:  Procedure Laterality Date   APPENDECTOMY     atrial fibrillation ablation  03/27/10   PVI with CTI ablation by [place] N/A  11/21/2016   Procedure: ATRIAL FIBRILLATION ABLATION;  Surgeon: [name], MD;  Location: [place] [facility];  Service: Cardiovascular;  Laterality: N/A;   CARDIOVERSION     CARDIOVERSION  04/26/2011   Procedure: CARDIOVERSION;  Surgeon: Larey Dresser, MD;  Location: [place];  Service: Cardiovascular;  Laterality: N/A;   CARDIOVERSION N/A 04/28/2012   Procedure: CARDIOVERSION;  Surgeon: Thayer Headings, MD;  Location: [place];  Service: Cardiovascular;  Laterality: N/A;   CARDIOVERSION N/A 07/28/2012   Procedure: CARDIOVERSION;  Surgeon: Lelon Perla, MD;  Location: [facility] ENDOSCOPY;  Service: Cardiovascular;  Laterality: N/A;   CARDIOVERSION N/A 08/14/2012   Procedure: CARDIOVERSION;  Surgeon: Josue Hector, MD;  Location: [place];  Service: Cardiovascular;  Laterality: N/A;   CARDIOVERSION N/A 08/11/2014   Procedure: CARDIOVERSION;  Surgeon: Fay Records, MD;  Location: [facility] ENDOSCOPY;  Service: Cardiovascular;  Laterality: N/A;   CARDIOVERSION N/A 08/26/2014   Procedure: CARDIOVERSION;  Surgeon: Sanda Klein, MD;  Location: [facility] ENDOSCOPY;  Service: Cardiovascular;  Laterality: N/A;   CARDIOVERSION N/A 10/10/2016   Procedure: CARDIOVERSION;  Surgeon: Larey Dresser, MD;  Location: [facility] ENDOSCOPY;  Service: Cardiovascular;  Laterality: N/A;   CARDIOVERSION N/A 12/04/2016   Procedure: CARDIOVERSION;  Surgeon: Larey Dresser, MD;  Location: [facility] ENDOSCOPY;  Service: Cardiovascular;  Laterality: N/A;   CARDIOVERSION N/A 06/09/2018   Procedure: CARDIOVERSION;  Surgeon: Dorothy Spark, MD;  Location: [place];  Service: Cardiovascular;  Laterality: N/A;   CARDIOVERSION N/A 08/17/2019   Procedure: CARDIOVERSION;  Surgeon: [name], MD;  Location: [place];  Service: Cardiovascular;  Laterality: N/A;   CARDIOVERSION N/A 11/18/2019   Procedure: CARDIOVERSION;  Surgeon: [name], MD;  Location: [place];  Service: Cardiovascular;  Laterality: N/A;    CARDIOVERSION N/A 03/09/2020   Procedure: CARDIOVERSION;  Surgeon: [name], MD;  Location: [place];  Service: Cardiovascular;  Laterality: N/A;   SKIN CANCER EXCISION     TEE WITHOUT CARDIOVERSION  04/26/2011   Procedure: TRANSESOPHAGEAL ECHOCARDIOGRAM (TEE);  Surgeon: Larey Dresser, MD;  Location: [facility] ENDOSCOPY;  Service: Cardiovascular;  Laterality: N/A;  to be done at 1330   TEE WITHOUT CARDIOVERSION N/A 04/28/2012   Procedure: TRANSESOPHAGEAL ECHOCARDIOGRAM (TEE);  Surgeon: Thayer Headings, MD;  Location: [facility] ENDOSCOPY;  Service: Cardiovascular;  Laterality: N/A;   WISDOM TOOTH EXTRACTION      Current Outpatient Medications  Medication Sig Dispense Refill   atorvastatin (LIPITOR) 10 MG tablet TAKE 1 TABLET BY MOUTH EVERY DAY 90 tablet 3   dofetilide (TIKOSYN) 500 MCG capsule TAKE 1 CAPSULE BY MOUTH 2 TIMES DAILY. 180 capsule 3   ELIQUIS 5 MG TABS tablet TAKE 1 TABLET BY MOUTH TWICE A DAY 180 tablet 1   melatonin 5 MG TABS Take 5 mg by mouth at bedtime.     metoprolol tartrate (LOPRESSOR) 25 MG tablet TAKE 1 TABLET BY MOUTH TWICE A DAY 180 tablet 2   Multiple Vitamin (MULTIVITAMIN WITH MINERALS) TABS tablet Take 1 tablet by mouth 3 (three) times a week.     sodium chloride (OCEAN) 0.65 % SOLN nasal spray Place 1 spray into both nostrils as needed (dryness).     No current facility-administered medications for this encounter.    No Known Allergies  Social History   Socioeconomic History   Marital status: Married    Spouse name: Not on file   Number of children: 2   Years of education: Not on file   Highest education level: Not on file  Occupational History   Occupation: professor    Employer: [employer]    Comment: retired   Tobacco Use   Smoking status: Former    Packs/day: 1.00    Years: 15.00    Pack years: 15.00    Types: Cigarettes    Quit date: 11/28/1975    Years since quitting: 45.2   Smokeless tobacco: Never  Vaping Use   Vaping Use: Never used   Substance and Sexual Activity   Alcohol use: Yes    Alcohol/week: 6.0 standard drinks    Types: 3 Glasses of wine, 3 Cans of beer per week   Drug use: No   Sexual activity: Yes  Other Topics Concern   Not on file  Social History Narrative    a professor of Therapist, occupational at [employer] in [place] @ [employer]    Social Determinants of Radio broadcast assistant Strain: Low Risk    Difficulty of Paying Living Expenses: Not hard at all  Food Insecurity: No Food Insecurity   Worried About Charity fundraiser in the Last Year: Never true   Arboriculturist in the Last Year: Never true  Transportation Needs: No Transportation Needs   Lack of Transportation (Medical): No   Lack of Transportation (Non-Medical): No  Physical Activity: Sufficiently Active   Days of Exercise per Week: 6 days  Minutes of Exercise per Session: 60 min  Stress: No Stress Concern Present   Feeling of Stress : Not at all  Social Connections: Moderately Isolated   Frequency of Communication with Friends and Family: Twice a week   Frequency of Social Gatherings with Friends and Family: Three times a week   Attends Religious Services: Never   Active Member of Clubs or Organizations: No   Attends Music therapist: Never   Marital Status: Married  Human resources officer Violence: Not At Risk   Fear of Current or Ex-Partner: No   Emotionally Abused: No   Physically Abused: No   Sexually Abused: No    Family History  Problem Relation Age of Onset   Coronary artery disease Other    Colon cancer Mother    Lung cancer Mother    Colon cancer Father    Early death Neg Hx    Hearing loss Neg Hx    Heart disease Neg Hx    Hyperlipidemia Neg Hx    Hypertension Neg Hx    Kidney disease Neg Hx    Stroke Neg Hx     ROS- All systems are reviewed and negative except as per the HPI above  Physical Exam: Vitals:   02/13/21 1426  BP: 140/74  Pulse: 72  Weight: 69.9 kg   Height: 5\' 6"  (1.676 m)    GEN- The patient is a well appearing elderly male, alert and oriented x 3 today.   HEENT-head normocephalic, atraumatic, sclera clear, conjunctiva pink, hearing intact, trachea midline. Lungs- Clear to ausculation bilaterally, normal work of breathing Heart- Regular rate and rhythm, no murmurs, rubs or gallops  GI- soft, NT, ND, + BS Extremities- no clubbing, cyanosis, or edema MS- no significant deformity or atrophy Skin- no rash or lesion Psych- euthymic mood, full affect Neuro- strength and sensation are intact  PR interval 190 ms QRS duration 96 ms QT/QTcB 412/451 ms P-R-T axes 81 76 267 Sinus rhythm with Premature atrial complexes with Abberant conduction RSR' or QR pattern in V1 suggests right ventricular conduction delay T wave abnormality, consider inferior ischemia T wave abnormality, consider anterolateral ischemia Abnormal ECG   Echo 11/23/19 1. The apical views are concerning for apical LV hypertrophy. . Left  ventricular ejection fraction, by estimation, is 60 to 65%. The left  ventricle has normal function. The left ventricle has no regional wall  motion abnormalities. There is severe apical   left ventricular hypertrophy. Left ventricular diastolic parameters are  indeterminate.   2. Right ventricular systolic function is normal. The right ventricular  size is normal.   3. The mitral valve is normal in structure. Mild mitral valve  regurgitation. No evidence of mitral stenosis.   4. The aortic valve is normal in structure. Aortic valve regurgitation is  not visualized. No aortic stenosis is present.    Assessment and Plan:  1. Persistent atrial fibrillation/atrial flutter  S/p ablation 03/2010 and 11/21/16 Patient presents for dofetilide loading hospitalization May 2022 Maintaining  SR, qt stable  Continue tikosyn 500 mcg bid  Continue Eliquis 5 mg BID  Reminded no  benadryl use chadsvasc score of 3  Continue metoprolol  tartrate  25 mg bid     2. HCM Nonobstructive   Pt would like to establish with Dr. Quentin Ore in lieu of Dr. [name] leaving  Appointment requested  F/u afib clinic for Tikosyn  surveillance in 3 months   Butch Penny C. Ingrid Shifrin, [facility] [address]  [place], [place] [address] [phone]

## 2021-02-16 ENCOUNTER — Encounter (HOSPITAL_COMMUNITY): Payer: Self-pay

## 2021-02-19 ENCOUNTER — Other Ambulatory Visit (HOSPITAL_COMMUNITY): Payer: Self-pay | Admitting: *Deleted

## 2021-02-19 DIAGNOSIS — I4819 Other persistent atrial fibrillation: Secondary | ICD-10-CM

## 2021-02-19 NOTE — Progress Notes (Signed)
error 

## 2021-02-21 ENCOUNTER — Other Ambulatory Visit (HOSPITAL_COMMUNITY): Payer: Medicare PPO | Admitting: Nurse Practitioner

## 2021-02-22 ENCOUNTER — Other Ambulatory Visit (HOSPITAL_COMMUNITY): Payer: Self-pay | Admitting: Nurse Practitioner

## 2021-02-22 DIAGNOSIS — I4819 Other persistent atrial fibrillation: Secondary | ICD-10-CM | POA: Diagnosis not present

## 2021-02-23 LAB — BASIC METABOLIC PANEL
BUN/Creatinine Ratio: 12 (ref 10–24)
BUN: 12 mg/dL (ref 8–27)
CO2: 24 mmol/L (ref 20–29)
Calcium: 9.9 mg/dL (ref 8.6–10.2)
Chloride: 100 mmol/L (ref 96–106)
Creatinine, Ser: 0.97 mg/dL (ref 0.76–1.27)
Glucose: 90 mg/dL (ref 70–99)
Potassium: 4.7 mmol/L (ref 3.5–5.2)
Sodium: 140 mmol/L (ref 134–144)
eGFR: 80 mL/min/{1.73_m2} (ref >59)

## 2021-02-28 ENCOUNTER — Ambulatory Visit
Admission: RE | Admit: 2021-02-28 | Discharge: 2021-02-28 | Disposition: A | Discharge status: Home / Self Care | Payer: Medicare PPO | Source: Ambulatory Visit | Attending: Urology | Admitting: Urology

## 2021-02-28 DIAGNOSIS — R972 Elevated prostate specific antigen [PSA]: Secondary | ICD-10-CM | POA: Diagnosis not present

## 2021-02-28 DIAGNOSIS — N323 Diverticulum of bladder: Secondary | ICD-10-CM | POA: Diagnosis not present

## 2021-02-28 DIAGNOSIS — N3289 Other specified disorders of bladder: Secondary | ICD-10-CM | POA: Diagnosis not present

## 2021-02-28 DIAGNOSIS — K5730 Diverticulosis of large intestine without perforation or abscess without bleeding: Secondary | ICD-10-CM | POA: Diagnosis not present

## 2021-02-28 MED ORDER — GADOBENATE DIMEGLUMINE 529 MG/ML IV SOLN
15.0000 mL | Freq: Once | INTRAVENOUS | Status: AC | PRN
  Administered 2021-02-28: 15 mL via INTRAVENOUS

## 2021-03-06 DIAGNOSIS — D2272 Melanocytic nevi of left lower limb, including hip: Secondary | ICD-10-CM | POA: Diagnosis not present

## 2021-03-06 DIAGNOSIS — D1801 Hemangioma of skin and subcutaneous tissue: Secondary | ICD-10-CM | POA: Diagnosis not present

## 2021-03-06 DIAGNOSIS — D225 Melanocytic nevi of trunk: Secondary | ICD-10-CM | POA: Diagnosis not present

## 2021-03-06 DIAGNOSIS — Z85828 Personal history of other malignant neoplasm of skin: Secondary | ICD-10-CM | POA: Diagnosis not present

## 2021-03-06 DIAGNOSIS — L812 Freckles: Secondary | ICD-10-CM | POA: Diagnosis not present

## 2021-03-06 DIAGNOSIS — L853 Xerosis cutis: Secondary | ICD-10-CM | POA: Diagnosis not present

## 2021-03-06 DIAGNOSIS — L821 Other seborrheic keratosis: Secondary | ICD-10-CM | POA: Diagnosis not present

## 2021-03-08 ENCOUNTER — Telehealth: Payer: Self-pay

## 2021-03-08 NOTE — Telephone Encounter (Signed)
° °  Pre-operative Risk Assessment    Patient Name: Ronnie Black  DOB: 02-11-1945 MRN: [id number]    Request for Surgical Clearance  Procedure:   MRI Fusion Biopsy   Date of Surgery:  Clearance 04/04/2021                          {  Surgeon:  Dr Preston Fleeting Surgeon's Group or Practice Name:  [facility] Phone number:  [phone] Fax number:  [phone]   Type of Clearance Requested:   - Medical  - Pharmacy:  Hold Apixaban (Eliquis)    Type of Anesthesia:  None   Additional requests/questions:   N/A  Job Founds T   03/08/2021, 1:11 PM

## 2021-03-08 NOTE — Telephone Encounter (Signed)
Patient with diagnosis of afib on Eliquis for anticoagulation.    Procedure: MRI fusion biopsy Date of procedure: 04/04/21  CHA2DS2-VASc Score = 3  This indicates a 3.2% annual risk of stroke. The patient's score is based upon: CHF History: 0 HTN History: 1 Diabetes History: 0 Stroke History: 0 Vascular Disease History: 0 Age Score: 2 Gender Score: 0  CrCl 10mL/min Platelet count 190K  Per office protocol, patient can hold Eliquis for 3 days prior to procedure.

## 2021-03-09 ENCOUNTER — Encounter: Payer: Self-pay | Admitting: Family Medicine

## 2021-03-09 DIAGNOSIS — H40013 Open angle with borderline findings, low risk, bilateral: Secondary | ICD-10-CM | POA: Diagnosis not present

## 2021-03-09 DIAGNOSIS — D3132 Benign neoplasm of left choroid: Secondary | ICD-10-CM | POA: Diagnosis not present

## 2021-03-09 DIAGNOSIS — H35033 Hypertensive retinopathy, bilateral: Secondary | ICD-10-CM | POA: Diagnosis not present

## 2021-03-09 DIAGNOSIS — H2513 Age-related nuclear cataract, bilateral: Secondary | ICD-10-CM | POA: Diagnosis not present

## 2021-03-09 DIAGNOSIS — H43813 Vitreous degeneration, bilateral: Secondary | ICD-10-CM | POA: Diagnosis not present

## 2021-03-09 DIAGNOSIS — D3131 Benign neoplasm of right choroid: Secondary | ICD-10-CM | POA: Diagnosis not present

## 2021-03-14 NOTE — Telephone Encounter (Signed)
Reached out to pt, got VM on each number, LMTCB on cell this time.

## 2021-03-14 NOTE — Telephone Encounter (Signed)
Patient returning call.

## 2021-03-14 NOTE — Telephone Encounter (Signed)
° °  Patient Name: Ronnie Black  DOB: 04-Dec-1945 MRN: [id number]  Primary Cardiologist: Dr. [name]  Chart reviewed as part of pre-operative protocol coverage. Patient returned call. I reached out to patient for update on how he is doing. The patient affirms he has been doing well without any new cardiac symptoms. Therefore, based on ACC/AHA guidelines, the patient would be at acceptable risk for the planned procedure without further cardiovascular testing. The patient was advised that if he develops new symptoms prior to surgery to contact our office to arrange for a follow-up visit, and he verbalized understanding.  Per pharmD, Per office protocol, patient can hold Eliquis for 3 days prior to procedure.    Will route this bundled recommendation to requesting provider via Epic fax function. Please call with questions.  Charlie Pitter, PA-C 03/14/2021, 2:30 PM

## 2021-03-23 DIAGNOSIS — M25551 Pain in right hip: Secondary | ICD-10-CM | POA: Diagnosis not present

## 2021-04-01 NOTE — Progress Notes (Deleted)
Saw Ronnie Black 02/13/2021 ?AF/AFL ablation 2012, 2018 ? ?Also with HCM, nonobstructive ? ?Amio and dofetilide in the past ? ?11/2019 echo ?EF normal ?Severe apical HCM ?RV normal ?Mild MR ? ? ?Visiting to establish ? ?AF clinic, me annually ? ?

## 2021-04-03 ENCOUNTER — Encounter: Payer: Self-pay | Admitting: Cardiology

## 2021-04-03 ENCOUNTER — Other Ambulatory Visit: Payer: Self-pay

## 2021-04-03 ENCOUNTER — Ambulatory Visit: Payer: Medicare PPO | Admitting: Cardiology

## 2021-04-03 VITALS — BP 130/76 | HR 72 | Ht 66.0 in | Wt 156.0 lb

## 2021-04-03 DIAGNOSIS — I4819 Other persistent atrial fibrillation: Secondary | ICD-10-CM | POA: Diagnosis not present

## 2021-04-03 DIAGNOSIS — Z79899 Other long term (current) drug therapy: Secondary | ICD-10-CM

## 2021-04-03 DIAGNOSIS — I422 Other hypertrophic cardiomyopathy: Secondary | ICD-10-CM

## 2021-04-03 NOTE — Progress Notes (Signed)
?Electrophysiology Office Note:   ? ?Date:  04/03/2021  ? ?ID:  Ronnie Black, DOB [birth year], MRN [id number] ? ?PCP:  [name], MD  ?[facility] Cardiologist:  None  ?[facility] Electrophysiologist:  Vickie Epley, MD  ? ?Referring MD: [name], MD  ? ?Chief Complaint: Follow-up ? ?History of Present Illness:   ? ?Ronnie Black is a 76 y.o. male who presents for follow-up. Their medical history includes persistent atrial fibrillation s/p PVI, nonobstructive HCM, hypertension, hyperlipidemia, and degenerative disc disease. ? ?He saw Roderic Palau, NP on 02/13/2021. He was in sinus rhythm and remained compliant with his regimen. ? ?Afib/Aflutter ablation performed in 2012 and 2018. Frequent cardioversions. ? ?Previously followed by Dr. [name] since 2011. His atrial fibrillation became more persistent at this time. Prior to this he only had isolated episodes of atrial fibrillation. ? ?Overall, he appears well. In late 2021 his episodes seemed to be worsening again. He was started on dofetilide and since then he has generally been doing well. When he is in atrial fibrillation he states he is only mildly bothered by his symptoms. ? ?He recently discovered his at home blood pressure device is inaccurate. He has noticed readings as high as 160s-170s. His blood pressure is controlled in clinic today at 130/76. ? ?He remains compliant with Eliquis. He has noticed easy bruising but denies bleeding easily. He is currently off of Eliquis for 3 days due to a biopsy scheduled for tomorrow. ? ?Typically he exercises often with no exertional symptoms. While completing activities around the house his HR increases to the 90s. While exercising, his HR averages 115 bpm.  ? ?He continues to maintain a healthy diet. ? ?He denies any chest pain, shortness of breath, or peripheral edema. No lightheadedness, headaches, syncope, orthopnea, or PND. ? ? ?  ?Past Medical History:  ?Diagnosis Date  ? Current use of  long term anticoagulation 11/06/2018  ? DDD (degenerative disc disease)   ? HLD (hyperlipidemia)   ? Hypertension   ? 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 Persistent atrial fibrillation ([place])   ? s/p PVI 3/12  ? ? ?Past Surgical History:  ?Procedure Laterality Date  ? APPENDECTOMY    ? atrial fibrillation ablation  03/27/10  ? PVI with CTI ablation by JA  ? ATRIAL FIBRILLATION ABLATION N/A 11/21/2016  ? Procedure: ATRIAL FIBRILLATION ABLATION;  Surgeon: [name], MD;  Location: [place] [facility];  Service: Cardiovascular;  Laterality: N/A;  ? CARDIOVERSION    ? CARDIOVERSION  04/26/2011  ? Procedure: CARDIOVERSION;  Surgeon: Larey Dresser, MD;  Location: [place];  Service: Cardiovascular;  Laterality: N/A;  ? CARDIOVERSION N/A 04/28/2012  ? Procedure: CARDIOVERSION;  Surgeon: Thayer Headings, MD;  Location: [place];  Service: Cardiovascular;  Laterality: N/A;  ? CARDIOVERSION N/A 07/28/2012  ? Procedure: CARDIOVERSION;  Surgeon: Lelon Perla, MD;  Location: [place];  Service: Cardiovascular;  Laterality: N/A;  ? CARDIOVERSION N/A 08/14/2012  ? Procedure: CARDIOVERSION;  Surgeon: Josue Hector, MD;  Location: [place];  Service: Cardiovascular;  Laterality: N/A;  ? CARDIOVERSION N/A 08/11/2014  ? Procedure: CARDIOVERSION;  Surgeon: Fay Records, MD;  Location: [place];  Service: Cardiovascular;  Laterality: N/A;  ? CARDIOVERSION N/A 08/26/2014  ? Procedure: CARDIOVERSION;  Surgeon: Sanda Klein, MD;  Location: [place] ENDOSCOPY;  Service: Cardiovascular;  Laterality: N/A;  ? CARDIOVERSION N/A 10/10/2016  ? Procedure: CARDIOVERSION;  Surgeon: Larey Dresser, MD;  Location: [facility]COPY;  Service: Cardiovascular;  Laterality: N/A;  ? CARDIOVERSION N/A 12/04/2016  ? Procedure: CARDIOVERSION;  Surgeon: Larey Dresser, MD;  Location: [facility] ENDOSCOPY;  Service: Cardiovascular;  Laterality: N/A;  ? CARDIOVERSION N/A 06/09/2018  ? Procedure: CARDIOVERSION;  Surgeon: Dorothy Spark, MD;  Location: [place];  Service: Cardiovascular;  Laterality: N/A;  ? CARDIOVERSION N/A 08/17/2019  ? Procedure: CARDIOVERSION;  Surgeon: [name], MD;  Location: [place];  Service: Cardiovascular;  Laterality: N/A;  ? CARDIOVERSION N/A 11/18/2019  ? Procedure: CARDIOVERSION;  Surgeon: [name], MD;  Location: [facility] ENDOSCOPY;  Service: Cardiovascular;  Laterality: N/A;  ? CARDIOVERSION N/A 03/09/2020  ? Procedure: CARDIOVERSION;  Surgeon: [name], MD;  Location: [facility] ENDOSCOPY;  Service: Cardiovascular;  Laterality: N/A;  ? SKIN CANCER EXCISION    ? TEE WITHOUT CARDIOVERSION  04/26/2011  ? Procedure: TRANSESOPHAGEAL ECHOCARDIOGRAM (TEE);  Surgeon: Larey Dresser, MD;  Location: [facility] END[facility]COPY;  Service: Cardiovascular;  Laterality: N/A;  to be done at 1330  ? TEE WITHOUT CARDIOVERSION N/A 04/28/2012  ? Procedure: TRANSESOPHAGEAL ECHOCARDIOGRAM (TEE);  Surgeon: Thayer Headings, MD;  Location: [place];  Service: Cardiovascular;  Laterality: N/A;  ? WISDOM TOOTH EXTRACTION    ? ? ?Current Medications: ?Current Meds  ?Medication Sig  ? amoxicillin-clavulanate (AUGMENTIN) 500-125 MG tablet Take by mouth.  ? atorvastatin (LIPITOR) 10 MG tablet TAKE 1 TABLET BY MOUTH EVERY DAY  ? dofetilide (TIKOSYN) 500 MCG capsule TAKE 1 CAPSULE BY MOUTH 2 TIMES DAILY.  ? ELIQUIS 5 MG TABS tablet TAKE 1 TABLET BY MOUTH TWICE A DAY  ? melatonin 5 MG TABS Take 5 mg by mouth at bedtime.  ? metoprolol tartrate (LOPRESSOR) 25 MG tablet TAKE 1 TABLET BY MOUTH TWICE A DAY  ? Multiple Vitamin (MULTIVITAMIN WITH MINERALS) TABS tablet Take 1 tablet by mouth 3 (three) times a week.  ? sodium chloride (OCEAN) 0.65 % SOLN nasal spray Place 1 spray into both nostrils as needed (dryness).  ? [DISCONTINUED] levofloxacin (LEVAQUIN) 750 MG tablet Take by mouth.  ? [DISCONTINUED] Omega-3 Fatty Acids (FISH OIL) 1000 MG CAPS SMARTSIG:1 By Mouth  ? [DISCONTINUED] PRADAXA 150 MG CAPS capsule   ?  ? ?Allergies:    Patient has no known allergies.  ? ?Social History  ? ?Socioeconomic History  ? Marital status: Married  ?  Spouse name: Not on file  ? Number of children: 2  ? Years of education: Not on file  ? Highest education level: Not on file  ?Occupational History  ? Occupation: professor  ?  Employer: [employer]  ?  Comment: retired   ?Tobacco Use  ? Smoking status: Former  ?  Packs/day: 1.00  ?  Years: 15.00  ?  Pack years: 15.00  ?  Types: Cigarettes  ?  Quit date: 11/28/1975  ?  Years since quitting: 45.3  ? Smokeless tobacco: Never  ?Vaping Use  ? Vaping Use: Never used  ?Substance and Sexual Activity  ? Alcohol use: Yes  ?  Alcohol/week: 6.0 standard drinks  ?  Types: 3 Glasses of wine, 3 Cans of beer per week  ? Drug use: No  ? Sexual activity: Yes  ?Other Topics Concern  ? Not on file  ?Social History Narrative  ?  a professor of Therapist, occupational at [employer]  ? Lives in [place] @ [employer] retirement [facility]   ? ?Social Determinants of Health  ? ?Financial Resource Strain: Low Risk   ? Difficulty of  Paying Living Expenses: Not hard at all  ?Food Insecurity: No Food Insecurity  ? Worried About Charity fundraiser in the Last Year: Never true  ? Ran Out of Food in the Last Year: Never true  ?Transportation Needs: No Transportation Needs  ? Lack of Transportation (Medical): No  ? Lack of Transportation (Non-Medical): No  ?Physical Activity: Sufficiently Active  ? Days of Exercise per Week: 6 days  ? Minutes of Exercise per Session: 60 min  ?Stress: No Stress Concern Present  ? Feeling of Stress : Not at all  ?Social Connections: Moderately Isolated  ? Frequency of Communication with Friends and Family: Twice a week  ? Frequency of Social Gatherings with Friends and Family: Three times a week  ? Attends Religious Services: Never  ? Active Member of Clubs or Organizations: No  ? Attends Archivist Meetings: Never  ? Marital Status: Married  ?  ? ?Family History: ?The patient's family history includes  Colon cancer in his father and mother; Coronary artery disease in an other family member; Lung cancer in his mother. There is no history of Early death, Hearing loss, Heart disease, Hyperlipidemia, Hypertension, K

## 2021-04-03 NOTE — Patient Instructions (Signed)
Medication Instructions:  ?Your physician recommends that you continue on your current medications as directed. Please refer to the Current Medication list given to you today. ? ?*If you need a refill on your cardiac medications before your next appointment, please call your pharmacy* ? ? ?Lab Work: ?None ?If you have labs (blood work) drawn today and your tests are completely normal, you will receive your results only by: ?MyChart Message (if you have MyChart) OR ?A paper copy in the mail ?If you have any lab test that is abnormal or we need to change your treatment, we will call you to review the results. ? ?Follow-Up: ?At [facility], you and your health needs are our priority.  As part of our continuing mission to provide you with exceptional heart care, we have created designated Provider Care Teams.  These Care Teams include your primary Cardiologist (physician) and Advanced Practice Providers (APPs -  Physician Assistants and Nurse Practitioners) who all work together to provide you with the care you need, when you need it. ? ?We recommend signing up for the patient portal called "MyChart".  Sign up information is provided on this After Visit Summary.  MyChart is used to connect with patients for Virtual Visits (Telemedicine).  Patients are able to view lab/test results, encounter notes, upcoming appointments, etc.  Non-urgent messages can be sent to your provider as well.   ?To learn more about what you can do with MyChart, go to [email or web address].   ? ?Your next appointment:   ?1 year(s) ? ?The format for your next appointment:   ?In Person ? ?Provider:   ?Lars Mage, MD   ?

## 2021-04-04 DIAGNOSIS — C61 Malignant neoplasm of prostate: Secondary | ICD-10-CM | POA: Diagnosis not present

## 2021-04-04 DIAGNOSIS — R972 Elevated prostate specific antigen [PSA]: Secondary | ICD-10-CM | POA: Diagnosis not present

## 2021-04-04 DIAGNOSIS — D075 Carcinoma in situ of prostate: Secondary | ICD-10-CM | POA: Diagnosis not present

## 2021-04-05 ENCOUNTER — Other Ambulatory Visit: Payer: Self-pay | Admitting: Internal Medicine

## 2021-04-05 NOTE — Telephone Encounter (Signed)
Prescription refill request for Eliquis received. ? ?Indication: afib  ?Last office visit: [place], 04/03/2021 ?Scr: 0.97, 02/22/21 ?Age: 76 yo  ?Weight: 70.8 kg  ? ?Refill sent.  ?

## 2021-04-19 DIAGNOSIS — C61 Malignant neoplasm of prostate: Secondary | ICD-10-CM | POA: Diagnosis not present

## 2021-04-24 DIAGNOSIS — C61 Malignant neoplasm of prostate: Secondary | ICD-10-CM | POA: Diagnosis not present

## 2021-04-24 DIAGNOSIS — N1330 Unspecified hydronephrosis: Secondary | ICD-10-CM | POA: Diagnosis not present

## 2021-05-01 ENCOUNTER — Encounter: Payer: Self-pay | Admitting: Family Medicine

## 2021-05-01 ENCOUNTER — Ambulatory Visit: Payer: Medicare PPO | Admitting: Family Medicine

## 2021-05-01 VITALS — BP 148/82 | HR 70 | Temp 97.7°F | Ht 66.0 in | Wt 154.0 lb

## 2021-05-01 DIAGNOSIS — L290 Pruritus ani: Secondary | ICD-10-CM | POA: Insufficient documentation

## 2021-05-01 DIAGNOSIS — I4891 Unspecified atrial fibrillation: Secondary | ICD-10-CM

## 2021-05-01 DIAGNOSIS — R040 Epistaxis: Secondary | ICD-10-CM | POA: Insufficient documentation

## 2021-05-01 MED ORDER — FLUTICASONE PROPIONATE 0.005 % EX OINT: 1.0000 "application " | TOPICAL_OINTMENT | Freq: Two times a day (BID) | CUTANEOUS | 0 refills | Status: DC

## 2021-05-01 NOTE — Progress Notes (Signed)
? ?  Ronnie Black is a 76 y.o. male who presents today for an office visit. ? ?Assessment/Plan:  ?Chronic Problems Addressed Today: ?Epistaxis ?Cautery via silver nitrate performed today.  See below procedure note.  He tolerated well.  Also recommended use of Afrin in case this returns.  We will place referral to ENT for further management evaluation. ? 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  He has been on this for years and has done well.  He uses only very sparingly as needed. ? ?Atrial fibrillation ?On Eliquis which is contributing to his nosebleeds. ? ?  ?Subjective:  ?HPI: ? ?Patient here with nosebleeds. Started about a month ago though has been a recurrent issue in the past. Located on left nostril. He notes he had severe episodes in the past month. Usually last 20-30 minutes. His recent nosebleed was 2 weeks ago. [place] is going out of the country and is concern about this issue. He denies any nosebleed in right nostril.  ? ?He also requests refill on fluticasone ointment.  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 ? ?   ?  ?Objective:  ?Physical Exam: ?BP (!) 148/82 (BP Location: Left Arm)   Pulse 70   Temp 97.7 ?F (36.5 ?C) (Temporal)   Ht '5\' 6"'$  (1.676 m)   Wt 154 lb (69.9 kg)   SpO2 100%   BMI 24.86 kg/m?   ?Gen: No acute distress, resting comfortably ?HEENT: Kiesselbach's plexus noted on left with small amount of dried blood noted.  No signs of major active bleeding noted.  No obvious trauma. ?Neuro: Grossly normal, moves all extremities ?Psych: Normal affect and thought content ? ?Procedure Note ?Verbal consent was obtained after explaining the procedure including alternatives, risk, benefits.  The above area of bleeding was easily identified via otoscope with speculum.  Area was cauterized with topical silver nitrate.  Visible eschar was present after cauterization.  He tolerated well without complication. Hemostasis was observed at the end of the  procedure. ? ? ? ? ? ?   ? ? ?I,Savera Zaman,acting as a Education administrator for Dimas Chyle, MD.,have documented all relevant documentation on the behalf of Dimas Chyle, MD,as directed by  Dimas Chyle, MD while in the presence of Dimas Chyle, MD.  ? ?I, Dimas Chyle, MD, have reviewed all documentation for this visit. The documentation on 05/01/21 for the exam, diagnosis, procedures, and orders are all accurate and complete. ? ?[name]. [name], MD ?05/01/2021 2:13 PM  ? ?

## 2021-05-01 NOTE — Patient Instructions (Signed)
It was very nice to see you today! ? ?We cauterized the open area in your nose.  I hope this will prevent you from having bleeding in the future.  Please get a bottle of Afrin or oxymetazoline in case you have recurrence in the future.  I will refer you to see ENT. ? ?I will refill your fluticasone cream. ? ?Take care, ?Dr [name] ? ?PLEASE NOTE: ? ?If you had any lab tests please let us know if you have not heard back within a few days. You may see your results on mychart before we have a chance to review them but we will give you a call once they are reviewed by [name]. If we ordered any referrals today, please let us know if you have not heard from their office within the next week.  ? ?Please try these tips to maintain a healthy lifestyle: ? ?Eat at least 3 REAL meals and 1-2 snacks per day.  Aim for no more than 5 hours between eating.  If you eat breakfast, please do so within one hour of getting up.  ? ?Each meal should contain half fruits/vegetables, one quarter protein, and one quarter carbs (no bigger than a computer mouse) ? ?Cut down on sweet beverages. This includes juice, soda, and sweet tea.  ? ?Drink at least 1 glass of water with each meal and aim for at least 8 glasses per day ? ?Exercise at least 150 minutes every week.   ?

## 2021-05-07 DIAGNOSIS — C61 Malignant neoplasm of prostate: Secondary | ICD-10-CM | POA: Diagnosis not present

## 2021-05-08 ENCOUNTER — Encounter: Payer: Self-pay | Admitting: Family Medicine

## 2021-05-08 DIAGNOSIS — Z8546 Personal history of malignant neoplasm of prostate: Secondary | ICD-10-CM | POA: Insufficient documentation

## 2021-05-08 DIAGNOSIS — C61 Malignant neoplasm of prostate: Secondary | ICD-10-CM

## 2021-05-08 HISTORY — DX: Malignant neoplasm of prostate: C61

## 2021-05-10 NOTE — Progress Notes (Signed)
GU Location of Tumor / Histology: Prostate Ca ? ?If Prostate Cancer, Gleason Score is (4 + 3) and PSA is (6.7 as of 11/2020) ? ?Biopsies: ?Dr. [name] ? ? ? ? ?Past/Anticipated interventions by urology, if any:  ? ? ? ?Past/Anticipated interventions by medical oncology, if any: NA ? ?Weight changes, if any:  No ? ?IPSS:  6 ?SHIM:  11 ? ?Bowel/Bladder complaints, if any:  No ? ?Nausea/Vomiting, if any: No ? ?Pain issues, if any:  0/10 ? ?SAFETY ISSUES: ?Prior radiation?  No ?Pacemaker/ICD? No ?Possible current pregnancy? Male ?Is the patient on methotrexate?  No ? ?Current Complaints / other details:  Seasonal Allergies. ?

## 2021-05-15 ENCOUNTER — Ambulatory Visit (HOSPITAL_COMMUNITY)
Admission: RE | Admit: 2021-05-15 | Discharge: 2021-05-15 | Disposition: A | Discharge status: Home / Self Care | Payer: Medicare PPO | Source: Ambulatory Visit | Attending: Nurse Practitioner | Admitting: Nurse Practitioner

## 2021-05-15 ENCOUNTER — Other Ambulatory Visit (HOSPITAL_COMMUNITY): Payer: Self-pay | Admitting: Nurse Practitioner

## 2021-05-15 ENCOUNTER — Encounter (HOSPITAL_COMMUNITY): Payer: Self-pay | Admitting: Nurse Practitioner

## 2021-05-15 VITALS — BP 138/76 | HR 82 | Ht 66.0 in | Wt 154.6 lb

## 2021-05-15 DIAGNOSIS — Z7901 Long term (current) use of anticoagulants: Secondary | ICD-10-CM | POA: Diagnosis not present

## 2021-05-15 DIAGNOSIS — I4892 Unspecified atrial flutter: Secondary | ICD-10-CM | POA: Insufficient documentation

## 2021-05-15 DIAGNOSIS — Z79899 Other long term (current) drug therapy: Secondary | ICD-10-CM | POA: Insufficient documentation

## 2021-05-15 DIAGNOSIS — D6869 Other thrombophilia: Secondary | ICD-10-CM | POA: Diagnosis not present

## 2021-05-15 DIAGNOSIS — I4819 Other persistent atrial fibrillation: Secondary | ICD-10-CM

## 2021-05-15 LAB — BASIC METABOLIC PANEL
Anion gap: 8 (ref 5–15)
BUN: 11 mg/dL (ref 8–23)
CO2: 28 mmol/L (ref 22–32)
Calcium: 9.4 mg/dL (ref 8.9–10.3)
Chloride: 100 mmol/L (ref 98–111)
Creatinine, Ser: 0.83 mg/dL (ref 0.61–1.24)
GFR, Estimated: >60 mL/min (ref >60)
Glucose, Bld: 93 mg/dL (ref 70–99)
Potassium: 3.9 mmol/L (ref 3.5–5.1)
Sodium: 136 mmol/L (ref 135–145)

## 2021-05-15 LAB — MAGNESIUM: Magnesium: 2.2 mg/dL (ref 1.7–2.4)

## 2021-05-15 NOTE — Progress Notes (Signed)
Patient ID: Ronnie MARTINEK, male   DOB: 1945/11/26, 76 y.o.   MRN: [id number] ? ? ? ? ?Primary Care Physician: [name], MD ?Referring Physician: Dr. Ron Parker ?EP: Dr. Quentin Ore ? ? ?Ronnie Black is a 76 y.o. male with a h/o afib, frequent cardioversions in  the past. He has had afib/flutter ablation in 2012  and afib ablation fall of 2018. He was on norpace at one time, changed to amiodarone which was stopped shortly after last ablation, then back to Norpace. Unfortunately, he continued to have symptomatic breakthrough episodes of afib and the decision was made to stop Norpace and load on dofetilide.  ? ?On follow up today, patient presents for dofetilide admission. He states he stopped Norpace 5 days ago and has not missed any doses of anticoagulation in the last 3 weeks. He is in SR today.   ? ?F/u in afib clinic, 06/01/20. He is one week s/p tikosyn load. He did well maintaining SR in the hospital and  was d/c on 500 mcg dofetilide  bid with stable qtc. He feels well and has been staying  in rhythm.  ? ?F/u in afib clinic, 02/13/21. He is staying in rhythm. Unfortunately, he lost his wife suddenly in July 2022. He is exercising regulary. He is compliant with his drugs.  ? ?F/u in afib clinic, 05/14/22. He is here for Tikosyn surveillance. He has established with Dr. Quentin Ore as of March of this year with  Dr. [name] leaving the practice.  He is doing well staying in [place]. Compliant with Tikosyn.  ? ? ?Today, he denies symptoms of  palpitations, chest pain, shortness of breath, orthopnea, PND, lower extremity edema, dizziness, presyncope, syncope, or neurologic sequela.  .The patient is tolerating medications without difficulties and is otherwise without complaint today.  ? ?Past Medical History:  ?Diagnosis Date  ? Current use of long term anticoagulation 11/06/2018  ? DDD (degenerative disc disease)   ? HLD (hyperlipidemia)   ? Hypertension   ? 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  Persistent atrial fibrillation ([place])   ? s/p PVI 3/12  ? Prostate cancer ([place]) 05/08/2021  ? 04/2021  ? ?Past Surgical History:  ?Procedure Laterality Date  ? APPENDECTOMY    ? atrial fibrillation ablation  03/27/10  ? PVI with CTI ablation by JA  ? ATRIAL FIBRILLATION ABLATION N/A 11/21/2016  ? Procedure: ATRIAL FIBRILLATION ABLATION;  Surgeon: [name], MD;  Location: [place] CV LAB;  Service: Cardiovascular;  Laterality: N/A;  ? CARDIOVERSION    ? CARDIOVERSION  04/26/2011  ? Procedure: CARDIOVERSION;  Surgeon: Larey Dresser, MD;  Location: [place];  Service: Cardiovascular;  Laterality: N/A;  ? CARDIOVERSION N/A 04/28/2012  ? Procedure: CARDIOVERSION;  Surgeon: Thayer Headings, MD;  Location: [place];  Service: Cardiovascular;  Laterality: N/A;  ? CARDIOVERSION N/A 07/28/2012  ? Procedure: CARDIOVERSION;  Surgeon: Lelon Perla, MD;  Location: [place];  Service: Cardiovascular;  Laterality: N/A;  ? CARDIOVERSION N/A 08/14/2012  ? Procedure: CARDIOVERSION;  Surgeon: Josue Hector, MD;  Location: [place];  Service: Cardiovascular;  Laterality: N/A;  ? CARDIOVERSION N/A 08/11/2014  ? Procedure: CARDIOVERSION;  Surgeon: Fay Records, MD;  Location: [place];  Service: Cardiovascular;  Laterality: N/A;  ? CARDIOVERSION N/A 08/26/2014  ? Procedure: CARDIOVERSION;  Surgeon: Sanda Klein, MD;  Location: [place] ENDOSCOPY;  Service: Cardiovascular;  Laterality: N/A;  ? CARDIOVERSION N/A 10/10/2016  ? Procedure: CARDIOVERSION;  Surgeon: Loralie Champagne  S, MD;  Location: [place];  Service: Cardiovascular;  Laterality: N/A;  ? CARDIOVERSION N/A 12/04/2016  ? Procedure: CARDIOVERSION;  Surgeon: Larey Dresser, MD;  Location: [facility] ENDOSCOPY;  Service: Cardiovascular;  Laterality: N/A;  ? CARDIOVERSION N/A 06/09/2018  ? Procedure: CARDIOVERSION;  Surgeon: Dorothy Spark, MD;  Location: [place];  Service: Cardiovascular;  Laterality: N/A;  ? CARDIOVERSION N/A 08/17/2019  ? Procedure:  CARDIOVERSION;  Surgeon: [name], MD;  Location: [place];  Service: Cardiovascular;  Laterality: N/A;  ? CARDIOVERSION N/A 11/18/2019  ? Procedure: CARDIOVERSION;  Surgeon: [name], MD;  Location: [facility] ENDOSCOPY;  Service: Cardiovascular;  Laterality: N/A;  ? CARDIOVERSION N/A 03/09/2020  ? Procedure: CARDIOVERSION;  Surgeon: [name], MD;  Location: [facility] ENDOSCOPY;  Service: Cardiovascular;  Laterality: N/A;  ? SKIN CANCER EXCISION    ? TEE WITHOUT CARDIOVERSION  04/26/2011  ? Procedure: TRANSESOPHAGEAL ECHOCARDIOGRAM (TEE);  Surgeon: Larey Dresser, MD;  Location: [facility] ENDOSCOPY;  Service: Cardiovascular;  Laterality: N/A;  to be done at 1330  ? TEE WITHOUT CARDIOVERSION N/A 04/28/2012  ? Procedure: TRANSESOPHAGEAL ECHOCARDIOGRAM (TEE);  Surgeon: Thayer Headings, MD;  Location: [place];  Service: Cardiovascular;  Laterality: N/A;  ? WISDOM TOOTH EXTRACTION    ? ? ?Current Outpatient Medications  ?Medication Sig Dispense Refill  ? atorvastatin (LIPITOR) 10 MG tablet TAKE 1 TABLET BY MOUTH EVERY DAY 90 tablet 3  ? dofetilide (TIKOSYN) 500 MCG capsule TAKE 1 CAPSULE BY MOUTH 2 TIMES DAILY. 180 capsule 3  ? ELIQUIS 5 MG TABS tablet TAKE 1 TABLET BY MOUTH TWICE A DAY 180 tablet 1  ? fluticasone (CUTIVATE) 0.005 % ointment Apply 1 application. topically 2 (two) times daily. 30 g 0  ? melatonin 5 MG TABS Take 5 mg by mouth at bedtime.    ? metoprolol tartrate (LOPRESSOR) 25 MG tablet TAKE 1 TABLET BY MOUTH TWICE A DAY 180 tablet 2  ? Multiple Vitamin (MULTIVITAMIN WITH MINERALS) TABS tablet Take 1 tablet by mouth 3 (three) times a week.    ? sodium chloride (OCEAN) 0.65 % SOLN nasal spray Place 1 spray into both nostrils as needed (dryness).    ? ?No current facility-administered medications for this encounter.  ? ? ?No Known Allergies ? ?Social History  ? ?Socioeconomic History  ? Marital status: Widowed  ?  Spouse name: Not on file  ? Number of children: 2  ? Years of education: Not on file   ? Highest education level: Not on file  ?Occupational History  ? Occupation: professor  ?  Employer: [employer]  ?  Comment: retired   ?Tobacco Use  ? Smoking status: Former  ?  Packs/day: 1.00  ?  Years: 15.00  ?  Pack years: 15.00  ?  Types: Cigarettes  ?  Quit date: 11/28/1975  ?  Years since quitting: 45.4  ? Smokeless tobacco: Never  ?Vaping Use  ? Vaping Use: Never used  ?Substance and Sexual Activity  ? Alcohol use: Yes  ?  Alcohol/week: 6.0 standard drinks  ?  Types: 3 Glasses of wine, 3 Cans of beer per week  ? Drug use: No  ? Sexual activity: Yes  ?Other Topics Concern  ? Not on file  ?Social History Narrative  ?  a professor of Therapist, occupational at [employer]  ? 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 ?Social Determinants of Health  ? ?Financial Resource Strain: Low Risk   ? Difficulty of Paying Living  Expenses: Not hard at all  ?Food Insecurity: No Food Insecurity  ? Worried About Charity fundraiser in the Last Year: Never true  ? Ran Out of Food in the Last Year: Never true  ?Transportation Needs: No Transportation Needs  ? Lack of Transportation (Medical): No  ? Lack of Transportation (Non-Medical): No  ?Physical Activity: Sufficiently Active  ? Days of Exercise per Week: 6 days  ? Minutes of Exercise per Session: 60 min  ?Stress: No Stress Concern Present  ? Feeling of Stress : Not at all  ?Social Connections: Moderately Isolated  ? Frequency of Communication with Friends and Family: Twice a week  ? Frequency of Social Gatherings with Friends and Family: Three times a week  ? Attends Religious Services: Never  ? Active Member of Clubs or Organizations: No  ? Attends Archivist Meetings: Never  ? Marital Status: Married  ?Intimate Partner Violence: Not At Risk  ? Fear of Current or Ex-Partner: No  ? Emotionally Abused: No  ? Physically Abused: No  ? Sexually Abused: No  ? ? ?Family History  ?Problem Relation Age of Onset  ? Coronary artery disease Other   ? Colon cancer  Mother   ? Lung cancer Mother   ? Colon cancer Father   ? Early death Neg Hx   ? Hearing loss Neg Hx   ? Heart disease Neg Hx   ? Hyperlipidemia Neg Hx   ? Hypertension Neg Hx   ? Kidney disease Neg Hx   ? Stroke Neg Hx

## 2021-05-16 ENCOUNTER — Ambulatory Visit
Admission: RE | Admit: 2021-05-16 | Discharge: 2021-05-16 | Disposition: A | Discharge status: Home / Self Care | Payer: Medicare PPO | Source: Ambulatory Visit | Attending: Radiation Oncology | Admitting: Radiation Oncology

## 2021-05-16 VITALS — BP 157/88 | HR 71 | Temp 96.9°F | Resp 18 | Ht 66.0 in | Wt 153.4 lb

## 2021-05-16 DIAGNOSIS — C61 Malignant neoplasm of prostate: Secondary | ICD-10-CM | POA: Insufficient documentation

## 2021-05-16 DIAGNOSIS — Z800 Family history of malignant neoplasm of digestive organs: Secondary | ICD-10-CM | POA: Diagnosis not present

## 2021-05-16 DIAGNOSIS — I10 Essential (primary) hypertension: Secondary | ICD-10-CM | POA: Diagnosis not present

## 2021-05-16 DIAGNOSIS — E785 Hyperlipidemia, unspecified: Secondary | ICD-10-CM | POA: Diagnosis not present

## 2021-05-16 DIAGNOSIS — Z87891 Personal history of nicotine dependence: Secondary | ICD-10-CM | POA: Insufficient documentation

## 2021-05-16 DIAGNOSIS — Z7901 Long term (current) use of anticoagulants: Secondary | ICD-10-CM | POA: Insufficient documentation

## 2021-05-16 DIAGNOSIS — Z801 Family history of malignant neoplasm of trachea, bronchus and lung: Secondary | ICD-10-CM | POA: Insufficient documentation

## 2021-05-16 DIAGNOSIS — Z191 Hormone sensitive malignancy status: Secondary | ICD-10-CM | POA: Diagnosis not present

## 2021-05-16 DIAGNOSIS — Z79899 Other long term (current) drug therapy: Secondary | ICD-10-CM | POA: Insufficient documentation

## 2021-05-16 NOTE — Progress Notes (Signed)
?Radiation Oncology         [phone] ?________________________________ ? ?Initial Outpatient Consultation ? ?Name: Ronnie Black MRN: [id number]  ?Date: 05/16/2021  DOB: October 29, 1945 ? ?LK:GMWN, [name], MD  [name], MD  ? ?REFERRING PHYSICIAN: [name], MD ? ?DIAGNOSIS: 76 y.o. gentleman with Stage T1c adenocarcinoma of the prostate with Gleason score of 4+3, and PSA of 6.7. ? ?  ICD-10-CM   ?1. Malignant neoplasm of prostate ([place])  C61   ?  ? ? ?HISTORY OF PRESENT ILLNESS: Ronnie Black is a 76 y.o. male with a diagnosis of prostate cancer. He is an established urology patient with a history of elevated PSA since at least 2019 when his PSA was 4.5.  He has been followed by Dr. [name] with normal digital rectal exam and PSA has fluctuated between 4-5.  However, in September 2022, it further elevated to 7.9, digital rectal exam remained normal without nodularity at that time.  A repeat PSA in November 2022 remained elevated at 6.7.  Therefore, a prostate MRI was performed on 02/28/2021 for further evaluation and showed 3 separate PI-RADS 4 lesions with an 11 mm lesion in the left posterior medial apex peripheral zone, and will 8 mm lesion in the right posterior lateral base peripheral zone and a 6 mm lesion in the right posterior lateral base peripheral zone.  There was no evidence of extracapsular extension or lymphadenopathy.  The patient proceeded to MRI fusion transrectal ultrasound with 21 biopsies of the prostate on 04/04/2021.  The prostate volume measured 36.44 cc.  Out of 21 core biopsies, 10 were positive.  The maximum Gleason score was 4+3, and this was seen in a single core from the left mid gland.  Additionally, there was Gleason 3+4 in the right base lateral, left base, left mid lateral, left apex, left apex lateral, 1/3 cores from ROI #1, and 3/3 cores from ROI #2.  ? ?A CT A/P was performed on 04/24/2021 for disease staging and did not show any evidence of metastatic  disease. ? ?The patient reviewed the biopsy results with his urologist and he has kindly been referred today for discussion of potential radiation treatment options. ? ? ?PREVIOUS RADIATION THERAPY: No ? ?PAST MEDICAL HISTORY:  ?Past Medical History:  ?Diagnosis Date  ? Current use of long term anticoagulation 11/06/2018  ? DDD (degenerative disc disease)   ? HLD (hyperlipidemia)   ? Hypertension   ? 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 Persistent atrial fibrillation ([place])   ? s/p PVI 3/12  ? Prostate cancer ([place]) 05/08/2021  ? 04/2021  ?   ? ?PAST SURGICAL HISTORY: ?Past Surgical History:  ?Procedure Laterality Date  ? APPENDECTOMY    ? atrial fibrillation ablation  03/27/10  ? PVI with CTI ablation by JA  ? ATRIAL FIBRILLATION ABLATION N/A 11/21/2016  ? Procedure: ATRIAL FIBRILLATION ABLATION;  Surgeon: [name], MD;  Location: [place] [facility];  Service: Cardiovascular;  Laterality: N/A;  ? CARDIOVERSION    ? CARDIOVERSION  04/26/2011  ? Procedure: CARDIOVERSION;  Surgeon: Larey Dresser, MD;  Location: [place];  Service: Cardiovascular;  Laterality: N/A;  ? CARDIOVERSION N/A 04/28/2012  ? Procedure: CARDIOVERSION;  Surgeon: Thayer Headings, MD;  Location: [place];  Service: Cardiovascular;  Laterality: N/A;  ? CARDIOVERSION N/A 07/28/2012  ? Procedure: CARDIOVERSION;  Surgeon: Lelon Perla, MD;  Location: [place];  Service: Cardiovascular;  Laterality: N/A;  ? CARDIOVERSION N/A 08/14/2012  ?  Procedure: CARDIOVERSION;  Surgeon: Josue Hector, MD;  Location: [place];  Service: Cardiovascular;  Laterality: N/A;  ? CARDIOVERSION N/A 08/11/2014  ? Procedure: CARDIOVERSION;  Surgeon: Fay Records, MD;  Location: [place];  Service: Cardiovascular;  Laterality: N/A;  ? CARDIOVERSION N/A 08/26/2014  ? Procedure: CARDIOVERSION;  Surgeon: Sanda Klein, MD;  Location: [place] [facility];  Service: Cardiovascular;  Laterality: N/A;  ? CARDIOVERSION N/A 10/10/2016  ? Procedure:  CARDIOVERSION;  Surgeon: Larey Dresser, MD;  Location: [facility];  Service: Cardiovascular;  Laterality: N/A;  ? CARDIOVERSION N/A 12/04/2016  ? Procedure: CARDIOVERSION;  Surgeon: Larey Dresser, MD;  Location: [facility];  Service: Cardiovascular;  Laterality: N/A;  ? CARDIOVERSION N/A 06/09/2018  ? Procedure: CARDIOVERSION;  Surgeon: Dorothy Spark, MD;  Location: [place];  Service: Cardiovascular;  Laterality: N/A;  ? CARDIOVERSION N/A 08/17/2019  ? Procedure: CARDIOVERSION;  Surgeon: [name], MD;  Location: [place];  Service: Cardiovascular;  Laterality: N/A;  ? CARDIOVERSION N/A 11/18/2019  ? Procedure: CARDIOVERSION;  Surgeon: [name], MD;  Location: [facility] END[facility];  Service: Cardiovascular;  Laterality: N/A;  ? CARDIOVERSION N/A 03/09/2020  ? Procedure: CARDIOVERSION;  Surgeon: [name], MD;  Location: [facility];  Service: Cardiovascular;  Laterality: N/A;  ? SKIN CANCER EXCISION    ? TEE WITHOUT CARDIOVERSION  04/26/2011  ? Procedure: TRANSESOPHAGEAL ECHOCARDIOGRAM (TEE);  Surgeon: Larey Dresser, MD;  Location: [facility] ENDOSCOPY;  Service: Cardiovascular;  Laterality: N/A;  to be done at 1330  ? TEE WITHOUT CARDIOVERSION N/A 04/28/2012  ? Procedure: TRANSESOPHAGEAL ECHOCARDIOGRAM (TEE);  Surgeon: Thayer Headings, MD;  Location: [place];  Service: Cardiovascular;  Laterality: N/A;  ? WISDOM TOOTH EXTRACTION    ? ? ?FAMILY HISTORY:  ?Family History  ?Problem Relation Age of Onset  ? Coronary artery disease Other   ? Colon cancer Mother   ? Lung cancer Mother   ? Colon cancer Father   ? Early death Neg Hx   ? Hearing loss Neg Hx   ? Heart disease Neg Hx   ? Hyperlipidemia Neg Hx   ? Hypertension Neg Hx   ? Kidney disease Neg Hx   ? Stroke Neg Hx   ? ? ?SOCIAL HISTORY:  ?Social History  ? ?Socioeconomic History  ? Marital status: Widowed  ?  Spouse name: Not on file  ? Number of children: 2  ? Years of education: Not on file  ? Highest education level: Not on  file  ?Occupational History  ? Occupation: professor  ?  Employer: [employer]  ?  Comment: retired   ?Tobacco Use  ? Smoking status: Former  ?  Packs/day: 1.00  ?  Years: 15.00  ?  Pack years: 15.00  ?  Types: Cigarettes  ?  Quit date: 11/28/1975  ?  Years since quitting: 45.4  ? Smokeless tobacco: Never  ?Vaping Use  ? Vaping Use: Never used  ?Substance and Sexual Activity  ? Alcohol use: Yes  ?  Alcohol/week: 6.0 standard drinks  ?  Types: 3 Glasses of wine, 3 Cans of beer per week  ? Drug use: No  ? Sexual activity: Yes  ?Other Topics Concern  ? Not on file  ?Social History Narrative  ?  a professor of Therapist, occupational at [employer]  ? Lives in [place]   ? ?Social Determinants of Health  ? ?Financial Resource Strain: Low Risk   ? Difficulty of Paying Living Expenses: Not hard  at all  ?Food Insecurity: No Food Insecurity  ? Worried About Charity fundraiser in the Last Year: Never true  ? Ran Out of Food in the Last Year: Never true  ?Transportation Needs: No Transportation Needs  ? Lack of Transportation (Medical): No  ? Lack of Transportation (Non-Medical): No  ?Physical Activity: Sufficiently Active  ? Days of Exercise per Week: 6 days  ? Minutes of Exercise per Session: 60 min  ?Stress: No Stress Concern Present  ? Feeling of Stress : Not at all  ?Social Connections: Moderately Isolated  ? Frequency of Communication with Friends and Family: Twice a week  ? Frequency of Social Gatherings with Friends and Family: Three times a week  ? Attends Religious Services: Never  ? Active Member of Clubs or Organizations: No  ? Attends Archivist Meetings: Never  ? Marital Status: Married  ?Intimate Partner Violence: Not At Risk  ? Fear of Current or Ex-Partner: No  ? Emotionally Abused: No  ? Physically Abused: No  ? Sexually Abused: No  ? ? ?ALLERGIES: Patient has no known allergies. ? ?MEDICATIONS:  ?Current Outpatient Medications  ?Medication Sig Dispense Refill  ?  atorvastatin (LIPITOR) 10 MG tablet TAKE 1 TABLET BY MOUTH EVERY DAY 90 tablet 3  ? dofetilide (TIKOSYN) 500 MCG capsule TAKE 1 CAPSULE BY MOUTH 2 TIMES DAILY. 180 capsule 3  ? ELIQUIS 5 MG TABS tablet TAKE 1 TABLET BY

## 2021-05-17 NOTE — Progress Notes (Signed)
Called and introduced myself to the patient as the prostate nurse navigator.  No barriers to care identified at this time.  He saw Dr. Tammi Klippel on 4/26 to discuss his radiation treatment options, and has been scheduled for brachytherapy on 08/06/2021.  I gave him my contact information and asked him to call me with questions or concerns.  Verbalized understanding.  ?

## 2021-05-22 ENCOUNTER — Other Ambulatory Visit: Payer: Self-pay | Admitting: Urology

## 2021-05-22 ENCOUNTER — Telehealth: Payer: Self-pay | Admitting: Internal Medicine

## 2021-05-22 DIAGNOSIS — I700 Atherosclerosis of aorta: Secondary | ICD-10-CM | POA: Insufficient documentation

## 2021-05-22 NOTE — Telephone Encounter (Signed)
? ?  Pre-operative Risk Assessment  ?  ?Patient Name: Ronnie Black  ?DOB: 1945-06-10 ?MRN: [id number]  ? ?  ? ?Request for Surgical Clearance   ? ?Procedure:   radioactive seed inplant in prostate  ? ?Date of Surgery:  Clearance 08/06/21                              ?   ?Surgeon:  Dr. Preston Fleeting ?Surgeon's Group or Practice Name:  [facility]  ?Phone number:  [phone] ?Fax number:  [phone] ?  ?Type of Clearance Requested:   ?- Medical  Eliquis 48 hrs prior to surgery ?Aspirin 5 days prior  ?  ?Type of Anesthesia:   Choice ?  ?Additional requests/questions:     ? ?Signed, ?Milbert Coulter   ?05/22/2021, 9:39 AM  ? ?

## 2021-05-22 NOTE — Telephone Encounter (Signed)
Patient with diagnosis of afib on Eliquis for anticoagulation.   ? ?Procedure: radioactive seed inplant in prostate  ?Date of procedure: 08/06/21 ? ?CHA2DS2-VASc Score = 4  ?This indicates a 4.8% annual risk of stroke. ?The patient's score is based upon: ?CHF History: 0 ?HTN History: 1 ?Diabetes History: 0 ?Stroke History: 0 ?Vascular Disease History: 1 ?Age Score: 2 ?Gender Score: 0 ?  ?Aortic atherosclerosis noted on abdominal CT 04/24/21, PMH updated. ? ?CrCl 110m/min ?Platelet count 190K ? ?Per office protocol, patient can hold Eliquis for 2 days prior to procedure.   ?

## 2021-05-22 NOTE — Telephone Encounter (Signed)
? ?  Primary Cardiologist: Vickie Epley, MD ? ?Chart reviewed as part of pre-operative protocol coverage. Given past medical history and time since last visit, based on ACC/AHA guidelines, Ronnie Black would be at acceptable risk for the planned procedure without further cardiovascular testing.  ? ?Patient with diagnosis of afib on Eliquis for anticoagulation.   ?  ?Procedure: radioactive seed inplant in prostate  ?Date of procedure: 08/06/21 ?  ?CHA2DS2-VASc Score = 4  ?This indicates a 4.8% annual risk of stroke. ?The patient's score is based upon: ?CHF History: 0 ?HTN History: 1 ?Diabetes History: 0 ?Stroke History: 0 ?Vascular Disease History: 1 ?Age Score: 2 ?Gender Score: 0 ?  ?Aortic atherosclerosis noted on abdominal CT 04/24/21, PMH updated. ?  ?CrCl 89m/min ?Platelet count 190K ?  ?Per office protocol, patient can hold Eliquis for 2 days prior to procedure. ? ?I will route this recommendation to the requesting party via Epic fax function and remove from pre-op pool. ? ?Please call with questions. ? ?JJossie Ng Samentha Perham NP-C ? ?  ?05/22/2021, 2:27 PM ?CHays?[id number] ?Office [phone] ([phone]? ? ? ? ?

## 2021-06-04 ENCOUNTER — Telehealth: Payer: Self-pay | Admitting: Family Medicine

## 2021-06-04 NOTE — Telephone Encounter (Signed)
Copied from [place] [id number]. Topic: Medicare AWV ?>> Jun 04, 2021  1:49 PM Harris-Coley, Hannah Beat wrote: ?Reason for CRM: Left message for patient to schedule Annual Wellness Visit.  Please schedule with Nurse Health Advisor Charlott Rakes, RN at [facility].  Please call [phone] ask for Juliann Pulse ?

## 2021-06-11 ENCOUNTER — Telehealth: Payer: Self-pay

## 2021-06-11 NOTE — Telephone Encounter (Signed)
Patient is calling in requesting call back from Lyndee Hensen.  Stating that he had a question in regard to PT he had last fall.

## 2021-06-14 ENCOUNTER — Ambulatory Visit: Payer: Self-pay | Admitting: Urology

## 2021-06-20 ENCOUNTER — Telehealth: Payer: Self-pay | Admitting: *Deleted

## 2021-06-20 NOTE — Telephone Encounter (Signed)
CALLED PATIENT TO REMIND OF PRE-SEED APPTS. FOR 06-21-21, LVM FOR A RETURN CALL

## 2021-06-20 NOTE — Progress Notes (Signed)
Spoke with Ronnie Black  dr Tammi Klippel office, pt has current ekg in epic and meets [facility] guidelines for 08-06-2021 surgery at [place]

## 2021-06-21 ENCOUNTER — Other Ambulatory Visit: Payer: Self-pay

## 2021-06-21 ENCOUNTER — Ambulatory Visit
Admission: RE | Admit: 2021-06-21 | Discharge: 2021-06-21 | Disposition: A | Discharge status: Home / Self Care | Payer: Medicare PPO | Source: Ambulatory Visit | Attending: Radiation Oncology | Admitting: Radiation Oncology

## 2021-06-21 ENCOUNTER — Ambulatory Visit
Admission: RE | Admit: 2021-06-21 | Discharge: 2021-06-21 | Disposition: A | Discharge status: Home / Self Care | Payer: Medicare PPO | Source: Ambulatory Visit | Attending: Urology | Admitting: Urology

## 2021-06-21 ENCOUNTER — Encounter: Payer: Self-pay | Admitting: Urology

## 2021-06-21 VITALS — Resp 19 | Ht 66.0 in | Wt 147.0 lb

## 2021-06-21 DIAGNOSIS — C61 Malignant neoplasm of prostate: Secondary | ICD-10-CM | POA: Insufficient documentation

## 2021-06-21 DIAGNOSIS — Z191 Hormone sensitive malignancy status: Secondary | ICD-10-CM | POA: Diagnosis not present

## 2021-06-21 NOTE — Progress Notes (Signed)
Follow-up-new appointment. I verified patient's identity and began nursing interview. Patient is doing well. No issues reported at this time.  Meaningful use complete. No urinary management medications Urology- July 10th, 2023  Resp 19   Ht '5\' 6"'$  (1.676 m)   Wt 147 lb (66.7 kg)   BMI 23.73 kg/m

## 2021-06-21 NOTE — Progress Notes (Signed)
  Radiation Oncology         [phone] ________________________________  Name: Ronnie Black MRN: [id number]  Date: 06/21/2021  DOB: March 18, 1945  SIMULATION AND TREATMENT PLANNING NOTE PUBIC ARCH STUDY  BW:LSLH, [name], MD  [name], MD  DIAGNOSIS:  76 y.o. gentleman with Stage T1c adenocarcinoma of the prostate with Gleason Score of 4+3, and PSA of 6.7.  Oncology History  Malignant neoplasm of prostate ([place])  04/04/2021 Cancer Staging   Staging form: Prostate, AJCC 8th Edition - Clinical stage from 04/04/2021: Stage IIC (cT1c, cN0, cM0, PSA: 6.7, Grade Group: 3) - Signed by Freeman Caldron, PA-C on 05/16/2021 Histopathologic type: Adenocarcinoma, NOS Prostate specific antigen (PSA) range: Less than 10 Gleason primary pattern: 4 Gleason secondary pattern: 3 Gleason score: 7 Histologic grading system: 5 grade system Number of biopsy cores examined: 21 Number of biopsy cores positive: 10 Location of positive needle core biopsies: Both sides    05/08/2021 Initial Diagnosis   Malignant neoplasm of prostate ([place])        ICD-10-CM   1. Malignant neoplasm of prostate ([place])  C61       COMPLEX SIMULATION:  The patient presented today for evaluation for possible prostate seed implant. He was brought to the radiation planning suite and placed supine on the CT couch. A 3-dimensional image study set was obtained in upload to the planning computer. There, on each axial slice, I contoured the prostate gland. Then, using three-dimensional radiation planning tools I reconstructed the prostate in view of the structures from the transperineal needle pathway to assess for possible pubic arch interference. In doing so, I did not appreciate any pubic arch interference. Also, the patient's prostate volume was estimated based on the drawn structure. The volume was 36 cc.  Given the pubic arch appearance and prostate volume, patient remains a good candidate to proceed with prostate seed  implant. Today, he freely provided informed written consent to proceed.    PLAN: The patient will undergo prostate seed implant.   ________________________________  Sheral Apley. Tammi Klippel, M.D.

## 2021-07-02 ENCOUNTER — Ambulatory Visit: Payer: Medicare PPO | Admitting: Orthopaedic Surgery

## 2021-07-02 DIAGNOSIS — M1611 Unilateral primary osteoarthritis, right hip: Secondary | ICD-10-CM | POA: Insufficient documentation

## 2021-07-02 DIAGNOSIS — M1612 Unilateral primary osteoarthritis, left hip: Secondary | ICD-10-CM | POA: Diagnosis not present

## 2021-07-02 DIAGNOSIS — M160 Bilateral primary osteoarthritis of hip: Secondary | ICD-10-CM

## 2021-07-02 NOTE — Progress Notes (Signed)
The patient is a very pleasant and active 76 year old gentleman who comes in his referral for evaluation treatment of bilateral hip pain with the right worse than left.  He says he thinks at some point he will need hip replacement surgery but right now the pain is more of an annoying achy type of swelling.  He said it did flareup last month when he was traveling abroad.  He is not ready for replacement yet but he wants to establish a relationship with an orthopedic surgeon.  He has been dealing with some prostate issues as well.  He is active and thin and not a diabetic.  He is on Eliquis for atrial fibrillation but can come off of this for surgery if needed.  His pain is more mobility related.  It did calm down after his trip.  He does have another trip planned for the fall.  He has not had intra-articular steroid injections and this is certainly an option to try prior to a trip if he gets to where it is hurting him enough.  He does not need to walk with assistive ice.  He cannot take anti-inflammatories due to being on Eliquis.  Both hips are stiff with internal and external rotation with the right a little bit worse than the left.  His leg lengths are equal.  AP pelvis and lateral both hips was reviewed with him.  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  The right shows superior lateral joint space loss.  Both hips have particular osteophytes and sclerotic changes with flattening of the superior lateral femoral head.  We talked in length in detail about hip replacement surgery.  I showed him a hip replacement model and gave him a handout about hip replacement surgery.  All questions and concerns were answered and addressed.  If he gets to where it is bothering him enough he will let us know because we can always set him up for an intra-articular steroid injection.  Eventually hip replacement is something that he will pursue when his pain is getting bad enough.

## 2021-07-30 DIAGNOSIS — C61 Malignant neoplasm of prostate: Secondary | ICD-10-CM | POA: Diagnosis not present

## 2021-07-31 ENCOUNTER — Encounter (HOSPITAL_BASED_OUTPATIENT_CLINIC_OR_DEPARTMENT_OTHER): Payer: Self-pay | Admitting: Urology

## 2021-07-31 ENCOUNTER — Other Ambulatory Visit: Payer: Self-pay

## 2021-07-31 NOTE — Progress Notes (Addendum)
Spoke w/ via phone for pre-op interview---pt Lab needs dos---- I stat              Lab results------ COVID test -----patient states asymptomatic no test needed Arrive at -------1100 am NPO after MN NO Solid Food.  Clear liquids from MN until---1000 am  Med rec completed Medications to take morning of surgery -----Tikosyn, metoprolol tartrate Diabetic medication -----n/a Patient instructed no nail polish to be worn day of surgery Patient instructed to bring photo id and insurance card day of surgery Patient aware to have Driver (ride ) / caregiver daughters nicole and michelle    for 24 hours after surgery  Patient Special Instructions -----fleets enema am of surgery Pre-Op special Istructions ----- Patient verbalized understanding of instructions that were given at this phone interview. Patient denies  cardiac s & s, shortness of breath, chest pain, fever, cough at this phone interview.   Cardiac clearance note jesse cleaver np 05-22-2021 chart/epic (note to stop eliquis 2 days before surgery, pt aware last dose to be 08-03-2021)  ekg 05-15-2021 chart/epic Lov dr Quentin Ore cardiology 04-03-2021 epic Lov donna carroll np afib clinic 05-15-2021 epic

## 2021-08-03 ENCOUNTER — Telehealth: Payer: Self-pay | Admitting: *Deleted

## 2021-08-03 NOTE — Telephone Encounter (Signed)
CALLED PATIENT TO REMIND OF PROCEDURE FOR 08-06-21, SPOKE WITH PATIENT AND HE IS AWARE OF THIS PROCEDURE

## 2021-08-06 ENCOUNTER — Other Ambulatory Visit: Payer: Self-pay

## 2021-08-06 ENCOUNTER — Ambulatory Visit (HOSPITAL_BASED_OUTPATIENT_CLINIC_OR_DEPARTMENT_OTHER)
Admission: RE | Admit: 2021-08-06 | Discharge: 2021-08-06 | Disposition: A | Discharge status: Home / Self Care | Payer: Medicare PPO | Attending: Urology | Admitting: Urology

## 2021-08-06 ENCOUNTER — Encounter (HOSPITAL_BASED_OUTPATIENT_CLINIC_OR_DEPARTMENT_OTHER): Payer: Self-pay | Admitting: Urology

## 2021-08-06 ENCOUNTER — Ambulatory Visit (HOSPITAL_BASED_OUTPATIENT_CLINIC_OR_DEPARTMENT_OTHER): Payer: Medicare PPO | Admitting: Anesthesiology

## 2021-08-06 ENCOUNTER — Encounter (HOSPITAL_BASED_OUTPATIENT_CLINIC_OR_DEPARTMENT_OTHER)
Admission: RE | Disposition: A | Discharge status: Home / Self Care | Payer: Self-pay | Source: Home / Self Care | Attending: Urology

## 2021-08-06 ENCOUNTER — Ambulatory Visit (HOSPITAL_COMMUNITY): Payer: Medicare PPO

## 2021-08-06 DIAGNOSIS — I10 Essential (primary) hypertension: Secondary | ICD-10-CM

## 2021-08-06 DIAGNOSIS — Z87891 Personal history of nicotine dependence: Secondary | ICD-10-CM | POA: Diagnosis not present

## 2021-08-06 DIAGNOSIS — Z191 Hormone sensitive malignancy status: Secondary | ICD-10-CM | POA: Diagnosis not present

## 2021-08-06 DIAGNOSIS — Z79899 Other long term (current) drug therapy: Secondary | ICD-10-CM | POA: Diagnosis not present

## 2021-08-06 DIAGNOSIS — I4891 Unspecified atrial fibrillation: Secondary | ICD-10-CM | POA: Diagnosis not present

## 2021-08-06 DIAGNOSIS — Z01818 Encounter for other preprocedural examination: Secondary | ICD-10-CM

## 2021-08-06 DIAGNOSIS — M1990 Unspecified osteoarthritis, unspecified site: Secondary | ICD-10-CM | POA: Diagnosis not present

## 2021-08-06 DIAGNOSIS — C61 Malignant neoplasm of prostate: Secondary | ICD-10-CM | POA: Diagnosis not present

## 2021-08-06 HISTORY — DX: Other complications of anesthesia, initial encounter: T88.59XA

## 2021-08-06 HISTORY — PX: RADIOACTIVE SEED IMPLANT: SHX5150

## 2021-08-06 HISTORY — PX: SPACE OAR INSTILLATION: SHX6769

## 2021-08-06 LAB — POCT I-STAT, CHEM 8
BUN: 14 mg/dL (ref 8–23)
Calcium, Ion: 1.08 mmol/L — ABNORMAL LOW (ref 1.15–1.40)
Chloride: 99 mmol/L (ref 98–111)
Creatinine, Ser: 0.7 mg/dL (ref 0.61–1.24)
Glucose, Bld: 85 mg/dL (ref 70–99)
HCT: 51 % (ref 39.0–52.0)
Hemoglobin: 17.3 g/dL — ABNORMAL HIGH (ref 13.0–17.0)
Potassium: 4.4 mmol/L (ref 3.5–5.1)
Sodium: 138 mmol/L (ref 135–145)
TCO2: 26 mmol/L (ref 22–32)

## 2021-08-06 SURGERY — INSERTION, RADIATION SOURCE, PROSTATE
Anesthesia: General | Site: Prostate

## 2021-08-06 MED ORDER — SODIUM CHLORIDE (PF) 0.9 % IJ SOLN
INTRAMUSCULAR | Status: DC | PRN
  Administered 2021-08-06: 10 mL

## 2021-08-06 MED ORDER — FENTANYL CITRATE (PF) 100 MCG/2ML IJ SOLN
INTRAMUSCULAR | Status: AC
  Filled 2021-08-06: qty 2

## 2021-08-06 MED ORDER — CEFAZOLIN SODIUM-DEXTROSE 2-4 GM/100ML-% IV SOLN
INTRAVENOUS | Status: AC
  Filled 2021-08-06: qty 100

## 2021-08-06 MED ORDER — FENTANYL CITRATE (PF) 100 MCG/2ML IJ SOLN
INTRAMUSCULAR | Status: DC | PRN
Start: 2021-08-06 — End: 2021-08-06
  Administered 2021-08-06: 50 ug via INTRAVENOUS

## 2021-08-06 MED ORDER — ROCURONIUM BROMIDE 10 MG/ML (PF) SYRINGE
PREFILLED_SYRINGE | INTRAVENOUS | Status: AC
  Filled 2021-08-06: qty 10

## 2021-08-06 MED ORDER — ACETAMINOPHEN 500 MG PO TABS
1000.0000 mg | ORAL_TABLET | Freq: Once | ORAL | Status: AC
  Administered 2021-08-06: 1000 mg via ORAL

## 2021-08-06 MED ORDER — STERILE WATER FOR IRRIGATION IR SOLN
Status: DC | PRN
  Administered 2021-08-06: 500 mL

## 2021-08-06 MED ORDER — ONDANSETRON HCL 4 MG/2ML IJ SOLN
INTRAMUSCULAR | Status: AC
  Filled 2021-08-06: qty 2

## 2021-08-06 MED ORDER — CEFAZOLIN SODIUM-DEXTROSE 2-3 GM-%(50ML) IV SOLR
INTRAVENOUS | Status: DC | PRN
  Administered 2021-08-06: 2 g via INTRAVENOUS

## 2021-08-06 MED ORDER — SUGAMMADEX SODIUM 200 MG/2ML IV SOLN
INTRAVENOUS | Status: DC | PRN
  Administered 2021-08-06: 140 mg via INTRAVENOUS

## 2021-08-06 MED ORDER — ACETAMINOPHEN 500 MG PO TABS
ORAL_TABLET | ORAL | Status: AC
  Filled 2021-08-06: qty 2

## 2021-08-06 MED ORDER — LIDOCAINE 2% (20 MG/ML) 5 ML SYRINGE
INTRAMUSCULAR | Status: DC | PRN
  Administered 2021-08-06: 80 mg via INTRAVENOUS

## 2021-08-06 MED ORDER — ONDANSETRON HCL 4 MG/2ML IJ SOLN
INTRAMUSCULAR | Status: DC | PRN
  Administered 2021-08-06: 4 mg via INTRAVENOUS

## 2021-08-06 MED ORDER — ROCURONIUM BROMIDE 10 MG/ML (PF) SYRINGE
PREFILLED_SYRINGE | INTRAVENOUS | Status: DC | PRN
  Administered 2021-08-06: 50 mg via INTRAVENOUS

## 2021-08-06 MED ORDER — FENTANYL CITRATE (PF) 100 MCG/2ML IJ SOLN
25.0000 ug | INTRAMUSCULAR | Status: DC | PRN
  Administered 2021-08-06 (×2): 25 ug via INTRAVENOUS

## 2021-08-06 MED ORDER — DEXAMETHASONE SODIUM PHOSPHATE 10 MG/ML IJ SOLN
INTRAMUSCULAR | Status: DC | PRN
  Administered 2021-08-06: 5 mg via INTRAVENOUS

## 2021-08-06 MED ORDER — FLEET ENEMA 7-19 GM/118ML RE ENEM: 1.0000 | ENEMA | Freq: Once | RECTAL | Status: DC

## 2021-08-06 MED ORDER — LACTATED RINGERS IV SOLN
INTRAVENOUS | Status: DC
  Administered 2021-08-06: 1000 mL via INTRAVENOUS

## 2021-08-06 MED ORDER — IOHEXOL 300 MG/ML  SOLN
INTRAMUSCULAR | Status: DC | PRN
  Administered 2021-08-06: 7 mL

## 2021-08-06 MED ORDER — PROPOFOL 10 MG/ML IV BOLUS
INTRAVENOUS | Status: AC
  Filled 2021-08-06: qty 20

## 2021-08-06 MED ORDER — SODIUM CHLORIDE 0.9 % IV SOLN
INTRAVENOUS | Status: AC | PRN
  Administered 2021-08-06: 100 mL

## 2021-08-06 MED ORDER — DEXAMETHASONE SODIUM PHOSPHATE 10 MG/ML IJ SOLN
INTRAMUSCULAR | Status: AC
  Filled 2021-08-06: qty 1

## 2021-08-06 MED ORDER — PROPOFOL 10 MG/ML IV BOLUS
INTRAVENOUS | Status: DC | PRN
  Administered 2021-08-06: 140 mg via INTRAVENOUS

## 2021-08-06 MED ORDER — ONDANSETRON HCL 4 MG/2ML IJ SOLN: 4.0000 mg | Freq: Once | INTRAMUSCULAR | Status: DC | PRN

## 2021-08-06 SURGICAL SUPPLY — 40 items
BAG DRN RND TRDRP ANRFLXCHMBR (UROLOGICAL SUPPLIES) ×1
BAG URINE DRAIN 2000ML AR STRL (UROLOGICAL SUPPLIES) ×3 IMPLANT
BLADE CLIPPER SENSICLIP SURGIC (BLADE) ×3 IMPLANT
Bard Quicklink Cartridge with Brachy Source I-125 ×71 IMPLANT
CATH FOLEY 2WAY SLVR  5CC 16FR (CATHETERS) ×2
CATH FOLEY 2WAY SLVR 5CC 16FR (CATHETERS) ×2 IMPLANT
CATH ROBINSON RED A/P 20FR (CATHETERS) ×3 IMPLANT
CLOTH BEACON ORANGE TIMEOUT ST (SAFETY) ×3 IMPLANT
CNTNR URN SCR LID CUP LEK RST (MISCELLANEOUS) ×2 IMPLANT
CONT SPEC 4OZ STRL OR WHT (MISCELLANEOUS) ×2
COVER BACK TABLE 60X90IN (DRAPES) ×3 IMPLANT
COVER MAYO STAND STRL (DRAPES) ×3 IMPLANT
DRSG TEGADERM 4X4.75 (GAUZE/BANDAGES/DRESSINGS) ×3 IMPLANT
DRSG TEGADERM 8X12 (GAUZE/BANDAGES/DRESSINGS) ×3 IMPLANT
GAUZE SPONGE 4X4 12PLY STRL (GAUZE/BANDAGES/DRESSINGS) ×1 IMPLANT
GEL ULTRASOUND 20GR AQUASONIC (MISCELLANEOUS) ×3 IMPLANT
GLOVE BIO SURGEON STRL SZ 6.5 (GLOVE) ×3 IMPLANT
GLOVE BIO SURGEON STRL SZ8 (GLOVE) ×6 IMPLANT
GLOVE BIOGEL PI IND STRL 7.0 (GLOVE) IMPLANT
GLOVE BIOGEL PI INDICATOR 7.0 (GLOVE) ×3
GLOVE SURG ORTHO 8.5 STRL (GLOVE) ×6 IMPLANT
GOWN STRL REUS W/TWL XL LVL3 (GOWN DISPOSABLE) ×5 IMPLANT
GRID BRACH TEMP 18GA 2.8X3X.75 (MISCELLANEOUS) ×3 IMPLANT
HOLDER FOLEY CATH W/STRAP (MISCELLANEOUS) ×3 IMPLANT
IMPL SPACEOAR VUE SYSTEM (Spacer) ×2 IMPLANT
IMPLANT SPACEOAR VUE SYSTEM (Spacer) ×2 IMPLANT
IV NS 1000ML (IV SOLUTION) ×2
IV NS 1000ML BAXH (IV SOLUTION) ×2 IMPLANT
KIT TURNOVER CYSTO (KITS) ×3 IMPLANT
NDL BRACHY 18G 5PK (NEEDLE) ×8 IMPLANT
NDL PK MORGANSTERN STABILIZ (NEEDLE) ×2 IMPLANT
NEEDLE BRACHY 18G 5PK (NEEDLE) ×8 IMPLANT
NEEDLE PK MORGANSTERN STABILIZ (NEEDLE) ×2 IMPLANT
PACK CYSTO (CUSTOM PROCEDURE TRAY) ×3 IMPLANT
SHEATH ULTRASOUND LF (SHEATH) ×1 IMPLANT
SYR 10ML LL (SYRINGE) ×4 IMPLANT
SYR CONTROL 10ML LL (SYRINGE) ×3 IMPLANT
TOWEL OR 17X26 10 PK STRL BLUE (TOWEL DISPOSABLE) ×3 IMPLANT
UNDERPAD 30X36 HEAVY ABSORB (UNDERPADS AND DIAPERS) ×6 IMPLANT
WATER STERILE IRR 500ML POUR (IV SOLUTION) ×3 IMPLANT

## 2021-08-06 NOTE — Discharge Instructions (Addendum)
Radioactive Seed Implant Home Care Instructions   Activity:    Rest for the remainder of the day.  Do not drive or operate equipment today.  You may resume normal  activities in a few days as instructed by your physician, without risk of harmful radiation exposure to those around you, provided you follow the time and distance precautions on the Radiation Oncology Instruction Sheet.   Meals: Drink plenty of lipuids and eat light foods, such as gelatin or soup this evening .  You may return to normal meal plan tomorrow.  Special Instruction:   If any seeds are found, use tweezers to pick up seeds and place in a glass container of any kind and bring to your physician's office.  Call your physician if any of these symptoms occur:  Persistent or heavy bleeding Urine stream diminishes or stops completely after catheter is removed Fever equal to or greater than 101 degrees F Cloudy urine with a strong foul odor Severe pain  You may feel some burning pain and/or hesitancy when you urinate after the catheter is removed.  These symptoms may increase over the next few weeks, but should diminish within forur to six weeks.  Applying moist heat to the lower abdomen or a hot tub bath may help relieve the pain.  If the discomfort becomes severe, please call your physician for additional medications.  It is OK to resume Eliquis on Tuesday     Post Anesthesia Home Care Instructions  Activity: Get plenty of rest for the remainder of the day. A responsible individual must stay with you for 24 hours following the procedure.  For the next 24 hours, DO NOT: -Drive a car -Paediatric nurse -Drink alcoholic beverages -Take any medication unless instructed by your physician -Make any legal decisions or sign important papers.  Meals: Start with liquid foods such as gelatin or soup. Progress to regular foods as tolerated. Avoid greasy, spicy, heavy foods. If nausea and/or vomiting occur, drink only clear  liquids until the nausea and/or vomiting subsides. Call your physician if vomiting continues.  Special Instructions/Symptoms: Your throat may feel dry or sore from the anesthesia or the breathing tube placed in your throat during surgery. If this causes discomfort, gargle with warm salt water. The discomfort should disappear within 24 hours.  May take Tylenol beginning at 6 PM as needed for soreness/discomfort.

## 2021-08-06 NOTE — Transfer of Care (Signed)
Immediate Anesthesia Transfer of Care Note  Patient: Ronnie Black  Procedure(s) Performed: RADIOACTIVE SEED IMPLANT/BRACHYTHERAPY IMPLANT (Prostate) SPACE OAR INSTILLATION (Prostate)  Patient Location: PACU  Anesthesia Type:General  Level of Consciousness: awake, alert  and oriented  Airway & Oxygen Therapy: Patient Spontanous Breathing and Patient connected to nasal cannula oxygen  Post-op Assessment: Report given to RN  Post vital signs: Reviewed and stable  Last Vitals:  Vitals Value Taken Time  BP 135/86   Temp    Pulse 72 08/06/21 1420  Resp 14   SpO2 98 % 08/06/21 1420  Vitals shown include unvalidated device data.  Last Pain:  Vitals:   08/06/21 1138  TempSrc:   PainSc: 0-No pain      Patients Stated Pain Goal: 6 (00/17/49 4496)  Complications: No notable events documented.

## 2021-08-06 NOTE — Anesthesia Preprocedure Evaluation (Signed)
Anesthesia Evaluation  Patient identified by MRN, date of birth, ID band Patient awake    Reviewed: Allergy & Precautions, NPO status , Patient's Chart, lab work & pertinent test results  History of Anesthesia Complications (+) PROLONGED EMERGENCE and history of anesthetic complications  Airway Mallampati: II  TM Distance: >3 FB Neck ROM: Full    Dental  (+) Teeth Intact, Dental Advisory Given   Pulmonary former smoker,    Pulmonary exam normal breath sounds clear to auscultation       Cardiovascular hypertension, Pt. on home beta blockers and Pt. on medications Normal cardiovascular exam+ dysrhythmias Atrial Fibrillation  Rhythm:Regular Rate:Normal  HOCM  Echo 11/23/19: 1. The apical views are concerning for apical LV hypertrophy. . Left  ventricular ejection fraction, by estimation, is 60 to 65%. The left  ventricle has normal function. The left ventricle has no regional wall  motion abnormalities. 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 Left ventricular diastolic parameters are  indeterminate.  2. Right ventricular systolic function is normal. The right ventricular  size is normal.  3. The mitral valve is normal in structure. Mild mitral valve  regurgitation. No evidence of mitral stenosis.  4. The aortic valve is normal in structure. Aortic valve regurgitation is  not visualized. No aortic stenosis is present.    Neuro/Psych negative neurological ROS     GI/Hepatic negative GI ROS, Neg liver ROS,   Endo/Other  negative endocrine ROS  Renal/GU negative Renal ROS   Prostate cancer     Musculoskeletal  (+) Arthritis ,   Abdominal   Peds  Hematology  (+) Blood dyscrasia (Eliquis), ,   Anesthesia Other Findings Day of surgery medications reviewed with the patient.  Reproductive/Obstetrics                             Anesthesia Physical Anesthesia Plan  ASA:  3  Anesthesia Plan: General   Post-op Pain Management: Tylenol PO (pre-op)*   Induction: Intravenous  PONV Risk Score and Plan: 3 and Dexamethasone and Ondansetron  Airway Management Planned: LMA  Additional Equipment:   Intra-op Plan:   Post-operative Plan: Extubation in OR  Informed Consent: I have reviewed the patients History and Physical, chart, labs and discussed the procedure including the risks, benefits and alternatives for the proposed anesthesia with the patient or authorized representative who has indicated his/her understanding and acceptance.     Dental advisory given  Plan Discussed with: CRNA  Anesthesia Plan Comments:         Anesthesia Quick Evaluation

## 2021-08-06 NOTE — Interval H&P Note (Signed)
History and Physical Interval Note:  08/06/2021 12:30 PM  Ronnie Black  has presented today for surgery, with the diagnosis of PROSTATE CANCER.  The various methods of treatment have been discussed with the patient and family. After consideration of risks, benefits and other options for treatment, the patient has consented to  Procedure(s): RADIOACTIVE SEED IMPLANT/BRACHYTHERAPY IMPLANT (N/A) SPACE OAR INSTILLATION (N/A) as a surgical intervention.  The patient's history has been reviewed, patient examined, no change in status, stable for surgery.  I have reviewed the patient's chart and labs.  Questions were answered to the patient's satisfaction.     Lillette Boxer Damacio Weisgerber

## 2021-08-06 NOTE — Progress Notes (Signed)
  Radiation Oncology         [phone] ________________________________  Name: Ronnie Black MRN: [id number]  Date: 08/06/2021  DOB: 1945/02/11       Prostate Seed Implant  CC:Ronnie Arnt, MD  No ref. provider found   DIAGNOSIS:   76 y.o. gentleman with Stage T1c adenocarcinoma of the prostate with Gleason Score of 4+3, and PSA of 6.7.  Oncology History  Malignant neoplasm of prostate ([place])  04/04/2021 Cancer Staging   Staging form: Prostate, AJCC 8th Edition - Clinical stage from 04/04/2021: Stage IIC (cT1c, cN0, cM0, PSA: 6.7, Grade Group: 3) - Signed by Ronnie Caldron, PA-C on 05/16/2021 Histopathologic type: Adenocarcinoma, NOS Prostate specific antigen (PSA) range: Less than 10 Gleason primary pattern: 4 Gleason secondary pattern: 3 Gleason score: 7 Histologic grading system: 5 grade system Number of biopsy cores examined: 21 Number of biopsy cores positive: 10 Location of positive needle core biopsies: Both sides   05/08/2021 Initial Diagnosis   Malignant neoplasm of prostate ([place])       ICD-10-CM   1. Pre-op testing  Z01.818 CBG per Guidelines for Diabetes Management for Patients Undergoing Surgery ([facility], [facility], and [facility] only)    CBG per protocol    I-Stat, Chem 8 on day of surgery per protocol      PROCEDURE: Insertion of radioactive I-125 seeds into the prostate gland.  RADIATION DOSE: 145 Gy, definitive therapy.  TECHNIQUE: Ronnie Black was brought to the operating room with the urologist. He was placed in the dorsolithotomy position. He was catheterized and a rectal tube was inserted. The perineum was shaved, prepped and draped. The ultrasound probe was then introduced into the rectum to see the prostate gland.  TREATMENT DEVICE: A needle grid was attached to the ultrasound probe stand and anchor needles were placed.  3D PLANNING: The prostate was imaged in 3D using a sagittal sweep of the prostate probe. These images were transferred to the planning  computer. There, the prostate, urethra and rectum were defined on each axial reconstructed image. Then, the software created an optimized 3D plan and a few seed positions were adjusted. The quality of the plan was reviewed using [facility] information for the target and the following two organs at risk:  Urethra and Rectum.  Then the accepted plan was printed and handed off to the radiation therapist.  Under my supervision, the custom loading of the seeds and spacers was carried out and loaded into sealed vicryl sleeves.  These pre-loaded needles were then placed into the needle holder.Marland Kitchen  PROSTATE VOLUME STUDY:  Using transrectal ultrasound the volume of the prostate was verified to be 41.9 cc.  SPECIAL TREATMENT PROCEDURE/SUPERVISION AND HANDLING: The pre-loaded needles were then delivered under sagittal guidance. A total of 18 needles were used to deposit 71 seeds in the prostate gland. The individual seed activity was 0.413 mCi.  SpaceOAR:  Yes  COMPLEX SIMULATION: At the end of the procedure, an anterior radiograph of the pelvis was obtained to document seed positioning and count. Cystoscopy was performed to check the urethra and bladder.  MICRODOSIMETRY: At the end of the procedure, the patient was emitting 0.1 mR/hr at 1 meter. Accordingly, he was considered safe for hospital discharge.  PLAN: The patient will return to the radiation oncology clinic for post implant CT dosimetry in three weeks.   ________________________________  Ronnie Black Ronnie Black, M.D.

## 2021-08-06 NOTE — Anesthesia Postprocedure Evaluation (Signed)
Anesthesia Post Note  Patient: Ronnie Black  Procedure(s) Performed: RADIOACTIVE SEED IMPLANT/BRACHYTHERAPY IMPLANT (Prostate) SPACE OAR INSTILLATION (Prostate)     Patient location during evaluation: PACU Anesthesia Type: General Level of consciousness: awake and alert Pain management: pain level controlled Vital Signs Assessment: post-procedure vital signs reviewed and stable Respiratory status: spontaneous breathing, nonlabored ventilation, respiratory function stable and patient connected to nasal cannula oxygen Cardiovascular status: blood pressure returned to baseline and stable Postop Assessment: no apparent nausea or vomiting Anesthetic complications: no   No notable events documented.  Last Vitals:  Vitals:   08/06/21 1430 08/06/21 1445  BP: (!) 131/94 (!) 149/80  Pulse: 71 66  Resp: 16 (!) 25  Temp:    SpO2: 97% 99%    Last Pain:  Vitals:   08/06/21 1445  TempSrc:   PainSc: Ronnie Black

## 2021-08-06 NOTE — H&P (Signed)
H&P  Chief Complaint: PCA  History of Present Illness: 76 year old male presents for I-125 brachytherapy/SpaceOAR for management of grade group 2 adenocarcinoma.  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   Persistent atrial fibrillation ([place])    s/p PVI 3/12   Prostate cancer ([place]) 05/08/2021   04/2021    Past Surgical History:  Procedure Laterality Date   APPENDECTOMY  1970   atrial fibrillation ablation  03/27/2010   PVI with CTI ablation by [place] N/A 11/21/2016   Procedure: [place];  Surgeon: [name], MD;  Location: [facility];  Service: Cardiovascular;  Laterality: N/A;   CARDIOVERSION     CARDIOVERSION  04/26/2011   Procedure: CARDIOVERSION;  Surgeon: Larey Dresser, MD;  Location: [place];  Service: Cardiovascular;  Laterality: N/A;   CARDIOVERSION N/A 04/28/2012   Procedure: CARDIOVERSION;  Surgeon: Thayer Headings, MD;  Location: [place];  Service: Cardiovascular;  Laterality: N/A;   CARDIOVERSION N/A 07/28/2012   Procedure: CARDIOVERSION;  Surgeon: Lelon Perla, MD;  Location: [place];  Service: Cardiovascular;  Laterality: N/A;   CARDIOVERSION N/A 08/14/2012   Procedure: CARDIOVERSION;  Surgeon: Josue Hector, MD;  Location: [place];  Service: Cardiovascular;  Laterality: N/A;   CARDIOVERSION N/A 08/11/2014   Procedure: CARDIOVERSION;  Surgeon: Fay Records, MD;  Location: [place];  Service: Cardiovascular;  Laterality: N/A;   CARDIOVERSION N/A 08/26/2014   Procedure: CARDIOVERSION;  Surgeon: Sanda Klein, MD;  Location: [facility] ENDOSCOPY;  Service: Cardiovascular;  Laterality: N/A;   CARDIOVERSION N/A 10/10/2016   Procedure:  CARDIOVERSION;  Surgeon: Larey Dresser, MD;  Location: [facility] ENDOSCOPY;  Service: Cardiovascular;  Laterality: N/A;   CARDIOVERSION N/A 12/04/2016   Procedure: CARDIOVERSION;  Surgeon: Larey Dresser, MD;  Location: [facility] ENDOSCOPY;  Service: Cardiovascular;  Laterality: N/A;   CARDIOVERSION N/A 06/09/2018   Procedure: CARDIOVERSION;  Surgeon: Dorothy Spark, MD;  Location: [facility] ENDOSCOPY;  Service: Cardiovascular;  Laterality: N/A;   CARDIOVERSION N/A 08/17/2019   Procedure: CARDIOVERSION;  Surgeon: [name], MD;  Location: [place];  Service: Cardiovascular;  Laterality: N/A;   CARDIOVERSION N/A 11/18/2019   Procedure: CARDIOVERSION;  Surgeon: [name], MD;  Location: [place];  Service: Cardiovascular;  Laterality: N/A;   CARDIOVERSION N/A 03/09/2020   Procedure: CARDIOVERSION;  Surgeon: [name], MD;  Location: [place];  Service: Cardiovascular;  Laterality: N/A;   SKIN CANCER EXCISION     TEE WITHOUT CARDIOVERSION  04/26/2011   Procedure: TRANSESOPHAGEAL ECHOCARDIOGRAM (TEE);  Surgeon: Larey Dresser, MD;  Location: [facility] ENDOSCOPY;  Service: Cardiovascular;  Laterality: N/A;  to be done at 1330   TEE WITHOUT CARDIOVERSION N/A 04/28/2012   Procedure: TRANSESOPHAGEAL ECHOCARDIOGRAM (TEE);  Surgeon: Thayer Headings, MD;  Location: [facility] ENDOSCOPY;  Service: Cardiovascular;  Laterality: N/A;   WISDOM TOOTH EXTRACTION     yrs ago    Home Medications:    Allergies: No Known Allergies  Family History  Problem Relation Age of Onset   Coronary artery disease Other    Colon cancer Mother    Lung cancer Mother    Colon cancer Father    Early death Neg Hx  Hearing loss Neg Hx    Heart disease Neg Hx    Hyperlipidemia Neg Hx    Hypertension Neg Hx    Kidney disease Neg Hx    Stroke Neg Hx     Social History:  reports that he quit smoking about 45 years ago. His smoking use included cigarettes. He has a 7.50 pack-year smoking history. He has never  used smokeless tobacco. He reports current alcohol use of about 6.0 standard drinks of alcohol per week. He reports that he does not use drugs.  ROS: A complete review of systems was performed.  All systems are negative except for pertinent findings as noted.  Physical Exam:  Vital signs in last 24 hours: BP (!) 153/91   Pulse 67   Temp 98 F (36.7 C) (Oral)   Resp 15   Ht '5\' 6"'$  (1.676 m)   Wt 67.7 kg   SpO2 99%   BMI 24.10 kg/m  Constitutional:  Alert and oriented, No acute distress Cardiovascular: Regular rate  Respiratory: Normal respiratory effort Neurologic: Grossly intact, no focal deficits Psychiatric: Normal mood and affect  I have reviewed prior pt notes  I have reviewed notes from referring/previous physicians  I have reviewed urinalysis results  I have independently reviewed prior imaging  I have reviewed prior pathology/PSA results     Impression/Assessment:  Grade group 2 adenocarcinoma  Plan:  I-125 brachytherapy, SpaceOAR

## 2021-08-06 NOTE — Anesthesia Procedure Notes (Signed)
Procedure Name: Intubation Date/Time: 08/06/2021 1:12 PM  Performed by: Bonney Aid, CRNAPre-anesthesia Checklist: Patient identified, Emergency Drugs available, Suction available and Patient being monitored Patient Re-evaluated:Patient Re-evaluated prior to induction Oxygen Delivery Method: Circle system utilized Preoxygenation: Pre-oxygenation with 100% oxygen Induction Type: IV induction Ventilation: Mask ventilation without difficulty Laryngoscope Size: Mac and 4 Grade View: Grade II Tube type: Oral Tube size: 7.5 mm Number of attempts: 1 Airway Equipment and Method: Stylet Placement Confirmation: ETT inserted through vocal cords under direct vision, positive ETCO2 and breath sounds checked- equal and bilateral Secured at: 22 cm Tube secured with: Tape Dental Injury: Teeth and Oropharynx as per pre-operative assessment

## 2021-08-06 NOTE — Op Note (Signed)
Preoperative diagnosis: Clinical stage TI C adenocarcinoma the prostate   Postoperative diagnosis: Same   Procedure: I-125 prostate seed implantation, SpaceOAR placement, flexible cystoscopy  Surgeon: Lillette Boxer. Esperansa Sarabia M.D.  Radiation Oncologist: Tyler Pita, M.D.  Anesthesia: Gen.   Indications: Patient  was diagnosed with clinical stage TI C prostate cancer. We had extensive discussion with him about treatment options versus. He elected to proceed with seed implantation. He underwent consultation my office as well as with Dr. Tammi Klippel. He appeared to understand the advantages disadvantages potential risks of this treatment option. Full informed consent has been obtained.   Technique and findings: Patient was brought the operating room where he had successful induction of general anesthesia. He was placed in dorso-lithotomy position and prepped and draped in usual manner. Appropriate surgical timeout was performed. Radiation oncology department placed a transrectal ultrasound probe anchoring stand. Foley catheter with contrast in the balloon was inserted without difficulty. Anchoring needles were placed within the prostate. Rectal tube was placed. Real-time contouring of the urethra prostate and rectum were performed and the dosing parameters were established. Targeted dose was 145 gray.  I was then called  to the operating suite suite for placement of the needles. A second timeout was performed. All needle passage was done with real-time transrectal ultrasound guidance with the sagittal plane. A total of 18 needles were placed.  71 active seeds were implanted.  I then proceeded with placement of SpaceOAR by introducing a needle with the bevel angled inferiorly approximately 2 cm superior to the anus. This was angled downward and under direct ultrasound was placed within the space between the prostatic capsule and rectum. This was confirmed with a small amount of sterile saline injected and  this was performed under direct ultrasound. I then attached the SpaceOAR to the needle and injected this in the space between the prostate and rectum with good placement noted. The Foley catheter was removed and flexible cystoscopy failed to show any seeds outside the prostate.  The patient was brought to recovery room in stable condition, having tolerated the procedure well.Marland Kitchen

## 2021-08-07 ENCOUNTER — Encounter (HOSPITAL_BASED_OUTPATIENT_CLINIC_OR_DEPARTMENT_OTHER): Payer: Self-pay | Admitting: Urology

## 2021-08-28 ENCOUNTER — Telehealth: Payer: Self-pay | Admitting: *Deleted

## 2021-08-28 NOTE — Telephone Encounter (Signed)
CALLED PATIENT TO REMIND OF POST SEED APPTS. FOR 08-30-21, SPOKE WITH PATIENT AND HE IS AWARE OF THESE APPTS.

## 2021-08-29 NOTE — Progress Notes (Signed)
  Radiation Oncology         [phone] ________________________________  Name: Ronnie Black MRN: [id number]  Date: 08/30/2021  DOB: Sep 21, 1945  COMPLEX SIMULATION NOTE  NARRATIVE:  The patient was brought to the [place] suite today following prostate seed implantation approximately one month ago.  Identity was confirmed.  All relevant records and images related to the planned course of therapy were reviewed.  Then, the patient was set-up supine.  CT images were obtained.  The CT images were loaded into the planning software.  Then the prostate and rectum were contoured.  Treatment planning then occurred.  The implanted iodine 125 seeds were identified by the physics staff for projection of radiation distribution  I have requested : 3D Simulation  I have requested a DVH of the following structures: Prostate and rectum.    ________________________________  Sheral Apley Tammi Klippel, M.D.

## 2021-08-30 ENCOUNTER — Ambulatory Visit: Payer: Medicare PPO | Admitting: Radiation Oncology

## 2021-08-30 ENCOUNTER — Ambulatory Visit
Admission: RE | Admit: 2021-08-30 | Discharge: 2021-08-30 | Disposition: A | Discharge status: Home / Self Care | Payer: Medicare PPO | Source: Ambulatory Visit | Attending: Radiation Oncology | Admitting: Radiation Oncology

## 2021-08-30 ENCOUNTER — Other Ambulatory Visit: Payer: Self-pay

## 2021-08-30 ENCOUNTER — Encounter: Payer: Self-pay | Admitting: Urology

## 2021-08-30 VITALS — BP 128/83 | HR 70 | Temp 97.9°F | Resp 18 | Ht 66.0 in | Wt 150.0 lb

## 2021-08-30 DIAGNOSIS — C61 Malignant neoplasm of prostate: Secondary | ICD-10-CM | POA: Insufficient documentation

## 2021-08-30 NOTE — Progress Notes (Signed)
Radiation Oncology         [phone] ________________________________  Name: Ronnie Black MRN: [id number]  Date: 08/30/2021  DOB: 02-28-1945  Post-Seed Follow-Up Visit Note  CC: Ronnie Arnt, MD  Ronnie Gallo, MD  Diagnosis:   76 y.o. gentleman with Stage T1c adenocarcinoma of the prostate with Gleason Score of 4+3, and PSA of 6.7.    ICD-10-CM   1. Malignant neoplasm of prostate ([place])  C61       Interval Since Last Radiation:  3.5 weeks 08/06/21:  Insertion of radioactive I-125 seeds into the prostate gland; 145 Gy, definitive therapy with placement of SpaceOAR gel.  Narrative:  The patient returns today for routine follow-up.  He is complaining of increased urinary frequency and urinary hesitation symptoms. He filled out a questionnaire regarding urinary function today providing and overall IPSS score of 14 characterizing his symptoms as moderate with nocturia x3, hesitancy, intermittency, weak stream and frequency.  He has continued taking Flomax daily as prescribed. His pre-implant score was 6. He denies any abdominal pain or bowel symptoms aside from more frequent, small volume, loose stools. He has a healthy appetite and is maintaining his weight. His energy level has not been affected and overall, he is pleased with his progress to date.  ALLERGIES:  has No Known Allergies.  Meds: Current Outpatient Medications  Medication Sig Dispense Refill   tamsulosin (FLOMAX) 0.4 MG CAPS capsule Take 0.4 mg by mouth daily.     atorvastatin (LIPITOR) 10 MG tablet TAKE 1 TABLET BY MOUTH EVERY DAY (Patient taking differently: at bedtime.) 90 tablet 3   dofetilide (TIKOSYN) 500 MCG capsule TAKE 1 CAPSULE BY MOUTH 2 TIMES DAILY. 180 capsule 3   ELIQUIS 5 MG TABS tablet TAKE 1 TABLET BY MOUTH TWICE A DAY 180 tablet 1   fluticasone (CUTIVATE) 0.005 % ointment Apply 1 application. topically 2 (two) times daily. (Patient taking differently: Apply 1 application  topically as needed.)  30 g 0   ibuprofen (ADVIL) 200 MG tablet Take 400 mg by mouth daily as needed for mild pain.     melatonin 5 MG TABS Take 5 mg by mouth at bedtime.     metoprolol tartrate (LOPRESSOR) 25 MG tablet TAKE 1 TABLET BY MOUTH TWICE A DAY 180 tablet 2   Multiple Vitamin (MULTIVITAMIN WITH MINERALS) TABS tablet Take 1 tablet by mouth 3 (three) times a week.     sodium chloride (OCEAN) 0.65 % SOLN nasal spray Place 1 spray into both nostrils as needed (dryness).     No current facility-administered medications for this encounter.    Physical Findings: In general this is a well appearing Caucasian male in no acute distress. He's alert and oriented x4 and appropriate throughout the examination. Cardiopulmonary assessment is negative for acute distress and he exhibits normal effort.   Lab Findings: Lab Results  Component Value Date   WBC 5.2 12/01/2020   HGB 17.3 (H) 08/06/2021   HCT 51.0 08/06/2021   MCV 86.8 12/01/2020   PLT 190.0 12/01/2020    Radiographic Findings:  Patient underwent CT imaging in our clinic for post implant dosimetry. The CT will be reviewed by Dr. Tammi Klippel to confirm there is an adequate distribution of radioactive seeds throughout the prostate gland and ensure that there are no seeds in or near the rectum.  We suspect the final radiation plan and dosimetry will show appropriate coverage of the prostate gland. He understands that we will call and inform him of any  unexpected findings on further review of his imaging and dosimetry.  Impression/Plan: 76 y.o. gentleman with Stage T1c adenocarcinoma of the prostate with Gleason Score of 4+3, and PSA of 6.7. The patient is recovering from the effects of radiation. His urinary symptoms should gradually improve over the next 4-6 months. We talked about this today. He is encouraged by his improvement already and is otherwise pleased with his outcome. We also talked about long-term follow-up for prostate cancer following seed implant. He  understands that ongoing PSA determinations and digital rectal exams will help perform surveillance to rule out disease recurrence. He has a follow up appointment scheduled with [name], NP on 08/31/21 and then will see Dr. [name] in 11/2021. He understands what to expect with his PSA measures. Patient was also educated today about some of the long-term effects from radiation including a small risk for rectal bleeding and possibly erectile dysfunction. We talked about some of the general management approaches to these potential complications. However, I did encourage the patient to contact our office or return at any point if he has questions or concerns related to his previous radiation and prostate cancer.    Ronnie Johns, PA-C

## 2021-08-30 NOTE — Progress Notes (Signed)
Telephone appointment. I verified patient's identity and began nursing interview. Patient reports some urinary discomfort. No other prostate related issues reported at this time.  Meaningful use complete. I-PSS score of 14- moderate Flomax as directed. Urology appt- August, 2023.   BP 128/83 (BP Location: Left Arm, Patient Position: Sitting, Cuff Size: Normal)   Pulse 70   Temp 97.9 F (36.6 C) (Temporal)   Resp 18   Ht '5\' 6"'$  (1.676 m)   Wt 150 lb (68 kg)   SpO2 100%   BMI 24.21 kg/m

## 2021-08-31 DIAGNOSIS — C61 Malignant neoplasm of prostate: Secondary | ICD-10-CM | POA: Diagnosis not present

## 2021-09-18 ENCOUNTER — Encounter (HOSPITAL_COMMUNITY): Payer: Self-pay | Admitting: Nurse Practitioner

## 2021-09-18 ENCOUNTER — Ambulatory Visit (HOSPITAL_COMMUNITY)
Admission: RE | Admit: 2021-09-18 | Discharge: 2021-09-18 | Disposition: A | Discharge status: Home / Self Care | Payer: Medicare PPO | Source: Ambulatory Visit | Attending: Nurse Practitioner | Admitting: Nurse Practitioner

## 2021-09-18 VITALS — BP 148/72 | HR 81 | Ht 66.0 in | Wt 152.2 lb

## 2021-09-18 DIAGNOSIS — I10 Essential (primary) hypertension: Secondary | ICD-10-CM | POA: Diagnosis not present

## 2021-09-18 DIAGNOSIS — D6869 Other thrombophilia: Secondary | ICD-10-CM

## 2021-09-18 DIAGNOSIS — Z7901 Long term (current) use of anticoagulants: Secondary | ICD-10-CM | POA: Diagnosis not present

## 2021-09-18 DIAGNOSIS — Z79899 Other long term (current) drug therapy: Secondary | ICD-10-CM | POA: Insufficient documentation

## 2021-09-18 DIAGNOSIS — I4819 Other persistent atrial fibrillation: Secondary | ICD-10-CM | POA: Diagnosis not present

## 2021-09-18 DIAGNOSIS — I4892 Unspecified atrial flutter: Secondary | ICD-10-CM | POA: Insufficient documentation

## 2021-09-18 LAB — BASIC METABOLIC PANEL
Anion gap: 7 (ref 5–15)
BUN: 14 mg/dL (ref 8–23)
CO2: 29 mmol/L (ref 22–32)
Calcium: 9.4 mg/dL (ref 8.9–10.3)
Chloride: 103 mmol/L (ref 98–111)
Creatinine, Ser: 0.85 mg/dL (ref 0.61–1.24)
GFR, Estimated: >60 mL/min (ref >60)
Glucose, Bld: 90 mg/dL (ref 70–99)
Potassium: 4.1 mmol/L (ref 3.5–5.1)
Sodium: 139 mmol/L (ref 135–145)

## 2021-09-18 LAB — MAGNESIUM: Magnesium: 2.2 mg/dL (ref 1.7–2.4)

## 2021-09-18 MED ORDER — LOSARTAN POTASSIUM 25 MG PO TABS: 25.0000 mg | ORAL_TABLET | Freq: Every day | ORAL | 6 refills | Status: DC

## 2021-09-18 NOTE — Progress Notes (Signed)
Patient ID: GOLDEN GILREATH, male   DOB: 1945/08/04, 76 y.o.   MRN: [id number]     Primary Care Physician: [name], MD Referring Physician: Dr. Ron Parker EP: Dr. Carmelia Bake is a 76 y.o. male with a h/o afib, frequent cardioversions in  the past. He has had afib/flutter ablation in 2012  and afib ablation fall of 2018. He was on norpace at one time, changed to amiodarone which was stopped shortly after last ablation, then back to Norpace. Unfortunately, he continued to have symptomatic breakthrough episodes of afib and the decision was made to stop Norpace and load on dofetilide.   On follow up today, patient presents for dofetilide admission. He states he stopped Norpace 5 days ago and has not missed any doses of anticoagulation in the last 3 weeks. He is in SR today.    F/u in afib clinic, 06/01/20. He is one week s/p tikosyn load. He did well maintaining SR in the hospital and  was d/c on 500 mcg dofetilide  bid with stable qtc. He feels well and has been staying  in rhythm.   F/u in afib clinic, 02/13/21. He is staying in rhythm. Unfortunately, he lost his wife suddenly in July 2022. He is exercising regulary. He is compliant with his drugs.   F/u in afib clinic, 05/14/22. He is here for Tikosyn surveillance. He has established with Dr. Quentin Ore as of March of this year with  Dr. [name] leaving the practice.  He is doing well staying in [place]. Compliant with Tikosyn.   F/u in afib clinic, 8/29 for Tikosyn surveillance. He has not noted any afib. He does note elevated BP's at home 140-150 range which have slowly creeped up and is concerned he may need BP med. His ekg showed some bigeminy PVC's but on ausculation he sounded regular. Qt stable.    Today, he denies symptoms of  palpitations, chest pain, shortness of breath, orthopnea, PND, lower extremity edema, dizziness, presyncope, syncope, or neurologic sequela.  .The patient is tolerating medications without difficulties and is  otherwise without complaint today.   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   Persistent atrial fibrillation ([place])    s/p PVI 3/12   Prostate cancer ([place]) 05/08/2021   04/2021   Past Surgical History:  Procedure Laterality Date   APPENDECTOMY  1970   atrial fibrillation ablation  03/27/2010   PVI with CTI ablation by [place] N/A 11/21/2016   Procedure: [place];  Surgeon: [name], MD;  Location: [place] CV LAB;  Service: Cardiovascular;  Laterality: N/A;   CARDIOVERSION     CARDIOVERSION  04/26/2011   Procedure: CARDIOVERSION;  Surgeon: Larey Dresser, MD;  Location: [facility] ENDOSCOPY;  Service: Cardiovascular;  Laterality: N/A;   CARDIOVERSION N/A 04/28/2012   Procedure: CARDIOVERSION;  Surgeon: Thayer Headings, MD;  Location: [place];  Service: Cardiovascular;  Laterality: N/A;   CARDIOVERSION N/A 07/28/2012   Procedure: CARDIOVERSION;  Surgeon: Lelon Perla, MD;  Location: [facility] ENDOSCOPY;  Service: Cardiovascular;  Laterality: N/A;   CARDIOVERSION N/A 08/14/2012   Procedure: CARDIOVERSION;  Surgeon: Josue Hector, MD;  Location: [place];  Service: Cardiovascular;  Laterality: N/A;   CARDIOVERSION N/A 08/11/2014   Procedure: CARDIOVERSION;  Surgeon: Fay Records, MD;  Location: [place];  Service: Cardiovascular;  Laterality: N/A;   CARDIOVERSION N/A 08/26/2014   Procedure: CARDIOVERSION;  Surgeon: Sanda Klein, MD;  Location: [place] ENDOSCOPY;  Service: Cardiovascular;  Laterality: N/A;   CARDIOVERSION N/A 10/10/2016   Procedure: CARDIOVERSION;  Surgeon: Larey Dresser, MD;  Location: [facility] ENDOSCOPY;  Service: Cardiovascular;  Laterality: N/A;   CARDIOVERSION N/A  12/04/2016   Procedure: CARDIOVERSION;  Surgeon: Larey Dresser, MD;  Location: [facility] ENDOSCOPY;  Service: Cardiovascular;  Laterality: N/A;   CARDIOVERSION N/A 06/09/2018   Procedure: CARDIOVERSION;  Surgeon: Dorothy Spark, MD;  Location: [facility] ENDOSCOPY;  Service: Cardiovascular;  Laterality: N/A;   CARDIOVERSION N/A 08/17/2019   Procedure: CARDIOVERSION;  Surgeon: [name], MD;  Location: [place];  Service: Cardiovascular;  Laterality: N/A;   CARDIOVERSION N/A 11/18/2019   Procedure: CARDIOVERSION;  Surgeon: [name], MD;  Location: [place];  Service: Cardiovascular;  Laterality: N/A;   CARDIOVERSION N/A 03/09/2020   Procedure: CARDIOVERSION;  Surgeon: [name], MD;  Location: [place];  Service: Cardiovascular;  Laterality: N/A;   RADIOACTIVE SEED IMPLANT N/A 08/06/2021   Procedure: RADIOACTIVE SEED IMPLANT/BRACHYTHERAPY IMPLANT;  Surgeon: [name], MD;  Location[facility];  Service: Urology;  Laterality: N/A;   SKIN CANCER EXCISION     SPACE OAR INSTILLATION N/A 08/06/2021   Procedure: SPACE OAR INSTILLATION;  Surgeon: [name], MD;  Location[facility];  Service: Urology;  Laterality: N/A;   TEE WITHOUT CARDIOVERSION  04/26/2011   Procedure: TRANSESOPHAGEAL ECHOCARDIOGRAM (TEE);  Surgeon: Larey Dresser, MD;  Location: [facility] ENDOSCOPY;  Service: Cardiovascular;  Laterality: N/A;  to be done at 1330   TEE WITHOUT CARDIOVERSION N/A 04/28/2012   Procedure: TRANSESOPHAGEAL ECHOCARDIOGRAM (TEE);  Surgeon: Thayer Headings, MD;  Location: [facility] ENDOSCOPY;  Service: Cardiovascular;  Laterality: N/A;   WISDOM TOOTH EXTRACTION     yrs ago    Current Outpatient Medications  Medication Sig Dispense Refill   atorvastatin (LIPITOR) 10 MG tablet TAKE 1 TABLET BY MOUTH EVERY DAY (Patient taking differently: at bedtime.) 90 tablet 3   dofetilide (TIKOSYN) 500 MCG capsule TAKE 1 CAPSULE BY MOUTH 2 TIMES DAILY. 180  capsule 3   ELIQUIS 5 MG TABS tablet TAKE 1 TABLET BY MOUTH TWICE A DAY 180 tablet 1   fluticasone (CUTIVATE) 0.005 % ointment Apply 1 application. topically 2 (two) times daily. (Patient taking differently: Apply 1 application  topically as needed.) 30 g 0   ibuprofen (ADVIL) 200 MG tablet Take 400 mg by mouth daily as needed for mild pain.     losartan (COZAAR) 25 MG tablet Take 1 tablet (25 mg total) by mouth daily. 30 tablet 6   melatonin 5 MG TABS Take 5 mg by mouth at bedtime.     metoprolol tartrate (LOPRESSOR) 25 MG tablet TAKE 1 TABLET BY MOUTH TWICE A DAY 180 tablet 2   Multiple Vitamin (MULTIVITAMIN WITH MINERALS) TABS tablet Take 1 tablet by mouth 3 (three) times a week.     sodium chloride (OCEAN) 0.65 % SOLN nasal spray Place 1 spray into both nostrils as needed (dryness).     tamsulosin (FLOMAX) 0.4 MG CAPS capsule Take 0.4 mg by mouth daily.     No current facility-administered medications for this encounter.    No Known Allergies  Social History   Socioeconomic History   Marital status: Widowed  Spouse name: Not on file   Number of children: 2   Years of education: Not on file   Highest education level: Not on file  Occupational History   Occupation: professor    Employer: [employer]     Comment: retired   Tobacco Use   Smoking status: Former    Packs/day: 0.50    Years: 15.00    Total pack years: 7.50    Types: Cigarettes    Quit date: 11/28/1975    Years since quitting: 45.8   Smokeless tobacco: Never  Vaping Use   Vaping Use: Never used  Substance and Sexual Activity   Alcohol use: Yes    Alcohol/week: 6.0 standard drinks of alcohol    Types: 3 Glasses of wine, 3 Cans of beer per week    Comment: social   Drug use: No   Sexual activity: Yes  Other Topics Concern   Not on file  Social History Narrative    a professor of Therapist, occupational at [employer] in [place] @ [employer] Strain: [place]  (06/09/2020)   Overall Financial Resource Strain (CARDIA)    Difficulty of Paying Living Expenses: Not hard at all  Food Insecurity: No Food Insecurity (06/09/2020)   Hunger Vital Sign    Worried About Running Out of Food in the Last Year: Never true    [place] in the Last Year: Never true  Transportation Needs: No Transportation Needs (06/09/2020)   PRAPARE - Hydrologist (Medical): No    Lack of Transportation (Non-Medical): No  Physical Activity: Sufficiently Active (06/09/2020)   Exercise Vital Sign    Days of Exercise per Week: 6 days    Minutes of Exercise per Session: 60 min  Stress: No Stress Concern Present (06/09/2020)   [place]    Feeling of Stress : Not at all  Social Connections: Moderately Isolated (06/09/2020)   Social Connection and Isolation Panel [NHANES]    Frequency of Communication with Friends and Family: Twice a week    Frequency of Social Gatherings with Friends and Family: Three times a week    Attends Religious Services: Never    Active Member of Clubs or Organizations: No    Attends Archivist Meetings: Never    Marital Status: Married  Human resources officer Violence: Not At Risk (06/09/2020)   Humiliation, Afraid, Rape, and Kick questionnaire    Fear of Current or Ex-Partner: No    Emotionally Abused: No    Physically Abused: No    Sexually Abused: No    Family History  Problem Relation Age of Onset   Coronary artery disease Other    Colon cancer Mother    Lung cancer Mother    Colon cancer Father    Early death Neg Hx    Hearing loss Neg Hx    Heart disease Neg Hx    Hyperlipidemia Neg Hx    Hypertension Neg Hx    Kidney disease Neg Hx    Stroke Neg Hx     ROS- All systems are reviewed and negative except as per the HPI above  Physical Exam: Vitals:   09/18/21 1457  BP: (!) 148/72  Pulse: 81   Weight: 69 kg  Height: '5\' 6"'$  (1.676 m)    GEN- The patient is a well appearing elderly male, alert  and oriented x 3 today.   HEENT-head normocephalic, atraumatic, sclera clear, conjunctiva pink, hearing intact, trachea midline. Lungs- Clear to ausculation bilaterally, normal work of breathing Heart- Regular rate and rhythm, no murmurs, rubs or gallops  GI- soft, NT, ND, + BS Extremities- no clubbing, cyanosis, or edema MS- no significant deformity or atrophy Skin- no rash or lesion Psych- euthymic mood, full affect Neuro- strength and sensation are intact  Ekg-Vent. rate 81 BPM PR interval 196 ms QRS duration 94 ms QT/QTcB 376/436 ms P-R-T axes 78 14 -4 Sinus rhythm with frequent Premature ventricular complexes T wave abnormality, consider inferolateral ischemia Abnormal ECG When compared with ECG of 15-May-2021 14:36, PREVIOUS ECG IS PRESENT  Echo 11/23/19 1. The apical views are concerning for apical LV hypertrophy. . Left  ventricular ejection fraction, by estimation, is 60 to 65%. The left  ventricle has normal function. The left ventricle has no regional wall  motion abnormalities. There is severe apical   left ventricular hypertrophy. Left ventricular diastolic parameters are  indeterminate.   2. Right ventricular systolic function is normal. The right ventricular  size is normal.   3. The mitral valve is normal in structure. Mild mitral valve  regurgitation. No evidence of mitral stenosis.   4. The aortic valve is normal in structure. Aortic valve regurgitation is  not visualized. No aortic stenosis is present.    Assessment and Plan:  1. Persistent atrial fibrillation/atrial flutter  S/p ablation 03/2010 and 11/21/16 Patient  had  dofetilide loading hospitalization May 2022 Maintaining  SR, qt stable  Bigeminy PVC's seen on ekg but on auscultation rhythm was regular  Continue tikosyn 500 mcg bid  Continue Eliquis 5 mg BID for chadsvasc score of 3  Continue  metoprolol tartrate  25 mg bid    Bmet/mag today   2. HCM Nonobstructive   3. HTN Elevation of BP's over the last several  months to 932-355 systolic range We discussed starting low dose BP med Will start losartan 25 mg daily   Come back to office in 10 days for BP check with bmet/ekg   Butch Penny C. Inez Stantz, [facility] [address] [place], [place] [address] [phone]     F/u with Dr. Quentin Ore as scheduled    F/u afib clinic for Tikosyn  surveillance in 4 months   Geroge Baseman. Tonisha Silvey, [facility] [address] [place], [place] [address] [phone]

## 2021-09-18 NOTE — Patient Instructions (Signed)
Start losartan '25mg'$  once a day

## 2021-09-19 ENCOUNTER — Other Ambulatory Visit (HOSPITAL_BASED_OUTPATIENT_CLINIC_OR_DEPARTMENT_OTHER): Payer: Self-pay

## 2021-09-27 ENCOUNTER — Ambulatory Visit (HOSPITAL_COMMUNITY)
Admission: RE | Admit: 2021-09-27 | Discharge: 2021-09-27 | Disposition: A | Discharge status: Home / Self Care | Payer: Medicare PPO | Source: Ambulatory Visit | Attending: Physician Assistant | Admitting: Physician Assistant

## 2021-09-27 ENCOUNTER — Other Ambulatory Visit (HOSPITAL_BASED_OUTPATIENT_CLINIC_OR_DEPARTMENT_OTHER): Payer: Self-pay

## 2021-09-27 VITALS — BP 124/56 | HR 69

## 2021-09-27 DIAGNOSIS — I4819 Other persistent atrial fibrillation: Secondary | ICD-10-CM

## 2021-09-27 LAB — BASIC METABOLIC PANEL
Anion gap: 6 (ref 5–15)
BUN: 13 mg/dL (ref 8–23)
CO2: 31 mmol/L (ref 22–32)
Calcium: 9.5 mg/dL (ref 8.9–10.3)
Chloride: 100 mmol/L (ref 98–111)
Creatinine, Ser: 0.85 mg/dL (ref 0.61–1.24)
GFR, Estimated: >60 mL/min (ref >60)
Glucose, Bld: 101 mg/dL — ABNORMAL HIGH (ref 70–99)
Potassium: 4.1 mmol/L (ref 3.5–5.1)
Sodium: 137 mmol/L (ref 135–145)

## 2021-09-27 MED ORDER — AREXVY 120 MCG/0.5ML IM SUSR
INTRAMUSCULAR | 0 refills | Status: DC
Start: 2021-09-27 — End: 2021-12-06
  Filled 2021-09-27: qty 0.5, 1d supply, fill #0

## 2021-09-27 NOTE — Progress Notes (Signed)
Pt in for repat ekg after last visit showed EKG's with freqeunt PVC's. Today EKG improved with less ectopy. Qt stable.  PR interval 192 ms QRS duration 96 ms QT/QTcB 414/443 ms P-R-T axes 69 30 -6 Sinus rhythm with Premature atrial complexes RSR' or QR pattern in V1 suggests right ventricular conduction delay T wave abnormality, consider inferolateral ischemia Abnormal ECG When compared with ECG of 18-Sep-2021 15:07, PREVIOUS ECG IS PRESENT  Pt was also started on losartan 25 mg daily on last visit with improvement in BP's, today at 124/56. Bmet drawn. F/u visit in 6 months

## 2021-10-03 ENCOUNTER — Encounter: Payer: Self-pay | Admitting: Orthopaedic Surgery

## 2021-10-04 ENCOUNTER — Other Ambulatory Visit (HOSPITAL_BASED_OUTPATIENT_CLINIC_OR_DEPARTMENT_OTHER): Payer: Self-pay

## 2021-10-04 MED ORDER — INFLUENZA VAC A&B SA ADJ QUAD 0.5 ML IM PRSY
PREFILLED_SYRINGE | INTRAMUSCULAR | 0 refills | Status: DC
  Filled 2021-10-04: qty 0.5, 1d supply, fill #0

## 2021-10-10 ENCOUNTER — Encounter: Payer: Self-pay | Admitting: Radiation Oncology

## 2021-10-10 ENCOUNTER — Ambulatory Visit
Admission: RE | Admit: 2021-10-10 | Discharge: 2021-10-10 | Disposition: A | Discharge status: Home / Self Care | Payer: Medicare PPO | Source: Ambulatory Visit | Attending: Radiation Oncology | Admitting: Radiation Oncology

## 2021-10-10 DIAGNOSIS — Z191 Hormone sensitive malignancy status: Secondary | ICD-10-CM | POA: Diagnosis not present

## 2021-10-10 DIAGNOSIS — C61 Malignant neoplasm of prostate: Secondary | ICD-10-CM | POA: Diagnosis not present

## 2021-10-10 NOTE — Progress Notes (Signed)
  Radiation Oncology         ([phone]) [phone] ________________________________  Name: JONAH GINGRAS MRN: [id number]  Date: 10/10/2021  DOB: 23-Jan-1945  3D Planning Note   Prostate Brachytherapy Post-Implant Dosimetry  Diagnosis: 76 y.o. gentleman with Stage T1c adenocarcinoma of the prostate with Gleason Score of 4+3, and PSA of 6.7.  Narrative: On a previous date, BRIDGER PIZZI returned following prostate seed implantation for post implant planning. He underwent CT scan complex simulation to delineate the three-dimensional structures of the pelvis and demonstrate the radiation distribution.  Since that time, the seed localization, and complex isodose planning with dose volume histograms have now been completed.  Results:   Prostate Coverage - The dose of radiation delivered to the 90% or more of the prostate gland (D90) was 116.65% of the prescription dose. This exceeds our goal of greater than 90%. Rectal Sparing - The volume of rectal tissue receiving the prescription dose or higher was 0.0 cc. This falls under our thresholds tolerance of 1.0 cc.  Impression: The prostate seed implant appears to show adequate target coverage and appropriate rectal sparing.  Plan:  The patient will continue to follow with urology for ongoing PSA determinations. I would anticipate a high likelihood for local tumor control with minimal risk for rectal morbidity.  ________________________________  Sheral Apley Tammi Klippel, M.D.

## 2021-10-15 ENCOUNTER — Encounter: Payer: Self-pay | Admitting: *Deleted

## 2021-10-23 ENCOUNTER — Encounter: Payer: Self-pay | Admitting: Sports Medicine

## 2021-10-23 ENCOUNTER — Ambulatory Visit: Payer: Self-pay

## 2021-10-23 ENCOUNTER — Ambulatory Visit: Payer: Medicare PPO | Admitting: Sports Medicine

## 2021-10-23 VITALS — BP 143/73 | HR 71

## 2021-10-23 DIAGNOSIS — M25551 Pain in right hip: Secondary | ICD-10-CM | POA: Diagnosis not present

## 2021-10-23 DIAGNOSIS — M1611 Unilateral primary osteoarthritis, right hip: Secondary | ICD-10-CM

## 2021-10-23 MED ORDER — LIDOCAINE HCL 1 % IJ SOLN
4.0000 mL | INTRAMUSCULAR | Status: AC | PRN
  Administered 2021-10-23: 4 mL

## 2021-10-23 MED ORDER — METHYLPREDNISOLONE ACETATE 40 MG/ML IJ SUSP
80.0000 mg | INTRAMUSCULAR | Status: AC | PRN
  Administered 2021-10-23: 80 mg via INTRA_ARTICULAR

## 2021-10-23 NOTE — Progress Notes (Signed)
   Procedure Note  Patient: Ronnie Black             Date of Birth: Nov 15, 1945           MRN: [id number]             Visit Date: 10/23/2021  Procedures: Visit Diagnoses:  1. Pain in right hip   2. Osteoarthritis of right hip, unspecified osteoarthritis type    Large Joint Inj: R hip joint on 10/23/2021 1:08 PM Indications: pain Details: 22 G 3.5 in needle, ultrasound-guided anterior approach Medications: 4 mL lidocaine 1 %; 80 mg methylPREDNISolone acetate 40 MG/ML  Procedure: US-guided intra-articular hip injection, left After discussion on risks/benefits/indications and informed verbal consent was obtained, a timeout was performed. Patient was lying supine on exam table. The hip was cleaned with betadine and alcohol swabs. Then utilizing ultrasound guidance, the patient's femoral head and neck junction was identified and subsequently injected with 4:2 lidocaine:depomedrol via an in-plane approach with ultrasound visualization of the injectate administered into the hip joint. Patient tolerated procedure well without immediate complications.  Procedure, treatment alternatives, risks and benefits explained, specific risks discussed. Consent was given by the patient. Immediately prior to procedure a time out was called to verify the correct patient, procedure, equipment, support staff and site/side marked as required. Patient was prepped and draped in the usual sterile fashion.    - I evaluated the patient about 10 minutes post-injection and they had improvement in pain and range of motion - follow-up with Dr. Ninfa Linden as indicated; I am happy to see him back as needed  Elba Barman, DO [place]  This note was dictated using Dragon naturally speaking software and may contain errors in syntax, spelling, or content which have not been identified prior to signing this note.

## 2021-10-31 ENCOUNTER — Other Ambulatory Visit (HOSPITAL_BASED_OUTPATIENT_CLINIC_OR_DEPARTMENT_OTHER): Payer: Self-pay

## 2021-10-31 DIAGNOSIS — C61 Malignant neoplasm of prostate: Secondary | ICD-10-CM | POA: Diagnosis not present

## 2021-10-31 DIAGNOSIS — N400 Enlarged prostate without lower urinary tract symptoms: Secondary | ICD-10-CM | POA: Diagnosis not present

## 2021-11-09 ENCOUNTER — Telehealth: Payer: Self-pay | Admitting: Family Medicine

## 2021-11-09 NOTE — Telephone Encounter (Signed)
Copied from [place] [phone]. Topic: Medicare AWV >> Nov 09, 2021  1:35 PM Jae Dire wrote: Reason for CRM:  Left message for patient to call back and schedule Medicare Annual Wellness Visit (AWV) in office.   If unable to come into the office for AWV,  please offer to do virtually or by telephone.  Last AWV: 06/09/2020  Please schedule at anytime with [facility] Health Advisor.  30 minute appointment for Virtual or phone 45 minute appointment for in office or Initial virtual/phone  Any questions, please contact me at [phone]

## 2021-11-14 ENCOUNTER — Other Ambulatory Visit (HOSPITAL_COMMUNITY): Payer: Self-pay | Admitting: Nurse Practitioner

## 2021-11-15 ENCOUNTER — Encounter: Payer: Self-pay | Admitting: Family

## 2021-11-15 ENCOUNTER — Ambulatory Visit: Payer: Medicare PPO | Admitting: Family

## 2021-11-15 VITALS — BP 109/58 | HR 63 | Temp 98.2°F | Ht 66.0 in | Wt 150.1 lb

## 2021-11-15 DIAGNOSIS — R040 Epistaxis: Secondary | ICD-10-CM

## 2021-11-15 NOTE — Progress Notes (Signed)
Patient ID: Ronnie Black, male    DOB: [birth year], 76 y.o.   MRN: [id number]  Chief Complaint  Patient presents with   Epistaxis    Ronnie Black c/o nose bleeds,     HPI:     Nose bleeds:  [place] and started having the bleeding from his left nostril, Ronnie Black did not have any nasal saline with him but used a chemical packing which did eventually help. Had same problem back in April and was cauterized in office by PCP and advised to use Afrin nasal spray if bleeding reoccurred, but Ronnie Black did not use as it did not say specifically for nose bleeds.  Reports bleeding mostly from left nare, but sometimes on right side also.     Assessment & Plan:  1. Epistaxis - Consulted with Ronnie Black PCP, Dr.Parker, verbal consent was obtained after explaining the procedure including alternatives, risk, benefits.  The area of bleeding was easily identified via otoscope.  Area was cauterized with topical silver nitrate.  Visible eschar was present after cauterization.  Ronnie Black tolerated well with mild bleeding. Hemostasis was observed at the end of the visit.  Ronnie Black advised again on using saline nasal spray tid prn, generic Afrin spray if bleeding returns and using  OTC packing gauze as needed.   - Epistaxis management   Subjective:    Outpatient Medications Prior to Visit  Medication Sig Dispense Refill   atorvastatin (LIPITOR) 10 MG tablet TAKE 1 TABLET BY MOUTH EVERY DAY (Patient taking differently: at bedtime.) 90 tablet 3   dofetilide (TIKOSYN) 500 MCG capsule TAKE 1 CAPSULE BY MOUTH TWICE A DAY 180 capsule 3   ELIQUIS 5 MG TABS tablet TAKE 1 TABLET BY MOUTH TWICE A DAY 180 tablet 1   fluticasone (CUTIVATE) 0.005 % ointment Apply 1 application. topically 2 (two) times daily. (Patient taking differently: Apply 1 application  topically as needed.) 30 g 0   ibuprofen (ADVIL) 200 MG tablet Take 400 mg by mouth daily as needed for mild pain.     influenza vaccine adjuvanted (FLUAD) 0.5 ML injection Inject into the  muscle. 0.5 mL 0   losartan (COZAAR) 25 MG tablet Take 1 tablet (25 mg total) by mouth daily. 30 tablet 6   melatonin 5 MG TABS Take 5 mg by mouth at bedtime.     metoprolol tartrate (LOPRESSOR) 25 MG tablet TAKE 1 TABLET BY MOUTH TWICE A DAY 180 tablet 2   Multiple Vitamin (MULTIVITAMIN WITH MINERALS) TABS tablet Take 1 tablet by mouth 3 (three) times a week.     RSV vaccine recomb adjuvanted (AREXVY) 120 MCG/0.5ML injection Inject into the muscle. 0.5 mL 0   sodium chloride (OCEAN) 0.65 % SOLN nasal spray Place 1 spray into both nostrils as needed (dryness).     tamsulosin (FLOMAX) 0.4 MG CAPS capsule Take 0.4 mg by mouth daily.     No facility-administered medications prior to visit.   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   Persistent atrial fibrillation (Ronnie Black)    s/p PVI 3/12   Prostate cancer (Ronnie Black) 05/08/2021   04/2021   Past Surgical History:  Procedure Laterality Date   APPENDECTOMY  1970   atrial fibrillation  ablation  03/27/2010   PVI with CTI ablation by [place] N/A 11/21/2016   Procedure: ATRIAL FIBRILLATION ABLATION;  Surgeon: [name], MD;  Location: [facility];  Service: Cardiovascular;  Laterality: N/A;   CARDIOVERSION     CARDIOVERSION  04/26/2011   Procedure: CARDIOVERSION;  Surgeon: Larey Dresser, MD;  Location: [place];  Service: Cardiovascular;  Laterality: N/A;   CARDIOVERSION N/A 04/28/2012   Procedure: CARDIOVERSION;  Surgeon: Thayer Headings, MD;  Location: [place];  Service: Cardiovascular;  Laterality: N/A;   CARDIOVERSION N/A 07/28/2012   Procedure: CARDIOVERSION;  Surgeon: Lelon Perla, MD;  Location: [facility] ENDOSCOPY;  Service: Cardiovascular;  Laterality:  N/A;   CARDIOVERSION N/A 08/14/2012   Procedure: CARDIOVERSION;  Surgeon: Josue Hector, MD;  Location: [place];  Service: Cardiovascular;  Laterality: N/A;   CARDIOVERSION N/A 08/11/2014   Procedure: CARDIOVERSION;  Surgeon: Fay Records, MD;  Location: [place];  Service: Cardiovascular;  Laterality: N/A;   CARDIOVERSION N/A 08/26/2014   Procedure: CARDIOVERSION;  Surgeon: Sanda Klein, MD;  Location: [facility] ENDOSCOPY;  Service: Cardiovascular;  Laterality: N/A;   CARDIOVERSION N/A 10/10/2016   Procedure: CARDIOVERSION;  Surgeon: Larey Dresser, MD;  Location: [facility] ENDOSCOPY;  Service: Cardiovascular;  Laterality: N/A;   CARDIOVERSION N/A 12/04/2016   Procedure: CARDIOVERSION;  Surgeon: Larey Dresser, MD;  Location: [facility] ENDOSCOPY;  Service: Cardiovascular;  Laterality: N/A;   CARDIOVERSION N/A 06/09/2018   Procedure: CARDIOVERSION;  Surgeon: Dorothy Spark, MD;  Location: [facility] ENDOSCOPY;  Service: Cardiovascular;  Laterality: N/A;   CARDIOVERSION N/A 08/17/2019   Procedure: CARDIOVERSION;  Surgeon: [name], MD;  Location: [place];  Service: Cardiovascular;  Laterality: N/A;   CARDIOVERSION N/A 11/18/2019   Procedure: CARDIOVERSION;  Surgeon: [name], MD;  Location: [place];  Service: Cardiovascular;  Laterality: N/A;   CARDIOVERSION N/A 03/09/2020   Procedure: CARDIOVERSION;  Surgeon: [name], MD;  Location: [place];  Service: Cardiovascular;  Laterality: N/A;   RADIOACTIVE SEED IMPLANT N/A 08/06/2021   Procedure: RADIOACTIVE SEED IMPLANT/BRACHYTHERAPY IMPLANT;  Surgeon: [name], MD;  Location: [facility];  Service: Urology;  Laterality: N/A;   SKIN CANCER EXCISION     SPACE OAR INSTILLATION N/A 08/06/2021   Procedure: SPACE OAR INSTILLATION;  Surgeon: [name], MD;  Location: [facility];  Service: Urology;  Laterality: N/A;   TEE WITHOUT CARDIOVERSION  04/26/2011   Procedure:  TRANSESOPHAGEAL ECHOCARDIOGRAM (TEE);  Surgeon: Larey Dresser, MD;  Location: [facility] ENDOSCOPY;  Service: Cardiovascular;  Laterality: N/A;  to be done at 1330   TEE WITHOUT CARDIOVERSION N/A 04/28/2012   Procedure: TRANSESOPHAGEAL ECHOCARDIOGRAM (TEE);  Surgeon: Thayer Headings, MD;  Location: [place];  Service: Cardiovascular;  Laterality: N/A;   WISDOM TOOTH EXTRACTION     yrs ago   No Known Allergies    Objective:    Physical Exam Vitals and nursing note reviewed.  Constitutional:      General: Ronnie Black is not in acute distress.    Appearance: Normal appearance.  HENT:     Head: Normocephalic.     Nose:     Right Nostril: Epistaxis present.     Left Nostril: Epistaxis present.  Cardiovascular:     Rate and Rhythm: Normal rate and regular rhythm.  Pulmonary:     Effort: Pulmonary effort is normal.     Breath sounds: Normal breath sounds.  Musculoskeletal:  General: Normal range of motion.     Cervical back: Normal range of motion.  Skin:    General: Skin is warm and dry.  Neurological:     Mental Status: Ronnie Black is alert and oriented to person, place, and time.  Psychiatric:        Mood and Affect: Mood normal.    BP (!) 109/58 (BP Location: Left Arm, Patient Position: Sitting, Cuff Size: Large)   Pulse 63   Temp 98.2 F (36.8 C) (Temporal)   Ht '5\' 6"'$  (1.676 m)   Wt 150 lb 2 oz (68.1 kg)   SpO2 96%   BMI 24.23 kg/m  Wt Readings from Last 3 Encounters:  11/15/21 150 lb 2 oz (68.1 kg)  09/18/21 152 lb 3.2 oz (69 kg)  08/30/21 150 lb (68 kg)       Jeanie Sewer, NP

## 2021-11-16 ENCOUNTER — Other Ambulatory Visit: Payer: Self-pay | Admitting: Cardiology

## 2021-11-16 NOTE — Telephone Encounter (Signed)
Prescription refill request for Eliquis received.  Indication: afib  Last office visit: Kayleen Memos 09/18/2021 Scr: 0.85, 09/27/2021 Age: 76 yo  Weight: 68.1 kg   Refill sent.

## 2021-12-06 ENCOUNTER — Encounter: Payer: Medicare PPO | Admitting: Family Medicine

## 2021-12-06 ENCOUNTER — Encounter: Payer: Self-pay | Admitting: Family Medicine

## 2021-12-06 ENCOUNTER — Ambulatory Visit (INDEPENDENT_AMBULATORY_CARE_PROVIDER_SITE_OTHER): Payer: Medicare PPO | Admitting: Family Medicine

## 2021-12-06 VITALS — BP 124/70 | HR 62 | Temp 97.8°F | Ht 66.0 in | Wt 152.8 lb

## 2021-12-06 DIAGNOSIS — R040 Epistaxis: Secondary | ICD-10-CM

## 2021-12-06 DIAGNOSIS — I10 Essential (primary) hypertension: Secondary | ICD-10-CM

## 2021-12-06 DIAGNOSIS — M160 Bilateral primary osteoarthritis of hip: Secondary | ICD-10-CM

## 2021-12-06 DIAGNOSIS — C61 Malignant neoplasm of prostate: Secondary | ICD-10-CM | POA: Diagnosis not present

## 2021-12-06 DIAGNOSIS — I4891 Unspecified atrial fibrillation: Secondary | ICD-10-CM

## 2021-12-06 DIAGNOSIS — Z7901 Long term (current) use of anticoagulants: Secondary | ICD-10-CM | POA: Diagnosis not present

## 2021-12-06 DIAGNOSIS — M6752 Plica syndrome, left knee: Secondary | ICD-10-CM

## 2021-12-06 DIAGNOSIS — Z0000 Encounter for general adult medical examination without abnormal findings: Secondary | ICD-10-CM

## 2021-12-06 DIAGNOSIS — E782 Mixed hyperlipidemia: Secondary | ICD-10-CM | POA: Diagnosis not present

## 2021-12-06 DIAGNOSIS — I421 Obstructive hypertrophic cardiomyopathy: Secondary | ICD-10-CM | POA: Diagnosis not present

## 2021-12-06 DIAGNOSIS — J309 Allergic rhinitis, unspecified: Secondary | ICD-10-CM

## 2021-12-06 LAB — CBC WITH DIFFERENTIAL/PLATELET
Basophils Absolute: 0 10*3/uL (ref 0.0–0.1)
Basophils Relative: 0.9 % (ref 0.0–3.0)
Eosinophils Absolute: 0.1 10*3/uL (ref 0.0–0.7)
Eosinophils Relative: 2.2 % (ref 0.0–5.0)
HCT: 46.1 % (ref 39.0–52.0)
Hemoglobin: 15.5 g/dL (ref 13.0–17.0)
Lymphocytes Relative: 36.5 % (ref 12.0–46.0)
Lymphs Abs: 1.7 10*3/uL (ref 0.7–4.0)
MCHC: 33.6 g/dL (ref 30.0–36.0)
MCV: 87 fl (ref 78.0–100.0)
Monocytes Absolute: 0.4 10*3/uL (ref 0.1–1.0)
Monocytes Relative: 9.2 % (ref 3.0–12.0)
Neutro Abs: 2.3 10*3/uL (ref 1.4–7.7)
Neutrophils Relative %: 51.2 % (ref 43.0–77.0)
Platelets: 196 10*3/uL (ref 150.0–400.0)
RBC: 5.3 Mil/uL (ref 4.22–5.81)
RDW: 14.2 % (ref 11.5–15.5)
WBC: 4.6 10*3/uL (ref 4.0–10.5)

## 2021-12-06 LAB — LIPID PANEL
Cholesterol: 135 mg/dL (ref 0–200)
HDL: 46.6 mg/dL (ref >39.00)
LDL Cholesterol: 75 mg/dL (ref 0–99)
NonHDL: 88.76
Total CHOL/HDL Ratio: 3
Triglycerides: 68 mg/dL (ref 0.0–149.0)
VLDL: 13.6 mg/dL (ref 0.0–40.0)

## 2021-12-06 LAB — COMPREHENSIVE METABOLIC PANEL
ALT: 31 U/L (ref 0–53)
AST: 22 U/L (ref 0–37)
Albumin: 4.4 g/dL (ref 3.5–5.2)
Alkaline Phosphatase: 57 U/L (ref 39–117)
BUN: 13 mg/dL (ref 6–23)
CO2: 33 mEq/L — ABNORMAL HIGH (ref 19–32)
Calcium: 9.3 mg/dL (ref 8.4–10.5)
Chloride: 102 mEq/L (ref 96–112)
Creatinine, Ser: 0.85 mg/dL (ref 0.40–1.50)
GFR: 83.22 mL/min (ref >60.00)
Glucose, Bld: 89 mg/dL (ref 70–99)
Potassium: 4.6 mEq/L (ref 3.5–5.1)
Sodium: 140 mEq/L (ref 135–145)
Total Bilirubin: 0.8 mg/dL (ref 0.2–1.2)
Total Protein: 6.4 g/dL (ref 6.0–8.3)

## 2021-12-06 LAB — TSH: TSH: 4.07 u[IU]/mL (ref 0.35–5.50)

## 2021-12-06 MED ORDER — FLUTICASONE PROPIONATE 50 MCG/ACT NA SUSP: 1.0000 | Freq: Every day | NASAL | 6 refills | Status: DC

## 2021-12-06 MED ORDER — ATORVASTATIN CALCIUM 10 MG PO TABS: 10.0000 mg | ORAL_TABLET | Freq: Every day | ORAL | Status: DC

## 2021-12-06 MED ORDER — FLUTICASONE PROPIONATE 0.005 % EX OINT
1.0000 | TOPICAL_OINTMENT | CUTANEOUS | Status: AC | PRN
Start: 2021-12-06 — End: ?

## 2021-12-06 NOTE — Patient Instructions (Signed)
Please return in 12 months for your annual complete physical; please come fasting.   I will release your lab results to you on your MyChart account with further instructions. You may see the results before I do, but when I review them I will send you a message with my report or have my assistant call you if things need to be discussed. Please reply to my message with any questions. Thank you!   If you have any questions or concerns, please don't hesitate to send me a message via MyChart or call the office at [phone]. Thank you for visiting with Ronnie Black today! It's our pleasure caring for you.   Plica Syndrome  Plica syndrome is a painful knee condition. Plica syndrome happens when folds of tissue in the knee (plica) get swollen and rub against the knee cap (patella) or thigh bone (femur). What are the causes? This condition may be caused by: Bending or twisting the knee over and over again. A hit to the knee. What increases the risk? The following factors may make you more likely to develop this condition: Having hip or thigh muscles that are weak or tight. Having hip or foot problems that change the normal position of the knee. Having had a previous knee injury. Playing contact sports. Participating in activities that involve making the same knee movements over and over, such as running, cycling, or swimming. What are the signs or symptoms? The main symptom of this condition is a dull pain in the front or side of the knee. The pain comes and goes. It may get better with rest and worse with activities such as: Standing. Kneeling. Walking. Running. Climbing stairs. Other symptoms of this condition include: Swelling of the knee. Pain when pressing on your knee. A clicking or snapping feeling when you bend your knee. A feeling that your knee is locking or catching. A feeling like your knee is giving way (instability). How is this diagnosed? This condition is diagnosed based on: Your  medical history. A physical exam. Your health care provider may move your knee in different directions and feel your knee to check for pain and tenderness. Imaging studies may be used. They include MRI and ultrasound. A procedure to look inside your knee joint (arthroscopy). This is rarely used and may be done when your condition has not improved after treatments. How is this treated? This condition may be treated by: Resting your knee until pain and swelling go down. Avoiding activities that make pain worse. Icing your knee. Wearing a supportive sleeve around your knee. Taking medicine to reduce pain and inflammation. Getting injections in your knee. Starting exercises to help you regain full movement (physical therapy) and strengthen your thigh muscles. If these treatments do not help after 6 months, you may need to have surgery to remove the swollen parts of your plica. Follow these instructions at home: If you have a sleeve: Wear the sleeve as told by your health care provider. Remove it only as told by your health care provider. Check the skin around the sleeve every day. Tell your health care provider about any concerns. Remove the sleeve if your toes tingle, become numb, or turn cold and blue. Keep the sleeve clean and dry. If the sleeve is not waterproof: Do not let it get wet. Cover it with a waterproof covering when you take a bath or shower. Managing pain, stiffness, and swelling  If directed, put ice on your knee. Put ice in a plastic bag. Place a  towel between your skin and the bag. Leave the ice on for 20 minutes, 2-3 times a day. Remove the ice if your skin turns bright red. This is very important. If you cannot feel pain, heat, or cold, you have a greater risk of damage to the area. Raise (elevate) your knee above the level of your heart while you are sitting or lying down. Driving Ask your health care provider if the medicine prescribed to you requires you to avoid  driving or using heavy machinery. Ask your health care provider when it is safe to drive if you have a sleeve on your knee. Activity Do exercises as told by your health care provider. Return to your normal activities as told by your health care provider. Ask your health care provider what activities are safe for you. General instructions Take over-the-counter and prescription medicines as told by your health care provider. Do not use any products that contain nicotine or tobacco, such as cigarettes, e-cigarettes, and chewing tobacco. If you need help quitting, ask your health care provider. Keep all follow-up visits. This is important. How is this prevented? Warm up and stretch before being active. Cool down and stretch after being active. Give your body time to rest between periods of activity. Maintain physical fitness, including: Strength. Flexibility. Be safe and responsible while being active. This will help you to avoid falls. Contact a health care provider if: Your symptoms get worse. Your symptoms have not improved after 6 months. Summary Plica syndrome is a painful knee condition. Plica syndrome happens when folds of tissue in the knee (plica) get swollen and rub against the patella or the femur. This condition may be caused by repeatedly bending or twisting the knee or by a hit to the knee. This condition is treated with rest, ice, medicines, a supportive sleeve, physical therapy, and surgery if needed. This information is not intended to replace advice given to you by your health care provider. Make sure you discuss any questions you have with your health care provider. Document Revised: 12/10/2020 Document Reviewed: 12/10/2020 Elsevier Patient Education  [place].

## 2021-12-06 NOTE — Progress Notes (Signed)
Subjective  Chief Complaint  Patient presents with   Annual Exam    Pt here for Annual exam and is currently fasting      HPI: Ronnie Black is a 76 y.o. male who presents to [facility] today for a Male Wellness Visit. He also has the concerns and/or needs as listed above in the chief complaint. These will be addressed in addition to the Health Maintenance Visit.   Wellness Visit: annual visit with health maintenance review and exam   HM: due colonoscopy next year. Active. Traveling. Exercising. Eats well. Imms up to date  Body mass index is 24.66 kg/m. Wt Readings from Last 3 Encounters:  12/06/21 152 lb 12.8 oz (69.3 kg)  11/15/21 150 lb 2 oz (68.1 kg)  09/18/21 152 lb 3.2 oz (69 kg)     Chronic disease management visit and/or acute problem visit: Epistaxis: had another episode; note reviewed. C/o PND, mild rhinorrhea and rare sneezing. On blood thinner. CARDs: reviewed notes. Stable HOCM, NSR on tikosyn for h/o chronic afib and normotensive on bb. C/o left medial knee pain; mild discomfort with walking. No swelling, locking or giveway. No trauma. Reviewed xray 2022: no OA changes. Had baker's cyst rupture last year Hip OA: s/p steroid injection,DR. Ninfa Linden. Stable. Severe and can be replaced in future if needed: pain or gait problems.  Prostate CA: new dx this year. S/p treatment and doing well.  HLD On statin well tolerated and due for recheck.   Patient Active Problem List   Diagnosis Date Noted   Aortic atherosclerosis ([place]) 05/22/2021   Malignant neoplasm of prostate ([place]) 05/08/2021   Essential hypertension 12/01/2019   Current use of long term anticoagulation 11/06/2018   Atrial fibrillation status post cardioversion ([place]) 02/24/2017   Family history of colon cancer 10/01/2012   Mixed hyperlipidemia 08/13/2008   HOCM (hypertrophic obstructive cardiomyopathy) ([place]) 08/13/2008   Bilateral primary osteoarthritis of hip 12/04/2020    Persistent atrial fibrillation ([place]) 11/21/2016   Benign prostatic hyperplasia without lower urinary tract symptoms 11/07/2016   Herniated lumbar disc without myelopathy 07/08/2012   ED (erectile dysfunction) 01/07/2012   Actinic keratosis 01/07/2012   Seborrheic keratosis 01/07/2012   CAROTID BRUIT 08/24/2008   Health Maintenance  Topic Date Due   Medicare Annual Wellness (AWV)  06/09/2021   INFLUENZA VACCINE  08/21/2021   COVID-19 Vaccine (5 - Moderna risk series) 12/22/2021 (Originally 12/28/2020)   COLONOSCOPY (Pts 45-22yr Insurance coverage will need to be confirmed)  05/07/2022   TETANUS/TDAP  11/08/2026   Pneumonia Vaccine 78 Years old  Completed   Hepatitis C Screening  Completed   Zoster Vaccines- Shingrix  Completed   HPV VACCINES  Aged Out   Immunization History  Administered Date(s) Administered   Fluad Quad(high Dose 65+) 11/02/2019, 10/04/2020   Influenza, High Dose Seasonal PF 11/02/2015, 11/07/2016, 11/13/2017   Influenza, Seasonal, Injecte, Preservative Fre 11/04/2013   Moderna Covid-19 Vaccine Bivalent Booster 141yr& up 11/02/2020   Moderna SARS-COV2 Booster Vaccination 06/29/2020   Moderna Sars-Covid-2 Vaccination 03/03/2019, 03/24/2019, 12/07/2019   Pneumococcal Conjugate-13 12/02/2013   Pneumococcal Polysaccharide-23 01/07/2012   Tdap 11/07/2016   Zoster Recombinat (Shingrix) 12/04/2018, 06/03/2019   Zoster, Live 01/22/2010   We updated and reviewed the patient's past history in detail and it is documented below. Allergies: Patient has No Known Allergies. Past Medical History  has a past medical history of Complication of anesthesia, Current use of long term anticoagulation (11/06/2018), DDD (degenerative disc disease), HLD (  hyperlipidemia), Hypertension, Hypertr obst cardiomyop, Persistent atrial fibrillation ([place]), and Prostate cancer ([place]) (05/08/2021). Past Surgical History Patient  has a past surgical history that includes Appendectomy (1970);  atrial fibrillation ablation (03/27/2010); Wisdom tooth extraction; Skin cancer excision; Cardioversion; TEE without cardioversion (04/26/2011); Cardioversion (04/26/2011); TEE without cardioversion (N/A, 04/28/2012); Cardioversion (N/A, 04/28/2012); Cardioversion (N/A, 07/28/2012); Cardioversion (N/A, 08/14/2012); Cardioversion (N/A, 08/11/2014); Cardioversion (N/A, 08/26/2014); Cardioversion (N/A, 10/10/2016); ATRIAL FIBRILLATION ABLATION (N/A, 11/21/2016); Cardioversion (N/A, 12/04/2016); Cardioversion (N/A, 06/09/2018); Cardioversion (N/A, 08/17/2019); Cardioversion (N/A, 11/18/2019); Cardioversion (N/A, 03/09/2020); Radioactive seed implant (N/A, 08/06/2021); and SPACE OAR INSTILLATION (N/A, 08/06/2021). Social History Patient  reports that he quit smoking about 46 years ago. His smoking use included cigarettes. He has a 7.50 pack-year smoking history. He has never used smokeless tobacco. He reports current alcohol use of about 6.0 standard drinks of alcohol per week. He reports that he does not use drugs. Family History family history includes Colon cancer in his father and mother; Coronary artery disease in an other family member; Lung cancer in his mother. Review of Systems: Constitutional: negative for fever or malaise Ophthalmic: negative for photophobia, double vision or loss of vision Cardiovascular: negative for chest pain, dyspnea on exertion, or new LE swelling Respiratory: negative for SOB or persistent cough Gastrointestinal: negative for abdominal pain, change in bowel habits or melena Genitourinary: negative for dysuria or gross hematuria Musculoskeletal: negative for new gait disturbance or muscular weakness Integumentary: negative for new or persistent rashes Neurological: negative for TIA or stroke symptoms Psychiatric: negative for SI or delusions Allergic/Immunologic: negative for hives  Patient Care Team    Relationship Specialty Notifications Start End  [name], MD  PCP - General Family Medicine  11/06/18   Vickie Epley, MD PCP - Electrophysiology Cardiology  04/03/21   Vickie Epley, MD PCP - Cardiology Cardiology  05/22/21   [name], MD  Cardiology  12/10/10   [facility] Consulting Physician Urology  11/06/18   Laurin Coder, MD Consulting Physician Pulmonary Disease  11/06/18   Richmond Campbell, MD Consulting Physician Gastroenterology  12/01/19   [name], MD Consulting Physician Urology  03/09/21    Objective  Vitals: BP 124/70   Pulse 62   Temp 97.8 F (36.6 C)   Ht '5\' 6"'$  (1.676 m)   Wt 152 lb 12.8 oz (69.3 kg)   SpO2 97%   BMI 24.66 kg/m  General:  Well developed, well nourished, no acute distress  Psych:  Alert and orientedx3,normal mood and affect HEENT:  Normocephalic, atraumatic, non-icteric sclera, PERRL, oropharynx is clear without mass or exudate, edematous nasal mucosa with clear rhinorrhea, Tms with serous effusions, supple neck without adenopathy, mass or thyromegaly Cardiovascular:  Normal S1, S2, RRR Respiratory:  Good breath sounds bilaterally, CTAB with normal respiratory effort Gastrointestinal: normal bowel sounds, soft, non-tender, no noted masses. No HSM MSK: left knee: medial joint line ttp w/o crepitus, effusion or warmth. FROM. Nl gait Skin:  Warm, no rashes or suspicious lesions noted Neurologic:    Mental status is normal. CN 2-11 are normal. Gross motor and sensory exams are normal. Stable gait. No tremor GU: No inguinal hernias or adenopathy are appreciated bilaterally   Assessment  1. Annual physical exam   2. HOCM (hypertrophic obstructive cardiomyopathy) ([place])   3. Atrial fibrillation status post cardioversion ([place])   4. Essential hypertension   5. Current use of long term anticoagulation   6. Mixed hyperlipidemia   7. Malignant neoplasm of prostate ([place])  8. Bilateral primary osteoarthritis of hip   9. Plica syndrome of knee, left      Plan  Male  Wellness Visit: Age appropriate Health Maintenance and Prevention measures were discussed with patient. Included topics are cancer screening recommendations, ways to keep healthy (see AVS) including dietary and exercise recommendations, regular eye and dental care, use of seat belts, and avoidance of moderate alcohol use and tobacco use. Screens current. Colonoscopy due 2024 BMI: discussed patient's BMI and encouraged positive lifestyle modifications to help get to or maintain a target BMI. HM needs and immunizations were addressed and ordered. See below for orders. See HM and immunization section for updates. utd Routine labs and screening tests ordered including cmp, cbc and lipids where appropriate. Discussed recommendations regarding Vit D and calcium supplementation (see AVS)  Chronic disease f/u and/or acute problem visit: (deemed necessary to be done in addition to the wellness visit): HTN/HOCM/h/o afib: all stable and controlled. Continue: metoprolol 25 bid, losartan 25 daily, tikosy 500 bid and eloquis bid.  Epistaxis and AR: on blood thinners; rec trial of flonase; can add oral antihistamine or astelin if not improving. Afrin and pressure for nose bleeds. To ENT if worsen HLD: on atrovastatin 10, recheck lipids and lfts  Prostate CA s/p brachitx.  Hip OA: stable after steroid injection. Monitor.  Knee pain:medial: ? Plica, ice, rest and monitor.   Follow up: 12 mo for cpe  Commons side effects, risks, benefits, and alternatives for medications and treatment plan prescribed today were discussed, and the patient expressed understanding of the given instructions. Patient is instructed to call or message via MyChart if he/she has any questions or concerns regarding our treatment plan. No barriers to understanding were identified. We discussed Red Flag symptoms and signs in detail. Patient expressed understanding regarding what to do in case of urgent or emergency type symptoms.  Medication  list was reconciled, printed and provided to the patient in AVS. Patient instructions and summary information was reviewed with the patient as documented in the AVS. This note was prepared with assistance of Dragon voice recognition software. Occasional wrong-word or sound-a-like substitutions may have occurred due to the inherent limitations of voice recognition software    Orders Placed This Encounter  Procedures   CBC with Differential/Platelet   Comprehensive metabolic panel   Lipid panel   TSH   Meds ordered this encounter  Medications   fluticasone (FLONASE) 50 MCG/ACT nasal spray    Sig: Place 1 spray into both nostrils daily.    Dispense:  16 g    Refill:  6   fluticasone (CUTIVATE) 0.005 % ointment    Sig: Apply 1 Application topically as needed.   atorvastatin (LIPITOR) 10 MG tablet    Sig: Take 1 tablet (10 mg total) by mouth at bedtime.

## 2022-01-03 ENCOUNTER — Ambulatory Visit: Payer: Medicare PPO | Admitting: Family Medicine

## 2022-01-03 ENCOUNTER — Encounter: Payer: Self-pay | Admitting: Family Medicine

## 2022-01-03 VITALS — BP 119/78 | HR 65 | Temp 97.2°F | Wt 152.6 lb

## 2022-01-03 DIAGNOSIS — R040 Epistaxis: Secondary | ICD-10-CM

## 2022-01-03 NOTE — Progress Notes (Signed)
Subjective  CC:  Chief Complaint  Patient presents with   Epistaxis    Almost daily had trouble getting it to stop last night    HPI: Ronnie Black is a 76 y.o. male who presents to the office today to address the problems listed above in the chief complaint. 76 year old male with history of A-fib on Eliquis 5 mg twice daily, hypertension and HOCM presents due to recurrent epistaxis.  I reviewed notes he has had care in our office.  He was here last month for his physical and we discussed it.  Since he has had multiple small nasal bleeds mostly from the left upper nasal passage that he could control easily.  Last night he had a more significant nasal bleed which he thinks was coming from the anterior part of his left nostril.  However, it took him 10 to 15 minutes to get it to stop.  He is using nasal Afrin as needed.  Chronically he is using a nasal saline spray and for the last several weeks we started Flonase.  Neither of these things have been helpful.  He does have a good humidifier in his house.  He has no chronic allergy symptoms.  No nasal pain or purulent drainage.  Blood pressure has been well-controlled.  Assessment  1. Recurrent epistaxis      Plan  Recurrent epistaxis: Discussed problem, persist.  Fortunately symptoms are mild although irritated.  Today we cauterized the anterior chamber (kiesselbach's plexus) of his left nasal passage/septum. He tolerated this well.  We will use a small amount of Vaseline on the septum daily.  Refer to ENT for full nasal evaluation given recurrent problem.  Suspect issue is tied to his daily Eliquis use.  Follow up: As needed Visit date not found  Orders Placed This Encounter  Procedures   Ambulatory referral to ENT   No orders of the defined types were placed in this encounter.     I reviewed the patients updated PMH, FH, and SocHx.    Patient Active Problem List   Diagnosis Date Noted   Aortic atherosclerosis ([place]) 05/22/2021     Priority: High   Malignant neoplasm of prostate ([place]) 05/08/2021    Priority: High   Essential hypertension 12/01/2019    Priority: High   Current use of long term anticoagulation 11/06/2018    Priority: High   Atrial fibrillation status post cardioversion ([place]) 02/24/2017    Priority: High   Family history of colon cancer 10/01/2012    Priority: High   Mixed hyperlipidemia 08/13/2008    Priority: High   HOCM (hypertrophic obstructive cardiomyopathy) ([place]) 08/13/2008    Priority: High   Bilateral primary osteoarthritis of hip 12/04/2020    Priority: Medium    Persistent atrial fibrillation ([place]) 11/21/2016    Priority: Medium    Benign prostatic hyperplasia without lower urinary tract symptoms 11/07/2016    Priority: Medium    Herniated lumbar disc without myelopathy 07/08/2012    Priority: Medium    ED (erectile dysfunction) 01/07/2012    Priority: Low   Actinic keratosis 01/07/2012    Priority: Low   Seborrheic keratosis 01/07/2012    Priority: Low   CAROTID BRUIT 08/24/2008    Priority: Low   Current Meds  Medication Sig   atorvastatin (LIPITOR) 10 MG tablet Take 1 tablet (10 mg total) by mouth at bedtime.   dofetilide (TIKOSYN) 500 MCG capsule TAKE 1 CAPSULE BY MOUTH TWICE A DAY   ELIQUIS 5 MG  TABS tablet TAKE 1 TABLET BY MOUTH TWICE A DAY   fluticasone (CUTIVATE) 0.005 % ointment Apply 1 Application topically as needed.   ibuprofen (ADVIL) 200 MG tablet Take 400 mg by mouth daily as needed for mild pain.   losartan (COZAAR) 25 MG tablet Take 1 tablet (25 mg total) by mouth daily.   melatonin 5 MG TABS Take 5 mg by mouth at bedtime.   metoprolol tartrate (LOPRESSOR) 25 MG tablet TAKE 1 TABLET BY MOUTH TWICE A DAY   Multiple Vitamin (MULTIVITAMIN WITH MINERALS) TABS tablet Take 1 tablet by mouth 3 (three) times a week.   sodium chloride (OCEAN) 0.65 % SOLN nasal spray Place 1 spray into both nostrils as needed (dryness).   tamsulosin (FLOMAX) 0.4 MG CAPS capsule  Take 0.4 mg by mouth daily.   [DISCONTINUED] fluticasone (FLONASE) 50 MCG/ACT nasal spray Place 1 spray into both nostrils daily.    Allergies: Patient has No Known Allergies. Family History: Patient family history includes Colon cancer in his father and mother; Coronary artery disease in an other family member; Lung cancer in his mother. Social History:  Patient  reports that he quit smoking about 46 years ago. His smoking use included cigarettes. He has a 7.50 pack-year smoking history. He has never used smokeless tobacco. He reports current alcohol use of about 6.0 standard drinks of alcohol per week. He reports that he does not use drugs.  Review of Systems: Constitutional: Negative for fever malaise or anorexia Cardiovascular: negative for chest pain Respiratory: negative for SOB or persistent cough Gastrointestinal: negative for abdominal pain  Objective  Vitals: BP 119/78   Pulse 65   Temp (!) 97.2 F (36.2 C)   Wt 152 lb 9.6 oz (69.2 kg)   SpO2 98%   BMI 24.63 kg/m  General: no acute distress , A&Ox3 HEENT: Nasal mucosa is thin, enlarged Kiesselbach's plexus on the left.  No notable lesions or masses.  Minimal active bleeding.  No purulent drainage.  No inflammation.  Procedure note: Chemical cautery on nasal mucosa With patient lying back upright on exam table, silver nitrate stick was used to chemically cauterize Kiesselbach's  plexus on the left.  No lesions.  He tolerated this procedure well.  Commons side effects, risks, benefits, and alternatives for medications and treatment plan prescribed today were discussed, and the patient expressed understanding of the given instructions. Patient is instructed to call or message via MyChart if he/she has any questions or concerns regarding our treatment plan. No barriers to understanding were identified. We discussed Red Flag symptoms and signs in detail. Patient expressed understanding regarding what to do in case of urgent or  emergency type symptoms.  Medication list was reconciled, printed and provided to the patient in AVS. Patient instructions and summary information was reviewed with the patient as documented in the AVS. This note was prepared with assistance of Dragon voice recognition software. Occasional wrong-word or sound-a-like substitutions may have occurred due to the inherent limitations of voice recognition software  This visit occurred during the SARS-CoV-2 public health emergency.  Safety protocols were in place, including screening questions prior to the visit, additional usage of staff PPE, and extensive cleaning of exam room while observing appropriate contact time as indicated for disinfecting solutions.

## 2022-01-03 NOTE — Patient Instructions (Signed)
Please follow up if symptoms do not improve or as needed.    I have placed a referral to Dr. Erik Obey; this is who your wife saw. We will call you to get this set up.

## 2022-01-16 ENCOUNTER — Encounter: Payer: Self-pay | Admitting: Family Medicine

## 2022-01-17 NOTE — Telephone Encounter (Signed)
Pt returned call to the office. He is asking for a call back

## 2022-01-17 NOTE — Telephone Encounter (Signed)
LVM for pt to let him know that he may have to wait it out until Feb bc the office may be backed up

## 2022-01-30 ENCOUNTER — Encounter: Payer: Self-pay | Admitting: Family Medicine

## 2022-01-30 ENCOUNTER — Ambulatory Visit: Payer: Medicare PPO | Admitting: Family Medicine

## 2022-01-30 VITALS — BP 130/70 | HR 75 | Temp 98.8°F | Ht 66.0 in | Wt 155.0 lb

## 2022-01-30 DIAGNOSIS — M25562 Pain in left knee: Secondary | ICD-10-CM | POA: Diagnosis not present

## 2022-01-30 NOTE — Progress Notes (Signed)
Subjective  CC:  Chief Complaint  Patient presents with   Knee Pain    Pt stated that he has been having some left kneen pain for the past week.    HPI: Ronnie Black is a 77 y.o. male who presents to the office today to address the problems listed above in the chief complaint. 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 Limits walking. No limp. No swelling, redness or warmth, overuse or injury. New problem. Last xray 02/2020 w/o OA changes. No locking or giveway. Took an advil with mild relief. On eloquis.   Assessment  1. Acute pain of left knee      Plan  Acute knee pain:  ? Meniscus related. Non surgical. Trial of tylenol, avoid nsaids due to anticoagulation, and rest. Has appt with ortho next week for hip DJD. Will ask to evaluate knee as well. Consider imaging, steroid injection.   Follow up: prn  Visit date not found  No orders of the defined types were placed in this encounter.  No orders of the defined types were placed in this encounter.     I reviewed the patients updated PMH, FH, and SocHx.    Patient Active Problem List   Diagnosis Date Noted   Aortic atherosclerosis ([place]) 05/22/2021    Priority: High   Malignant neoplasm of prostate ([place]) 05/08/2021    Priority: High   Essential hypertension 12/01/2019    Priority: High   Current use of long term anticoagulation 11/06/2018    Priority: High   Atrial fibrillation status post cardioversion ([place]) 02/24/2017    Priority: High   Family history of colon cancer 10/01/2012    Priority: High   Mixed hyperlipidemia 08/13/2008    Priority: High   HOCM (hypertrophic obstructive cardiomyopathy) ([place]) 08/13/2008    Priority: High   Bilateral primary osteoarthritis of hip 12/04/2020    Priority: Medium    Persistent atrial fibrillation ([place]) 11/21/2016    Priority:  Medium    Benign prostatic hyperplasia without lower urinary tract symptoms 11/07/2016    Priority: Medium    Herniated lumbar disc without myelopathy 07/08/2012    Priority: Medium    ED (erectile dysfunction) 01/07/2012    Priority: Low   Actinic keratosis 01/07/2012    Priority: Low   Seborrheic keratosis 01/07/2012    Priority: Low   CAROTID BRUIT 08/24/2008    Priority: Low   Current Meds  Medication Sig   atorvastatin (LIPITOR) 10 MG tablet Take 1 tablet (10 mg total) by mouth at bedtime.   dofetilide (TIKOSYN) 500 MCG capsule TAKE 1 CAPSULE BY MOUTH TWICE A DAY   ELIQUIS 5 MG TABS tablet TAKE 1 TABLET BY MOUTH TWICE A DAY   fluticasone (CUTIVATE) 0.005 % ointment Apply 1 Application topically as needed.   ibuprofen (ADVIL) 200 MG tablet Take 400 mg by mouth daily as needed for mild pain.   losartan (COZAAR) 25 MG tablet Take 1 tablet (25 mg total) by mouth daily.   melatonin 5 MG TABS Take 5 mg by mouth at bedtime.   metoprolol tartrate (LOPRESSOR) 25 MG tablet TAKE 1 TABLET BY MOUTH TWICE A DAY   Multiple Vitamin (MULTIVITAMIN WITH MINERALS) TABS tablet Take 1 tablet by mouth 3 (three) times a week.   sodium chloride (OCEAN) 0.65 % SOLN nasal spray Place  1 spray into both nostrils as needed (dryness).   tamsulosin (FLOMAX) 0.4 MG CAPS capsule Take 0.4 mg by mouth daily.    Allergies: Patient has No Known Allergies. Family History: Patient family history includes Colon cancer in his father and mother; Coronary artery disease in an other family member; Lung cancer in his mother. Social History:  Patient  reports that he quit smoking about 46 years ago. His smoking use included cigarettes. He has a 7.50 pack-year smoking history. He has never used smokeless tobacco. He reports current alcohol use of about 6.0 standard drinks of alcohol per week. He reports that he does not use drugs.  Review of Systems: Constitutional: Negative for fever malaise or anorexia Cardiovascular:  negative for chest pain Respiratory: negative for SOB or persistent cough Gastrointestinal: negative for abdominal pain  Objective  Vitals: BP 130/70   Pulse 75   Temp 98.8 F (37.1 C)   Ht '5\' 6"'$  (1.676 m)   Wt 155 lb (70.3 kg)   SpO2 97%   BMI 25.02 kg/m  General: no acute distress , A&Ox3 Left knee: nl appearing. No crepitus, erythema, or warmth. + tender plica medially. No pes anserine ttp, FROM. Neg joint line ttp. Can squat and step up on table with mild sxs.   Commons side effects, risks, benefits, and alternatives for medications and treatment plan prescribed today were discussed, and the patient expressed understanding of the given instructions. Patient is instructed to call or message via MyChart if he/she has any questions or concerns regarding our treatment plan. No barriers to understanding were identified. We discussed Red Flag symptoms and signs in detail. Patient expressed understanding regarding what to do in case of urgent or emergency type symptoms.  Medication list was reconciled, printed and provided to the patient in AVS. Patient instructions and summary information was reviewed with the patient as documented in the AVS. This note was prepared with assistance of Dragon voice recognition software. Occasional wrong-word or sound-a-like substitutions may have occurred due to the inherent limitations of voice recognition software

## 2022-02-07 ENCOUNTER — Ambulatory Visit: Payer: Medicare PPO | Admitting: Orthopaedic Surgery

## 2022-02-07 DIAGNOSIS — M1611 Unilateral primary osteoarthritis, right hip: Secondary | ICD-10-CM

## 2022-02-07 DIAGNOSIS — M25562 Pain in left knee: Secondary | ICD-10-CM

## 2022-02-07 DIAGNOSIS — G8929 Other chronic pain: Secondary | ICD-10-CM | POA: Diagnosis not present

## 2022-02-07 DIAGNOSIS — M25551 Pain in right hip: Secondary | ICD-10-CM

## 2022-02-07 MED ORDER — METHYLPREDNISOLONE ACETATE 40 MG/ML IJ SUSP
40.0000 mg | INTRAMUSCULAR | Status: AC | PRN
  Administered 2022-02-07: 40 mg via INTRA_ARTICULAR

## 2022-02-07 MED ORDER — LIDOCAINE HCL 1 % IJ SOLN
3.0000 mL | INTRAMUSCULAR | Status: AC | PRN
  Administered 2022-02-07: 3 mL

## 2022-02-07 NOTE — Progress Notes (Signed)
The patient is well-known to me.  He is an active 77 year old and thin individual with well-documented severe end-stage arthritis of his right hip.  He does have significant arthritis still in his left hip but that is always been asymptomatic.  When I saw him in June of last year we talked about hip replacement surgery.  He comes in today at this point wishes to proceed with a right hip replacement.  We did go over again in detail what the surgery involves.  I showed him a hip replacement model and explained in detail the intraoperative and postoperative course and we talked about the risk and benefits of surgery.  On exam his right hip is significantly stiff throughout the arc of motion but he is someone who has significantly high pain tolerance because he allows me to move both hips pretty easily.  He has been dealing there with some chronic left knee issues in the medial aspect of his knee where he has had problems.  I did review previous x-rays of his left knee done last year and is still showed a well-maintained joint space.  Some of this may be coming from his left hip however some of this may be coming just from how he walks offloading his right hip.  I cannot rule out though that he has some type of internal derangement of his left knee.  There is pain along the medial joint line of that knee and the medial femoral condyle.  We talked in length in detail again about hip replacement surgery.  We work on getting him scheduled for right total hip in the near future.  I did feel it was reasonable trying a left knee steroid injection and he agreed to this.  He did tolerate the injection well.  If the left knee does not improve I would definitely recommend a MRI of the left knee.  All question concerns were answered addressed.      Procedure Note  Patient: Ronnie Black             Date of Birth: Oct 31, 1945           MRN: [id number]             Visit Date: 02/07/2022  Procedures: Visit  Diagnoses:  1. Pain in right hip   2. Unilateral primary osteoarthritis, right hip   3. Chronic pain of left knee     Large Joint Inj: L knee on 02/07/2022 4:45 PM Indications: diagnostic evaluation and pain Details: 22 G 1.5 in needle, superolateral approach  Arthrogram: No  Medications: 3 mL lidocaine 1 %; 40 mg methylPREDNISolone acetate 40 MG/ML Outcome: tolerated well, no immediate complications Procedure, treatment alternatives, risks and benefits explained, specific risks discussed. Consent was given by the patient. Immediately prior to procedure a time out was called to verify the correct patient, procedure, equipment, support staff and site/side marked as required. Patient was prepped and draped in the usual sterile fashion.

## 2022-02-08 ENCOUNTER — Other Ambulatory Visit: Payer: Self-pay | Admitting: Cardiology

## 2022-02-08 ENCOUNTER — Other Ambulatory Visit (HOSPITAL_COMMUNITY): Payer: Self-pay | Admitting: Nurse Practitioner

## 2022-02-08 DIAGNOSIS — E782 Mixed hyperlipidemia: Secondary | ICD-10-CM

## 2022-02-12 ENCOUNTER — Encounter: Payer: Medicare PPO | Admitting: Family Medicine

## 2022-02-19 ENCOUNTER — Encounter: Payer: Self-pay | Admitting: Orthopaedic Surgery

## 2022-02-21 ENCOUNTER — Ambulatory Visit: Payer: Self-pay

## 2022-02-21 ENCOUNTER — Encounter: Payer: Self-pay | Admitting: Sports Medicine

## 2022-02-21 ENCOUNTER — Ambulatory Visit (INDEPENDENT_AMBULATORY_CARE_PROVIDER_SITE_OTHER): Payer: Medicare PPO | Admitting: Sports Medicine

## 2022-02-21 DIAGNOSIS — M25551 Pain in right hip: Secondary | ICD-10-CM

## 2022-02-21 MED ORDER — METHYLPREDNISOLONE ACETATE 40 MG/ML IJ SUSP
40.0000 mg | INTRAMUSCULAR | Status: AC | PRN
  Administered 2022-02-21: 40 mg via INTRA_ARTICULAR

## 2022-02-21 MED ORDER — LIDOCAINE HCL 1 % IJ SOLN
4.0000 mL | INTRAMUSCULAR | Status: AC | PRN
  Administered 2022-02-21: 4 mL

## 2022-02-21 NOTE — Progress Notes (Signed)
   Procedure Note  Patient: Ronnie Black             Date of Birth: [birth year]           MRN: [id number]             Visit Date: 02/21/2022  Procedures: Visit Diagnoses:  1. Pain in right hip    Large Joint Inj: R hip joint on 02/21/2022 9:44 AM Indications: pain Details: 22 G 3.5 in needle, ultrasound-guided anterior approach Medications: 4 mL lidocaine 1 %; 40 mg methylPREDNISolone acetate 40 MG/ML Outcome: tolerated well, no immediate complications  Procedure: US-guided intra-articular hip injection, right After discussion on risks/benefits/indications and informed verbal consent was obtained, a timeout was performed. Patient was lying supine on exam table. The hip was cleaned with betadine and alcohol swabs. Then utilizing ultrasound guidance, the patient's femoral head and neck junction was identified and subsequently injected with 4:1 lidocaine:depomedrol via an in-plane approach with ultrasound visualization of the injectate administered into the hip joint. Patient tolerated procedure well without immediate complications.  Procedure, treatment alternatives, risks and benefits explained, specific risks discussed. Consent was given by the patient. Immediately prior to procedure a time out was called to verify the correct patient, procedure, equipment, support staff and site/side marked as required. Patient was prepped and draped in the usual sterile fashion.     - I evaluated the patient about 10 minutes post-injection and he had very good improvement in pain and range of motion - follow-up with Dr. Ninfa Linden as indicated; I am happy to see them as needed - he does have a THA scheduled in March - per Dr. Ninfa Linden was recommending injection as long as it was this week (> 6 weeks from surgery)  Elba Barman, DO [place]  This note was dictated using Dragon naturally speaking software and may contain errors in syntax,  spelling, or content which have not been identified prior to signing this note.

## 2022-02-25 ENCOUNTER — Ambulatory Visit: Payer: Medicare PPO | Admitting: Sports Medicine

## 2022-02-25 ENCOUNTER — Ambulatory Visit (INDEPENDENT_AMBULATORY_CARE_PROVIDER_SITE_OTHER): Payer: Medicare PPO

## 2022-02-25 ENCOUNTER — Encounter: Payer: Self-pay | Admitting: Sports Medicine

## 2022-02-25 DIAGNOSIS — M25562 Pain in left knee: Secondary | ICD-10-CM

## 2022-02-25 DIAGNOSIS — M222X2 Patellofemoral disorders, left knee: Secondary | ICD-10-CM

## 2022-02-25 DIAGNOSIS — G8929 Other chronic pain: Secondary | ICD-10-CM

## 2022-02-25 NOTE — Progress Notes (Signed)
Ronnie Black - 77 y.o. male MRN [id number]  Date of birth: 1945-09-15  Office Visit Note: Visit Date: 02/25/2022 PCP: [name], MD Referred by: [name], MD  Subjective: Chief Complaint  Patient presents with   Left Knee - Pain   HPI: Ronnie Black is a pleasant 77 y.o. male who presents today for chronic left knee pain.  He is very active and fit.  Right hip - performed IA-hip injection 02/21/22 - reports he is doing ok from this injection. Less pain, but still some pain with walking. Has THA planned for March.  Left knee -currently he has minimal to no pain.  Presents today because he had previous knee pain that he would like to ensure does not return while he is [place] for his upcoming right hip replacement.  He states back in 2022 he had a history of a Baker's cyst that was treated conservatively.  He did end up wearing a knee compression sleeve and felt like the pain has been worse.  No significant effusion of the knee but some swelling and pain over the anterior aspect of the knee.  He did get considerable relief from a corticosteroid injection on January 18.  When he had his pain, it was worse with steps, specifically going up and down stairs.  He is currently not taking any medication for this.  He is holding from tennis, but would like to continue walking.  Pertinent ROS were reviewed with the patient and found to be negative unless otherwise specified above in HPI.   Assessment & Plan: Visit Diagnoses:  1. Chronic pain of left knee   2. Patellofemoral disorder of left knee    Plan: Discussed with Juanda Crumble the likely etiology of his knee pain.  He has good joint space both medially and laterally although does have arthritic changes noted in the patella femoral joint.  I believe this is where his initial pain had stemmed from, likely predisposing to some synovitis that calmed down with a corticosteroid injection.  In order to prevent this and to keep both knees  strong for his upcoming hip procedure, we did print out a customized handout for him, my athletic trainer, Lilia Pro, did review and demonstrate home rehab exercises for him to continue once daily.  He will hold on bracing/knee sleeve at this time.  He will follow-up with me as needed, if his pain flares back up we can always consider 1 additional corticosteroid injection or obtaining MRI of the knee to evaluate for any symptoms of chondromalacia or other intra-articular pathology.  Follow-up: Return if symptoms worsen or fail to improve.   Meds & Orders: No orders of the defined types were placed in this encounter.   Orders Placed This Encounter  Procedures   XR Knee Complete 4 Views Left     Procedures: No procedures performed      Clinical History: No specialty comments available.  He reports that he quit smoking about 46 years ago. His smoking use included cigarettes. He has a 7.50 pack-year smoking history. He has never used smokeless tobacco. No results for input(s): "HGBA1C", "LABURIC" in the last 8760 hours.  Objective:    Physical Exam  Gen: Well-appearing, in no acute distress; non-toxic CV: Well-perfused. Warm.  Resp: Breathing unlabored on room air; no wheezing. Psych: Fluid speech in conversation; appropriate affect; normal thought process Neuro: Sensation intact throughout. No gross coordination deficits.   Ortho Exam - Left knee: No specific TTP or joint  space TTP.  No erythema or ecchymosis.  There is crepitus with patellar flexion and extension.  Positive pain and click with Clark's patellar compression testing and patellar glide. Full range of motion from 0-135 degrees.  Strength 5/5 with knee flexion and extension.  No obvious Baker's cyst palpated.  Ligamentously intact, no varus or valgus instability, negative Lachman. NVI.  Imaging: XR Knee Complete 4 Views Left  Result Date: 02/25/2022 4 views of the left knee including standing AP, Rosenberg, lateral and sunrise  views were ordered and reviewed by myself.  X-rays demonstrate rather well-preserved medial and lateral joint space.  There is at least moderate arthritic change of the patellofemoral joint space with patella alta noted.  A small spur off the superior patella is noted.  No acute fracture noted.   Past Medical/Family/Surgical/Social History: Medications & Allergies reviewed per EMR, new medications updated. Patient Active Problem List   Diagnosis Date Noted   Unilateral primary osteoarthritis, right hip 02/07/2022   Aortic atherosclerosis ([place]) 05/22/2021   Malignant neoplasm of prostate ([place]) 05/08/2021   Bilateral primary osteoarthritis of hip 12/04/2020   Essential hypertension 12/01/2019   Current use of long term anticoagulation 11/06/2018   Atrial fibrillation status post cardioversion ([place]) 02/24/2017   Persistent atrial fibrillation ([place]) 11/21/2016   Benign prostatic hyperplasia without lower urinary tract symptoms 11/07/2016   Family history of colon cancer 10/01/2012   Herniated lumbar disc without myelopathy 07/08/2012   ED (erectile dysfunction) 01/07/2012   Actinic keratosis 01/07/2012   Seborrheic keratosis 01/07/2012   CAROTID BRUIT 08/24/2008   Mixed hyperlipidemia 08/13/2008   HOCM (hypertrophic obstructive cardiomyopathy) ([place]) 08/13/2008   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   Persistent atrial fibrillation (HCC)    s/p PVI 3/12   Prostate cancer ([place]) 05/08/2021   04/2021   Family History  Problem Relation Age of Onset   Coronary artery disease Other    Colon cancer Mother    Lung cancer Mother    Colon cancer Father    Early death Neg Hx    Hearing loss Neg Hx    Heart disease Neg Hx    Hyperlipidemia Neg  Hx    Hypertension Neg Hx    Kidney disease Neg Hx    Stroke Neg Hx    Past Surgical History:  Procedure Laterality Date   APPENDECTOMY  1970   atrial fibrillation ablation  03/27/2010   PVI with CTI ablation by [place] N/A 11/21/2016   Procedure: ATRIAL FIBRILLATION ABLATION;  Surgeon: [name], MD;  Location: [place] CV LAB;  Service: Cardiovascular;  Laterality: N/A;   CARDIOVERSION     CARDIOVERSION  04/26/2011   Procedure: CARDIOVERSION;  Surgeon: Larey Dresser, MD;  Location: [facility] ENDOSCOPY;  Service: Cardiovascular;  Laterality: N/A;   CARDIOVERSION N/A 04/28/2012   Procedure: CARDIOVERSION;  Surgeon: Thayer Headings, MD;  Location: [place];  Service: Cardiovascular;  Laterality: N/A;   CARDIOVERSION N/A 07/28/2012   Procedure: CARDIOVERSION;  Surgeon: Lelon Perla, MD;  Location: [facility] ENDOSCOPY;  Service: Cardiovascular;  Laterality: N/A;   CARDIOVERSION N/A 08/14/2012   Procedure: CARDIOVERSION;  Surgeon: Josue Hector, MD;  Location: [place];  Service:  Cardiovascular;  Laterality: N/A;   CARDIOVERSION N/A 08/11/2014   Procedure: CARDIOVERSION;  Surgeon: Fay Records, MD;  Location: [place];  Service: Cardiovascular;  Laterality: N/A;   CARDIOVERSION N/A 08/26/2014   Procedure: CARDIOVERSION;  Surgeon: Sanda Klein, MD;  Location: [place];  Service: Cardiovascular;  Laterality: N/A;   CARDIOVERSION N/A 10/10/2016   Procedure: CARDIOVERSION;  Surgeon: Larey Dresser, MD;  Location: [facility] ENDOSCOPY;  Service: Cardiovascular;  Laterality: N/A;   CARDIOVERSION N/A 12/04/2016   Procedure: CARDIOVERSION;  Surgeon: Larey Dresser, MD;  Location: [facility] ENDOSCOPY;  Service: Cardiovascular;  Laterality: N/A;   CARDIOVERSION N/A 06/09/2018   Procedure: CARDIOVERSION;  Surgeon: Dorothy Spark, MD;  Location: [facility] ENDOSCOPY;  Service: Cardiovascular;  Laterality: N/A;   CARDIOVERSION N/A 08/17/2019   Procedure: CARDIOVERSION;   Surgeon: [name], MD;  Location: [place];  Service: Cardiovascular;  Laterality: N/A;   CARDIOVERSION N/A 11/18/2019   Procedure: CARDIOVERSION;  Surgeon: [name], MD;  Location: [place];  Service: Cardiovascular;  Laterality: N/A;   CARDIOVERSION N/A 03/09/2020   Procedure: CARDIOVERSION;  Surgeon: [name], MD;  Location: [place];  Service: Cardiovascular;  Laterality: N/A;   RADIOACTIVE SEED IMPLANT N/A 08/06/2021   Procedure: RADIOACTIVE SEED IMPLANT/BRACHYTHERAPY IMPLANT;  Surgeon: [name], MD;  Location: [facility];  Service: Urology;  Laterality: N/A;   SKIN CANCER EXCISION     SPACE OAR INSTILLATION N/A 08/06/2021   Procedure: SPACE OAR INSTILLATION;  Surgeon: [name], MD;  Location: [facility];  Service: Urology;  Laterality: N/A;   TEE WITHOUT CARDIOVERSION  04/26/2011   Procedure: TRANSESOPHAGEAL ECHOCARDIOGRAM (TEE);  Surgeon: Larey Dresser, MD;  Location: [facility] ENDOSCOPY;  Service: Cardiovascular;  Laterality: N/A;  to be done at 1330   TEE WITHOUT CARDIOVERSION N/A 04/28/2012   Procedure: TRANSESOPHAGEAL ECHOCARDIOGRAM (TEE);  Surgeon: Thayer Headings, MD;  Location: [place];  Service: Cardiovascular;  Laterality: N/A;   WISDOM TOOTH EXTRACTION     yrs ago   Social History   Occupational History   Occupation: professor    Employer: [employer]    Comment: retired   Tobacco Use   Smoking status: Former    Packs/day: 0.50    Years: 15.00    Total pack years: 7.50    Types: Cigarettes    Quit date: 11/28/1975    Years since quitting: 46.2   Smokeless tobacco: Never  Vaping Use   Vaping Use: Never used  Substance and Sexual Activity   Alcohol use: Yes    Alcohol/week: 6.0 standard drinks of alcohol    Types: 3 Glasses of wine, 3 Cans of beer per week    Comment: social   Drug use: No   Sexual activity: Yes

## 2022-02-25 NOTE — Progress Notes (Signed)
Patient was instructed in 10 minutes of therapeutic exercises for left knee pain to improve strength, ROM and function according to my instructions and plan of care by a Certified Athletic Trainer during the office visit. A customized handout was provided and demonstration of proper technique shown and discussed. Patient did perform exercises and demonstrate understanding through teachback.  All questions discussed and answered.

## 2022-02-28 ENCOUNTER — Encounter (HOSPITAL_COMMUNITY): Payer: Self-pay | Admitting: *Deleted

## 2022-03-01 ENCOUNTER — Other Ambulatory Visit (HOSPITAL_BASED_OUTPATIENT_CLINIC_OR_DEPARTMENT_OTHER): Payer: Self-pay

## 2022-03-01 MED ORDER — FLUTICASONE PROPIONATE 50 MCG/ACT NA SUSP: 1.0000 | Freq: Every day | NASAL | 6 refills | Status: DC

## 2022-03-01 MED ORDER — TAMSULOSIN HCL 0.4 MG PO CAPS
0.4000 mg | ORAL_CAPSULE | Freq: Every day | ORAL | 3 refills | Status: DC
Start: 2021-10-31 — End: 2022-12-04
  Filled 2022-04-18 – 2022-05-22 (×3): qty 90, 90d supply, fill #0
  Filled 2022-08-16: qty 90, 90d supply, fill #1

## 2022-03-07 DIAGNOSIS — Z7901 Long term (current) use of anticoagulants: Secondary | ICD-10-CM | POA: Diagnosis not present

## 2022-03-07 DIAGNOSIS — Z85828 Personal history of other malignant neoplasm of skin: Secondary | ICD-10-CM | POA: Diagnosis not present

## 2022-03-07 DIAGNOSIS — L821 Other seborrheic keratosis: Secondary | ICD-10-CM | POA: Diagnosis not present

## 2022-03-07 DIAGNOSIS — L812 Freckles: Secondary | ICD-10-CM | POA: Diagnosis not present

## 2022-03-07 DIAGNOSIS — H61002 Unspecified perichondritis of left external ear: Secondary | ICD-10-CM | POA: Diagnosis not present

## 2022-03-07 DIAGNOSIS — R040 Epistaxis: Secondary | ICD-10-CM | POA: Diagnosis not present

## 2022-03-07 DIAGNOSIS — D1801 Hemangioma of skin and subcutaneous tissue: Secondary | ICD-10-CM | POA: Diagnosis not present

## 2022-03-07 DIAGNOSIS — L570 Actinic keratosis: Secondary | ICD-10-CM | POA: Diagnosis not present

## 2022-03-07 DIAGNOSIS — L820 Inflamed seborrheic keratosis: Secondary | ICD-10-CM | POA: Diagnosis not present

## 2022-03-11 ENCOUNTER — Other Ambulatory Visit (HOSPITAL_BASED_OUTPATIENT_CLINIC_OR_DEPARTMENT_OTHER): Payer: Self-pay

## 2022-03-11 MED FILL — Losartan Potassium Tab 25 MG: ORAL | 90 days supply | Qty: 90 | Fill #0 | Status: AC

## 2022-03-12 DIAGNOSIS — H35033 Hypertensive retinopathy, bilateral: Secondary | ICD-10-CM | POA: Diagnosis not present

## 2022-03-12 DIAGNOSIS — H43813 Vitreous degeneration, bilateral: Secondary | ICD-10-CM | POA: Diagnosis not present

## 2022-03-12 DIAGNOSIS — H40013 Open angle with borderline findings, low risk, bilateral: Secondary | ICD-10-CM | POA: Diagnosis not present

## 2022-03-12 DIAGNOSIS — H2513 Age-related nuclear cataract, bilateral: Secondary | ICD-10-CM | POA: Diagnosis not present

## 2022-03-19 ENCOUNTER — Other Ambulatory Visit: Payer: Self-pay | Admitting: Physician Assistant

## 2022-03-19 DIAGNOSIS — Z01818 Encounter for other preprocedural examination: Secondary | ICD-10-CM

## 2022-03-28 ENCOUNTER — Encounter: Payer: Self-pay | Admitting: Radiology

## 2022-04-01 ENCOUNTER — Ambulatory Visit (HOSPITAL_COMMUNITY)
Admission: RE | Admit: 2022-04-01 | Discharge: 2022-04-01 | Disposition: A | Discharge status: Home / Self Care | Payer: Medicare PPO | Source: Ambulatory Visit | Attending: Physician Assistant | Admitting: Physician Assistant

## 2022-04-01 VITALS — BP 132/68 | HR 77 | Ht 66.0 in | Wt 155.4 lb

## 2022-04-01 DIAGNOSIS — Z79899 Other long term (current) drug therapy: Secondary | ICD-10-CM | POA: Diagnosis not present

## 2022-04-01 DIAGNOSIS — I517 Cardiomegaly: Secondary | ICD-10-CM | POA: Insufficient documentation

## 2022-04-01 DIAGNOSIS — I4819 Other persistent atrial fibrillation: Secondary | ICD-10-CM | POA: Insufficient documentation

## 2022-04-01 DIAGNOSIS — D6869 Other thrombophilia: Secondary | ICD-10-CM

## 2022-04-01 DIAGNOSIS — Z7901 Long term (current) use of anticoagulants: Secondary | ICD-10-CM | POA: Diagnosis not present

## 2022-04-01 DIAGNOSIS — I4892 Unspecified atrial flutter: Secondary | ICD-10-CM | POA: Insufficient documentation

## 2022-04-01 DIAGNOSIS — I119 Hypertensive heart disease without heart failure: Secondary | ICD-10-CM | POA: Insufficient documentation

## 2022-04-01 LAB — BASIC METABOLIC PANEL
Anion gap: 16 — ABNORMAL HIGH (ref 5–15)
BUN: 11 mg/dL (ref 8–23)
CO2: 21 mmol/L — ABNORMAL LOW (ref 22–32)
Calcium: 9.4 mg/dL (ref 8.9–10.3)
Chloride: 101 mmol/L (ref 98–111)
Creatinine, Ser: 0.87 mg/dL (ref 0.61–1.24)
GFR, Estimated: >60 mL/min (ref >60)
Glucose, Bld: 95 mg/dL (ref 70–99)
Potassium: 4.2 mmol/L (ref 3.5–5.1)
Sodium: 138 mmol/L (ref 135–145)

## 2022-04-01 LAB — MAGNESIUM: Magnesium: 2.2 mg/dL (ref 1.7–2.4)

## 2022-04-01 NOTE — Progress Notes (Signed)
Patient ID: Ronnie Black, male   DOB: Jan 06, 1946, 77 y.o.   MRN: [id number]     Primary Care Physician: [name], MD Referring Physician: Dr. Ron Parker EP: Dr. Carmelia Bake is a 77 y.o. male with a h/o afib, frequent cardioversions in  the past. He has had afib/flutter ablation in 2012  and afib ablation fall of 2018. He was on norpace at one time, changed to amiodarone which was stopped shortly after last ablation, then back to Norpace. Unfortunately, he continued to have symptomatic breakthrough episodes of afib and the decision was made to stop Norpace and load on dofetilide.   On follow up today, patient presents for dofetilide admission. He states he stopped Norpace 5 days ago and has not missed any doses of anticoagulation in the last 3 weeks. He is in SR today.    F/u in afib clinic, 06/01/20. He is one week s/p tikosyn load. He did well maintaining SR in the hospital and  was d/c on 500 mcg dofetilide  bid with stable qtc. He feels well and has been staying  in rhythm.   F/u in afib clinic, 02/13/21. He is staying in rhythm. Unfortunately, he lost his wife suddenly in July 2022. He is exercising regulary. He is compliant with his drugs.   F/u in afib clinic, 05/13/21. He is here for Tikosyn surveillance. He has established with Dr. Quentin Ore as of March of this year with  Dr. [name] leaving the practice.  He is doing well staying in [place]. Compliant with Tikosyn.   F/u in afib clinic, 8/29 for Tikosyn surveillance. He has not noted any afib. He does note elevated BP's at home 140-150 range which have slowly creeped up and is concerned he may need BP med. His ekg showed some bigeminy PVC's but on ausculation he sounded regular. Qt stable. Repeat ECG reassuring no longer showing PVCs.  On follow up today for Tikosyn surveillance, he is doing well overall. He has not noted any episodes of Afib. He is scheduled for right hip replacement on 3/22 and has been instructed to stop  Eliquis on 3/19. He has noted sporadic episodes of rapid regular heart rate (HR 100s) about once every 2 months. These episodes will occur randomly and typically resolve after a few hours. Intermittent nosebleeds from October - December but this resolved on its own (he was followed by ENT). He has been compliant with Tikosyn and Eliquis.   Today, he denies symptoms of  palpitations, chest pain, shortness of breath, orthopnea, PND, lower extremity edema, dizziness, presyncope, syncope, or neurologic sequela. The patient is tolerating medications without difficulties and is otherwise without complaint today.   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   Persistent atrial fibrillation ([place])    s/p PVI 3/12   Prostate cancer ([place]) 05/08/2021   04/2021   Past Surgical History:  Procedure Laterality Date   APPENDECTOMY  1970   atrial fibrillation ablation  03/27/2010   PVI with CTI ablation by [place] N/A 11/21/2016   Procedure: [place];  Surgeon: [name], MD;  Location: [place] CV LAB;  Service: Cardiovascular;  Laterality: N/A;   CARDIOVERSION  CARDIOVERSION  04/26/2011   Procedure: CARDIOVERSION;  Surgeon: Larey Dresser, MD;  Location: [place];  Service: Cardiovascular;  Laterality: N/A;   CARDIOVERSION N/A 04/28/2012   Procedure: CARDIOVERSION;  Surgeon: Thayer Headings, MD;  Location: [place];  Service: Cardiovascular;  Laterality: N/A;   CARDIOVERSION N/A 07/28/2012   Procedure: CARDIOVERSION;  Surgeon: Lelon Perla, MD;  Location: [facility] ENDOSCOPY;  Service: Cardiovascular;  Laterality: N/A;   CARDIOVERSION N/A 08/14/2012   Procedure: CARDIOVERSION;  Surgeon: Josue Hector,  MD;  Location: [place];  Service: Cardiovascular;  Laterality: N/A;   CARDIOVERSION N/A 08/11/2014   Procedure: CARDIOVERSION;  Surgeon: Fay Records, MD;  Location: [place];  Service: Cardiovascular;  Laterality: N/A;   CARDIOVERSION N/A 08/26/2014   Procedure: CARDIOVERSION;  Surgeon: Sanda Klein, MD;  Location: [facility] ENDOSCOPY;  Service: Cardiovascular;  Laterality: N/A;   CARDIOVERSION N/A 10/10/2016   Procedure: CARDIOVERSION;  Surgeon: Larey Dresser, MD;  Location: [facility] ENDOSCOPY;  Service: Cardiovascular;  Laterality: N/A;   CARDIOVERSION N/A 12/04/2016   Procedure: CARDIOVERSION;  Surgeon: Larey Dresser, MD;  Location: [facility] ENDOSCOPY;  Service: Cardiovascular;  Laterality: N/A;   CARDIOVERSION N/A 06/09/2018   Procedure: CARDIOVERSION;  Surgeon: Dorothy Spark, MD;  Location: [facility] ENDOSCOPY;  Service: Cardiovascular;  Laterality: N/A;   CARDIOVERSION N/A 08/17/2019   Procedure: CARDIOVERSION;  Surgeon: [name], MD;  Location: [place];  Service: Cardiovascular;  Laterality: N/A;   CARDIOVERSION N/A 11/18/2019   Procedure: CARDIOVERSION;  Surgeon: [name], MD;  Location: [place];  Service: Cardiovascular;  Laterality: N/A;   CARDIOVERSION N/A 03/09/2020   Procedure: CARDIOVERSION;  Surgeon: [name], MD;  Location: [place];  Service: Cardiovascular;  Laterality: N/A;   RADIOACTIVE SEED IMPLANT N/A 08/06/2021   Procedure: RADIOACTIVE SEED IMPLANT/BRACHYTHERAPY IMPLANT;  Surgeon: [name], MD;  Location: [facility];  Service: Urology;  Laterality: N/A;   SKIN CANCER EXCISION     SPACE OAR INSTILLATION N/A 08/06/2021   Procedure: SPACE OAR INSTILLATION;  Surgeon: [name], MD;  Location: [facility];  Service: Urology;  Laterality: N/A;   TEE WITHOUT CARDIOVERSION  04/26/2011   Procedure: TRANSESOPHAGEAL ECHOCARDIOGRAM (TEE);  Surgeon: Larey Dresser, MD;  Location: [facility] ENDOSCOPY;   Service: Cardiovascular;  Laterality: N/A;  to be done at 1330   TEE WITHOUT CARDIOVERSION N/A 04/28/2012   Procedure: TRANSESOPHAGEAL ECHOCARDIOGRAM (TEE);  Surgeon: Thayer Headings, MD;  Location: [facility] ENDOSCOPY;  Service: Cardiovascular;  Laterality: N/A;   WISDOM TOOTH EXTRACTION     yrs ago    Current Outpatient Medications  Medication Sig Dispense Refill   atorvastatin (LIPITOR) 10 MG tablet TAKE 1 TABLET BY MOUTH EVERY DAY 90 tablet 2   dofetilide (TIKOSYN) 500 MCG capsule TAKE 1 CAPSULE BY MOUTH TWICE A DAY 180 capsule 3   ELIQUIS 5 MG TABS tablet TAKE 1 TABLET BY MOUTH TWICE A DAY 180 tablet 1   fluticasone (CUTIVATE) 0.005 % ointment Apply 1 Application topically as needed.     ibuprofen (ADVIL) 200 MG tablet Take 400 mg by mouth daily as needed for mild pain.     losartan (COZAAR) 25 MG tablet TAKE 1 TABLET (25 MG TOTAL) BY MOUTH DAILY. 90 tablet 2   melatonin 5 MG TABS Take 5 mg by mouth at bedtime.     metoprolol tartrate (LOPRESSOR) 25 MG tablet TAKE 1 TABLET BY MOUTH TWICE A DAY 180 tablet 2   Multiple Vitamin (MULTIVITAMIN WITH MINERALS)  TABS tablet Take 1 tablet by mouth 3 (three) times a week.     sodium chloride (OCEAN) 0.65 % SOLN nasal spray Place 1 spray into both nostrils as needed (dryness).     tamsulosin (FLOMAX) 0.4 MG CAPS capsule Take 1 capsule (0.4 mg total) by mouth daily. 90 capsule 3   No current facility-administered medications for this encounter.    No Known Allergies  Social History   Socioeconomic History   Marital status: Widowed    Spouse name: Not on file   Number of children: 2   Years of education: Not on file   Highest education level: Not on file  Occupational History   Occupation: professor    Employer: [employer]    Comment: retired   Tobacco Use   Smoking status: Former    Packs/day: 0.50    Years: 15.00    Total pack years: 7.50    Types: Cigarettes    Quit date: 11/28/1975    Years since quitting: 46.3   Smokeless  tobacco: Never  Vaping Use   Vaping Use: Never used  Substance and Sexual Activity   Alcohol use: Yes    Alcohol/week: 6.0 standard drinks of alcohol    Types: 3 Glasses of wine, 3 Cans of beer per week    Comment: social   Drug use: No   Sexual activity: Yes  Other Topics Concern   Not on file  Social History Narrative    a professor of Therapist, occupational at [employer] in [place] @ [place] Strain: [place]  (06/09/2020)   Overall Financial Resource Strain (CARDIA)    Difficulty of Paying Living Expenses: Not hard at all  Food Insecurity: No Food Insecurity (06/09/2020)   Hunger Vital Sign    Worried About Running Out of Food in the Last Year: Never true    [place] in the Last Year: Never true  Transportation Needs: No Transportation Needs (06/09/2020)   PRAPARE - Hydrologist (Medical): No    Lack of Transportation (Non-Medical): No  Physical Activity: Sufficiently Active (06/09/2020)   Exercise Vital Sign    Days of Exercise per Week: 6 days    Minutes of Exercise per Session: 60 min  Stress: No Stress Concern Present (06/09/2020)   [place]    Feeling of Stress : Not at all  Social Connections: Moderately Isolated (06/09/2020)   Social Connection and Isolation Panel [NHANES]    Frequency of Communication with Friends and Family: Twice a week    Frequency of Social Gatherings with Friends and Family: Three times a week    Attends Religious Services: Never    Active Member of Clubs or Organizations: No    Attends Archivist Meetings: Never    Marital Status: Married  Human resources officer Violence: Not At Risk (06/09/2020)   Humiliation, Afraid, Rape, and Kick questionnaire    Fear of Current or Ex-Partner: No    Emotionally Abused: No    Physically Abused: No    Sexually Abused: No     Family History  Problem Relation Age of Onset   Coronary artery disease Other    Colon cancer Mother    Lung cancer Mother    Colon cancer Father    Early death Neg Hx    Hearing loss Neg Hx  Heart disease Neg Hx    Hyperlipidemia Neg Hx    Hypertension Neg Hx    Kidney disease Neg Hx    Stroke Neg Hx     ROS- All systems are reviewed and negative except as per the HPI above  Physical Exam: Vitals:   04/01/22 1359  BP: 132/68  Pulse: 77  Weight: 70.5 kg  Height: '5\' 6"'$  (1.676 m)    GEN- The patient is a well appearing elderly male, alert and oriented x 3 today.   HEENT-head normocephalic, atraumatic, sclera clear, conjunctiva pink, hearing intact, trachea midline. Lungs- Clear to ausculation bilaterally, normal work of breathing Heart- Regular rate and rhythm, no murmurs, rubs or gallops  GI- soft, NT, ND, + BS Extremities- no clubbing, cyanosis, or edema MS- no significant deformity or atrophy Skin- no rash or lesion Psych- euthymic mood, full affect Neuro- strength and sensation are intact  Ekg- Sinus rhythm  T wave abnormality, consider inferolateral ischemia Abnormal ECG Vent. rate 77 BPM PR interval 186 ms QRS duration 94 ms QT/QTcB 400/453 ms   Echo 11/23/19 1. The apical views are concerning for apical LV hypertrophy. . Left  ventricular ejection fraction, by estimation, is 60 to 65%. The left  ventricle has normal function. The left ventricle has no regional wall  motion abnormalities. There is severe apical   left ventricular hypertrophy. Left ventricular diastolic parameters are  indeterminate.   2. Right ventricular systolic function is normal. The right ventricular  size is normal.   3. The mitral valve is normal in structure. Mild mitral valve  regurgitation. No evidence of mitral stenosis.   4. The aortic valve is normal in structure. Aortic valve regurgitation is  not visualized. No aortic stenosis is present.    Assessment and  Plan:  1. Persistent atrial fibrillation/atrial flutter  S/p ablation 03/2010 and 11/21/16 Patient  had  dofetilide loading hospitalization May 2022  Maintaining  SR, QT stable   Continue tikosyn 500 mcg bid  Continue Eliquis 5 mg BID for chadsvasc score of 3  Continue metoprolol tartrate  25 mg bid    Bmet/mag today   2. HCM Nonobstructive   Last echo 2021 - may benefit from follow up with general cardiology.   3. HTN Stable today, continue current regimen  F/u with Dr. Quentin Ore in 6 months, Afib clinic in 1 year.   Adline Peals, PA-C [facility] [address] [place],  [address] [phone]

## 2022-04-02 NOTE — Progress Notes (Signed)
COVID Vaccine Completed: yes  Date of COVID positive in last 90 days:  PCP - Billey Chang, MD Cardiologist - Lars Mage, MD  Chest x-ray -  EKG - 04/01/22 Epic Stress Test -  ECHO - 2021 Cardiac Cath - 2012 Pacemaker/ICD device last checked: Spinal Cord Stimulator:  Bowel Prep -   Sleep Study -  CPAP -   Fasting Blood Sugar -  Checks Blood Sugar _____ times a day  Last dose of GLP1 agonist-  N/A GLP1 instructions:  N/A   Last dose of SGLT-2 inhibitors-  N/A SGLT-2 instructions: N/A   Blood Thinner Instructions: Eliquis, stop 3-19 Aspirin Instructions: Last Dose:  Activity level:  Can go up a flight of stairs and perform activities of daily living without stopping and without symptoms of chest pain or shortness of breath.  Able to exercise without symptoms  Unable to go up a flight of stairs without symptoms of     Anesthesia review: persistant a fib, HTN, cardiomyopathy, aortic atherosclerosis, cards clearance?  Patient denies shortness of breath, fever, cough and chest pain at PAT appointment  Patient verbalized understanding of instructions that were given to them at the PAT appointment. Patient was also instructed that they will need to review over the PAT instructions again at home before surgery.

## 2022-04-03 ENCOUNTER — Encounter (HOSPITAL_COMMUNITY)
Admission: RE | Admit: 2022-04-03 | Discharge: 2022-04-03 | Disposition: A | Discharge status: Home / Self Care | Payer: Medicare PPO | Source: Ambulatory Visit | Attending: Orthopaedic Surgery | Admitting: Orthopaedic Surgery

## 2022-04-03 ENCOUNTER — Other Ambulatory Visit: Payer: Self-pay

## 2022-04-03 ENCOUNTER — Encounter (HOSPITAL_COMMUNITY): Payer: Self-pay

## 2022-04-03 VITALS — BP 124/85 | HR 76 | Temp 97.9°F | Resp 12 | Ht 66.0 in | Wt 153.0 lb

## 2022-04-03 DIAGNOSIS — Z01812 Encounter for preprocedural laboratory examination: Secondary | ICD-10-CM | POA: Insufficient documentation

## 2022-04-03 DIAGNOSIS — I4891 Unspecified atrial fibrillation: Secondary | ICD-10-CM | POA: Insufficient documentation

## 2022-04-03 DIAGNOSIS — Z7901 Long term (current) use of anticoagulants: Secondary | ICD-10-CM | POA: Diagnosis not present

## 2022-04-03 DIAGNOSIS — M1611 Unilateral primary osteoarthritis, right hip: Secondary | ICD-10-CM | POA: Insufficient documentation

## 2022-04-03 DIAGNOSIS — Z01818 Encounter for other preprocedural examination: Secondary | ICD-10-CM

## 2022-04-03 HISTORY — DX: Pneumonia, unspecified organism: J18.9

## 2022-04-03 LAB — CBC
HCT: 44.6 % (ref 39.0–52.0)
Hemoglobin: 15 g/dL (ref 13.0–17.0)
MCH: 29.6 pg (ref 26.0–34.0)
MCHC: 33.6 g/dL (ref 30.0–36.0)
MCV: 88.1 fL (ref 80.0–100.0)
Platelets: 196 10*3/uL (ref 150–400)
RBC: 5.06 MIL/uL (ref 4.22–5.81)
RDW: 12.5 % (ref 11.5–15.5)
WBC: 5.4 10*3/uL (ref 4.0–10.5)
nRBC: 0 % (ref 0.0–0.2)

## 2022-04-03 LAB — COMPREHENSIVE METABOLIC PANEL
ALT: 25 U/L (ref 0–44)
AST: 24 U/L (ref 15–41)
Albumin: 4.3 g/dL (ref 3.5–5.0)
Alkaline Phosphatase: 51 U/L (ref 38–126)
Anion gap: 11 (ref 5–15)
BUN: 17 mg/dL (ref 8–23)
CO2: 25 mmol/L (ref 22–32)
Calcium: 8.9 mg/dL (ref 8.9–10.3)
Chloride: 100 mmol/L (ref 98–111)
Creatinine, Ser: 0.88 mg/dL (ref 0.61–1.24)
GFR, Estimated: >60 mL/min (ref >60)
Glucose, Bld: 90 mg/dL (ref 70–99)
Potassium: 3.5 mmol/L (ref 3.5–5.1)
Sodium: 136 mmol/L (ref 135–145)
Total Bilirubin: 0.7 mg/dL (ref 0.3–1.2)
Total Protein: 6.3 g/dL — ABNORMAL LOW (ref 6.5–8.1)

## 2022-04-03 LAB — SURGICAL PCR SCREEN
MRSA, PCR: NEGATIVE
Staphylococcus aureus: NEGATIVE

## 2022-04-03 NOTE — Patient Instructions (Addendum)
SURGICAL WAITING ROOM VISITATION  Patients having surgery or a procedure may have no more than 2 support people in the waiting area - these visitors may rotate.    Children under the age of 26 must have an adult with them who is not the patient.  Due to an increase in RSV and influenza rates and associated hospitalizations, children ages 26 and under may not visit patients in [place].  If the patient needs to stay at the hospital during part of their recovery, the visitor guidelines for inpatient rooms apply. Pre-op nurse will coordinate an appropriate time for 1 support person to accompany patient in pre-op.  This support person may not rotate.    Please refer to the [facility] website for the visitor guidelines for Inpatients (after your surgery is over and you are in a regular room).    Your procedure is scheduled on: 04/12/22   Report to [facility] Main Entrance    Report to admitting at 6:15 AM   Call this number if you have problems the morning of surgery [phone]   Do not eat food :After Midnight.   After Midnight you may have the following liquids until 5:45 AM DAY OF SURGERY  Water Non-Citrus Juices (without pulp, NO RED-Apple, White grape, White cranberry) Black Coffee (NO MILK/CREAM OR CREAMERS, sugar ok)  Clear Tea (NO MILK/CREAM OR CREAMERS, sugar ok) regular and decaf                             Plain Jell-O (NO RED)                                           Fruit ices (not with fruit pulp, NO RED)                                     Popsicles (NO RED)                                                               Sports drinks like Gatorade (NO RED)                 The day of surgery:  Drink ONE (1) Pre-Surgery Clear Ensure at 5:45 AM the morning of surgery. Drink in one sitting. Do not sip.  This drink was given to you during your hospital  pre-op appointment visit. Nothing else to drink after completing the  Pre-Surgery Clear  Ensure.          If you have questions, please contact your surgeon's office.   FOLLOW BOWEL PREP AND ANY ADDITIONAL PRE OP INSTRUCTIONS YOU RECEIVED FROM YOUR SURGEON'S OFFICE!!!     Oral Hygiene is also important to reduce your risk of infection.                                    Remember - BRUSH YOUR TEETH THE MORNING OF SURGERY WITH YOUR REGULAR TOOTHPASTE  DENTURES WILL  BE REMOVED PRIOR TO SURGERY PLEASE DO NOT APPLY "Poly grip" OR ADHESIVES!!!   Take these medicines the morning of surgery with A SIP OF WATER: Atorvastatin, Tikosyn, Metoprolol, Tamsulosin   These are anesthesia recommendations for holding your anticoagulants.  Please contact your prescribing physician to confirm IF it is safe to hold your anticoagulants for this length of time.   Eliquis Apixaban   72 hours   Xarelto Rivaroxaban   72 hours  Plavix Clopidogrel   120 hours  Pletal Cilostazol   120 hours                                You may not have any metal on your body including jewelry, and body piercing             Do not wear lotions, powders, cologne, or deodorant  Do not shave  48 hours prior to surgery.               Men may shave face and neck.   Do not bring valuables to the hospital. [place].   Contacts, glasses, dentures or bridgework may not be worn into surgery.   Bring small overnight bag day of surgery.   DO NOT [place]. PHARMACY WILL DISPENSE MEDICATIONS LISTED ON YOUR MEDICATION LIST TO YOU DURING YOUR ADMISSION [place]!   Special Instructions: Bring a copy of your healthcare power of attorney and living will documents the day of surgery if you haven't scanned them before.              Please read over the following fact sheets you were given: IF [place] [phone] Schneiders    If you received a COVID test during your pre-op visit   it is requested that you wear a mask when out in public, stay away from anyone that may not be feeling well and notify your surgeon if you develop symptoms. If you test positive for Covid or have been in contact with anyone that has tested positive in the last 10 days please notify you surgeon.    [place] - Preparing for Surgery Before surgery, you can play an important role.  Because skin is not sterile, your skin needs to be as free of germs as possible.  You can reduce the number of germs on your skin by washing with CHG (chlorahexidine gluconate) soap before surgery.  CHG is an antiseptic cleaner which kills germs and bonds with the skin to continue killing germs even after washing. Please DO NOT use if you have an allergy to CHG or antibacterial soaps.  If your skin becomes reddened/irritated stop using the CHG and inform your nurse when you arrive at Short Stay. Do not shave (including legs and underarms) for at least 48 hours prior to the first CHG shower.  You may shave your face/neck.  Please follow these instructions carefully:  1.  Shower with CHG Soap the night before surgery and the  morning of surgery.  2.  If you choose to wash your hair, wash your hair first as usual with your normal  shampoo.  3.  After you shampoo, rinse your hair and body thoroughly to remove the shampoo.  4.  Use CHG as you would any other liquid soap.  You can apply chg directly to the skin and wash.  Gently with a scrungie or clean washcloth.  5.  Apply the CHG Soap to your body ONLY FROM THE NECK DOWN.   Do   not use on face/ open                           Wound or open sores. Avoid contact with eyes, ears mouth and   genitals (private parts).                       Wash face,  Genitals (private parts) with your normal soap.             6.  Wash thoroughly, paying special attention to the area where your    surgery  will be performed.  7.  Thoroughly rinse your body with warm water  from the neck down.  8.  DO NOT shower/wash with your normal soap after using and rinsing off the CHG Soap.                9.  Pat yourself dry with a clean towel.            10.  Wear clean pajamas.            11.  Place clean sheets on your bed the night of your first shower and do not  sleep with pets. Day of Surgery : Do not apply any lotions/deodorants the morning of surgery.  Please wear clean clothes to the hospital/surgery center.  FAILURE TO FOLLOW THESE INSTRUCTIONS MAY RESULT IN THE CANCELLATION OF YOUR SURGERY  PATIENT SIGNATURE_________________________________  NURSE SIGNATURE__________________________________  ________________________________________________________________________  Ronnie Black  An incentive spirometer is a tool that can help keep your lungs clear and active. This tool measures how well you are filling your lungs with each breath. Taking long deep breaths may help reverse or decrease the chance of developing breathing (pulmonary) problems (especially infection) following: A long period of time when you are unable to move or be active. BEFORE THE PROCEDURE  If the spirometer includes an indicator to show your best effort, your nurse or respiratory therapist will set it to a desired goal. If possible, sit up straight or lean slightly forward. Try not to slouch. Hold the incentive spirometer in an upright position. INSTRUCTIONS FOR USE  Sit on the edge of your bed if possible, or sit up as far as you can in bed or on a chair. Hold the incentive spirometer in an upright position. Breathe out normally. Place the mouthpiece in your mouth and seal your lips tightly around it. Breathe in slowly and as deeply as possible, raising the piston or the ball toward the top of the column. Hold your breath for 3-5 seconds or for as long as possible. Allow the piston or ball to fall to the bottom of the column. Remove the mouthpiece from your mouth and breathe out  normally. Rest for a few seconds and repeat Steps 1 through 7 at least 10 times every 1-2 hours when you are awake. Take your time and take a few normal breaths between deep breaths. The spirometer may include an indicator to show your best effort. Use the indicator as a goal to work toward during each repetition. After each set of 10 deep breaths, practice coughing to be sure your  lungs are clear. If you have an incision (the cut made at the time of surgery), support your incision when coughing by placing a pillow or rolled up towels firmly against it. Once you are able to get out of bed, walk around indoors and cough well. You may stop using the incentive spirometer when instructed by your caregiver.  RISKS AND COMPLICATIONS Take your time so you do not get dizzy or light-headed. If you are in pain, you may need to take or ask for pain medication before doing incentive spirometry. It is harder to take a deep breath if you are having pain. AFTER USE Rest and breathe slowly and easily. It can be helpful to keep track of a log of your progress. Your caregiver can provide you with a simple table to help with this. If you are using the spirometer at home, follow these instructions: [place] IF:  You are having difficultly using the spirometer. You have trouble using the spirometer as often as instructed. Your pain medication is not giving enough relief while using the spirometer. You develop fever of 100.5 F (38.1 C) or higher. SEEK IMMEDIATE MEDICAL CARE IF:  You cough up bloody sputum that had not been present before. You develop fever of 102 F (38.9 C) or greater. You develop worsening pain at or near the incision site. MAKE SURE YOU:  Understand these instructions. Will watch your condition. Will get help right away if you are not doing well or get worse. Document Released: 05/20/2006 Document Revised: 04/01/2011 Document Reviewed: 07/21/2006 ExitCare Patient Information  2014 ExitCare, [place].   ________________________________________________________________________ WHAT IS A BLOOD TRANSFUSION? Blood Transfusion Information  A transfusion is the replacement of blood or some of its parts. Blood is made up of multiple cells which provide different functions. Red blood cells carry oxygen and are used for blood loss replacement. White blood cells fight against infection. Platelets control bleeding. Plasma helps clot blood. Other blood products are available for specialized needs, such as hemophilia or other clotting disorders. BEFORE THE TRANSFUSION  Who gives blood for transfusions?  Healthy volunteers who are fully evaluated to make sure their blood is safe. This is blood bank blood. Transfusion therapy is the safest it has ever been in the practice of medicine. Before blood is taken from a donor, a complete history is taken to make sure that person has no history of diseases nor engages in risky social behavior (examples are intravenous drug use or sexual activity with multiple partners). The donor's travel history is screened to minimize risk of transmitting infections, such as malaria. The donated blood is tested for signs of infectious diseases, such as HIV and hepatitis. The blood is then tested to be sure it is compatible with you in order to minimize the chance of a transfusion reaction. If you or a relative donates blood, this is often done in anticipation of surgery and is not appropriate for emergency situations. It takes many days to process the donated blood. RISKS AND COMPLICATIONS Although transfusion therapy is very safe and saves many lives, the main dangers of transfusion include:  Getting an infectious disease. Developing a transfusion reaction. This is an allergic reaction to something in the blood you were given. Every precaution is taken to prevent this. The decision to have a blood transfusion has been considered carefully by your caregiver  before blood is given. Blood is not given unless the benefits outweigh the risks. AFTER THE TRANSFUSION Right after receiving a blood transfusion, you  will usually feel much better and more energetic. This is especially true if your red blood cells have gotten low (anemic). The transfusion raises the level of the red blood cells which carry oxygen, and this usually causes an energy increase. The nurse administering the transfusion will monitor you carefully for complications. HOME CARE INSTRUCTIONS  No special instructions are needed after a transfusion. You may find your energy is better. Speak with your caregiver about any limitations on activity for underlying diseases you may have. SEEK MEDICAL CARE IF:  Your condition is not improving after your transfusion. You develop redness or irritation at the intravenous (IV) site. SEEK IMMEDIATE MEDICAL CARE IF:  Any of the following symptoms occur over the next 12 hours: Shaking chills. You have a temperature by mouth above 102 F (38.9 C), not controlled by medicine. Chest, back, or muscle pain. People around you feel you are not acting correctly or are confused. Shortness of breath or difficulty breathing. Dizziness and fainting. You get a rash or develop hives. You have a decrease in urine output. Your urine turns a dark color or changes to pink, red, or brown. Any of the following symptoms occur over the next 10 days: You have a temperature by mouth above 102 F (38.9 C), not controlled by medicine. Shortness of breath. Weakness after normal activity. The white part of the eye turns yellow (jaundice). You have a decrease in the amount of urine or are urinating less often. Your urine turns a dark color or changes to pink, red, or brown. Document Released: 01/05/2000 Document Revised: 04/01/2011 Document Reviewed: 08/24/2007 [facility] Patient Information 2014 [place],  [place].  _______________________________________________________________________

## 2022-04-04 ENCOUNTER — Other Ambulatory Visit (HOSPITAL_BASED_OUTPATIENT_CLINIC_OR_DEPARTMENT_OTHER): Payer: Self-pay

## 2022-04-04 MED ORDER — COMIRNATY 30 MCG/0.3ML IM SUSY
PREFILLED_SYRINGE | INTRAMUSCULAR | 0 refills | Status: DC
  Filled 2022-04-04: qty 0.3, 1d supply, fill #0

## 2022-04-05 NOTE — Anesthesia Preprocedure Evaluation (Addendum)
Anesthesia Evaluation  Patient identified by MRN, date of birth, ID band Patient awake    Reviewed: Allergy & Precautions, H&P , NPO status , Patient's Chart, lab work & pertinent test results  Airway Mallampati: II   Neck ROM: full    Dental   Pulmonary former smoker   breath sounds clear to auscultation       Cardiovascular hypertension, + dysrhythmias Atrial Fibrillation  Rhythm:regular Rate:Normal     Neuro/Psych    GI/Hepatic   Endo/Other    Renal/GU      Musculoskeletal  (+) Arthritis ,    Abdominal   Peds  Hematology   Anesthesia Other Findings   Reproductive/Obstetrics                             Anesthesia Physical Anesthesia Plan  ASA: 3  Anesthesia Plan: Spinal and MAC   Post-op Pain Management:    Induction: Intravenous  PONV Risk Score and Plan: 1 and Propofol infusion, Ondansetron and Treatment may vary due to age or medical condition  Airway Management Planned: Nasal Cannula  Additional Equipment:   Intra-op Plan:   Post-operative Plan:   Informed Consent: I have reviewed the patients History and Physical, chart, labs and discussed the procedure including the risks, benefits and alternatives for the proposed anesthesia with the patient or authorized representative who has indicated his/her understanding and acceptance.     Dental advisory given  Plan Discussed with: CRNA, Anesthesiologist and Surgeon  Anesthesia Plan Comments: (See APP note by Durel Salts, FNP )       Anesthesia Quick Evaluation

## 2022-04-05 NOTE — Progress Notes (Signed)
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 Last office visit 04/01/22 with Malka So, PA  LABS: Labs reviewed: Acceptable for surgery. (all labs ordered are listed, but only abnormal results are displayed)  Labs Reviewed  COMPREHENSIVE METABOLIC PANEL - Abnormal; Notable for the following components:      Result Value   Total Protein 6.3 (*)    All other components within normal limits  SURGICAL PCR SCREEN  CBC  TYPE AND SCREEN    EKG 04/01/22:  Normal sinus rhythm T wave abnormality, consider inferior ischemia T wave abnormality, consider anterolateral ischemia When compared with ECG of 27-Sep-2021 13:54, No significant change was found  CV: Echo 11/23/19:  1. The apical views are concerning for apical LV hypertrophy. Left ventricular ejection fraction, by estimation, is 60 to 65%. The left ventricle has normal function. The left ventricle has no regional wall motion abnormalities. 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 Left ventricular diastolic parameters are indeterminate.   2. Right ventricular systolic function is normal. The right ventricular size is normal.   3. The mitral valve is normal in structure. Mild mitral valve regurgitation. No evidence of mitral stenosis.   4. The aortic valve  is normal in structure. Aortic valve regurgitation is not visualized. No aortic stenosis is present.   CT cardiac morphology 11/15/16:  1. There is normal pulmonary vein drainage into the left atrium. 2. The left atrial appendage is large with chicken wing morphology and two lobes. Ostial size 35 x 19 mm and length 42 mm. There is no thrombus in the left atrial appendage. 3. The esophagus runs to the right from the left atrial midline and is in the proximity to the right lower pulmonary vein. - Coronary Arteries: Normal coronary origin. Right dominance. The study was insufficient for plaque evaluation.   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   Persistent atrial fibrillation ([place])    s/p PVI 3/12   Pneumonia    Prostate cancer ([place]) 05/08/2021   04/2021    Past Surgical History:  Procedure Laterality Date   APPENDECTOMY  1970   atrial fibrillation ablation  03/27/2010   PVI with CTI ablation by [place] N/A 11/21/2016   Procedure: [place];  Surgeon: [name], MD;  Location: [place] [facility];  Service: Cardiovascular;  Laterality: N/A;   CARDIOVERSION     CARDIOVERSION  04/26/2011  Procedure: CARDIOVERSION;  Surgeon: Larey Dresser, MD;  Location: [place];  Service: Cardiovascular;  Laterality: N/A;   CARDIOVERSION N/A 04/28/2012   Procedure: CARDIOVERSION;  Surgeon: Thayer Headings, MD;  Location: [place];  Service: Cardiovascular;  Laterality: N/A;   CARDIOVERSION N/A 07/28/2012   Procedure: CARDIOVERSION;  Surgeon: Lelon Perla, MD;  Location: [place];  Service: Cardiovascular;  Laterality: N/A;   CARDIOVERSION N/A 08/14/2012   Procedure: CARDIOVERSION;  Surgeon:  Josue Hector, MD;  Location: [place];  Service: Cardiovascular;  Laterality: N/A;   CARDIOVERSION N/A 08/11/2014   Procedure: CARDIOVERSION;  Surgeon: Fay Records, MD;  Location: [place];  Service: Cardiovascular;  Laterality: N/A;   CARDIOVERSION N/A 08/26/2014   Procedure: CARDIOVERSION;  Surgeon: Sanda Klein, MD;  Location[facility] ENDOSCOPY;  Service: Cardiovascular;  Laterality: N/A;   CARDIOVERSION N/A 10/10/2016   Procedure: CARDIOVERSION;  Surgeon: Larey Dresser, MD;  Location: [facility] ENDOSCOPY;  Service: Cardiovascular;  Laterality: N/A;   CARDIOVERSION N/A 12/04/2016   Procedure: CARDIOVERSION;  Surgeon: Larey Dresser, MD;  Location: [facility] ENDOSCOPY;  Service: Cardiovascular;  Laterality: N/A;   CARDIOVERSION N/A 06/09/2018   Procedure: CARDIOVERSION;  Surgeon: Dorothy Spark, MD;  Location: [facility] ENDOSCOPY;  Service: Cardiovascular;  Laterality: N/A;   CARDIOVERSION N/A 08/17/2019   Procedure: CARDIOVERSION;  Surgeon: [name], MD;  Location: [place];  Service: Cardiovascular;  Laterality: N/A;   CARDIOVERSION N/A 11/18/2019   Procedure: CARDIOVERSION;  Surgeon: [name], MD;  Location: [place];  Service: Cardiovascular;  Laterality: N/A;   CARDIOVERSION N/A 03/09/2020   Procedure: CARDIOVERSION;  Surgeon: [name], MD;  Location: [place];  Service: Cardiovascular;  Laterality: N/A;   RADIOACTIVE SEED IMPLANT N/A 08/06/2021   Procedure: RADIOACTIVE SEED IMPLANT/BRACHYTHERAPY IMPLANT;  Surgeon: [name], MD;  [facility];  Service: Urology;  Laterality: N/A;   SKIN CANCER EXCISION     SPACE OAR INSTILLATION N/A 08/06/2021   Procedure: SPACE OAR INSTILLATION;  Surgeon: [name], MD;  Location[facility];  Service: Urology;  Laterality: N/A;   TEE WITHOUT CARDIOVERSION  04/26/2011   Procedure: TRANSESOPHAGEAL ECHOCARDIOGRAM (TEE);  Surgeon: Larey Dresser, MD;  Location: [facility]  ENDOSCOPY;  Service: Cardiovascular;  Laterality: N/A;  to be done at 1330   TEE WITHOUT CARDIOVERSION N/A 04/28/2012   Procedure: TRANSESOPHAGEAL ECHOCARDIOGRAM (TEE);  Surgeon: Thayer Headings, MD;  Location: [facility] ENDOSCOPY;  Service: Cardiovascular;  Laterality: N/A;   WISDOM TOOTH EXTRACTION     yrs ago    MEDICATIONS:  atorvastatin (LIPITOR) 10 MG tablet   COVID-19 mRNA vaccine 2023-2024 (COMIRNATY) syringe   dofetilide (TIKOSYN) 500 MCG capsule   ELIQUIS 5 MG TABS tablet   fluticasone (CUTIVATE) 0.005 % ointment   ibuprofen (ADVIL) 200 MG tablet   losartan (COZAAR) 25 MG tablet   melatonin 5 MG TABS   metoprolol tartrate (LOPRESSOR) 25 MG tablet   Multiple Vitamin (MULTIVITAMIN WITH MINERALS) TABS tablet   sodium chloride (OCEAN) 0.65 % SOLN nasal spray   tamsulosin (FLOMAX) 0.4 MG CAPS capsule   No current facility-administered medications for this encounter.    If no changes, I anticipate pt can proceed with surgery as scheduled.   Willeen Cass, PhD, FNP-BC [facility] Short Stay Surgical Center/Anesthesiology Phone: [phone] 04/05/2022 12:19 PM

## 2022-04-11 ENCOUNTER — Telehealth: Payer: Self-pay | Admitting: *Deleted

## 2022-04-11 NOTE — Care Plan (Signed)
[facility] RNCM call to patient to discuss his upcoming Right Total hip arthroplasty with Dr. Ninfa Linden. He is a [facility] patient and is agreeable to case management. He lives at [facility] and will be going to their SNF for STR after discharge. He does not have a walker, but I explained he would use hospital DME and then [facility] would provide as well for his rehab. No DME needed prior to discharge. Reviewed post op care instructions. Will continue to follow for needs.

## 2022-04-11 NOTE — H&P (Signed)
TOTAL HIP ADMISSION H&P  Patient is admitted for right total hip arthroplasty.  Subjective:  Chief Complaint: right hip pain  HPI: Ronnie Black, 77 y.o. male, has a history of pain and functional disability in the right hip(s) due to arthritis and patient has failed non-surgical conservative treatments for greater than 12 weeks to include flexibility and strengthening excercises and activity modification.  Onset of symptoms was gradual starting 2 years ago with gradually worsening course since that time.The patient noted no past surgery on the right hip(s).  Patient currently rates pain in the right hip at 9 out of 10 with activity. Patient has night pain, worsening of pain with activity and weight bearing, pain that interfers with activities of daily living, and pain with passive range of motion. Patient has evidence of subchondral sclerosis, periarticular osteophytes, and joint space narrowing by imaging studies. This condition presents safety issues increasing the risk of falls.  There is no current active infection.  Patient Active Problem List   Diagnosis Date Noted   Hypercoagulable state due to persistent atrial fibrillation ([place]) 04/01/2022   Unilateral primary osteoarthritis, right hip 02/07/2022   Aortic atherosclerosis ([place]) 05/22/2021   Malignant neoplasm of prostate ([place]) 05/08/2021   Bilateral primary osteoarthritis of hip 12/04/2020   Essential hypertension 12/01/2019   Current use of long term anticoagulation 11/06/2018   Atrial fibrillation status post cardioversion ([place]) 02/24/2017   Persistent atrial fibrillation ([place]) 11/21/2016   Benign prostatic hyperplasia without lower urinary tract symptoms 11/07/2016   Family history of colon cancer 10/01/2012   Herniated lumbar disc without myelopathy 07/08/2012   ED (erectile dysfunction) 01/07/2012   Actinic keratosis 01/07/2012   Seborrheic keratosis 01/07/2012   CAROTID BRUIT 08/24/2008   Mixed hyperlipidemia 08/13/2008    HOCM (hypertrophic obstructive cardiomyopathy) ([place]) 08/13/2008   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   Persistent atrial fibrillation ([place])    s/p PVI 3/12   Pneumonia    Prostate cancer ([place]) 05/08/2021   04/2021    Past Surgical History:  Procedure Laterality Date   APPENDECTOMY  1970   atrial fibrillation ablation  03/27/2010   PVI with CTI ablation by [place] N/A 11/21/2016   Procedure: ATRIAL FIBRILLATION ABLATION;  Surgeon: [name], MD;  Location: [place] [facility];  Service: Cardiovascular;  Laterality: N/A;   CARDIOVERSION     CARDIOVERSION  04/26/2011   Procedure: CARDIOVERSION;  Surgeon: Larey Dresser, MD;  Location: [place];  Service: Cardiovascular;  Laterality: N/A;   CARDIOVERSION N/A 04/28/2012   Procedure: CARDIOVERSION;  Surgeon: Thayer Headings, MD;  Location: [place];  Service: Cardiovascular;  Laterality: N/A;   CARDIOVERSION N/A 07/28/2012   Procedure: CARDIOVERSION;  Surgeon: Lelon Perla, MD;  Location: [facility] ENDOSCOPY;  Service: Cardiovascular;  Laterality: N/A;   CARDIOVERSION N/A 08/14/2012   Procedure: CARDIOVERSION;  Surgeon: Josue Hector, MD;  Location: [facility] ENDOSCOPY;  Service: Cardiovascular;  Laterality: N/A;   CARDIOVERSION N/A 08/11/2014   Procedure: CARDIOVERSION;  Surgeon: Fay Records, MD;  Location: [place];  Service: Cardiovascular;  Laterality: N/A;   CARDIOVERSION N/A 08/26/2014   Procedure: CARDIOVERSION;  Surgeon: Sanda Klein, MD;  Location: [facility] ENDOSCOPY;  Service: Cardiovascular;  Laterality: N/A;   CARDIOVERSION N/A 10/10/2016   Procedure: CARDIOVERSION;  Surgeon: Larey Dresser, MD;  Location: [facility] ENDOSCOPY;  Service: Cardiovascular;   Laterality: N/A;   CARDIOVERSION N/A 12/04/2016   Procedure: CARDIOVERSION;  Surgeon: Larey Dresser, MD;  Location: [facility] ENDOSCOPY;  Service: Cardiovascular;  Laterality: N/A;   CARDIOVERSION N/A 06/09/2018   Procedure: CARDIOVERSION;  Surgeon: Dorothy Spark, MD;  Location: [facility] ENDOSCOPY;  Service: Cardiovascular;  Laterality: N/A;   CARDIOVERSION N/A 08/17/2019   Procedure: CARDIOVERSION;  Surgeon: [name], MD;  Location: [place];  Service: Cardiovascular;  Laterality: N/A;   CARDIOVERSION N/A 11/18/2019   Procedure: CARDIOVERSION;  Surgeon: [name], MD;  Location: [place];  Service: Cardiovascular;  Laterality: N/A;   CARDIOVERSION N/A 03/09/2020   Procedure: CARDIOVERSION;  Surgeon: [name], MD;  Location: [place];  Service: Cardiovascular;  Laterality: N/A;   RADIOACTIVE SEED IMPLANT N/A 08/06/2021   Procedure: RADIOACTIVE SEED IMPLANT/BRACHYTHERAPY IMPLANT;  Surgeon: [name], MD;  Location: [facility];  Service: Urology;  Laterality: N/A;   SKIN CANCER EXCISION     SPACE OAR INSTILLATION N/A 08/06/2021   Procedure: SPACE OAR INSTILLATION;  Surgeon: [name], MD;  Location: [facility];  Service: Urology;  Laterality: N/A;   TEE WITHOUT CARDIOVERSION  04/26/2011   Procedure: TRANSESOPHAGEAL ECHOCARDIOGRAM (TEE);  Surgeon: Larey Dresser, MD;  Location: [facility] ENDOSCOPY;  Service: Cardiovascular;  Laterality: N/A;  to be done at 1330   TEE WITHOUT CARDIOVERSION N/A 04/28/2012   Procedure: TRANSESOPHAGEAL ECHOCARDIOGRAM (TEE);  Surgeon: Thayer Headings, MD;  Location: [place];  Service: Cardiovascular;  Laterality: N/A;   WISDOM TOOTH EXTRACTION     yrs ago    No current facility-administered medications for this encounter.   Current Outpatient Medications  Medication Sig Dispense Refill Last Dose   atorvastatin (LIPITOR) 10 MG tablet TAKE 1 TABLET BY MOUTH EVERY DAY 90 tablet 2     dofetilide (TIKOSYN) 500 MCG capsule TAKE 1 CAPSULE BY MOUTH TWICE A DAY 180 capsule 3    ELIQUIS 5 MG TABS tablet TAKE 1 TABLET BY MOUTH TWICE A DAY 180 tablet 1    fluticasone (CUTIVATE) 0.005 % ointment Apply 1 Application topically as needed.      ibuprofen (ADVIL) 200 MG tablet Take 400 mg by mouth daily as needed for mild pain.      losartan (COZAAR) 25 MG tablet TAKE 1 TABLET (25 MG TOTAL) BY MOUTH DAILY. 90 tablet 2    melatonin 5 MG TABS Take 5 mg by mouth at bedtime.      metoprolol tartrate (LOPRESSOR) 25 MG tablet TAKE 1 TABLET BY MOUTH TWICE A DAY 180 tablet 2    Multiple Vitamin (MULTIVITAMIN WITH MINERALS) TABS tablet Take 1 tablet by mouth 4 (four) times a week.      sodium chloride (OCEAN) 0.65 % SOLN nasal spray Place 1 spray into both nostrils as needed (dryness).      tamsulosin (FLOMAX) 0.4 MG CAPS capsule Take 1 capsule (0.4 mg total) by mouth daily. 90 capsule 3    COVID-19 mRNA vaccine 2023-2024 (COMIRNATY) syringe Inject into the muscle. 0.3 mL 0    No Known Allergies  Social History   Tobacco Use   Smoking status: Former    Packs/day: 0.50    Years: 15.00    Additional pack years: 0.00    Total pack years: 7.50    Types: Cigarettes  Quit date: 11/28/1975    Years since quitting: 46.4   Smokeless tobacco: Never  Substance Use Topics   Alcohol use: Yes    Alcohol/week: 6.0 standard drinks of alcohol    Types: 3 Glasses of wine, 3 Cans of beer per week    Comment: social    Family History  Problem Relation Age of Onset   Coronary artery disease Other    Colon cancer Mother    Lung cancer Mother    Colon cancer Father    Early death Neg Hx    Hearing loss Neg Hx    Heart disease Neg Hx    Hyperlipidemia Neg Hx    Hypertension Neg Hx    Kidney disease Neg Hx    Stroke Neg Hx      Review of Systems  Objective:  Physical Exam Vitals reviewed.  Constitutional:      Appearance: Normal appearance. He is normal weight.  HENT:     Head:  Normocephalic and atraumatic.  Eyes:     Extraocular Movements: Extraocular movements intact.     Pupils: Pupils are equal, round, and reactive to light.  Cardiovascular:     Rate and Rhythm: Normal rate. Rhythm irregular.  Pulmonary:     Effort: Pulmonary effort is normal.     Breath sounds: Normal breath sounds.  Abdominal:     Palpations: Abdomen is soft.  Musculoskeletal:     Cervical back: Normal range of motion and neck supple.     Right hip: Tenderness and bony tenderness present. Decreased range of motion. Decreased strength.  Neurological:     Mental Status: He is alert and oriented to person, place, and time.  Psychiatric:        Behavior: Behavior normal.     Vital signs in last 24 hours:    Labs:   Estimated body mass index is 24.69 kg/m as calculated from the following:   Height as of 04/03/22: 5\' 6"  (1.676 m).   Weight as of 04/03/22: 69.4 kg.   Imaging Review Plain radiographs demonstrate severe degenerative joint disease of the right hip(s). The bone quality appears to be good for age and reported activity level.      Assessment/Plan:  End stage arthritis, right hip(s)  The patient history, physical examination, clinical judgement of the provider and imaging studies are consistent with end stage degenerative joint disease of the right hip(s) and total hip arthroplasty is deemed medically necessary. The treatment options including medical management, injection therapy, arthroscopy and arthroplasty were discussed at length. The risks and benefits of total hip arthroplasty were presented and reviewed. The risks due to aseptic loosening, infection, stiffness, dislocation/subluxation,  thromboembolic complications and other imponderables were discussed.  The patient acknowledged the explanation, agreed to proceed with the plan and consent was signed. Patient is being admitted for inpatient treatment for surgery, pain control, PT, OT, prophylactic antibiotics, VTE  prophylaxis, progressive ambulation and ADL's and discharge planning.The patient is planning to be discharged home with home health services

## 2022-04-11 NOTE — Telephone Encounter (Signed)
Ortho bundle pre-op call completed. 

## 2022-04-12 ENCOUNTER — Ambulatory Visit (HOSPITAL_BASED_OUTPATIENT_CLINIC_OR_DEPARTMENT_OTHER): Payer: Medicare PPO | Admitting: Certified Registered Nurse Anesthetist

## 2022-04-12 ENCOUNTER — Encounter (HOSPITAL_COMMUNITY)
Admission: RE | Disposition: A | Discharge status: Skilled Nursing Facility | Payer: Self-pay | Source: Ambulatory Visit | Attending: Orthopaedic Surgery

## 2022-04-12 ENCOUNTER — Observation Stay (HOSPITAL_COMMUNITY)
Admission: RE | Admit: 2022-04-12 | Discharge: 2022-04-13 | Disposition: A | Discharge status: Skilled Nursing Facility | Payer: Medicare PPO | Source: Ambulatory Visit | Attending: Orthopaedic Surgery | Admitting: Orthopaedic Surgery

## 2022-04-12 ENCOUNTER — Encounter (HOSPITAL_COMMUNITY): Payer: Self-pay | Admitting: Orthopaedic Surgery

## 2022-04-12 ENCOUNTER — Observation Stay (HOSPITAL_COMMUNITY): Payer: Medicare PPO

## 2022-04-12 ENCOUNTER — Ambulatory Visit (HOSPITAL_COMMUNITY): Payer: Medicare PPO

## 2022-04-12 ENCOUNTER — Other Ambulatory Visit: Payer: Self-pay

## 2022-04-12 ENCOUNTER — Ambulatory Visit (HOSPITAL_COMMUNITY): Payer: Medicare PPO | Admitting: Physician Assistant

## 2022-04-12 DIAGNOSIS — I4819 Other persistent atrial fibrillation: Secondary | ICD-10-CM | POA: Insufficient documentation

## 2022-04-12 DIAGNOSIS — I10 Essential (primary) hypertension: Secondary | ICD-10-CM | POA: Diagnosis not present

## 2022-04-12 DIAGNOSIS — Z8546 Personal history of malignant neoplasm of prostate: Secondary | ICD-10-CM | POA: Diagnosis not present

## 2022-04-12 DIAGNOSIS — I4891 Unspecified atrial fibrillation: Secondary | ICD-10-CM

## 2022-04-12 DIAGNOSIS — Z471 Aftercare following joint replacement surgery: Secondary | ICD-10-CM | POA: Diagnosis not present

## 2022-04-12 DIAGNOSIS — M1611 Unilateral primary osteoarthritis, right hip: Secondary | ICD-10-CM | POA: Diagnosis not present

## 2022-04-12 DIAGNOSIS — Z87891 Personal history of nicotine dependence: Secondary | ICD-10-CM | POA: Insufficient documentation

## 2022-04-12 DIAGNOSIS — Z96641 Presence of right artificial hip joint: Secondary | ICD-10-CM

## 2022-04-12 DIAGNOSIS — Z79899 Other long term (current) drug therapy: Secondary | ICD-10-CM | POA: Insufficient documentation

## 2022-04-12 DIAGNOSIS — Z7901 Long term (current) use of anticoagulants: Secondary | ICD-10-CM | POA: Insufficient documentation

## 2022-04-12 HISTORY — PX: TOTAL HIP ARTHROPLASTY: SHX124

## 2022-04-12 LAB — ABO/RH: ABO/RH(D): O NEG

## 2022-04-12 LAB — TYPE AND SCREEN
ABO/RH(D): O NEG
Antibody Screen: NEGATIVE

## 2022-04-12 SURGERY — ARTHROPLASTY, HIP, TOTAL, ANTERIOR APPROACH
Anesthesia: Monitor Anesthesia Care | Site: Hip | Laterality: Right

## 2022-04-12 MED ORDER — METHOCARBAMOL 500 MG PO TABS
500.0000 mg | ORAL_TABLET | Freq: Four times a day (QID) | ORAL | Status: DC | PRN
  Administered 2022-04-13: 500 mg via ORAL
  Filled 2022-04-12: qty 1

## 2022-04-12 MED ORDER — ONDANSETRON HCL 4 MG PO TABS: 4.0000 mg | ORAL_TABLET | Freq: Four times a day (QID) | ORAL | Status: DC | PRN

## 2022-04-12 MED ORDER — METOPROLOL TARTRATE 25 MG PO TABS
25.0000 mg | ORAL_TABLET | Freq: Two times a day (BID) | ORAL | Status: DC
  Administered 2022-04-12 – 2022-04-13 (×2): 25 mg via ORAL
  Filled 2022-04-12 (×2): qty 1

## 2022-04-12 MED ORDER — EPHEDRINE SULFATE-NACL 50-0.9 MG/10ML-% IV SOSY
PREFILLED_SYRINGE | INTRAVENOUS | Status: DC | PRN
  Administered 2022-04-12 (×2): 7.5 mg via INTRAVENOUS

## 2022-04-12 MED ORDER — ORAL CARE MOUTH RINSE: 15.0000 mL | Freq: Once | OROMUCOSAL | Status: AC

## 2022-04-12 MED ORDER — DEXAMETHASONE SODIUM PHOSPHATE 10 MG/ML IJ SOLN
INTRAMUSCULAR | Status: DC | PRN
  Administered 2022-04-12: 5 mg via INTRAVENOUS

## 2022-04-12 MED ORDER — METOCLOPRAMIDE HCL 5 MG PO TABS: 5.0000 mg | ORAL_TABLET | Freq: Three times a day (TID) | ORAL | Status: DC | PRN

## 2022-04-12 MED ORDER — MELATONIN 5 MG PO TABS
5.0000 mg | ORAL_TABLET | Freq: Every day | ORAL | Status: DC
  Administered 2022-04-12: 5 mg via ORAL
  Filled 2022-04-12: qty 1

## 2022-04-12 MED ORDER — ONDANSETRON HCL 4 MG/2ML IJ SOLN: 4.0000 mg | Freq: Four times a day (QID) | INTRAMUSCULAR | Status: DC | PRN

## 2022-04-12 MED ORDER — MIDAZOLAM HCL 2 MG/2ML IJ SOLN
INTRAMUSCULAR | Status: AC
  Filled 2022-04-12: qty 2

## 2022-04-12 MED ORDER — OXYCODONE HCL 5 MG PO TABS: 5.0000 mg | ORAL_TABLET | Freq: Once | ORAL | Status: DC | PRN

## 2022-04-12 MED ORDER — CEFAZOLIN SODIUM-DEXTROSE 2-4 GM/100ML-% IV SOLN
2.0000 g | INTRAVENOUS | Status: AC
  Administered 2022-04-12: 2 g via INTRAVENOUS
  Filled 2022-04-12: qty 100

## 2022-04-12 MED ORDER — PROPOFOL 10 MG/ML IV BOLUS
INTRAVENOUS | Status: DC | PRN
  Administered 2022-04-12: 20 mg via INTRAVENOUS
  Administered 2022-04-12: 30 mg via INTRAVENOUS

## 2022-04-12 MED ORDER — ONDANSETRON HCL 4 MG/2ML IJ SOLN
INTRAMUSCULAR | Status: AC
  Filled 2022-04-12: qty 2

## 2022-04-12 MED ORDER — LOSARTAN POTASSIUM 25 MG PO TABS
25.0000 mg | ORAL_TABLET | Freq: Every day | ORAL | Status: DC
  Administered 2022-04-13: 25 mg via ORAL
  Filled 2022-04-12: qty 1

## 2022-04-12 MED ORDER — EPHEDRINE 5 MG/ML INJ
INTRAVENOUS | Status: AC
  Filled 2022-04-12: qty 5

## 2022-04-12 MED ORDER — TAMSULOSIN HCL 0.4 MG PO CAPS
0.4000 mg | ORAL_CAPSULE | Freq: Every day | ORAL | Status: DC
  Administered 2022-04-13: 0.4 mg via ORAL
  Filled 2022-04-12: qty 1

## 2022-04-12 MED ORDER — FENTANYL CITRATE (PF) 100 MCG/2ML IJ SOLN
INTRAMUSCULAR | Status: DC | PRN
  Administered 2022-04-12: 25 ug via INTRAVENOUS

## 2022-04-12 MED ORDER — BUPIVACAINE IN DEXTROSE 0.75-8.25 % IT SOLN
INTRATHECAL | Status: DC | PRN
  Administered 2022-04-12: 1.6 mL via INTRATHECAL

## 2022-04-12 MED ORDER — MENTHOL 3 MG MT LOZG: 1.0000 | LOZENGE | OROMUCOSAL | Status: DC | PRN

## 2022-04-12 MED ORDER — PHENOL 1.4 % MT LIQD: 1.0000 | OROMUCOSAL | Status: DC | PRN

## 2022-04-12 MED ORDER — TRANEXAMIC ACID-NACL 1000-0.7 MG/100ML-% IV SOLN
1000.0000 mg | INTRAVENOUS | Status: AC
  Administered 2022-04-12: 1000 mg via INTRAVENOUS
  Filled 2022-04-12: qty 100

## 2022-04-12 MED ORDER — SODIUM CHLORIDE 0.9 % IV SOLN: INTRAVENOUS | Status: DC

## 2022-04-12 MED ORDER — FENTANYL CITRATE (PF) 100 MCG/2ML IJ SOLN
INTRAMUSCULAR | Status: AC
  Filled 2022-04-12: qty 2

## 2022-04-12 MED ORDER — CEFAZOLIN SODIUM-DEXTROSE 1-4 GM/50ML-% IV SOLN
1.0000 g | Freq: Four times a day (QID) | INTRAVENOUS | Status: AC
  Administered 2022-04-12 (×2): 1 g via INTRAVENOUS
  Filled 2022-04-12 (×2): qty 50

## 2022-04-12 MED ORDER — POVIDONE-IODINE 10 % EX SWAB: 2.0000 | Freq: Once | CUTANEOUS | Status: DC

## 2022-04-12 MED ORDER — DEXAMETHASONE SODIUM PHOSPHATE 10 MG/ML IJ SOLN
INTRAMUSCULAR | Status: AC
  Filled 2022-04-12: qty 1

## 2022-04-12 MED ORDER — ALUM & MAG HYDROXIDE-SIMETH 200-200-20 MG/5ML PO SUSP: 30.0000 mL | ORAL | Status: DC | PRN

## 2022-04-12 MED ORDER — FENTANYL CITRATE PF 50 MCG/ML IJ SOSY: 25.0000 ug | PREFILLED_SYRINGE | INTRAMUSCULAR | Status: DC | PRN

## 2022-04-12 MED ORDER — HYDROCODONE-ACETAMINOPHEN 5-325 MG PO TABS
1.0000 | ORAL_TABLET | ORAL | Status: DC | PRN
  Administered 2022-04-12 – 2022-04-13 (×5): 2 via ORAL
  Filled 2022-04-12 (×2): qty 2
  Filled 2022-04-12: qty 1
  Filled 2022-04-12 (×3): qty 2

## 2022-04-12 MED ORDER — METOCLOPRAMIDE HCL 5 MG/ML IJ SOLN: 5.0000 mg | Freq: Three times a day (TID) | INTRAMUSCULAR | Status: DC | PRN

## 2022-04-12 MED ORDER — SODIUM CHLORIDE 0.9 % IR SOLN
Status: DC | PRN
  Administered 2022-04-12: 1000 mL

## 2022-04-12 MED ORDER — 0.9 % SODIUM CHLORIDE (POUR BTL) OPTIME
TOPICAL | Status: DC | PRN
  Administered 2022-04-12: 1000 mL

## 2022-04-12 MED ORDER — PROPOFOL 1000 MG/100ML IV EMUL
INTRAVENOUS | Status: AC
  Filled 2022-04-12: qty 100

## 2022-04-12 MED ORDER — CHLORHEXIDINE GLUCONATE 0.12 % MT SOLN
15.0000 mL | Freq: Once | OROMUCOSAL | Status: AC
  Administered 2022-04-12: 15 mL via OROMUCOSAL

## 2022-04-12 MED ORDER — METHOCARBAMOL 1000 MG/10ML IJ SOLN: 500.0000 mg | Freq: Four times a day (QID) | INTRAVENOUS | Status: DC | PRN

## 2022-04-12 MED ORDER — MORPHINE SULFATE (PF) 2 MG/ML IV SOLN: 0.5000 mg | INTRAVENOUS | Status: DC | PRN

## 2022-04-12 MED ORDER — OXYCODONE HCL 5 MG/5ML PO SOLN: 5.0000 mg | Freq: Once | ORAL | Status: DC | PRN

## 2022-04-12 MED ORDER — DOFETILIDE 250 MCG PO CAPS
500.0000 ug | ORAL_CAPSULE | Freq: Two times a day (BID) | ORAL | Status: DC
  Administered 2022-04-12 – 2022-04-13 (×2): 500 ug via ORAL
  Filled 2022-04-12 (×2): qty 2

## 2022-04-12 MED ORDER — DOCUSATE SODIUM 100 MG PO CAPS
100.0000 mg | ORAL_CAPSULE | Freq: Two times a day (BID) | ORAL | Status: DC
  Administered 2022-04-12 – 2022-04-13 (×2): 100 mg via ORAL
  Filled 2022-04-12 (×2): qty 1

## 2022-04-12 MED ORDER — PHENYLEPHRINE HCL-NACL 20-0.9 MG/250ML-% IV SOLN
INTRAVENOUS | Status: DC | PRN
  Administered 2022-04-12: 30 ug/min via INTRAVENOUS

## 2022-04-12 MED ORDER — PROPOFOL 500 MG/50ML IV EMUL
INTRAVENOUS | Status: DC | PRN
  Administered 2022-04-12: 100 ug/kg/min via INTRAVENOUS

## 2022-04-12 MED ORDER — ATORVASTATIN CALCIUM 10 MG PO TABS
10.0000 mg | ORAL_TABLET | Freq: Every day | ORAL | Status: DC
  Administered 2022-04-13: 10 mg via ORAL
  Filled 2022-04-12: qty 1

## 2022-04-12 MED ORDER — DIPHENHYDRAMINE HCL 12.5 MG/5ML PO ELIX: 12.5000 mg | ORAL_SOLUTION | ORAL | Status: DC | PRN

## 2022-04-12 MED ORDER — LACTATED RINGERS IV SOLN: INTRAVENOUS | Status: DC

## 2022-04-12 MED ORDER — ACETAMINOPHEN 325 MG PO TABS: 325.0000 mg | ORAL_TABLET | Freq: Four times a day (QID) | ORAL | Status: DC | PRN

## 2022-04-12 MED ORDER — ONDANSETRON HCL 4 MG/2ML IJ SOLN
INTRAMUSCULAR | Status: DC | PRN
  Administered 2022-04-12: 4 mg via INTRAVENOUS

## 2022-04-12 MED ORDER — APIXABAN 5 MG PO TABS
5.0000 mg | ORAL_TABLET | Freq: Two times a day (BID) | ORAL | Status: DC
  Administered 2022-04-13: 5 mg via ORAL
  Filled 2022-04-12: qty 1

## 2022-04-12 MED ORDER — MIDAZOLAM HCL 2 MG/2ML IJ SOLN
INTRAMUSCULAR | Status: DC | PRN
  Administered 2022-04-12: 2 mg via INTRAVENOUS

## 2022-04-12 MED ORDER — HYDROCODONE-ACETAMINOPHEN 7.5-325 MG PO TABS: 1.0000 | ORAL_TABLET | ORAL | Status: DC | PRN

## 2022-04-12 SURGICAL SUPPLY — 45 items
APL SKNCLS STERI-STRIP NONHPOA (GAUZE/BANDAGES/DRESSINGS)
ARTICULEZE HEAD (Hips) ×1 IMPLANT
BAG COUNTER SPONGE SURGICOUNT (BAG) ×2 IMPLANT
BAG SPEC THK2 15X12 ZIP CLS (MISCELLANEOUS) ×1
BAG SPNG CNTER NS LX DISP (BAG) ×1
BAG ZIPLOCK 12X15 (MISCELLANEOUS) IMPLANT
BENZOIN TINCTURE PRP APPL 2/3 (GAUZE/BANDAGES/DRESSINGS) IMPLANT
BLADE SAW SGTL 18X1.27X75 (BLADE) ×2 IMPLANT
COVER PERINEAL POST (MISCELLANEOUS) ×2 IMPLANT
COVER SURGICAL LIGHT HANDLE (MISCELLANEOUS) ×2 IMPLANT
CUP SECTOR GRIPTON 58MM (Orthopedic Implant) IMPLANT
DRAPE FOOT SWITCH (DRAPES) ×2 IMPLANT
DRAPE STERI IOBAN 125X83 (DRAPES) ×2 IMPLANT
DRAPE U-SHAPE 47X51 STRL (DRAPES) ×4 IMPLANT
DRSG AQUACEL AG ADV 3.5X10 (GAUZE/BANDAGES/DRESSINGS) ×2 IMPLANT
DURAPREP 26ML APPLICATOR (WOUND CARE) ×2 IMPLANT
ELECT REM PT RETURN 15FT ADLT (MISCELLANEOUS) ×2 IMPLANT
FEM STEM 12/14 TAPER SZ 4 HIP (Orthopedic Implant) ×1 IMPLANT
FEMORAL STEM 12/14 TPR SZ4 HIP (Orthopedic Implant) IMPLANT
GAUZE XEROFORM 1X8 LF (GAUZE/BANDAGES/DRESSINGS) ×2 IMPLANT
GLOVE BIO SURGEON STRL SZ7.5 (GLOVE) ×2 IMPLANT
GLOVE BIOGEL PI IND STRL 8 (GLOVE) ×4 IMPLANT
GLOVE ECLIPSE 8.0 STRL XLNG CF (GLOVE) ×2 IMPLANT
GOWN STRL REUS W/ TWL XL LVL3 (GOWN DISPOSABLE) ×4 IMPLANT
GOWN STRL REUS W/TWL XL LVL3 (GOWN DISPOSABLE) ×2
HANDPIECE INTERPULSE COAX TIP (DISPOSABLE) ×1
HEAD ARTICULEZE (Hips) IMPLANT
HEAD M SROM 36MM PLUS 1.5 (Hips) IMPLANT
HOLDER FOLEY CATH W/STRAP (MISCELLANEOUS) ×2 IMPLANT
KIT TURNOVER KIT A (KITS) IMPLANT
LINER NEUTRAL 36X58 PLUS4 IMPLANT
PACK ANTERIOR HIP CUSTOM (KITS) ×2 IMPLANT
SET HNDPC FAN SPRY TIP SCT (DISPOSABLE) ×2 IMPLANT
SROM M HEAD 36MM PLUS 1.5 (Hips) ×1 IMPLANT
STAPLER VISISTAT 35W (STAPLE) IMPLANT
STRIP CLOSURE SKIN 1/2X4 (GAUZE/BANDAGES/DRESSINGS) IMPLANT
SUT ETHIBOND NAB CT1 #1 30IN (SUTURE) ×2 IMPLANT
SUT ETHILON 2 0 PS N (SUTURE) IMPLANT
SUT MNCRL AB 4-0 PS2 18 (SUTURE) IMPLANT
SUT VIC AB 0 CT1 36 (SUTURE) ×2 IMPLANT
SUT VIC AB 1 CT1 36 (SUTURE) ×2 IMPLANT
SUT VIC AB 2-0 CT1 27 (SUTURE) ×2
SUT VIC AB 2-0 CT1 TAPERPNT 27 (SUTURE) ×4 IMPLANT
TRAY FOLEY MTR SLVR 16FR STAT (SET/KITS/TRAYS/PACK) IMPLANT
YANKAUER SUCT BULB TIP NO VENT (SUCTIONS) ×2 IMPLANT

## 2022-04-12 NOTE — Anesthesia Procedure Notes (Signed)
Procedure Name: MAC Date/Time: 04/12/2022 8:40 AM  Performed by: Claudia Desanctis, CRNAPre-anesthesia Checklist: Patient identified, Emergency Drugs available, Suction available and Patient being monitored Patient Re-evaluated:Patient Re-evaluated prior to induction Oxygen Delivery Method: Simple face mask

## 2022-04-12 NOTE — Anesthesia Procedure Notes (Signed)
Spinal  Patient location during procedure: OR Start time: 04/12/2022 8:36 AM End time: 04/12/2022 8:38 AM Reason for block: surgical anesthesia Staffing Performed: anesthesiologist  Anesthesiologist: Albertha Ghee, MD Performed by: Albertha Ghee, MD Authorized by: Albertha Ghee, MD   Preanesthetic Checklist Completed: patient identified, IV checked, risks and benefits discussed, surgical consent, monitors and equipment checked, pre-op evaluation and timeout performed Spinal Block Patient position: sitting Prep: DuraPrep Patient monitoring: cardiac monitor, continuous pulse ox and blood pressure Approach: midline Location: L3-4 Injection technique: single-shot Needle Needle type: Pencan  Needle gauge: 24 G Needle length: 9 cm Assessment Sensory level: T10 Events: CSF return Additional Notes Functioning IV was confirmed and monitors were applied. Sterile prep and drape, including hand hygiene and sterile gloves were used. The patient was positioned and the spine was prepped. The skin was anesthetized with lidocaine.  Free flow of clear CSF was obtained prior to injecting local anesthetic into the CSF.  The spinal needle aspirated freely following injection.  The needle was carefully withdrawn.  The patient tolerated the procedure well.

## 2022-04-12 NOTE — Interval H&P Note (Signed)
History and Physical Interval Note: The patient understands that he is here today for a right total hip arthroplasty to treat his severe right hip arthritis.  There has been no acute or interval change in medical status.  See recent H&P.  The risks and benefits of surgery have been described in detail and informed consent is obtained.  The right operative hip has been marked.  04/12/2022 7:01 AM  Ronnie Black  has presented today for surgery, with the diagnosis of OSTEOARTHRITIS / [place].  The various methods of treatment have been discussed with the patient and family. After consideration of risks, benefits and other options for treatment, the patient has consented to  Procedure(s): RIGHT TOTAL HIP ARTHROPLASTY ANTERIOR APPROACH (Right) as a surgical intervention.  The patient's history has been reviewed, patient examined, no change in status, stable for surgery.  I have reviewed the patient's chart and labs.  Questions were answered to the patient's satisfaction.     Mcarthur Rossetti

## 2022-04-12 NOTE — Progress Notes (Signed)
MDA notified that patients VSS, but patient will not respond even to sternal rub.  Patient has been in PACU for 30 minutes with oral airway present.  Will continue to monitor.

## 2022-04-12 NOTE — Op Note (Signed)
Operative Note  Date of operation: 04/12/2022 Preoperative diagnosis: Right hip primary osteoarthritis Postoperative diagnosis: Same  Procedure: Right direct anterior total hip arthroplasty  Implants: Implant Name Type Inv. Item Serial No. Manufacturer Lot No. LRB No. Used Action  CUP SECTOR GRIPTON 58MM - [id number] Orthopedic Implant CUP SECTOR GRIPTON 58MM  DEPUY ORTHOPA[employer] [id number] Right 1 Implanted  LINER NEUTRAL 36X58 PLUS4 - [id number]  LINER NEUTRAL 36X58 PLUS4  DEPUY ORTHOPAEDICS [id number] Right 1 Implanted  FEM STEM 12/14 TAPER SZ 4 HIP - [id number] Orthopedic Implant FEM STEM 12/14 TAPER SZ 4 HIP  DEPUY ORTHOPAEDICS [id number] Right 1 Implanted  ARTICULEZE HEAD - [id number] Hips ARTICULEZE HEAD  DEPUY ORTHOPAEDICS [id number] Right 1 Implanted   Surgeon: Lind Guest. Ninfa Linden, MD Assistant: Benita Stabile, PA-C  Anesthesia: Spinal EBL: 100 cc Antibiotics: IV Ancef Locations: None  Indications: The patient is a 77 year old gentleman with well-documented severe and stage arthritis of his right hip.  He actually has arthritis in his left hip but is asymptomatic with the left hip.  He has dealt with right hip pain for some time now.  His x-rays show bone-on-bone wear that hip.  He has tried and failed all forms of conservative treatment.  At this point his right hip pain is daily and it is detrimentally affecting his mobility, his quality of life, and his actives daily living and he does wish to proceed with a hip replacement and we agree with this as well.  We did talk about the risks of acute blood loss anemia, nerve or vessel injury, fracture, infection, DVT, implant failure, dislocation, leg length differences and wound healing issues.  We talked about her goals being hopefully decrease pain, improve mobility, and improve quality of life.  Procedure description: After informed consent was obtained and the appropriate right hip was marked, the patient was brought to the operating room and  set up on the stretcher where spinal anesthesia was obtained.  He was laid in the supine position on stretcher and assess his leg lengths which are equal.  A Foley catheter was placed in traction boots were placed on both his feet.  Next he was placed supine on the Hana fracture table with a perineal post in place in both legs and inline skeletal traction devices but no traction applied.  His right operative hip was assessed radiographically and then prepped and draped with DuraPrep and sterile drapes.  A timeout was called and he was identified as correct patient correct right hip.  An incision was then made just inferior and posterior to the ASIS and carried slightly obliquely down the leg.  Dissection was carried down to the tensor fascia lata muscle and tensor fascia was then divided longitudinally to proceed with a rotator approach to the hip.  Circumflex vessels were identified and cauterized and hip capsule identified in L-type format finding a moderate joint effusion.  Cobra retractors were placed around the medial lateral femoral neck and a femoral neck cut was made just proximal to the lesser trochanter with an oscillating saw and completed with an osteotome.  A corkscrew guide was placed in the femoral head and the femoral head was removed in its entirety finding a wide area devoid of cartilage.  It was a very large femoral head.  A bent Hohmann was then placed over the medial acetabular rim and remnants of the acetabular labrum and other debris were removed.  Reaming was then initiated under direct visitation from a size 43 reamer and stepwise increments going up  to a size 57 reamer with all reamers placed under direct visualization and the last reamer also placed under direct fluoroscopy in order to obtain the depth of reaming, the inclination and the anteversion.  The real DePuy sector [place] #component size 58 was then placed without difficulty and based off his offset we went with a 36+4 polythene  liner.  Attention was then turned to the femur.  With the right leg externally rotated to 120 degrees, extended and adducted, a Mueller retractor was placed by the medial calcar and a Hohmann retractor was placed behind the greater trochanter.  A box cutting osteotome was used into the femoral canal and then broaching was initiated from a size 0 broach and stepwise increments to a size 4 broach.  With a size 4 broach in place we trialed a standard offset femoral neck and a 36+1.5 hip ball.  This was reduced in the acetabulum we are pleased with leg length offset range of motion and stability assessed radiographically and clinically.  We dislocated the hip and remove the trial components.  We then placed the real Actis femoral component with high offset size 4 and a 36+1.5 hip ball.  This reduced in the acetabulum but I just felt like there was some instability so we removed that 36+1.5 hip ball going up to a 36+5 metal hip ball.  We then assessed it radiographically and clinically and we are pleased with leg length, offset, range of motion and stability.  This was assessed radiographically and clinically.  The soft tissue was then irrigated with normal saline solution.  The joint capsule was closed with interrupted #1 Ethibond suture followed by #1 Vicryl to close the tensor fascia.  0 Vicryl was used to close the deep tissue and 2-0 Vicryl was used to close subcutaneous tissue.  The skin was closed with staples.  An Aquacel dressing was applied.  The patient was taken off the Hana table and taken recovery room in stable addition.  Benita Stabile, PA-C did assist during the entire case and beginning to end and his assistance was crucial and medically necessary for soft tissue retraction and management, helping guide implant placement and a layered closure of the wound.

## 2022-04-12 NOTE — Transfer of Care (Signed)
Immediate Anesthesia Transfer of Care Note  Patient: Ronnie Black  Procedure(s) Performed: RIGHT TOTAL HIP ARTHROPLASTY ANTERIOR APPROACH (Right: Hip)  Patient Location: PACU  Anesthesia Type:Spinal  Level of Consciousness: drowsy  Airway & Oxygen Therapy: Patient Spontanous Breathing and Patient connected to face mask  Post-op Assessment: Report given to RN and Post -op Vital signs reviewed and stable  Post vital signs: Reviewed and stable  Last Vitals:  Vitals Value Taken Time  BP 83/48 04/12/22 1001  Temp 36.8 C 04/12/22 1001  Pulse 65 04/12/22 1004  Resp 12 04/12/22 1004  SpO2 98 % 04/12/22 1004  Vitals shown include unvalidated device data.  Last Pain:  Vitals:   04/12/22 0709  TempSrc: Oral  PainSc:          Complications: No notable events documented.

## 2022-04-12 NOTE — Progress Notes (Signed)
Patient too asleep to do motor function/assessment of lower extremities.  Oral Airway present.  Initial BP 83/48 treated by CRNA.  Will continue to monitor.

## 2022-04-12 NOTE — Evaluation (Signed)
Physical Therapy Evaluation Patient Details Name: Ronnie Black MRN: [id number] DOB: 10-11-1945 Today's Date: 04/12/2022  History of Present Illness  Pt s/p R THR and with hx of DDD, a-fib s/p ablation, and prostate CA  Clinical Impression  Pt s/p R THR and presents with decreased R LE strength/ROM and post op pain limiting functional mobility.  Pt plans to dc to rehab setting at [facility] to maximize IND and safety prior to dc home with limited assist     Recommendations for follow up therapy are one component of a multi-disciplinary discharge planning process, led by the attending physician.  Recommendations may be updated based on patient status, additional functional criteria and insurance authorization.  Follow Up Recommendations Follow physician's recommendations for discharge plan and follow up therapies      Assistance Recommended at Discharge Frequent or constant Supervision/Assistance  Patient can return home with the following  A little help with walking and/or transfers;A little help with bathing/dressing/bathroom;Assistance with cooking/housework;Help with stairs or ramp for entrance;Assist for transportation    Equipment Recommendations None recommended by PT  Recommendations for Other Services       Functional Status Assessment Patient has had a recent decline in their functional status and demonstrates the ability to make significant improvements in function in a reasonable and predictable amount of time.     Precautions / Restrictions Precautions Precautions: Fall Restrictions Weight Bearing Restrictions: No Other Position/Activity Restrictions: WBAT      Mobility  Bed Mobility Overal bed mobility: Needs Assistance Bed Mobility: Supine to Sit     Supine to sit: Min assist     General bed mobility comments: cues for sequence and use of L LE to self assist    Transfers Overall transfer level: Needs assistance Equipment used: Rolling walker (2  wheels) Transfers: Sit to/from Stand Sit to Stand: Min assist           General transfer comment: cues for LE management and use of UEs to self assist    Ambulation/Gait Ambulation/Gait assistance: Min assist Gait Distance (Feet): 48 Feet Assistive device: Rolling walker (2 wheels) Gait Pattern/deviations: Step-to pattern, Step-through pattern, Decreased step length - right, Decreased step length - left, Shuffle, Trunk flexed       General Gait Details: cues for posture, position from RW and initial sequence  Stairs            Wheelchair Mobility    Modified Rankin (Stroke Patients Only)       Balance Overall balance assessment: Mild deficits observed, not formally tested                                           Pertinent Vitals/Pain Pain Assessment Pain Assessment: 0-10 Pain Score: 7  Pain Location: R hip Pain Descriptors / Indicators: Aching, Sore Pain Intervention(s): Limited activity within patient's tolerance, Monitored during session, RN gave pain meds during session, Ice applied    Home Living Family/patient expects to be discharged to:: Inpatient rehab                   Additional Comments: at [facility]    Prior Function Prior Level of Function : Independent/Modified Independent                     Hand Dominance        Extremity/Trunk Assessment  Upper Extremity Assessment Upper Extremity Assessment: Overall WFL for tasks assessed    Lower Extremity Assessment Lower Extremity Assessment: RLE deficits/detail    Cervical / Trunk Assessment Cervical / Trunk Assessment: Normal  Communication   Communication: No difficulties  Cognition Arousal/Alertness: Awake/alert Behavior During Therapy: WFL for tasks assessed/performed Overall Cognitive Status: Within Functional Limits for tasks assessed                                          General Comments      Exercises Total Joint  Exercises Ankle Circles/Pumps: AROM, Both, 15 reps, Supine   Assessment/Plan    PT Assessment Patient needs continued PT services  PT Problem List Decreased strength;Decreased range of motion;Decreased activity tolerance;Decreased balance;Decreased mobility;Decreased knowledge of use of DME;Pain       PT Treatment Interventions DME instruction;Gait training;Stair training;Functional mobility training;Therapeutic activities;Therapeutic exercise;Patient/family education    PT Goals (Current goals can be found in the Care Plan section)  Acute Rehab PT Goals Patient Stated Goal: REgain IND PT Goal Formulation: With patient Time For Goal Achievement: 04/19/22 Potential to Achieve Goals: Good    Frequency 7X/week     Co-evaluation               AM-PAC PT "6 Clicks" Mobility  Outcome Measure Help needed turning from your back to your side while in a flat bed without using bedrails?: A Little Help needed moving from lying on your back to sitting on the side of a flat bed without using bedrails?: A Little Help needed moving to and from a bed to a chair (including a wheelchair)?: A Little Help needed standing up from a chair using your arms (e.g., wheelchair or bedside chair)?: A Little Help needed to walk in hospital room?: A Little Help needed climbing 3-5 steps with a railing? : A Little 6 Click Score: 18    End of Session Equipment Utilized During Treatment: Gait belt Activity Tolerance: Patient tolerated treatment well;Patient limited by pain Patient left: in chair;with call bell/phone within reach;with chair alarm set;with family/visitor present Nurse Communication: Mobility status PT Visit Diagnosis: Difficulty in walking, not elsewhere classified (R26.2)    Time: [id number] PT Time Calculation (min) (ACUTE ONLY): 15 min   Charges:   PT Evaluation $PT Eval Low Complexity: 1 Low          [place] Pager  [phone] Office [phone]   Khyler Eschmann 04/12/2022, 4:48 PM

## 2022-04-12 NOTE — NC FL2 (Signed)
[place] MEDICAID FL2 LEVEL OF CARE FORM     IDENTIFICATION  Patient Name: Ronnie Black Birthdate: Jun 11, 1945 Sex: male Admission Date (Current Location): 04/12/2022  [facility] and [place] Number:  Herbalist and Address:  [facility],  [address] [place], [place]      Provider Number: [id number]  Attending Physician Name and Address:  Mcarthur Rossetti,*  Relative Name and Phone Number:  Flynn Frezza [phone]    Current Level of Care: Hospital Recommended Level of Care: [place] Prior Approval Number:    Date Approved/Denied:   PASRR Number:    Discharge Plan: SNF    Current Diagnoses: Patient Active Problem List   Diagnosis Date Noted   Status post total replacement of right hip 04/12/2022   Hypercoagulable state due to persistent atrial fibrillation ([place]) 04/01/2022   Unilateral primary osteoarthritis, right hip 02/07/2022   Aortic atherosclerosis ([place]) 05/22/2021   Malignant neoplasm of prostate ([place]) 05/08/2021   Bilateral primary osteoarthritis of hip 12/04/2020   Essential hypertension 12/01/2019   Current use of long term anticoagulation 11/06/2018   Atrial fibrillation status post cardioversion ([place]) 02/24/2017   Persistent atrial fibrillation ([place]) 11/21/2016   Benign prostatic hyperplasia without lower urinary tract symptoms 11/07/2016   Family history of colon cancer 10/01/2012   Herniated lumbar disc without myelopathy 07/08/2012   ED (erectile dysfunction) 01/07/2012   Actinic keratosis 01/07/2012   Seborrheic keratosis 01/07/2012   CAROTID BRUIT 08/24/2008   Mixed hyperlipidemia 08/13/2008   HOCM (hypertrophic obstructive cardiomyopathy) ([place]) 08/13/2008    Orientation RESPIRATION BLADDER Height & Weight     Self, Time, Situation, Place  Normal Continent Weight: 153 lb (69.4 kg) Height:  5\' 6"  (167.6 cm)  BEHAVIORAL SYMPTOMS/MOOD NEUROLOGICAL BOWEL NUTRITION STATUS      Continent  Diet (regular)  AMBULATORY STATUS COMMUNICATION OF NEEDS Skin   Limited Assist   Other (Comment) (surgical incision only)                       Personal Care Assistance Level of Assistance  Bathing, Dressing Bathing Assistance: Limited assistance   Dressing Assistance: Limited assistance     Functional Limitations Info  Sight, Speech, Hearing Sight Info: Adequate Hearing Info: Adequate Speech Info: Adequate    SPECIAL CARE FACTORS FREQUENCY  PT (By licensed PT), OT (By licensed OT)     PT Frequency: 5x/wk OT Frequency: 5x/wk            Contractures      Additional Factors Info  Code Status, Allergies Code Status Info: Full Allergies Info: NKDA           Current Medications (04/12/2022):  This is the current hospital active medication list Current Facility-Administered Medications  Medication Dose Route Frequency Provider Last Rate Last Admin   0.9 %  sodium chloride infusion   Intravenous Continuous Mcarthur Rossetti, MD       [START ON 04/13/2022] acetaminophen (TYLENOL) tablet 325-650 mg  325-650 mg Oral Q6H PRN Mcarthur Rossetti, MD       alum & mag hydroxide-simeth (MAALOX/MYLANTA) 200-200-20 MG/5ML suspension 30 mL  30 mL Oral Q4H PRN Mcarthur Rossetti, MD       [START ON 04/13/2022] apixaban (ELIQUIS) tablet 5 mg  5 mg Oral BID Mcarthur Rossetti, MD       [START ON 04/13/2022] atorvastatin (LIPITOR) tablet 10 mg  10 mg Oral Daily Mcarthur Rossetti, MD  ceFAZolin (ANCEF) IVPB 1 g/50 mL premix  1 g Intravenous Q6H Mcarthur Rossetti, MD       diphenhydrAMINE (BENADRYL) 12.5 MG/5ML elixir 12.5-25 mg  12.5-25 mg Oral Q4H PRN Mcarthur Rossetti, MD       docusate sodium (COLACE) capsule 100 mg  100 mg Oral BID Mcarthur Rossetti, MD       dofetilide [facility]) capsule 500 mcg  500 mcg Oral BID Mcarthur Rossetti, MD       HYDROcodone-acetaminophen (NORCO) 7.5-325 MG per tablet 1-2 tablet  1-2 tablet Oral Q4H  PRN Mcarthur Rossetti, MD       HYDROcodone-acetaminophen (NORCO/VICODIN) 5-325 MG per tablet 1-2 tablet  1-2 tablet Oral Q4H PRN Mcarthur Rossetti, MD       [START ON 04/13/2022] losartan (COZAAR) tablet 25 mg  25 mg Oral Daily Mcarthur Rossetti, MD       melatonin tablet 5 mg  5 mg Oral QHS Mcarthur Rossetti, MD       menthol-cetylpyridinium (CEPACOL) lozenge 3 mg  1 lozenge Oral PRN Mcarthur Rossetti, MD       Or   phenol (CHLORASEPTIC) mouth spray 1 spray  1 spray Mouth/Throat PRN Mcarthur Rossetti, MD       methocarbamol (ROBAXIN) tablet 500 mg  500 mg Oral Q6H PRN Mcarthur Rossetti, MD       Or   methocarbamol (ROBAXIN) 500 mg in dextrose 5 % 50 mL IVPB  500 mg Intravenous Q6H PRN Mcarthur Rossetti, MD       metoCLOPramide (REGLAN) tablet 5-10 mg  5-10 mg Oral Q8H PRN Mcarthur Rossetti, MD       Or   metoCLOPramide (REGLAN) injection 5-10 mg  5-10 mg Intravenous Q8H PRN Mcarthur Rossetti, MD       metoprolol tartrate (LOPRESSOR) tablet 25 mg  25 mg Oral BID Mcarthur Rossetti, MD       morphine (PF) 2 MG/ML injection 0.5-1 mg  0.5-1 mg Intravenous Q2H PRN Mcarthur Rossetti, MD       ondansetron [facility]) tablet 4 mg  4 mg Oral Q6H PRN Mcarthur Rossetti, MD       Or   ondansetron [facility]) injection 4 mg  4 mg Intravenous Q6H PRN Mcarthur Rossetti, MD       [START ON 04/13/2022] tamsulosin (FLOMAX) capsule 0.4 mg  0.4 mg Oral Daily Mcarthur Rossetti, MD         Discharge Medications: Please see discharge summary for a list of discharge medications.  Relevant Imaging Results:  Relevant Lab Results:   Additional Information SS# [id number]  Lennart Pall, LCSW

## 2022-04-12 NOTE — TOC Initial Note (Signed)
Transition of Care [facility]) - Initial/Assessment Note    Patient Details  Name: Ronnie Black MRN: [id number] Date of Birth: 11-Dec-1945  Transition of Care [facility]) CM/SW Contact:    Lennart Pall, LCSW Phone Number: 04/12/2022, 1:18 PM  Clinical Narrative:                 Met with pt to review dc plans/ needs.  Pt reports that he is a resident at [facility] in [place] but has already arranged to dc to SNF rehab area when cleared for dc (anticipates tomorrow).  Have confirmed with Tim Lair, RN at [facility], that SNF rehab bed is ready for pt and they, also, anticipate admission tomorrow.  Have completed FL2 and provided to [facility].  They ask that TOC contact the supervisor covering weekend, Erin @ [phone] when dc summary completed.    Expected Discharge Plan: Skilled Nursing Facility Barriers to Discharge: No Barriers Identified   Patient Goals and CMS Choice Patient states their goals for this hospitalization and ongoing recovery are:: return to [place] at [facility] after completing rehab          Expected Discharge Plan and Services In-house Referral: Clinical Social Work   Post Acute Care Choice: [place] Living arrangements for the past 2 months: [place]                 DME Arranged: N/A DME Agency: NA                  Prior Living Arrangements/Services Living arrangements for the past 2 months: [place] Lives with:: Self Patient language and need for interpreter reviewed:: Yes Do you feel safe going back to the place where you live?: Yes      Need for Family Participation in Patient Care: No (Comment) Care giver support system in place?: Yes (comment)   Criminal Activity/Legal Involvement Pertinent to Current Situation/Hospitalization: No - Comment as needed  Activities of Daily Living Home Assistive Devices/Equipment: Eyeglasses, Blood pressure cuff, Grab bars in shower, Built-in  shower seat, Hand-held shower hose ADL Screening (condition at time of admission) Patient's cognitive ability adequate to safely complete daily activities?: Yes Is the patient deaf or have difficulty hearing?: No Does the patient have difficulty seeing, even when wearing glasses/contacts?: No Does the patient have difficulty concentrating, remembering, or making decisions?: No Patient able to express need for assistance with ADLs?: Yes Does the patient have difficulty dressing or bathing?: No Independently performs ADLs?: Yes (appropriate for developmental age) Does the patient have difficulty walking or climbing stairs?: Yes Weakness of Legs: Right Weakness of Arms/Hands: None  Permission Sought/Granted Permission sought to share information with : Facility Art therapist granted to share information with : Yes, Verbal Permission Granted     Permission granted to share info w AGENCY: [facility]        Emotional Assessment Appearance:: Appears stated age Attitude/Demeanor/Rapport: Gracious Affect (typically observed): Accepting Orientation: : Oriented to Self, Oriented to Place, Oriented to  Time, Oriented to Situation Alcohol / Substance Use: Not Applicable Psych Involvement: No (comment)  Admission diagnosis:  Status post total replacement of right hip [Z96.641] Patient Active Problem List   Diagnosis Date Noted   Status post total replacement of right hip 04/12/2022   Hypercoagulable state due to persistent atrial fibrillation ([place]) 04/01/2022   Unilateral primary osteoarthritis, right hip 02/07/2022   Aortic atherosclerosis ([place]) 05/22/2021   Malignant  neoplasm of prostate ([place]) 05/08/2021   Bilateral primary osteoarthritis of hip 12/04/2020   Essential hypertension 12/01/2019   Current use of long term anticoagulation 11/06/2018   Atrial fibrillation status post cardioversion ([place]) 02/24/2017   Persistent atrial fibrillation ([place]) 11/21/2016    Benign prostatic hyperplasia without lower urinary tract symptoms 11/07/2016   Family history of colon cancer 10/01/2012   Herniated lumbar disc without myelopathy 07/08/2012   ED (erectile dysfunction) 01/07/2012   Actinic keratosis 01/07/2012   Seborrheic keratosis 01/07/2012   CAROTID BRUIT 08/24/2008   Mixed hyperlipidemia 08/13/2008   HOCM (hypertrophic obstructive cardiomyopathy) ([place]) 08/13/2008   PCP:  [name], MD Pharmacy:   [facility] [place] [address] Phone: [phone] Fax: [phone]     Social Determinants of Health (SDOH) Social History: SDOH Screenings   Food Insecurity: No Food Insecurity (04/12/2022)  Housing: Low Risk  (04/12/2022)  Transportation Needs: No Transportation Needs (04/12/2022)  Utilities: Not At Risk (04/12/2022)  Depression (PHQ2-9): Low Risk  (01/30/2022)  Financial Resource Strain: Low Risk  (06/09/2020)  Physical Activity: Sufficiently Active (06/09/2020)  Social Connections: Moderately Isolated (06/09/2020)  Stress: No Stress Concern Present (06/09/2020)  Tobacco Use: Medium Risk (04/12/2022)   SDOH Interventions: Food Insecurity Interventions: Intervention Not Indicated Housing Interventions: Intervention Not Indicated Transportation Interventions: Intervention Not Indicated Utilities Interventions: Intervention Not Indicated   Readmission Risk Interventions     No data to display

## 2022-04-12 NOTE — Progress Notes (Signed)
MDA at bedside.  Patient still not responding.  VSS.  No new orders at this time.  Continue to monitor.  Oral airway still in place. Lab at bedside.  Will continue to monitor and notify for further changes.

## 2022-04-13 ENCOUNTER — Encounter (HOSPITAL_COMMUNITY): Payer: Self-pay | Admitting: Orthopaedic Surgery

## 2022-04-13 DIAGNOSIS — Z87891 Personal history of nicotine dependence: Secondary | ICD-10-CM | POA: Diagnosis not present

## 2022-04-13 DIAGNOSIS — I10 Essential (primary) hypertension: Secondary | ICD-10-CM | POA: Diagnosis not present

## 2022-04-13 DIAGNOSIS — M1611 Unilateral primary osteoarthritis, right hip: Secondary | ICD-10-CM | POA: Diagnosis not present

## 2022-04-13 DIAGNOSIS — I4819 Other persistent atrial fibrillation: Secondary | ICD-10-CM | POA: Diagnosis not present

## 2022-04-13 DIAGNOSIS — Z8546 Personal history of malignant neoplasm of prostate: Secondary | ICD-10-CM | POA: Diagnosis not present

## 2022-04-13 DIAGNOSIS — Z7901 Long term (current) use of anticoagulants: Secondary | ICD-10-CM | POA: Diagnosis not present

## 2022-04-13 DIAGNOSIS — Z79899 Other long term (current) drug therapy: Secondary | ICD-10-CM | POA: Diagnosis not present

## 2022-04-13 LAB — BASIC METABOLIC PANEL
Anion gap: 8 (ref 5–15)
BUN: 11 mg/dL (ref 8–23)
CO2: 28 mmol/L (ref 22–32)
Calcium: 9 mg/dL (ref 8.9–10.3)
Chloride: 102 mmol/L (ref 98–111)
Creatinine, Ser: 0.78 mg/dL (ref 0.61–1.24)
GFR, Estimated: >60 mL/min (ref >60)
Glucose, Bld: 129 mg/dL — ABNORMAL HIGH (ref 70–99)
Potassium: 4.7 mmol/L (ref 3.5–5.1)
Sodium: 138 mmol/L (ref 135–145)

## 2022-04-13 LAB — CBC
HCT: 42.7 % (ref 39.0–52.0)
Hemoglobin: 14.1 g/dL (ref 13.0–17.0)
MCH: 29.1 pg (ref 26.0–34.0)
MCHC: 33 g/dL (ref 30.0–36.0)
MCV: 88.2 fL (ref 80.0–100.0)
Platelets: 192 10*3/uL (ref 150–400)
RBC: 4.84 MIL/uL (ref 4.22–5.81)
RDW: 12.6 % (ref 11.5–15.5)
WBC: 11.6 10*3/uL — ABNORMAL HIGH (ref 4.0–10.5)
nRBC: 0 % (ref 0.0–0.2)

## 2022-04-13 MED ORDER — HYDROCODONE-ACETAMINOPHEN 5-325 MG PO TABS: 1.0000 | ORAL_TABLET | ORAL | 0 refills | Status: DC | PRN

## 2022-04-13 MED ORDER — METHOCARBAMOL 500 MG PO TABS: 500.0000 mg | ORAL_TABLET | Freq: Four times a day (QID) | ORAL | 1 refills | Status: DC | PRN

## 2022-04-13 NOTE — Progress Notes (Signed)
Pt alert and oreinted. Surgical dressing clean, dry and intact. No questions or queries regarding discharge instruction. Report called to [facility] rehab nurse [place]. Daughter to trasport pt to rehab.

## 2022-04-13 NOTE — Progress Notes (Signed)
Physical Therapy Treatment Patient Details Name: Ronnie Black MRN: [id number] DOB: 1945-10-28 Today's Date: 04/13/2022   History of Present Illness Pt s/p R THR and with hx of DDD, a-fib s/p ablation, and prostate CA    PT Comments    Pt motivated and progressing well with mobility.  Pt plans dc to [facility] rehab this date.  Recommendations for follow up therapy are one component of a multi-disciplinary discharge planning process, led by the attending physician.  Recommendations may be updated based on patient status, additional functional criteria and insurance authorization.  Follow Up Recommendations  Follow physician's recommendations for discharge plan and follow up therapies     Assistance Recommended at Discharge Frequent or constant Supervision/Assistance  Patient can return home with the following A little help with walking and/or transfers;A little help with bathing/dressing/bathroom;Assistance with cooking/housework;Help with stairs or ramp for entrance;Assist for transportation   Equipment Recommendations  None recommended by PT    Recommendations for Other Services       Precautions / Restrictions Precautions Precautions: Fall Restrictions Weight Bearing Restrictions: No Other Position/Activity Restrictions: WBAT     Mobility  Bed Mobility               General bed mobility comments: Pt up in chair and requests back to same    Transfers Overall transfer level: Needs assistance Equipment used: Rolling walker (2 wheels) Transfers: Sit to/from Stand Sit to Stand: Min guard           General transfer comment: cues for LE management and use of UEs to self assist    Ambulation/Gait Ambulation/Gait assistance: Min guard Gait Distance (Feet): 110 Feet Assistive device: Rolling walker (2 wheels) Gait Pattern/deviations: Step-to pattern, Step-through pattern, Decreased step length - right, Decreased step length - left, Shuffle, Trunk flexed        General Gait Details: cues for posture, position from RW and initial sequence   Stairs             Wheelchair Mobility    Modified Rankin (Stroke Patients Only)       Balance Overall balance assessment: Mild deficits observed, not formally tested                                          Cognition Arousal/Alertness: Awake/alert Behavior During Therapy: WFL for tasks assessed/performed Overall Cognitive Status: Within Functional Limits for tasks assessed                                          Exercises Total Joint Exercises Ankle Circles/Pumps: AROM, Both, 15 reps, Supine Quad Sets: AROM, Both, 10 reps, Supine Heel Slides: AAROM, Right, 20 reps, Supine Hip ABduction/ADduction: AAROM, Right, 15 reps, Supine    General Comments        Pertinent Vitals/Pain Pain Assessment Pain Assessment: 0-10 Pain Score: 5  Pain Location: R hip Pain Descriptors / Indicators: Aching, Sore Pain Intervention(s): Limited activity within patient's tolerance, Monitored during session, Premedicated before session, Ice applied    Home Living                          Prior Function            PT Goals (current goals can  now be found in the care plan section) Acute Rehab PT Goals Patient Stated Goal: REgain IND PT Goal Formulation: With patient Time For Goal Achievement: 04/19/22 Potential to Achieve Goals: Good Progress towards PT goals: Progressing toward goals    Frequency    7X/week      PT Plan Current plan remains appropriate    Co-evaluation              AM-PAC PT "6 Clicks" Mobility   Outcome Measure  Help needed turning from your back to your side while in a flat bed without using bedrails?: A Little Help needed moving from lying on your back to sitting on the side of a flat bed without using bedrails?: A Little Help needed moving to and from a bed to a chair (including a wheelchair)?: A  Little Help needed standing up from a chair using your arms (e.g., wheelchair or bedside chair)?: A Little Help needed to walk in hospital room?: A Little Help needed climbing 3-5 steps with a railing? : A Little 6 Click Score: 18    End of Session Equipment Utilized During Treatment: Gait belt Activity Tolerance: Patient tolerated treatment well;Patient limited by pain Patient left: in chair;with call bell/phone within reach;with chair alarm set;with family/visitor present Nurse Communication: Mobility status PT Visit Diagnosis: Difficulty in walking, not elsewhere classified (R26.2)     Time: [id number] PT Time Calculation (min) (ACUTE ONLY): 29 min  Charges:  $Gait Training: 8-22 mins $Therapeutic Exercise: 8-22 mins                     Debe Coder PT Acute Rehabilitation Services Pager [phone] Office [phone]    Seba Madole 04/13/2022, 12:19 PM

## 2022-04-13 NOTE — Progress Notes (Signed)
Subjective: 1 Day Post-Op Procedure(s) (LRB): RIGHT TOTAL HIP ARTHROPLASTY ANTERIOR APPROACH (Right) Patient reports pain as mild.  Wanting to discharge back to [facility] today.   Objective: Vital signs in last 24 hours: Temp:  [97.1 F (36.2 C)-98.3 F (36.8 C)] 97.8 F (36.6 C) (03/23 0507) Pulse Rate:  [56-86] 74 (03/23 0507) Resp:  [11-18] 18 (03/23 0507) BP: (83-145)/(48-82) 130/78 (03/23 0507) SpO2:  [97 %-100 %] 98 % (03/23 0507)  Intake/Output from previous day: 03/22 0701 - 03/23 0700 In: 3062.2 [P.O.:960; I.V.:1901.5; IV Piggyback:200.7] Out: 3400 [Urine:3300; Blood:100] Intake/Output this shift: Total I/O In: 240 [P.O.:240] Out: 300 [Urine:300]  Recent Labs    04/13/22 0335  HGB 14.1   Recent Labs    04/13/22 0335  WBC 11.6*  RBC 4.84  HCT 42.7  PLT 192   Recent Labs    04/13/22 0335  NA 138  K 4.7  CL 102  CO2 28  BUN 11  CREATININE 0.78  GLUCOSE 129*  CALCIUM 9.0   No results for input(s): "LABPT", "INR" in the last 72 hours.  Dorsiflexion/Plantar flexion intact Incision: scant drainage Compartment soft   Assessment/Plan: 1 Day Post-Op Procedure(s) (LRB): RIGHT TOTAL HIP ARTHROPLASTY ANTERIOR APPROACH (Right) Up with therapy Discharge back to independent living at [facility] later today if patient remains stable and progresses well with PT.  Hip dressing changed.      Wessie Shanks 04/13/2022, 9:35 AM

## 2022-04-13 NOTE — TOC Transition Note (Addendum)
Transition of Care [facility]) - CM/SW Discharge Note   Patient Details  Name: Ronnie Black MRN: [id number] Date of Birth: Dec 30, 1945  [facility] CM/SW Contact:  Roseanne Kaufman, RN Phone Number: 04/13/2022, 10:20 AM   Clinical Narrative:   Per chart review patient will go to [facility] rehab then transition to [facility] independent [facility]. This RNCM spoke with Ermalinda Barrios at [facility] who reports [facility] will get insurance auth and pull discharge summary.   Report can be called [facility], room# 51, MD, RN notified.  This RNCM will coordinate PTAR transportation.   - 10:39am This RNCM received call from [facility] with [facility] who is now requesting report be called to her Ermalinda Barrios) at [phone]. Will notify RN.  - 11:00am This RNCM spoke with patient who reports his daughter will transport him to [facility]. RN notified  No additional TOC needs.  Final next level of care: Skilled Nursing Facility Barriers to Discharge: No Barriers Identified   Patient Goals and CMS Choice CMS Medicare.gov Compare Post Acute Care list provided to:: Patient Choice offered to / list presented to : Patient  Discharge Placement                Patient chooses bed at: [facility] Patient to be transferred to facility by: POV/family      Discharge Plan and Services Additional resources added to the After Visit Summary for   In-house Referral: Clinical Social Work   Post Acute Care Choice: [facility]          DME Arranged: N/A DME Agency: NA         HH Agency: NA        Social Determinants of Health (SDOH) Interventions SDOH Screenings   Food Insecurity: No Food Insecurity (04/12/2022)  Housing: Low Risk  (04/12/2022)  Transportation Needs: No Transportation Needs (04/12/2022)  Utilities: Not At Risk (04/12/2022)  Depression (PHQ2-9): Low Risk  (01/30/2022)  Financial Resource Strain: Low Risk  (06/09/2020)  Physical Activity:  Sufficiently Active (06/09/2020)  Social Connections: Moderately Isolated (06/09/2020)  Stress: No Stress Concern Present (06/09/2020)  Tobacco Use: Medium Risk (04/13/2022)     Readmission Risk Interventions     No data to display

## 2022-04-13 NOTE — Discharge Instructions (Signed)

## 2022-04-13 NOTE — Plan of Care (Signed)

## 2022-04-13 NOTE — Discharge Summary (Signed)
Patient ID: Ronnie Black MRN: [id number] DOB/AGE: 79-Feb-1947 79 y.o.  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 male who was admitted 04/12/2022 for operative treatment ofUnilateral primary osteoarthritis, right hip. Patient has severe unremitting pain that affects sleep, daily activities, and work/hobbies. After pre-op clearance the patient was taken to the operating room on 04/12/2022 and underwent  Procedure(s): RIGHT TOTAL HIP ARTHROPLASTY ANTERIOR APPROACH.    Patient was given perioperative antibiotics:  Anti-infectives (From admission, onward)    Start     Dose/Rate Route Frequency Ordered Stop   04/12/22 1400  ceFAZolin (ANCEF) IVPB 1 g/50 mL premix        1 g 100 mL/hr over 30 Minutes Intravenous Every 6 hours 04/12/22 1143 04/12/22 2024   04/12/22 0645  ceFAZolin (ANCEF) IVPB 2g/100 mL premix        2 g 200 mL/hr over 30 Minutes Intravenous On call to O.R. 04/12/22 [id number] 04/12/22 0841        Patient was given sequential compression devices, early ambulation, and chemoprophylaxis to prevent DVT.  Patient benefited maximally from hospital stay and there were no complications.    Recent vital  signs: Patient Vitals for the past 24 hrs:  BP Temp Temp src Pulse Resp SpO2  04/13/22 0507 130/78 97.8 F (36.6 C) Oral 74 18 98 %  04/13/22 0147 129/82 97.7 F (36.5 C) Oral 78 16 97 %  04/12/22 2031 119/75 97.9 F (36.6 C) Oral 85 17 97 %  04/12/22 1722 (!) 145/69 98.1 F (36.7 C) Oral 86 16 99 %  04/12/22 1335 (!) 140/66 (!) 97.4 F (36.3 C) Oral 80 16 100 %  04/12/22 1146 123/77 (!) 97.5 F (36.4 C) Axillary 66 17 99 %  04/12/22 1123 -- (!) 97.1 F (36.2 C) -- -- -- --  04/12/22 1115 114/71 -- -- 69 16 98 %  04/12/22 1100 108/68 -- -- (!) 56 13 100 %  04/12/22 1045 96/64 -- -- 66 12 100 %  04/12/22 1030 103/61 -- -- 66 12 99 %  04/12/22 1015 (!) 92/51 -- -- 66 11 98 %  04/12/22 1001 (!) 83/48 98.3 F (36.8 C) -- 63 11 97 %     Recent laboratory studies:  Recent Labs    04/13/22 0335  WBC 11.6*  HGB 14.1  HCT 42.7  PLT 192  NA 138  K 4.7  CL 102  CO2 28  BUN 11  CREATININE 0.78  GLUCOSE 129*  CALCIUM 9.0     Discharge Medications:   Allergies as of 04/13/2022   No Known Allergies      Medication List     STOP taking these medications  Comirnaty syringe Generic drug: COVID-19 mRNA vaccine 2023-2024       TAKE these medications    atorvastatin 10 MG tablet Commonly known as: LIPITOR TAKE 1 TABLET BY MOUTH EVERY DAY   dofetilide 500 MCG capsule Commonly known as: TIKOSYN TAKE 1 CAPSULE BY MOUTH TWICE A DAY   Eliquis 5 MG Tabs tablet Generic drug: apixaban TAKE 1 TABLET BY MOUTH TWICE A DAY   fluticasone 0.005 % ointment Commonly known as: CUTIVATE Apply 1 Application topically as needed.   HYDROcodone-acetaminophen 5-325 MG tablet Commonly known as: NORCO/VICODIN Take 1-2 tablets by mouth every 4 (four) hours as needed for moderate pain (pain score 4-6).   losartan 25 MG tablet Commonly known as: COZAAR TAKE 1 TABLET (25 MG TOTAL) BY MOUTH DAILY.   melatonin 5 MG Tabs Take 5 mg by mouth at bedtime.   methocarbamol 500 MG  tablet Commonly known as: ROBAXIN Take 1 tablet (500 mg total) by mouth every 6 (six) hours as needed for muscle spasms.   metoprolol tartrate 25 MG tablet Commonly known as: LOPRESSOR TAKE 1 TABLET BY MOUTH TWICE A DAY   multivitamin with minerals Tabs tablet Take 1 tablet by mouth 4 (four) times a week.   sodium chloride 0.65 % Soln nasal spray Commonly known as: OCEAN Place 1 spray into both nostrils as needed (dryness).   tamsulosin 0.4 MG Caps capsule Commonly known as: FLOMAX Take 1 capsule (0.4 mg total) by mouth daily.               Durable Medical Equipment  (From admission, onward)           Start     Ordered   04/12/22 1144  DME 3 n 1  Once        04/12/22 1143   04/12/22 1144  DME Walker rolling  Once       Question Answer Comment  Walker: With 5 Inch Wheels   Patient needs a walker to treat with the following condition Status post total replacement of right hip      04/12/22 1143            Diagnostic Studies: DG Pelvis Portable  Result Date: 04/12/2022 CLINICAL DATA:  Status post total right hip replacement EXAM: PORTABLE PELVIS 1-2 VIEWS COMPARISON:  04/24/2021 CT abdomen pelvis FINDINGS: Status post right total hip replacement. No periprosthetic lucency or fracture. Near anatomic alignment of the prosthetic components. Air within the soft tissues is not unexpected postoperatively. Superficial skin staples. Redemonstrated severe degenerative changes in the left hip. Brachytherapy seeds overlie the prostate. IMPRESSION: Expected postoperative appearance, status post right total hip replacement. Electronically Signed   By: Merilyn Baba M.D.   On: 04/12/2022 12:16   DG HIP UNILAT WITH PELVIS 1V RIGHT  Result Date: 04/12/2022 CLINICAL DATA:  Intraoperative right hip replacement. EXAM: DG HIP (WITH OR WITHOUT PELVIS) 1V RIGHT COMPARISON:  Pelvis and bilateral hip radiographs 12/01/2020 FINDINGS: Images were performed intraoperatively without the  presence of a radiologist. Interval total right hip arthroplasty. No hardware complication is seen. Brachytherapy seeds overlie the prostate bed. Severe left femoroacetabular joint space narrowing and peripheral osteophytosis. Total fluoroscopy images: 4 Total fluoroscopy time: 18 seconds Total dose: Radiation Exposure Index (as provided by the fluoroscopic device): 1.8 mGy air Kerma Please see intraoperative findings for further detail. IMPRESSION: Intraoperative images during total right hip arthroplasty. Electronically Signed   By: Yvonne Kendall M.D.   On: 04/12/2022 10:56   DG C-Arm 1-60  Min-No Report  Result Date: 04/12/2022 Fluoroscopy was utilized by the requesting physician.  No radiographic interpretation.    Disposition: Discharge disposition: [facility] Not Defined       Discharge Instructions     Diet - low sodium heart healthy   Complete by: As directed    Increase activity slowly   Complete by: As directed         Contact information for follow-up providers     Mcarthur Rossetti, MD Follow up in 2 week(s).   Specialty: Orthopedic Surgery Contact information: [place] [address] [phone]              Contact information for after-discharge care     Destination     [facility] .   Service: Skilled Nursing Contact information: [place] [phone]                      Signed: Erskine Emery 04/13/2022, 9:41 AM

## 2022-04-14 ENCOUNTER — Encounter (HOSPITAL_COMMUNITY): Payer: Self-pay | Admitting: Orthopaedic Surgery

## 2022-04-14 NOTE — Anesthesia Postprocedure Evaluation (Signed)
Anesthesia Post Note  Patient: Ronnie Black  Procedure(s) Performed: RIGHT TOTAL HIP ARTHROPLASTY ANTERIOR APPROACH (Right: Hip)     Patient location during evaluation: PACU Anesthesia Type: MAC and Spinal Level of consciousness: oriented and awake and alert Pain management: pain level controlled Vital Signs Assessment: post-procedure vital signs reviewed and stable Respiratory status: spontaneous breathing, respiratory function stable and patient connected to nasal cannula oxygen Cardiovascular status: blood pressure returned to baseline and stable Postop Assessment: no headache, no backache and no apparent nausea or vomiting Anesthetic complications: no   No notable events documented.  Last Vitals:  Vitals:   04/13/22 0945 04/13/22 0956  BP: (!) 144/85 (!) 145/74  Pulse: 89   Resp: 16   Temp: 36.9 C   SpO2: 100%     Last Pain:  Vitals:   04/13/22 1232  TempSrc:   PainSc: 6                  Arlis Yale S

## 2022-04-15 ENCOUNTER — Non-Acute Institutional Stay (SKILLED_NURSING_FACILITY): Payer: Medicare PPO | Admitting: Internal Medicine

## 2022-04-15 ENCOUNTER — Encounter: Payer: Self-pay | Admitting: Internal Medicine

## 2022-04-15 ENCOUNTER — Telehealth: Payer: Self-pay | Admitting: *Deleted

## 2022-04-15 ENCOUNTER — Other Ambulatory Visit: Payer: Self-pay

## 2022-04-15 ENCOUNTER — Encounter (HOSPITAL_COMMUNITY): Payer: Self-pay

## 2022-04-15 ENCOUNTER — Emergency Department (HOSPITAL_COMMUNITY)
Admission: EM | Admit: 2022-04-15 | Discharge: 2022-04-16 | Disposition: A | Discharge status: Home / Self Care | Payer: Medicare PPO | Attending: Emergency Medicine | Admitting: Emergency Medicine

## 2022-04-15 DIAGNOSIS — N400 Enlarged prostate without lower urinary tract symptoms: Secondary | ICD-10-CM

## 2022-04-15 DIAGNOSIS — I4892 Unspecified atrial flutter: Secondary | ICD-10-CM

## 2022-04-15 DIAGNOSIS — Z79899 Other long term (current) drug therapy: Secondary | ICD-10-CM | POA: Diagnosis not present

## 2022-04-15 DIAGNOSIS — I480 Paroxysmal atrial fibrillation: Secondary | ICD-10-CM

## 2022-04-15 DIAGNOSIS — Z96641 Presence of right artificial hip joint: Secondary | ICD-10-CM | POA: Diagnosis not present

## 2022-04-15 DIAGNOSIS — R000 Tachycardia, unspecified: Secondary | ICD-10-CM | POA: Diagnosis present

## 2022-04-15 DIAGNOSIS — M6259 Muscle wasting and atrophy, not elsewhere classified, multiple sites: Secondary | ICD-10-CM | POA: Diagnosis not present

## 2022-04-15 DIAGNOSIS — I421 Obstructive hypertrophic cardiomyopathy: Secondary | ICD-10-CM | POA: Diagnosis not present

## 2022-04-15 DIAGNOSIS — E785 Hyperlipidemia, unspecified: Secondary | ICD-10-CM | POA: Diagnosis not present

## 2022-04-15 DIAGNOSIS — R2689 Other abnormalities of gait and mobility: Secondary | ICD-10-CM | POA: Diagnosis not present

## 2022-04-15 DIAGNOSIS — Z7901 Long term (current) use of anticoagulants: Secondary | ICD-10-CM | POA: Diagnosis not present

## 2022-04-15 DIAGNOSIS — I10 Essential (primary) hypertension: Secondary | ICD-10-CM | POA: Diagnosis not present

## 2022-04-15 DIAGNOSIS — E871 Hypo-osmolality and hyponatremia: Secondary | ICD-10-CM | POA: Insufficient documentation

## 2022-04-15 DIAGNOSIS — I4891 Unspecified atrial fibrillation: Secondary | ICD-10-CM | POA: Diagnosis not present

## 2022-04-15 DIAGNOSIS — I499 Cardiac arrhythmia, unspecified: Secondary | ICD-10-CM | POA: Diagnosis not present

## 2022-04-15 DIAGNOSIS — R457 State of emotional shock and stress, unspecified: Secondary | ICD-10-CM | POA: Diagnosis not present

## 2022-04-15 LAB — BASIC METABOLIC PANEL
Anion gap: 12 (ref 5–15)
BUN: 7 mg/dL — ABNORMAL LOW (ref 8–23)
CO2: 23 mmol/L (ref 22–32)
Calcium: 8.6 mg/dL — ABNORMAL LOW (ref 8.9–10.3)
Chloride: 94 mmol/L — ABNORMAL LOW (ref 98–111)
Creatinine, Ser: 0.78 mg/dL (ref 0.61–1.24)
GFR, Estimated: >60 mL/min (ref >60)
Glucose, Bld: 114 mg/dL — ABNORMAL HIGH (ref 70–99)
Potassium: 4 mmol/L (ref 3.5–5.1)
Sodium: 129 mmol/L — ABNORMAL LOW (ref 135–145)

## 2022-04-15 LAB — CBC
HCT: 38.9 % — ABNORMAL LOW (ref 39.0–52.0)
Hemoglobin: 13.4 g/dL (ref 13.0–17.0)
MCH: 29.7 pg (ref 26.0–34.0)
MCHC: 34.4 g/dL (ref 30.0–36.0)
MCV: 86.3 fL (ref 80.0–100.0)
Platelets: 191 10*3/uL (ref 150–400)
RBC: 4.51 MIL/uL (ref 4.22–5.81)
RDW: 12.5 % (ref 11.5–15.5)
WBC: 7.9 10*3/uL (ref 4.0–10.5)
nRBC: 0 % (ref 0.0–0.2)

## 2022-04-15 LAB — MAGNESIUM: Magnesium: 2.2 mg/dL (ref 1.7–2.4)

## 2022-04-15 MED ORDER — SODIUM CHLORIDE 0.9 % IV BOLUS
1000.0000 mL | Freq: Once | INTRAVENOUS | Status: AC
  Administered 2022-04-16: 1000 mL via INTRAVENOUS

## 2022-04-15 NOTE — ED Triage Notes (Signed)
Patient Ronnie Black from [facility] for hip replacement after reporting afib to staff. Patient has hx of afib and recognized that he was in that rhythm b/c of feeling jittery. Patient takes medication for afib and is usually controlled. Currently patient is NSR, initial EKG showed a-flutter. Patient has no complaints except for hip pain d/t surgery.

## 2022-04-15 NOTE — Progress Notes (Unsigned)
Provider:   Location:  Occupational psychologist of Service:  SNF (31)  PCP: [name], MD Patient Care Team: [name], MD as PCP - General (Family Medicine) Vickie Epley, MD as PCP - Electrophysiology (Cardiology) Vickie Epley, MD as PCP - Cardiology (Cardiology) [name], MD (Inactive) (Cardiology) [facility], [facility] as Consulting Physician (Urology) Laurin Coder, MD as Consulting Physician (Pulmonary Disease) Richmond Campbell, MD as Consulting Physician (Gastroenterology) [name], MD as Consulting Physician (Urology)  Extended Emergency Contact Information Primary Emergency Contact: Koffler,Nicole Mobile Phone: [phone] Relation: Daughter  Code Status: Full CODE Goals of Care: Advanced Directive information    04/15/2022    9:29 AM  Advanced Directives  Does Patient Have a Medical Advance Directive? Yes  Type of Paramedic of [place];Living will  Copy of [place] in Chart? No - copy requested      Chief Complaint  Patient presents with   New Admit To SNF    Patient is being seen for Admission to SNF   Quality Metric Gaps    Discussed the need for SNF    HPI: Patient is a 77 y.o. male seen today for admission to Rehab for Therapy after undergoing Right Total Hip Arthroplasty on 04/12/22  Patient admitted from 3/22 to 3/23 for right hip arthroplasty Patient has a history of PAF, hypertension, HOCM,, HLD  He had a known history of right hip arthritis.  And had failed conservative management He was electively admitted and underwent right total hip arthroplasty by Dr. Ninfa Linden Postop care in rehab patient is doing well He is already walking with his walker.  Pain is controlled.  Denies any dizziness or any other issues Past Medical History:  Diagnosis Date   Complication of anesthesia    slow to awaken in past   Current use of long term  anticoagulation 11/06/2018   DDD (degenerative disc disease)    HLD (hyperlipidemia)    Hypertension    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 obstruction   Persistent atrial fibrillation ([place])    s/p PVI 3/12   Pneumonia    Prostate cancer ([place]) 05/08/2021   04/2021   Past Surgical History:  Procedure Laterality Date   APPENDECTOMY  1970   atrial fibrillation ablation  03/27/2010   PVI with CTI ablation by [place] N/A 11/21/2016   Procedure: [place];  Surgeon: [name], MD;  Location: [facility];  Service: Cardiovascular;  Laterality: N/A;   CARDIOVERSION     CARDIOVERSION  04/26/2011   Procedure: CARDIOVERSION;  Surgeon: Larey Dresser, MD;  Location: [place];  Service: Cardiovascular;  Laterality: N/A;   CARDIOVERSION N/A 04/28/2012   Procedure: CARDIOVERSION;  Surgeon: Thayer Headings, MD;  Location: [place];  Service: Cardiovascular;  Laterality: N/A;   CARDIOVERSION N/A 07/28/2012   Procedure: CARDIOVERSION;  Surgeon: Lelon Perla, MD;  Location: [facility] ENDOSCOPY;  Service: Cardiovascular;  Laterality: N/A;   CARDIOVERSION N/A 08/14/2012   Procedure: CARDIOVERSION;  Surgeon: Josue Hector, MD;  Location: [facility] ENDOSCOPY;  Service: Cardiovascular;  Laterality: N/A;   CARDIOVERSION N/A 08/11/2014   Procedure: CARDIOVERSION;  Surgeon: Fay Records, MD;  Location: [place];  Service: Cardiovascular;  Laterality: N/A;   CARDIOVERSION N/A 08/26/2014   Procedure: CARDIOVERSION;  Surgeon: Sanda Klein, MD;  Location: [facility] ENDOSCOPY;  Service: Cardiovascular;  Laterality: N/A;   CARDIOVERSION N/A 10/10/2016  Procedure: CARDIOVERSION;  Surgeon: Larey Dresser, MD;  Location: [facility] ENDOSCOPY;  Service: Cardiovascular;  Laterality: N/A;   CARDIOVERSION N/A 12/04/2016   Procedure: CARDIOVERSION;  Surgeon: Larey Dresser, MD;  Location: [facility] ENDOSCOPY;  Service: Cardiovascular;  Laterality: N/A;    CARDIOVERSION N/A 06/09/2018   Procedure: CARDIOVERSION;  Surgeon: Dorothy Spark, MD;  Location: [facility] ENDOSCOPY;  Service: Cardiovascular;  Laterality: N/A;   CARDIOVERSION N/A 08/17/2019   Procedure: CARDIOVERSION;  Surgeon: [name], MD;  Location: [place];  Service: Cardiovascular;  Laterality: N/A;   CARDIOVERSION N/A 11/18/2019   Procedure: CARDIOVERSION;  Surgeon: [name], MD;  [place];  Service: Cardiovascular;  Laterality: N/A;   CARDIOVERSION N/A 03/09/2020   Procedure: CARDIOVERSION;  Surgeon: [name], MD;  Location: [place];  Service: Cardiovascular;  Laterality: N/A;   RADIOACTIVE SEED IMPLANT N/A 08/06/2021   Procedure: RADIOACTIVE SEED IMPLANT/BRACHYTHERAPY IMPLANT;  Surgeon: [name], MD;  Location: [facility];  Service: Urology;  Laterality: N/A;   SKIN CANCER EXCISION     SPACE OAR INSTILLATION N/A 08/06/2021   Procedure: SPACE OAR INSTILLATION;  Surgeon: [name], MD;  Location[facility];  Service: Urology;  Laterality: N/A;   TEE WITHOUT CARDIOVERSION  04/26/2011   Procedure: TRANSESOPHAGEAL ECHOCARDIOGRAM (TEE);  Surgeon: Larey Dresser, MD;  Location: [facility] ENDOSCOPY;  Service: Cardiovascular;  Laterality: N/A;  to be done at 1330   TEE WITHOUT CARDIOVERSION N/A 04/28/2012   Procedure: TRANSESOPHAGEAL ECHOCARDIOGRAM (TEE);  Surgeon: Thayer Headings, MD;  Location: [place];  Service: Cardiovascular;  Laterality: N/A;   TOTAL HIP ARTHROPLASTY Right 04/12/2022   Procedure: RIGHT TOTAL HIP ARTHROPLASTY ANTERIOR APPROACH;  Surgeon: Mcarthur Rossetti, MD;  Location: [facility];  Service: Orthopedics;  Laterality: Right;   WISDOM TOOTH EXTRACTION     yrs ago    reports that he quit smoking about 46 years ago. His smoking use included cigarettes. He has a 7.50 pack-year smoking history. He has never used smokeless tobacco. He reports current alcohol use of about 6.0  standard drinks of alcohol per week. He reports that he does not use drugs. Social History   Socioeconomic History   Marital status: Widowed    Spouse name: Not on file   Number of children: 2   Years of education: Not on file   Highest education level: Not on file  Occupational History   Occupation: professor    Employer: [employer]     Comment: retired   Tobacco Use   Smoking status: Former    Packs/day: 0.50    Years: 15.00    Additional pack years: 0.00    Total pack years: 7.50    Types: Cigarettes    Quit date: 11/28/1975    Years since quitting: 46.4   Smokeless tobacco: Never  Vaping Use   Vaping Use: Never used  Substance and Sexual Activity   Alcohol use: Yes    Alcohol/week: 6.0 standard drinks of alcohol    Types: 3 Glasses of wine, 3 Cans of beer per week    Comment: social   Drug use: No   Sexual activity: Yes  Other Topics Concern   Not on file  Social History Narrative    a professor of Therapist, occupational at [employer] in [place] @ [place] Strain: [place]  (06/09/2020)   Overall Financial Resource Strain (CARDIA)  Difficulty of Paying Living Expenses: Not hard at all  Food Insecurity: No Food Insecurity (04/12/2022)   Hunger Vital Sign    Worried About Running Out of Food in the Last Year: Never true    Ran Out of Food in the Last Year: Never true  Transportation Needs: No Transportation Needs (04/12/2022)   PRAPARE - Hydrologist (Medical): No    Lack of Transportation (Non-Medical): No  Physical Activity: Sufficiently Active (06/09/2020)   Exercise Vital Sign    Days of Exercise per Week: 6 days    Minutes of Exercise per Session: 60 min  Stress: No Stress Concern Present (06/09/2020)   [place]    Feeling of Stress : Not at all  Social Connections: Moderately  Isolated (06/09/2020)   Social Connection and Isolation Panel [NHANES]    Frequency of Communication with Friends and Family: Twice a week    Frequency of Social Gatherings with Friends and Family: Three times a week    Attends Religious Services: Never    Active Member of Clubs or Organizations: No    Attends Archivist Meetings: Never    Marital Status: Married  Human resources officer Violence: Not At Risk (04/12/2022)   Humiliation, Afraid, Rape, and Kick questionnaire    Fear of Current or Ex-Partner: No    Emotionally Abused: No    Physically Abused: No    Sexually Abused: No    Functional Status Survey:    Family History  Problem Relation Age of Onset   Coronary artery disease Other    Colon cancer Mother    Lung cancer Mother    Colon cancer Father    Early death Neg Hx    Hearing loss Neg Hx    Heart disease Neg Hx    Hyperlipidemia Neg Hx    Hypertension Neg Hx    Kidney disease Neg Hx    Stroke Neg Hx     Health Maintenance  Topic Date Due   Medicare Annual Wellness (AWV)  06/09/2021   COLONOSCOPY (Pts 45-37yrs Insurance coverage will need to be confirmed)  05/07/2022   COVID-19 Vaccine (6 - 2023-24 season) 05/31/2022   DTaP/Tdap/Td (2 - Td or Tdap) 11/08/2026   Pneumonia Vaccine 88+ Years old  Completed   INFLUENZA VACCINE  Completed   Hepatitis C Screening  Completed   Zoster Vaccines- Shingrix  Completed   HPV VACCINES  Aged Out    No Known Allergies  Outpatient Encounter Medications as of 04/15/2022  Medication Sig   atorvastatin (LIPITOR) 10 MG tablet TAKE 1 TABLET BY MOUTH EVERY DAY   dofetilide (TIKOSYN) 500 MCG capsule TAKE 1 CAPSULE BY MOUTH TWICE A DAY   ELIQUIS 5 MG TABS tablet TAKE 1 TABLET BY MOUTH TWICE A DAY   fluticasone (CUTIVATE) 0.005 % ointment Apply 1 Application topically as needed.   HYDROcodone-acetaminophen (NORCO/VICODIN) 5-325 MG tablet Take 1-2 tablets by mouth every 4 (four) hours as needed for moderate pain (pain score  4-6).   losartan (COZAAR) 25 MG tablet TAKE 1 TABLET (25 MG TOTAL) BY MOUTH DAILY.   melatonin 5 MG TABS Take 5 mg by mouth at bedtime.   methocarbamol (ROBAXIN) 500 MG tablet Take 1 tablet (500 mg total) by mouth every 6 (six) hours as needed for muscle spasms.   metoprolol tartrate (LOPRESSOR) 25 MG tablet TAKE 1 TABLET BY MOUTH TWICE A DAY   Multiple Vitamin (MULTIVITAMIN WITH  MINERALS) TABS tablet Take 1 tablet by mouth 4 (four) times a week.   sodium chloride (OCEAN) 0.65 % SOLN nasal spray Place 1 spray into both nostrils as needed (dryness).   tamsulosin (FLOMAX) 0.4 MG CAPS capsule Take 1 capsule (0.4 mg total) by mouth daily.   No facility-administered encounter medications on file as of 04/15/2022.    Review of Systems  Constitutional:  Negative for activity change, appetite change and unexpected weight change.  HENT: Negative.    Respiratory:  Negative for cough and shortness of breath.   Cardiovascular:  Negative for leg swelling.  Gastrointestinal:  Negative for constipation.  Genitourinary:  Negative for frequency.  Musculoskeletal:  Negative for arthralgias, gait problem and myalgias.  Skin: Negative.  Negative for rash.  Neurological:  Negative for dizziness and weakness.  Psychiatric/Behavioral:  Negative for confusion and sleep disturbance.   All other systems reviewed and are negative.   Vitals:   04/15/22 0925  BP: (!) 97/58  Pulse: 94  Resp: 16  Temp: 97.9 F (36.6 C)  TempSrc: Temporal  SpO2: 96%  Height: 5\' 6"  (1.676 m)   Body mass index is 24.69 kg/m. Physical Exam Vitals reviewed.  Constitutional:      Appearance: Normal appearance.  HENT:     Head: Normocephalic.     Nose: Nose normal.     Mouth/Throat:     Mouth: Mucous membranes are moist.     Pharynx: Oropharynx is clear.  Eyes:     Pupils: Pupils are equal, round, and reactive to light.  Cardiovascular:     Rate and Rhythm: Normal rate and regular rhythm.     Pulses: Normal pulses.      Heart sounds: No murmur heard. Pulmonary:     Effort: Pulmonary effort is normal. No respiratory distress.     Breath sounds: Normal breath sounds. No rales.  Abdominal:     General: Abdomen is flat. Bowel sounds are normal.     Palpations: Abdomen is soft.  Musculoskeletal:        General: No swelling.     Cervical back: Neck supple.  Skin:    General: Skin is warm.  Neurological:     General: No focal deficit present.     Mental Status: He is alert and oriented to person, place, and time.  Psychiatric:        Mood and Affect: Mood normal.        Thought Content: Thought content normal.     Labs reviewed: Basic Metabolic Panel: Recent Labs    05/15/21 1444 08/06/21 1202 09/18/21 1526 09/27/21 1429 04/01/22 1358 04/03/22 1406 04/13/22 0335  NA 136   < > 139   < > 138 136 138  K 3.9   < > 4.1   < > 4.2 3.5 4.7  CL 100   < > 103   < > 101 100 102  CO2 28  --  29   < > 21* 25 28  GLUCOSE 93   < > 90   < > 95 90 129*  BUN 11   < > 14   < > 11 17 11   CREATININE 0.83   < > 0.85   < > 0.87 0.88 0.78  CALCIUM 9.4  --  9.4   < > 9.4 8.9 9.0  MG 2.2  --  2.2  --  2.2  --   --    < > = values in this interval not displayed.  Liver Function Tests: Recent Labs    12/06/21 1007 04/03/22 1406  AST 22 24  ALT 31 25  ALKPHOS 57 51  BILITOT 0.8 0.7  PROT 6.4 6.3*  ALBUMIN 4.4 4.3   No results for input(s): "LIPASE", "AMYLASE" in the last 8760 hours. No results for input(s): "AMMONIA" in the last 8760 hours. CBC: Recent Labs    12/06/21 1007 04/03/22 1406 04/13/22 0335  WBC 4.6 5.4 11.6*  NEUTROABS 2.3  --   --   HGB 15.5 15.0 14.1  HCT 46.1 44.6 42.7  MCV 87.0 88.1 88.2  PLT 196.0 196 192   Cardiac Enzymes: No results for input(s): "CKTOTAL", "CKMB", "CKMBINDEX", "TROPONINI" in the last 8760 hours. BNP: Invalid input(s): "POCBNP" No results found for: "HGBA1C" Lab Results  Component Value Date   TSH 4.07 12/06/2021   No results found for:  "VITAMINB12" No results found for: "FOLATE" No results found for: "IRON", "TIBC", "FERRITIN"  Imaging and Procedures obtained prior to SNF admission: DG Pelvis Portable  Result Date: 04/12/2022 CLINICAL DATA:  Status post total right hip replacement EXAM: PORTABLE PELVIS 1-2 VIEWS COMPARISON:  04/24/2021 CT abdomen pelvis FINDINGS: Status post right total hip replacement. No periprosthetic lucency or fracture. Near anatomic alignment of the prosthetic components. Air within the soft tissues is not unexpected postoperatively. Superficial skin staples. Redemonstrated severe degenerative changes in the left hip. Brachytherapy seeds overlie the prostate. IMPRESSION: Expected postoperative appearance, status post right total hip replacement. Electronically Signed   By: Merilyn Baba M.D.   On: 04/12/2022 12:16   DG HIP UNILAT WITH PELVIS 1V RIGHT  Result Date: 04/12/2022 CLINICAL DATA:  Intraoperative right hip replacement. EXAM: DG HIP (WITH OR WITHOUT PELVIS) 1V RIGHT COMPARISON:  Pelvis and bilateral hip radiographs 12/01/2020 FINDINGS: Images were performed intraoperatively without the presence of a radiologist. Interval total right hip arthroplasty. No hardware complication is seen. Brachytherapy seeds overlie the prostate bed. Severe left femoroacetabular joint space narrowing and peripheral osteophytosis. Total fluoroscopy images: 4 Total fluoroscopy time: 18 seconds Total dose: Radiation Exposure Index (as provided by the fluoroscopic device): 1.8 mGy air Kerma Please see intraoperative findings for further detail. IMPRESSION: Intraoperative images during total right hip arthroplasty. Electronically Signed   By: Yvonne Kendall M.D.   On: 04/12/2022 10:56   DG C-Arm 1-60 Min-No Report  Result Date: 04/12/2022 Fluoroscopy was utilized by the requesting physician.  No radiographic interpretation.    Assessment/Plan 1. Status post total replacement of right hip WBAT Pain Control On Eliquis  already Walking with his walker Working with therapy   2. HOCM (hypertrophic obstructive cardiomyopathy) (HCC) Non Obstructive  3. PAF (paroxysmal atrial fibrillation) (HCC) Tikosyn and Eliquis and Metoprolol  4. Essential hypertension Was low On Losartan Will Continue to follow If BP is still low will cut back the dose  5. Hyperlipidemia, unspecified hyperlipidemia type Statin  6. Benign prostatic hyperplasia without lower urinary tract symptoms Flomax    Family/ staff Communication:   Labs/tests ordered:

## 2022-04-15 NOTE — ED Provider Notes (Signed)
[facility] Provider Note   CSN: [id number] Arrival date & time: 04/15/22  2101     History {Add pertinent medical, surgical, social history, OB history to HPI:1} Chief Complaint  Patient presents with   Tachycardia    Ronnie Black is a 77 y.o. male with a history of paroxysmal A-fib, on Tikosyn and Eliquis, presenting to the ED from [facility] rehab with concern for palpitations.  The patient reports that he was at rest today, currently in rehab now postop day 3 from a hip replacement, reports he began to feel palpitations and he was back in A-fib.  He said he used to have a history of significant episodes of recurring A-fib, has had cardio versions as well as cardiac ablations, but has been doing quite well on his Tikosyn regimen with no further issues in a long time.  Today he felt himself go back into A-fib.  EMS reported a heart rate of 130 and a flutter type pattern en route to the hospital.  The patient reports as arriving in the hospital he feels much [place], better, back to baseline  HPI     Home Medications Prior to Admission medications   Medication Sig Start Date End Date Taking? Authorizing Provider  atorvastatin (LIPITOR) 10 MG tablet TAKE 1 TABLET BY MOUTH EVERY DAY 02/08/22   Vickie Epley, MD  dofetilide (TIKOSYN) 500 MCG capsule TAKE 1 CAPSULE BY MOUTH TWICE A DAY 11/14/21   Sherran Needs, NP  ELIQUIS 5 MG TABS tablet TAKE 1 TABLET BY MOUTH TWICE A DAY 11/16/21   Vickie Epley, MD  fluticasone (CUTIVATE) 0.005 % ointment Apply 1 Application topically as needed. 12/06/21   [name], MD  HYDROcodone-acetaminophen (NORCO/VICODIN) 5-325 MG tablet Take 1-2 tablets by mouth every 4 (four) hours as needed for moderate pain (pain score 4-6). 04/13/22   Pete Pelt, PA-C  losartan (COZAAR) 25 MG tablet TAKE 1 TABLET (25 MG TOTAL) BY MOUTH DAILY. 02/08/22 02/08/23  Sherran Needs, NP  melatonin 5 MG TABS  Take 5 mg by mouth at bedtime.    [provider]  methocarbamol (ROBAXIN) 500 MG tablet Take 1 tablet (500 mg total) by mouth every 6 (six) hours as needed for muscle spasms. 04/13/22   Pete Pelt, PA-C  metoprolol tartrate (LOPRESSOR) 25 MG tablet TAKE 1 TABLET BY MOUTH TWICE A DAY 02/08/22   Sherran Needs, NP  Multiple Vitamin (MULTIVITAMIN WITH MINERALS) TABS tablet Take 1 tablet by mouth 4 (four) times a week.    [provider]  sodium chloride (OCEAN) 0.65 % SOLN nasal spray Place 1 spray into both nostrils as needed (dryness).    [provider]  tamsulosin (FLOMAX) 0.4 MG CAPS capsule Take 1 capsule (0.4 mg total) by mouth daily. 10/31/21   Karen Kays, NP      Allergies    Patient has no known allergies.    Review of Systems   Review of Systems  Physical Exam Updated Vital Signs BP 130/73 (BP Location: Right Arm)   Pulse 93   Temp 98.1 F (36.7 C) (Oral)   Resp 16   SpO2 96% Comment: Simultaneous filing. User may not have seen previous data. Physical Exam Constitutional:      General: He is not in acute distress. HENT:     Head: Normocephalic and atraumatic.  Eyes:     Conjunctiva/sclera: Conjunctivae normal.     Pupils: Pupils  are equal, round, and reactive to light.  Cardiovascular:     Rate and Rhythm: Normal rate and regular rhythm.  Pulmonary:     Effort: Pulmonary effort is normal. No respiratory distress.  Abdominal:     General: There is no distension.     Tenderness: There is no abdominal tenderness.  Skin:    General: Skin is warm and dry.  Neurological:     General: No focal deficit present.     Mental Status: He is alert and oriented to person, place, and time. Mental status is at baseline.  Psychiatric:        Mood and Affect: Mood normal.        Behavior: Behavior normal.     ED Results / Procedures / Treatments   Labs (all labs ordered are listed, but only abnormal results are displayed) Labs Reviewed  - No data to display  EKG EKG Interpretation  Date/Time:  Monday April 15 2022 21:24:04 EDT Ventricular Rate:  92 PR Interval:  180 QRS Duration: 104 QT Interval:  371 QTC Calculation: 459 R Axis:   41 Text Interpretation: Sinus rhythm Atrial premature complex Consider left atrial enlargement RSR' in V1 or V2, right VCD or RVH  No sig change from April 01 2022 tracing, T wave inversions lateral leads and inferior leads seen on prior tracing Confirmed by Octaviano Glow [phone]) on 04/15/2022 9:46:02 PM  Radiology No results found.  Procedures Procedures  {Document cardiac monitor, telemetry assessment procedure when appropriate:1}  Medications Ordered in ED Medications - No data to display  ED Course/ Medical Decision Making/ A&P   {   Click here for ABCD2, HEART and other calculatorsREFRESH Note before signing :1}                          Medical Decision Making Amount and/or Complexity of Data Reviewed Labs: ordered.   This patient presents to the ED with concern for palpitations. This involves an extensive number of treatment options, and is a complaint that carries with it a high risk of complications and morbidity.  The differential diagnosis includes A-fib versus a flutter for his PVCs versus other  Co-morbidities that complicate the patient evaluation: History of arrhythmias at high risk of recurring exacerbation  Additional history obtained from EMS  External records from outside source obtained and reviewed including cardiology evaluations as an outpatient, reporting a history of a flutter and ablations in the past.  He has been attempted on amiodarone which was stopped in the past.  I ordered and personally interpreted labs.  The pertinent results include:  ***  Patient is not having any symptoms of pneumonia or concerning findings for intrathoracic emergency.  I do not see indication for x-ray imaging of the chest at this time.  He does not have any signs or  symptoms of pulmonary embolism.  The patient was maintained on a cardiac monitor.  I personally viewed and interpreted the cardiac monitored which showed an underlying rhythm of: Sinus rhythm with heart rate in the 90s, frequent PVCs  Per my interpretation the patient's ECG shows sinus rhythm  I ordered medication including ***  for ***  I have reviewed the patients home medicines and have made adjustments as needed  Test Considered: ***  I requested consultation with the ***,  and discussed lab and imaging findings as well as pertinent plan - they recommend: ***  After the interventions noted above, I reevaluated the patient  and found that they have: {resolved/improved/worsened:23923::"improved"}  Social Determinants of Health:***  Dispostion:  After consideration of the diagnostic results and the patients response to treatment, I feel that the patent would benefit from ***.   {Document critical care time when appropriate:1} {Document review of labs and clinical decision tools ie heart score, Chads2Vasc2 etc:1}  {Document your independent review of radiology images, and any outside records:1} {Document your discussion with family members, caretakers, and with consultants:1} {Document social determinants of health affecting pt's care:1} {Document your decision making why or why not admission, treatments were needed:1} Final Clinical Impression(s) / ED Diagnoses Final diagnoses:  None    Rx / DC Orders ED Discharge Orders     None

## 2022-04-15 NOTE — Telephone Encounter (Signed)
Ortho bundle D/C call completed; no current needs. Living in [place] [facility] for STR at this moment and hopes to return later in the week.

## 2022-04-16 DIAGNOSIS — M6259 Muscle wasting and atrophy, not elsewhere classified, multiple sites: Secondary | ICD-10-CM | POA: Diagnosis not present

## 2022-04-16 DIAGNOSIS — R531 Weakness: Secondary | ICD-10-CM | POA: Diagnosis not present

## 2022-04-16 DIAGNOSIS — Z743 Need for continuous supervision: Secondary | ICD-10-CM | POA: Diagnosis not present

## 2022-04-16 DIAGNOSIS — Z96641 Presence of right artificial hip joint: Secondary | ICD-10-CM | POA: Diagnosis not present

## 2022-04-16 DIAGNOSIS — R000 Tachycardia, unspecified: Secondary | ICD-10-CM | POA: Diagnosis not present

## 2022-04-16 DIAGNOSIS — R2689 Other abnormalities of gait and mobility: Secondary | ICD-10-CM | POA: Diagnosis not present

## 2022-04-16 NOTE — Discharge Instructions (Signed)
Ronnie Black stayed in sinus rhythm during his stay in the emergency department.  He may have had an episode of paroxysmal episodic A-fib or a flutter.  His blood test were reassuring but his sodium level was lower today, with a sodium of 129.  This may be related to his recent surgery.  He was given IV saline solution in the ER.  He can follow-up with the cardiologist who manages his atrial fibrillation or a flutter.  In the meantime he can stay on his typical medications.  He should have his sodium level rechecked in the next 1 to 2 days.

## 2022-04-16 NOTE — ED Notes (Signed)
RN called facility to give report and did not get an answer.Marland KitchenMarland KitchenMarland Kitchen

## 2022-04-17 DIAGNOSIS — M6259 Muscle wasting and atrophy, not elsewhere classified, multiple sites: Secondary | ICD-10-CM | POA: Diagnosis not present

## 2022-04-17 DIAGNOSIS — E119 Type 2 diabetes mellitus without complications: Secondary | ICD-10-CM | POA: Diagnosis not present

## 2022-04-17 DIAGNOSIS — R2689 Other abnormalities of gait and mobility: Secondary | ICD-10-CM | POA: Diagnosis not present

## 2022-04-17 DIAGNOSIS — Z96641 Presence of right artificial hip joint: Secondary | ICD-10-CM | POA: Diagnosis not present

## 2022-04-18 ENCOUNTER — Non-Acute Institutional Stay (SKILLED_NURSING_FACILITY): Payer: Medicare PPO | Admitting: Adult Health

## 2022-04-18 ENCOUNTER — Other Ambulatory Visit (HOSPITAL_COMMUNITY): Payer: Self-pay

## 2022-04-18 ENCOUNTER — Encounter: Payer: Self-pay | Admitting: Adult Health

## 2022-04-18 ENCOUNTER — Telehealth: Payer: Self-pay

## 2022-04-18 ENCOUNTER — Other Ambulatory Visit: Payer: Self-pay

## 2022-04-18 ENCOUNTER — Other Ambulatory Visit (HOSPITAL_BASED_OUTPATIENT_CLINIC_OR_DEPARTMENT_OTHER): Payer: Self-pay

## 2022-04-18 DIAGNOSIS — I10 Essential (primary) hypertension: Secondary | ICD-10-CM

## 2022-04-18 DIAGNOSIS — E782 Mixed hyperlipidemia: Secondary | ICD-10-CM | POA: Diagnosis not present

## 2022-04-18 DIAGNOSIS — E871 Hypo-osmolality and hyponatremia: Secondary | ICD-10-CM | POA: Diagnosis not present

## 2022-04-18 DIAGNOSIS — Z96641 Presence of right artificial hip joint: Secondary | ICD-10-CM

## 2022-04-18 DIAGNOSIS — M1611 Unilateral primary osteoarthritis, right hip: Secondary | ICD-10-CM | POA: Diagnosis not present

## 2022-04-18 DIAGNOSIS — I4819 Other persistent atrial fibrillation: Secondary | ICD-10-CM | POA: Diagnosis not present

## 2022-04-18 DIAGNOSIS — M6259 Muscle wasting and atrophy, not elsewhere classified, multiple sites: Secondary | ICD-10-CM | POA: Diagnosis not present

## 2022-04-18 DIAGNOSIS — R2689 Other abnormalities of gait and mobility: Secondary | ICD-10-CM | POA: Diagnosis not present

## 2022-04-18 LAB — BASIC METABOLIC PANEL
BUN: 11 (ref 4–21)
CO2: 27 — AB (ref 13–22)
Chloride: 99 (ref 99–108)
Creatinine: 0.7 (ref 0.6–1.3)
Glucose: 83
Potassium: 4.9 mEq/L (ref 3.5–5.1)
Sodium: 137 (ref 137–147)

## 2022-04-18 LAB — COMPREHENSIVE METABOLIC PANEL
Calcium: 8.9 (ref 8.7–10.7)
eGFR: >90

## 2022-04-18 MED FILL — Metoprolol Tartrate Tab 25 MG: ORAL | 90 days supply | Qty: 180 | Fill #0 | Status: CN

## 2022-04-18 MED FILL — Metoprolol Tartrate Tab 25 MG: ORAL | 90 days supply | Qty: 180 | Fill #0 | Status: AC

## 2022-04-18 NOTE — Progress Notes (Signed)
Location:  Occupational psychologist of Service:  SNF (31)  Provider:  Cindi Carbon, ANP [place] [phone]   PCP: [name], MD Patient Care Team: [name], MD as PCP - General (Family Medicine) Vickie Epley, MD as PCP - Electrophysiology (Cardiology) Vickie Epley, MD as PCP - Cardiology (Cardiology) [name], MD (Inactive) (Cardiology) Pa, [facility] as Consulting Physician (Urology) Laurin Coder, MD as Consulting Physician (Pulmonary Disease) Richmond Campbell, MD as Consulting Physician (Gastroenterology) [name], MD as Consulting Physician (Urology)  Extended Emergency Contact Information Primary Emergency Contact: Sportsman,Nicole Mobile Phone: [phone] Relation: Daughter   Goals of care:  Advanced Directive information    04/18/2022    1:20 PM  Advanced Directives  Does Patient Have a Medical Advance Directive? Yes  Type of Paramedic of [place];Living will  Does patient want to make changes to medical advance directive? No - Patient declined     No Known Allergies  Chief Complaint  Patient presents with   Discharge Note    HPI:  77 y.o. male seen for discharge from [place] skilled rehab back to [place]  Had primary OA end stage right hip and underwent a right total hip replacement on 04/12/22. He came to skilled rehab for therapy and did quite well. He is ambulating well. Has mild to moderate pain mostly at night with relief with norco. Bowels are moving, ready for discharge  He did go to the ER for palpitations on 04/15/22 and has a hx of afib, cardioversion, ablation, and on tikosyn. EMS reported flutter with a rate of 130. In the ED his EKG improved went back into SR with HR 92.  NA was 129. He was given IVF. Repeat NA 137 04/18/22.   No further palpitations.     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   Persistent atrial fibrillation ([place])    s/p PVI 3/12   Pneumonia    Prostate cancer ([place]) 05/08/2021   04/2021    Past Surgical History:  Procedure Laterality Date   APPENDECTOMY  1970   atrial fibrillation ablation  03/27/2010   PVI with CTI ablation by [place] N/A 11/21/2016   Procedure: [place];  Surgeon: [name], MD;  Location: [place] [facility];  Service: Cardiovascular;  Laterality: N/A;   CARDIOVERSION     CARDIOVERSION  04/26/2011   Procedure: CARDIOVERSION;  Surgeon: Larey Dresser, MD;  Location: [place];  Service: Cardiovascular;  Laterality: N/A;   CARDIOVERSION N/A 04/28/2012   Procedure: CARDIOVERSION;  Surgeon: Thayer Headings, MD;  Location: [place];  Service: Cardiovascular;  Laterality: N/A;   CARDIOVERSION N/A 07/28/2012   Procedure: CARDIOVERSION;  Surgeon: Lelon Perla, MD;  Location: [facility] ENDOSCOPY;  Service: Cardiovascular;  Laterality: N/A;   CARDIOVERSION N/A 08/14/2012   Procedure: CARDIOVERSION;  Surgeon: Josue Hector, MD;  Location: [facility] ENDOSCOPY;  Service: Cardiovascular;  Laterality: N/A;   CARDIOVERSION N/A 08/11/2014   Procedure: CARDIOVERSION;  Surgeon: Fay Records, MD;  Location: [facility] ENDOSCOPY;  Service: Cardiovascular;  Laterality: N/A;   CARDIOVERSION N/A 08/26/2014   Procedure: CARDIOVERSION;  Surgeon: Dani Gobble  Croitoru, MD;  Location: [place];  Service: Cardiovascular;  Laterality: N/A;   CARDIOVERSION N/A 10/10/2016   Procedure: CARDIOVERSION;  Surgeon: Larey Dresser, MD;  Location: [facility] ENDOSCOPY;  Service: Cardiovascular;  Laterality: N/A;   CARDIOVERSION N/A 12/04/2016   Procedure: CARDIOVERSION;  Surgeon: Larey Dresser, MD;  Location: [facility] ENDOSCOPY;   Service: Cardiovascular;  Laterality: N/A;   CARDIOVERSION N/A 06/09/2018   Procedure: CARDIOVERSION;  Surgeon: Dorothy Spark, MD;  Location: [facility] ENDOSCOPY;  Service: Cardiovascular;  Laterality: N/A;   CARDIOVERSION N/A 08/17/2019   Procedure: CARDIOVERSION;  Surgeon: [name], MD;  Location: [place];  Service: Cardiovascular;  Laterality: N/A;   CARDIOVERSION N/A 11/18/2019   Procedure: CARDIOVERSION;  Surgeon: [name], MD;  Location: [place];  Service: Cardiovascular;  Laterality: N/A;   CARDIOVERSION N/A 03/09/2020   Procedure: CARDIOVERSION;  Surgeon: [name], MD;  Location: [place];  Service: Cardiovascular;  Laterality: N/A;   RADIOACTIVE SEED IMPLANT N/A 08/06/2021   Procedure: RADIOACTIVE SEED IMPLANT/BRACHYTHERAPY IMPLANT;  Surgeon: [name], MD;  Location: [facility];  Service: Urology;  Laterality: N/A;   SKIN CANCER EXCISION     SPACE OAR INSTILLATION N/A 08/06/2021   Procedure: SPACE OAR INSTILLATION;  Surgeon: [name], MD;  Location: [facility];  Service: Urology;  Laterality: N/A;   TEE WITHOUT CARDIOVERSION  04/26/2011   Procedure: TRANSESOPHAGEAL ECHOCARDIOGRAM (TEE);  Surgeon: Larey Dresser, MD;  Location: [facility] ENDOSCOPY;  Service: Cardiovascular;  Laterality: N/A;  to be done at 1330   TEE WITHOUT CARDIOVERSION N/A 04/28/2012   Procedure: TRANSESOPHAGEAL ECHOCARDIOGRAM (TEE);  Surgeon: Thayer Headings, MD;  Location: [place];  Service: Cardiovascular;  Laterality: N/A;   TOTAL HIP ARTHROPLASTY Right 04/12/2022   Procedure: RIGHT TOTAL HIP ARTHROPLASTY ANTERIOR APPROACH;  Surgeon: Mcarthur Rossetti, MD;  Location: [facility];  Service: Orthopedics;  Laterality: Right;   WISDOM TOOTH EXTRACTION     yrs ago      reports that he quit smoking about 46 years ago. His smoking use included cigarettes. He has a 7.50 pack-year smoking history. He has never used smokeless tobacco.  He reports current alcohol use of about 6.0 standard drinks of alcohol per week. He reports that he does not use drugs. Social History   Socioeconomic History   Marital status: Widowed    Spouse name: Not on file   Number of children: 2   Years of education: Not on file   Highest education level: Not on file  Occupational History   Occupation: professor    Employer: [employer]    Comment: retired   Tobacco Use   Smoking status: Former    Packs/day: 0.50    Years: 15.00    Additional pack years: 0.00    Total pack years: 7.50    Types: Cigarettes    Quit date: 11/28/1975    Years since quitting: 46.4   Smokeless tobacco: Never  Vaping Use   Vaping Use: Never used  Substance and Sexual Activity   Alcohol use: Yes    Alcohol/week: 6.0 standard drinks of alcohol    Types: 3 Glasses of wine, 3 Cans of beer per week    Comment: social   Drug use: No   Sexual activity: Yes  Other Topics Concern   Not on file  Social History Narrative    a professor of Therapist, occupational at [employer] in [place] @ [place]  Determinants of Health   Financial Resource Strain: Low Risk  (06/09/2020)   Overall Financial Resource Strain (CARDIA)    Difficulty of Paying Living Expenses: Not hard at all  Food Insecurity: No Food Insecurity (04/12/2022)   Hunger Vital Sign    Worried About Running Out of Food in the Last Year: Never true    Ran Out of Food in the Last Year: Never true  Transportation Needs: No Transportation Needs (04/12/2022)   PRAPARE - Hydrologist (Medical): No    Lack of Transportation (Non-Medical): No  Physical Activity: Sufficiently Active (06/09/2020)   Exercise Vital Sign    Days of Exercise per Week: 6 days    Minutes of Exercise per Session: 60 min  Stress: No Stress Concern Present (06/09/2020)   [place]    Feeling of Stress : Not  at all  Social Connections: Moderately Isolated (06/09/2020)   Social Connection and Isolation Panel [NHANES]    Frequency of Communication with Friends and Family: Twice a week    Frequency of Social Gatherings with Friends and Family: Three times a week    Attends Religious Services: Never    Active Member of Clubs or Organizations: No    Attends Archivist Meetings: Never    Marital Status: Married  Human resources officer Violence: Not At Risk (04/12/2022)   Humiliation, Afraid, Rape, and Kick questionnaire    Fear of Current or Ex-Partner: No    Emotionally Abused: No    Physically Abused: No    Sexually Abused: No   Functional Status Survey:    No Known Allergies  Pertinent  Health Maintenance Due  Topic Date Due   COLONOSCOPY (Pts 45-60yrs Insurance coverage will need to be confirmed)  05/07/2022   INFLUENZA VACCINE  Completed    Medications: Outpatient Encounter Medications as of 04/18/2022  Medication Sig   atorvastatin (LIPITOR) 10 MG tablet TAKE 1 TABLET BY MOUTH EVERY DAY   dofetilide (TIKOSYN) 500 MCG capsule TAKE 1 CAPSULE BY MOUTH TWICE A DAY   ELIQUIS 5 MG TABS tablet TAKE 1 TABLET BY MOUTH TWICE A DAY   fluticasone (CUTIVATE) 0.005 % ointment Apply 1 Application topically as needed.   HYDROcodone-acetaminophen (NORCO/VICODIN) 5-325 MG tablet Take 1-2 tablets by mouth every 4 (four) hours as needed for moderate pain (pain score 4-6).   losartan (COZAAR) 25 MG tablet TAKE 1 TABLET (25 MG TOTAL) BY MOUTH DAILY.   melatonin 5 MG TABS Take 5 mg by mouth at bedtime.   methocarbamol (ROBAXIN) 500 MG tablet Take 1 tablet (500 mg total) by mouth every 6 (six) hours as needed for muscle spasms.   metoprolol tartrate (LOPRESSOR) 25 MG tablet TAKE 1 TABLET BY MOUTH TWICE A DAY   Multiple Vitamin (MULTIVITAMIN WITH MINERALS) TABS tablet Take 1 tablet by mouth 4 (four) times a week.   sodium chloride (OCEAN) 0.65 % SOLN nasal spray Place 1 spray into both nostrils as needed  (dryness).   tamsulosin (FLOMAX) 0.4 MG CAPS capsule Take 1 capsule (0.4 mg total) by mouth daily.   No facility-administered encounter medications on file as of 04/18/2022.    Review of Systems  Constitutional:  Positive for activity change. Negative for appetite change, chills, diaphoresis, fatigue, fever and unexpected weight change.  Respiratory:  Negative for cough, shortness of breath, wheezing and stridor.   Cardiovascular:  Positive for leg swelling. Negative for chest pain and palpitations.  Gastrointestinal:  Negative for abdominal distention, abdominal pain, constipation and diarrhea.  Genitourinary:  Negative for difficulty urinating and dysuria.  Musculoskeletal:  Positive for gait problem. Negative for arthralgias, back pain, joint swelling and myalgias.  Neurological:  Negative for dizziness, seizures, syncope, facial asymmetry, speech difficulty, weakness and headaches.  Hematological:  Negative for adenopathy. Does not bruise/bleed easily.  Psychiatric/Behavioral:  Negative for agitation, behavioral problems and confusion.     Vitals:   04/18/22 1313  BP: 128/75  Pulse: 81  Resp: 18  Temp: 98 F (36.7 C)  TempSrc: Temporal  SpO2: 98%  Weight: 153 lb 6.4 oz (69.6 kg)  Height: 5\' 6"  (1.676 m)   Body mass index is 24.76 kg/m. Physical Exam Vitals and nursing note reviewed.  Constitutional:      General: He is not in acute distress.    Appearance: He is not diaphoretic.  HENT:     Head: Normocephalic and atraumatic.  Neck:     Thyroid: No thyromegaly.     Vascular: No JVD.     Trachea: No tracheal deviation.  Cardiovascular:     Rate and Rhythm: Normal rate and regular rhythm.     Heart sounds: No murmur heard. Pulmonary:     Effort: Pulmonary effort is normal. No respiratory distress.     Breath sounds: Rales present. No wheezing.  Abdominal:     General: Bowel sounds are normal. There is no distension.     Palpations: Abdomen is soft.     Tenderness:  There is no abdominal tenderness.  Musculoskeletal:     Comments: RLE +1 LLE none.  No calf tenderness or swelling.   Lymphadenopathy:     Cervical: No cervical adenopathy.  Skin:    General: Skin is warm and dry.     Comments: Right hip with mild surrounding swelling, no redness or warmth. Dressing with small amt of blood.   Neurological:     Mental Status: He is alert and oriented to person, place, and time.     Cranial Nerves: No cranial nerve deficit.     Labs reviewed: Basic Metabolic Panel: Recent Labs    09/18/21 1526 09/27/21 1429 04/01/22 1358 04/03/22 1406 04/13/22 0335 04/15/22 2215 04/18/22 0000  NA 139   < > 138 136 138 129* 137  K 4.1   < > 4.2 3.5 4.7 4.0 4.9  CL 103   < > 101 100 102 94* 99  CO2 29   < > 21* 25 28 23  27*  GLUCOSE 90   < > 95 90 129* 114*  --   BUN 14   < > 11 17 11  7* 11  CREATININE 0.85   < > 0.87 0.88 0.78 0.78 0.7  CALCIUM 9.4   < > 9.4 8.9 9.0 8.6* 8.9  MG 2.2  --  2.2  --   --  2.2  --    < > = values in this interval not displayed.   Liver Function Tests: Recent Labs    12/06/21 1007 04/03/22 1406  AST 22 24  ALT 31 25  ALKPHOS 57 51  BILITOT 0.8 0.7  PROT 6.4 6.3*  ALBUMIN 4.4 4.3   No results for input(s): "LIPASE", "AMYLASE" in the last 8760 hours. No results for input(s): "AMMONIA" in the last 8760 hours. CBC: Recent Labs    12/06/21 1007 04/03/22 1406 04/13/22 0335 04/15/22 2215  WBC 4.6 5.4 11.6* 7.9  NEUTROABS 2.3  --   --   --  HGB 15.5 15.0 14.1 13.4  HCT 46.1 44.6 42.7 38.9*  MCV 87.0 88.1 88.2 86.3  PLT 196.0 196 192 191   Cardiac Enzymes: No results for input(s): "CKTOTAL", "CKMB", "CKMBINDEX", "TROPONINI" in the last 8760 hours. BNP: Invalid input(s): "POCBNP" CBG: No results for input(s): "GLUCAP" in the last 8760 hours.  Procedures and Imaging Studies During Stay: DG Pelvis Portable  Result Date: 04/12/2022 CLINICAL DATA:  Status post total right hip replacement EXAM: PORTABLE PELVIS 1-2  VIEWS COMPARISON:  04/24/2021 CT abdomen pelvis FINDINGS: Status post right total hip replacement. No periprosthetic lucency or fracture. Near anatomic alignment of the prosthetic components. Air within the soft tissues is not unexpected postoperatively. Superficial skin staples. Redemonstrated severe degenerative changes in the left hip. Brachytherapy seeds overlie the prostate. IMPRESSION: Expected postoperative appearance, status post right total hip replacement. Electronically Signed   By: Merilyn Baba M.D.   On: 04/12/2022 12:16   DG HIP UNILAT WITH PELVIS 1V RIGHT  Result Date: 04/12/2022 CLINICAL DATA:  Intraoperative right hip replacement. EXAM: DG HIP (WITH OR WITHOUT PELVIS) 1V RIGHT COMPARISON:  Pelvis and bilateral hip radiographs 12/01/2020 FINDINGS: Images were performed intraoperatively without the presence of a radiologist. Interval total right hip arthroplasty. No hardware complication is seen. Brachytherapy seeds overlie the prostate bed. Severe left femoroacetabular joint space narrowing and peripheral osteophytosis. Total fluoroscopy images: 4 Total fluoroscopy time: 18 seconds Total dose: Radiation Exposure Index (as provided by the fluoroscopic device): 1.8 mGy air Kerma Please see intraoperative findings for further detail. IMPRESSION: Intraoperative images during total right hip arthroplasty. Electronically Signed   By: Yvonne Kendall M.D.   On: 04/12/2022 10:56   DG C-Arm 1-60 Min-No Report  Result Date: 04/12/2022 Fluoroscopy was utilized by the requesting physician.  No radiographic interpretation.    Assessment/Plan:    1. Unilateral primary osteoarthritis, right hip Led to #2  2. Status post total replacement of right hip Ready for discharge Will f/u with ortho Still using norco, can transition to tylenol over time Monitor for increased swelling and drainage (educate pt) Continue compression hose.   3. Hyponatremia Resolved likely due to fluid shifts with  surgery  4. Persistent atrial fibrillation (HCC) Back in SR on Tikosyn and Eliquis for CVA risk prevention Will be following up with Cardiology   5. Essential hypertension Controlled On cozaar  6. Mixed hyperlipidemia On Zocor  Patient is being discharged with the following home health services:  NA  Patient is being discharged with the following durable medical equipment:   NA Patient has been advised to f/u with their PCP in 1-2 weeks to for a transitions of care visit.  Social services at their facility was responsible for arranging this appointment.  Pt was provided with adequate prescriptions of noncontrolled medications to reach the scheduled appointment .  For controlled substances, a limited supply was provided as appropriate for the individual patient.  If the pt normally receives these medications from a pain clinic or has a contract with another physician, these medications should be received from that clinic or physician only).    Future labs/tests needed:  BMP

## 2022-04-18 NOTE — Telephone Encounter (Signed)
        Patient  visited [facility] [place] on 04/16/2022  for Tachycardia.   Telephone encounter attempt :  1st  A HIPAA compliant voice message was left requesting a return call.  Instructed patient to call back at [phone].   [place] Resource Care Guide   [email or web address]  ?? [id number]   Website: [email or web address]  [email or web address]

## 2022-04-18 NOTE — Telephone Encounter (Signed)
     Patient  visit on 04/16/2022  at [facility] was for Tachycardia.  Have you been able to follow up with your primary care physician?Patient has 04/26/22 appointment at [facility] clinic.  The patient was or was not able to obtain any needed medicine or equipment. No medication prescribed.  Are there diet recommendations that you are having difficulty following? No  Patient expresses understanding of discharge instructions and education provided has no other needs at this time. Yes   [place] Resource Care Guide   [email or web address]  ?? [id number]   Website: [email or web address]  [email or web address]

## 2022-04-18 NOTE — Telephone Encounter (Signed)
        Patient  visited [facility] [place] on 04/16/2022  for Tachycardia.   Telephone encounter attempt :  2nd  A HIPAA compliant voice message was left requesting a return call.  Instructed patient to call back at [phone].   [place] Resource Care Guide   [email or web address]  ?? [id number]   Website: [email or web address]  [email or web address]

## 2022-04-19 DIAGNOSIS — R2689 Other abnormalities of gait and mobility: Secondary | ICD-10-CM | POA: Diagnosis not present

## 2022-04-19 DIAGNOSIS — Z96641 Presence of right artificial hip joint: Secondary | ICD-10-CM | POA: Diagnosis not present

## 2022-04-19 DIAGNOSIS — M6259 Muscle wasting and atrophy, not elsewhere classified, multiple sites: Secondary | ICD-10-CM | POA: Diagnosis not present

## 2022-04-22 ENCOUNTER — Other Ambulatory Visit (HOSPITAL_BASED_OUTPATIENT_CLINIC_OR_DEPARTMENT_OTHER): Payer: Self-pay

## 2022-04-22 ENCOUNTER — Telehealth: Payer: Self-pay | Admitting: *Deleted

## 2022-04-22 ENCOUNTER — Other Ambulatory Visit: Payer: Self-pay | Admitting: Orthopaedic Surgery

## 2022-04-22 DIAGNOSIS — R2689 Other abnormalities of gait and mobility: Secondary | ICD-10-CM | POA: Diagnosis not present

## 2022-04-22 DIAGNOSIS — Z96641 Presence of right artificial hip joint: Secondary | ICD-10-CM | POA: Diagnosis not present

## 2022-04-22 DIAGNOSIS — M6259 Muscle wasting and atrophy, not elsewhere classified, multiple sites: Secondary | ICD-10-CM | POA: Diagnosis not present

## 2022-04-22 MED ORDER — HYDROCODONE-ACETAMINOPHEN 5-325 MG PO TABS: 1.0000 | ORAL_TABLET | ORAL | 0 refills | Status: DC | PRN

## 2022-04-22 MED ORDER — HYDROCODONE-ACETAMINOPHEN 5-325 MG PO TABS
1.0000 | ORAL_TABLET | ORAL | 0 refills | Status: DC | PRN
  Filled 2022-04-22: qty 30, 3d supply, fill #0

## 2022-04-22 NOTE — Telephone Encounter (Signed)
Patient doing well at home after leaving [facility] last week. Some lethargy he states and pain at night, but doing well otherwise. Requested refill of pain medication. Pharmacy on chart.

## 2022-04-22 NOTE — Telephone Encounter (Signed)
Patient aware medication has been sent in by MD.

## 2022-04-23 DIAGNOSIS — R2689 Other abnormalities of gait and mobility: Secondary | ICD-10-CM | POA: Diagnosis not present

## 2022-04-23 DIAGNOSIS — M6259 Muscle wasting and atrophy, not elsewhere classified, multiple sites: Secondary | ICD-10-CM | POA: Diagnosis not present

## 2022-04-23 DIAGNOSIS — Z96641 Presence of right artificial hip joint: Secondary | ICD-10-CM | POA: Diagnosis not present

## 2022-04-24 DIAGNOSIS — R2689 Other abnormalities of gait and mobility: Secondary | ICD-10-CM | POA: Diagnosis not present

## 2022-04-24 DIAGNOSIS — Z96641 Presence of right artificial hip joint: Secondary | ICD-10-CM | POA: Diagnosis not present

## 2022-04-24 DIAGNOSIS — M6259 Muscle wasting and atrophy, not elsewhere classified, multiple sites: Secondary | ICD-10-CM | POA: Diagnosis not present

## 2022-04-25 ENCOUNTER — Ambulatory Visit (INDEPENDENT_AMBULATORY_CARE_PROVIDER_SITE_OTHER): Payer: Medicare PPO | Admitting: Orthopaedic Surgery

## 2022-04-25 ENCOUNTER — Encounter: Payer: Self-pay | Admitting: Orthopaedic Surgery

## 2022-04-25 DIAGNOSIS — Z96641 Presence of right artificial hip joint: Secondary | ICD-10-CM

## 2022-04-25 DIAGNOSIS — R2689 Other abnormalities of gait and mobility: Secondary | ICD-10-CM | POA: Diagnosis not present

## 2022-04-25 DIAGNOSIS — M6259 Muscle wasting and atrophy, not elsewhere classified, multiple sites: Secondary | ICD-10-CM | POA: Diagnosis not present

## 2022-04-25 NOTE — Progress Notes (Signed)
The patient is here today for his first postoperative visit status post a right total hip arthroplasty.  He is someone who is on chronic Eliquis.  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  He has been taking hydrocodone but mainly just at night.  He is ambulating with a cane.  He reports that he is doing well overall.  On exam his right hip is still stiff to be expected.  He still has a hard time putting his shoes and socks on.  His calf is soft and he denies any foot and ankle swelling.  He has been compliant with wearing compressive stockings.  His right hip incision looks good.  The staples been removed and Steri-Strips applied.  There is no significant seroma.  He will continue to increase his activities as comfort allows.  Will see him back in 4 weeks to see how he is doing from a mobility standpoint but no x-rays are needed.

## 2022-04-26 ENCOUNTER — Telehealth: Payer: Self-pay | Admitting: *Deleted

## 2022-04-26 ENCOUNTER — Ambulatory Visit (HOSPITAL_COMMUNITY)
Admission: RE | Admit: 2022-04-26 | Discharge: 2022-04-26 | Disposition: A | Discharge status: Home / Self Care | Payer: Medicare PPO | Source: Ambulatory Visit | Attending: Physician Assistant | Admitting: Physician Assistant

## 2022-04-26 VITALS — BP 130/68 | HR 85 | Ht 66.0 in | Wt 152.0 lb

## 2022-04-26 DIAGNOSIS — Z79899 Other long term (current) drug therapy: Secondary | ICD-10-CM

## 2022-04-26 DIAGNOSIS — I10 Essential (primary) hypertension: Secondary | ICD-10-CM | POA: Insufficient documentation

## 2022-04-26 DIAGNOSIS — Z96641 Presence of right artificial hip joint: Secondary | ICD-10-CM | POA: Diagnosis not present

## 2022-04-26 DIAGNOSIS — Z7901 Long term (current) use of anticoagulants: Secondary | ICD-10-CM | POA: Diagnosis not present

## 2022-04-26 DIAGNOSIS — I4892 Unspecified atrial flutter: Secondary | ICD-10-CM | POA: Insufficient documentation

## 2022-04-26 DIAGNOSIS — I4819 Other persistent atrial fibrillation: Secondary | ICD-10-CM | POA: Insufficient documentation

## 2022-04-26 DIAGNOSIS — M6259 Muscle wasting and atrophy, not elsewhere classified, multiple sites: Secondary | ICD-10-CM | POA: Diagnosis not present

## 2022-04-26 DIAGNOSIS — Z5181 Encounter for therapeutic drug level monitoring: Secondary | ICD-10-CM | POA: Diagnosis not present

## 2022-04-26 DIAGNOSIS — D6869 Other thrombophilia: Secondary | ICD-10-CM | POA: Diagnosis not present

## 2022-04-26 DIAGNOSIS — R2689 Other abnormalities of gait and mobility: Secondary | ICD-10-CM | POA: Diagnosis not present

## 2022-04-26 NOTE — Progress Notes (Signed)
Primary Care Physician: Willow Ora, MD Referring Physician: Dr. Myrtis Ser EP: Dr. Laurie Panda is a 77 y.o. male with a h/o afib, frequent cardioversions in  the past. He has had afib/flutter ablation in 2012  and afib ablation fall of 2018. He was on norpace at one time, changed to amiodarone which was stopped shortly after last ablation, then back to Norpace. Unfortunately, he continued to have symptomatic breakthrough episodes of afib and the decision was made to stop Norpace and load on dofetilide.   On follow up today, patient presents for dofetilide admission. He states he stopped Norpace 5 days ago and has not missed any doses of anticoagulation in the last 3 weeks. He is in SR today.    F/u in afib clinic, 06/01/20. He is one week s/p tikosyn load. He did well maintaining SR in the hospital and  was d/c on 500 mcg dofetilide  bid with stable qtc. He feels well and has been staying  in rhythm.   F/u in afib clinic, 02/13/21. He is staying in rhythm. Unfortunately, he lost his wife suddenly in July 2022. He is exercising regulary. He is compliant with his drugs.   F/u in afib clinic, 05/13/21. He is here for Tikosyn surveillance. He has established with Dr. Lalla Brothers as of March of this year with  Dr. Johney Frame leaving the practice.  He is doing well staying in SR. Compliant with Tikosyn.   F/u in afib clinic, 8/29 for Tikosyn surveillance. He has not noted any afib. He does note elevated BP's at home 140-150 range which have slowly creeped up and is concerned he may need BP med. His ekg showed some bigeminy PVC's but on ausculation he sounded regular. Qt stable. Repeat ECG reassuring no longer showing PVCs.  Follow up in the AF clinic 04/01/22. He is doing well overall. He has not noted any episodes of Afib. He is scheduled for right hip replacement on 3/22 and has been instructed to stop Eliquis on 3/19. He has noted sporadic episodes of rapid regular heart rate (HR 100s)  about once every 2 months. These episodes will occur randomly and typically resolve after a few hours. Intermittent nosebleeds from October - December but this resolved on its own (he was followed by ENT). He has been compliant with Tikosyn and Eliquis.   On follow up today, patient underwent hip replacement on 04/11/21 and has done well with recovery. He was found to be tachycardic at rehab with rates in the 120s bpm. After 3 hours EMS was called and strips demonstrated atrial flutter. He was taken to the ED but spontaneously converted to SR en route. He has done well since that time with no further episodes.   Today, he denies symptoms of palpitations, chest pain, shortness of breath, orthopnea, PND, lower extremity edema, dizziness, presyncope, syncope, or neurologic sequela. The patient is tolerating medications without difficulties and is otherwise without complaint today.   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   Persistent atrial fibrillation    s/p PVI 3/12   Pneumonia    Prostate cancer 05/08/2021   04/2021   Past Surgical History:  Procedure Laterality Date  APPENDECTOMY  1970   atrial fibrillation ablation  03/27/2010   PVI with CTI ablation by JA   ATRIAL FIBRILLATION ABLATION N/A 11/21/2016   Procedure: ATRIAL FIBRILLATION ABLATION;  Surgeon: Hillis Range, MD;  Location: [facility] INVASIVE CV LAB;  Service: Cardiovascular;  Laterality: N/A;   CARDIOVERSION     CARDIOVERSION  04/26/2011   Procedure: CARDIOVERSION;  Surgeon: Laurey Morale, MD;  Location: [facility] ENDOSCOPY;  Service: Cardiovascular;  Laterality: N/A;   CARDIOVERSION N/A 04/28/2012   Procedure: CARDIOVERSION;  Surgeon: [name], MD;  Location: [facility] ENDOSCOPY;  Service:  Cardiovascular;  Laterality: N/A;   CARDIOVERSION N/A 07/28/2012   Procedure: CARDIOVERSION;  Surgeon: Lewayne Bunting, MD;  Location: [facility] ENDOSCOPY;  Service: Cardiovascular;  Laterality: N/A;   CARDIOVERSION N/A 08/14/2012   Procedure: CARDIOVERSION;  Surgeon: Wendall Stade, MD;  Location: [facility] ENDOSCOPY;  Service: Cardiovascular;  Laterality: N/A;   CARDIOVERSION N/A 08/11/2014   Procedure: CARDIOVERSION;  Surgeon: Pricilla Riffle, MD;  Location: [facility] ENDOSCOPY;  Service: Cardiovascular;  Laterality: N/A;   CARDIOVERSION N/A 08/26/2014   Procedure: CARDIOVERSION;  Surgeon: Thurmon Fair, MD;  Location: [facility] ENDOSCOPY;  Service: Cardiovascular;  Laterality: N/A;   CARDIOVERSION N/A 10/10/2016   Procedure: CARDIOVERSION;  Surgeon: Laurey Morale, MD;  Location: [facility] ENDOSCOPY;  Service: Cardiovascular;  Laterality: N/A;   CARDIOVERSION N/A 12/04/2016   Procedure: CARDIOVERSION;  Surgeon: Laurey Morale, MD;  Location: [facility] ENDOSCOPY;  Service: Cardiovascular;  Laterality: N/A;   CARDIOVERSION N/A 06/09/2018   Procedure: CARDIOVERSION;  Surgeon: Lars Masson, MD;  Location: [facility] ENDOSCOPY;  Service: Cardiovascular;  Laterality: N/A;   CARDIOVERSION N/A 08/17/2019   Procedure: CARDIOVERSION;  Surgeon: Chilton Si, MD;  Location: [facility] ENDOSCOPY;  Service: Cardiovascular;  Laterality: N/A;   CARDIOVERSION N/A 11/18/2019   Procedure: CARDIOVERSION;  Surgeon: Meriam Sprague, MD;  Location: [facility] ENDOSCOPY;  Service: Cardiovascular;  Laterality: N/A;   CARDIOVERSION N/A 03/09/2020   Procedure: CARDIOVERSION;  Surgeon: Jake Bathe, MD;  Location: [facility] ENDOSCOPY;  Service: Cardiovascular;  Laterality: N/A;   RADIOACTIVE SEED IMPLANT N/A 08/06/2021   Procedure: RADIOACTIVE SEED IMPLANT/BRACHYTHERAPY IMPLANT;  Surgeon: Marcine Matar, MD;  Location: [facility];  Service: Urology;  Laterality: N/A;   SKIN CANCER EXCISION     SPACE OAR INSTILLATION N/A 08/06/2021   Procedure:  SPACE OAR INSTILLATION;  Surgeon: Marcine Matar, MD;  Location: [facility];  Service: Urology;  Laterality: N/A;   TEE WITHOUT CARDIOVERSION  04/26/2011   Procedure: TRANSESOPHAGEAL ECHOCARDIOGRAM (TEE);  Surgeon: Laurey Morale, MD;  Location: [facility] ENDOSCOPY;  Service: Cardiovascular;  Laterality: N/A;  to be done at 1330   TEE WITHOUT CARDIOVERSION N/A 04/28/2012   Procedure: TRANSESOPHAGEAL ECHOCARDIOGRAM (TEE);  Surgeon: [name], MD;  Location: [facility] ENDOSCOPY;  Service: Cardiovascular;  Laterality: N/A;   TOTAL HIP ARTHROPLASTY Right 04/12/2022   Procedure: RIGHT TOTAL HIP ARTHROPLASTY ANTERIOR APPROACH;  Surgeon: Kathryne Hitch, MD;  Location: [facility] ORS;  Service: Orthopedics;  Laterality: Right;   WISDOM TOOTH EXTRACTION     yrs ago    Current Outpatient Medications  Medication Sig Dispense Refill   atorvastatin (LIPITOR) 10 MG tablet TAKE 1 TABLET BY MOUTH EVERY DAY 90 tablet 2   dofetilide (TIKOSYN) 500 MCG capsule TAKE 1 CAPSULE BY MOUTH TWICE A DAY 180 capsule 3   ELIQUIS 5 MG TABS tablet TAKE 1 TABLET BY MOUTH TWICE A DAY 180 tablet 1   fluticasone (CUTIVATE) 0.005 % ointment Apply  1 Application topically as needed.     HYDROcodone-acetaminophen (NORCO/VICODIN) 5-325 MG tablet Take 1-2 tablets by mouth every 4 (four) hours as needed for moderate pain (pain score 4-6). 30 tablet 0   losartan (COZAAR) 25 MG tablet TAKE 1 TABLET (25 MG TOTAL) BY MOUTH DAILY. 90 tablet 2   melatonin 5 MG TABS Take 5 mg by mouth at bedtime.     methocarbamol (ROBAXIN) 500 MG tablet Take 1 tablet (500 mg total) by mouth every 6 (six) hours as needed for muscle spasms. 40 tablet 1   metoprolol tartrate (LOPRESSOR) 25 MG tablet TAKE 1 TABLET BY MOUTH TWICE A DAY 180 tablet 2   Multiple Vitamin (MULTIVITAMIN WITH MINERALS) TABS tablet Take 1 tablet by mouth 4 (four) times a week.     sodium chloride (OCEAN) 0.65 % SOLN nasal spray Place 1 spray into both nostrils as needed  (dryness).     tamsulosin (FLOMAX) 0.4 MG CAPS capsule Take 1 capsule (0.4 mg total) by mouth daily. 90 capsule 3   No current facility-administered medications for this encounter.    No Known Allergies  Social History   Socioeconomic History   Marital status: Widowed    Spouse name: Not on file   Number of children: 2   Years of education: Not on file   Highest education level: Not on file  Occupational History   Occupation: professor    Employer: [employer]    Comment: retired   Tobacco Use   Smoking status: Former    Packs/day: 0.50    Years: 15.00    Additional pack years: 0.00    Total pack years: 7.50    Types: Cigarettes    Quit date: 11/28/1975    Years since quitting: 46.4   Smokeless tobacco: Never  Vaping Use   Vaping Use: Never used  Substance and Sexual Activity   Alcohol use: Yes    Alcohol/week: 6.0 standard drinks of alcohol    Types: 3 Glasses of wine, 3 Cans of beer per week    Comment: social   Drug use: No   Sexual activity: Yes  Other Topics Concern   Not on file  Social History Narrative    a professor of Investment banker, 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[place] @ [employer]    Social Determinants of Health   Financial Resource Strain: Low Risk  (06/09/2020)   Overall Financial Resource Strain (CARDIA)    Difficulty of Paying Living Expenses: Not hard at all  Food Insecurity: No Food Insecurity (04/12/2022)   Hunger Vital Sign    Worried About Running Out of Food in the Last Year: Never true    Ran Out of Food in the Last Year: Never true  Transportation Needs: No Transportation Needs (04/12/2022)   PRAPARE - Administrator, Civil Service (Medical): No    Lack of Transportation (Non-Medical): No  Physical Activity: Sufficiently Active (06/09/2020)   Exercise Vital Sign    Days of Exercise per Week: 6 days    Minutes of Exercise per Session: 60 min  Stress: No Stress Concern Present (06/09/2020)   [employer] of Occupational Health - Occupational Stress Questionnaire    Feeling of Stress : Not at all  Social Connections: Moderately Isolated (06/09/2020)   Social Connection and Isolation Panel [NHANES]    Frequency of Communication with Friends and Family: Twice a week    Frequency of Social Gatherings with Friends and Family: Three times  a week    Attends Religious Services: Never    Active Member of Clubs or Organizations: No    Attends Banker Meetings: Never    Marital Status: Married  Catering manager Violence: Not At Risk (04/12/2022)   Humiliation, Afraid, Rape, and Kick questionnaire    Fear of Current or Ex-Partner: No    Emotionally Abused: No    Physically Abused: No    Sexually Abused: No    Family History  Problem Relation Age of Onset   Coronary artery disease Other    Colon cancer Mother    Lung cancer Mother    Colon cancer Father    Early death Neg Hx    Hearing loss Neg Hx    Heart disease Neg Hx    Hyperlipidemia Neg Hx    Hypertension Neg Hx    Kidney disease Neg Hx    Stroke Neg Hx     ROS- All systems are reviewed and negative except as per the HPI above  Physical Exam: Vitals:   04/26/22 0826  BP: 130/68  Pulse: 85  Weight: 68.9 kg  Height: 5\' 6"  (1.676 m)    GEN- The patient is a well appearing elderly male, alert and oriented x 3 today.   HEENT-head normocephalic, atraumatic, sclera clear, conjunctiva pink, hearing intact, trachea midline. Lungs- Clear to ausculation bilaterally, normal work of breathing Heart- Regular rate and rhythm, no murmurs, rubs or gallops  GI- soft, NT, ND, + BS Extremities- no clubbing, cyanosis, or edema MS- no significant deformity or atrophy Skin- no rash or lesion Psych- euthymic mood, full affect Neuro- strength and sensation are intact   ECG today demonstrates SR, PVCs, T wave changes baseline Vent. rate 85 BPM PR interval 202 ms QRS duration 94 ms QT/QTcB 408/485 ms   Echo  11/23/19 1. The apical views are concerning for apical LV hypertrophy. . Left  ventricular ejection fraction, by estimation, is 60 to 65%. The left  ventricle has normal function. The left ventricle has no regional wall  motion abnormalities. There is severe apical   left ventricular hypertrophy. Left ventricular diastolic parameters are  indeterminate.   2. Right ventricular systolic function is normal. The right ventricular  size is normal.   3. The mitral valve is normal in structure. Mild mitral valve  regurgitation. No evidence of mitral stenosis.   4. The aortic valve is normal in structure. Aortic valve regurgitation is  not visualized. No aortic stenosis is present.    CHA2DS2-VASc Score = 4  The patient's score is based upon: CHF History: 0 HTN History: 1 Diabetes History: 0 Stroke History: 0 Vascular Disease History: 1 Age Score: 2 Gender Score: 0       ASSESSMENT AND PLAN: 1. Persistent Atrial Fibrillation/atrial flutter The patient's CHA2DS2-VASc score is 4, indicating a 4.8% annual risk of stroke.   S/p ablation 03/2010 and 11/21/16 Patient had dofetilide loading hospitalization May 2022 Suspect recent episode 2/2 surgery.  Continue tikosyn 500 mcg bid. QT a little longer than previous but still acceptable ~480-485 ms. Continue Eliquis 5 mg BID  Continue metoprolol tartrate 25 mg bid    2. Secondary Hypercoagulable State (ICD10:  D68.69) The patient is at significant risk for stroke/thromboembolism based upon his CHA2DS2-VASc Score of 4.  Continue Apixaban (Eliquis).   3. HCM Nonobstructive on echo.  4. HTN Stable, no changes today.    Follow up with Dr Lalla Brothers and Landry Mellow per recalls.    Jorja Loa,  PA-C Afib Clinic [facility] [address] [place], [place] [address] [phone]

## 2022-04-26 NOTE — Telephone Encounter (Signed)
Ortho bundle 14 day in office meeting completed. °

## 2022-04-29 ENCOUNTER — Other Ambulatory Visit: Payer: Self-pay | Admitting: Orthopaedic Surgery

## 2022-04-29 ENCOUNTER — Other Ambulatory Visit: Payer: Self-pay | Admitting: Cardiology

## 2022-04-29 ENCOUNTER — Other Ambulatory Visit (HOSPITAL_BASED_OUTPATIENT_CLINIC_OR_DEPARTMENT_OTHER): Payer: Self-pay

## 2022-04-29 ENCOUNTER — Telehealth: Payer: Self-pay | Admitting: *Deleted

## 2022-04-29 DIAGNOSIS — I4819 Other persistent atrial fibrillation: Secondary | ICD-10-CM

## 2022-04-29 DIAGNOSIS — E782 Mixed hyperlipidemia: Secondary | ICD-10-CM

## 2022-04-29 DIAGNOSIS — Z96641 Presence of right artificial hip joint: Secondary | ICD-10-CM | POA: Diagnosis not present

## 2022-04-29 DIAGNOSIS — M6259 Muscle wasting and atrophy, not elsewhere classified, multiple sites: Secondary | ICD-10-CM | POA: Diagnosis not present

## 2022-04-29 DIAGNOSIS — R2689 Other abnormalities of gait and mobility: Secondary | ICD-10-CM | POA: Diagnosis not present

## 2022-04-29 MED ORDER — METHOCARBAMOL 500 MG PO TABS: 500.0000 mg | ORAL_TABLET | Freq: Four times a day (QID) | ORAL | 1 refills | Status: DC | PRN

## 2022-04-29 MED ORDER — HYDROCODONE-ACETAMINOPHEN 5-325 MG PO TABS
1.0000 | ORAL_TABLET | Freq: Four times a day (QID) | ORAL | 0 refills | Status: DC | PRN
  Filled 2022-04-29: qty 30, 4d supply, fill #0

## 2022-04-29 MED ORDER — ATORVASTATIN CALCIUM 10 MG PO TABS
10.0000 mg | ORAL_TABLET | Freq: Every day | ORAL | 0 refills | Status: DC
Start: 2022-04-29 — End: 2022-05-23
  Filled 2022-04-29: qty 30, 30d supply, fill #0

## 2022-04-29 MED ORDER — APIXABAN 5 MG PO TABS
5.0000 mg | ORAL_TABLET | Freq: Two times a day (BID) | ORAL | 3 refills | Status: DC
  Filled 2022-04-29: qty 180, 90d supply, fill #0
  Filled 2022-08-05: qty 180, 90d supply, fill #1
  Filled 2022-11-09: qty 180, 90d supply, fill #2
  Filled 2023-02-11 – 2023-02-12 (×2): qty 180, 90d supply, fill #3

## 2022-04-29 MED ORDER — DOFETILIDE 500 MCG PO CAPS
500.0000 ug | ORAL_CAPSULE | Freq: Two times a day (BID) | ORAL | 0 refills | Status: DC
  Filled 2022-04-29: qty 60, 30d supply, fill #0

## 2022-04-29 NOTE — Telephone Encounter (Signed)
Prescription refill request for Eliquis received. Indication: Afib  Last office visit: 04/26/22 [name])  Scr: 0.7 (04/18/22)  Age: 77 Weight: 68.9kg  Appropriate dose. Refill sent.

## 2022-04-29 NOTE — Telephone Encounter (Signed)
Patient called and would like refill of Hydrocodone and Robaxin. Pharmacy on chart. Thank you.

## 2022-04-30 ENCOUNTER — Other Ambulatory Visit (HOSPITAL_BASED_OUTPATIENT_CLINIC_OR_DEPARTMENT_OTHER): Payer: Self-pay

## 2022-04-30 ENCOUNTER — Other Ambulatory Visit: Payer: Self-pay | Admitting: Orthopaedic Surgery

## 2022-04-30 MED ORDER — METHOCARBAMOL 500 MG PO TABS
500.0000 mg | ORAL_TABLET | Freq: Four times a day (QID) | ORAL | 1 refills | Status: DC | PRN
  Filled 2022-04-30: qty 40, 10d supply, fill #0

## 2022-04-30 NOTE — Telephone Encounter (Signed)
Looks like the Hydrocodone went through, but the Methocarbamol yesterday printed instead of sending to pharmacy. Could you resend just the muscle relaxer. Thank you.

## 2022-05-01 DIAGNOSIS — R2689 Other abnormalities of gait and mobility: Secondary | ICD-10-CM | POA: Diagnosis not present

## 2022-05-01 DIAGNOSIS — Z96641 Presence of right artificial hip joint: Secondary | ICD-10-CM | POA: Diagnosis not present

## 2022-05-01 DIAGNOSIS — M6259 Muscle wasting and atrophy, not elsewhere classified, multiple sites: Secondary | ICD-10-CM | POA: Diagnosis not present

## 2022-05-03 DIAGNOSIS — Z96641 Presence of right artificial hip joint: Secondary | ICD-10-CM | POA: Diagnosis not present

## 2022-05-03 DIAGNOSIS — M6259 Muscle wasting and atrophy, not elsewhere classified, multiple sites: Secondary | ICD-10-CM | POA: Diagnosis not present

## 2022-05-03 DIAGNOSIS — R2689 Other abnormalities of gait and mobility: Secondary | ICD-10-CM | POA: Diagnosis not present

## 2022-05-06 DIAGNOSIS — Z96641 Presence of right artificial hip joint: Secondary | ICD-10-CM | POA: Diagnosis not present

## 2022-05-06 DIAGNOSIS — R2689 Other abnormalities of gait and mobility: Secondary | ICD-10-CM | POA: Diagnosis not present

## 2022-05-06 DIAGNOSIS — M6259 Muscle wasting and atrophy, not elsewhere classified, multiple sites: Secondary | ICD-10-CM | POA: Diagnosis not present

## 2022-05-08 DIAGNOSIS — R2689 Other abnormalities of gait and mobility: Secondary | ICD-10-CM | POA: Diagnosis not present

## 2022-05-08 DIAGNOSIS — M6259 Muscle wasting and atrophy, not elsewhere classified, multiple sites: Secondary | ICD-10-CM | POA: Diagnosis not present

## 2022-05-08 DIAGNOSIS — Z96641 Presence of right artificial hip joint: Secondary | ICD-10-CM | POA: Diagnosis not present

## 2022-05-13 DIAGNOSIS — R2689 Other abnormalities of gait and mobility: Secondary | ICD-10-CM | POA: Diagnosis not present

## 2022-05-13 DIAGNOSIS — M6259 Muscle wasting and atrophy, not elsewhere classified, multiple sites: Secondary | ICD-10-CM | POA: Diagnosis not present

## 2022-05-13 DIAGNOSIS — Z96641 Presence of right artificial hip joint: Secondary | ICD-10-CM | POA: Diagnosis not present

## 2022-05-15 ENCOUNTER — Other Ambulatory Visit (HOSPITAL_BASED_OUTPATIENT_CLINIC_OR_DEPARTMENT_OTHER): Payer: Self-pay

## 2022-05-15 DIAGNOSIS — Z96641 Presence of right artificial hip joint: Secondary | ICD-10-CM | POA: Diagnosis not present

## 2022-05-15 DIAGNOSIS — M6259 Muscle wasting and atrophy, not elsewhere classified, multiple sites: Secondary | ICD-10-CM | POA: Diagnosis not present

## 2022-05-15 DIAGNOSIS — R2689 Other abnormalities of gait and mobility: Secondary | ICD-10-CM | POA: Diagnosis not present

## 2022-05-15 MED ORDER — AMOXICILLIN 500 MG PO CAPS
2000.0000 mg | ORAL_CAPSULE | ORAL | 1 refills | Status: DC
  Filled 2022-05-15: qty 8, 2d supply, fill #0

## 2022-05-16 ENCOUNTER — Telehealth: Payer: Self-pay | Admitting: *Deleted

## 2022-05-16 ENCOUNTER — Other Ambulatory Visit: Payer: Self-pay | Admitting: Physician Assistant

## 2022-05-16 ENCOUNTER — Other Ambulatory Visit (HOSPITAL_BASED_OUTPATIENT_CLINIC_OR_DEPARTMENT_OTHER): Payer: Self-pay

## 2022-05-16 MED ORDER — HYDROCODONE-ACETAMINOPHEN 5-325 MG PO TABS
1.0000 | ORAL_TABLET | Freq: Four times a day (QID) | ORAL | 0 refills | Status: DC | PRN
  Filled 2022-05-16: qty 30, 4d supply, fill #0

## 2022-05-16 NOTE — Telephone Encounter (Signed)
Patient called requesting refill of pain medication. Taking Norco mainly only at night. Thank you.

## 2022-05-20 DIAGNOSIS — Z96641 Presence of right artificial hip joint: Secondary | ICD-10-CM | POA: Diagnosis not present

## 2022-05-20 DIAGNOSIS — M6259 Muscle wasting and atrophy, not elsewhere classified, multiple sites: Secondary | ICD-10-CM | POA: Diagnosis not present

## 2022-05-20 DIAGNOSIS — R2689 Other abnormalities of gait and mobility: Secondary | ICD-10-CM | POA: Diagnosis not present

## 2022-05-22 ENCOUNTER — Other Ambulatory Visit: Payer: Self-pay | Admitting: Cardiology

## 2022-05-22 ENCOUNTER — Other Ambulatory Visit: Payer: Self-pay

## 2022-05-22 ENCOUNTER — Other Ambulatory Visit (HOSPITAL_BASED_OUTPATIENT_CLINIC_OR_DEPARTMENT_OTHER): Payer: Self-pay

## 2022-05-22 DIAGNOSIS — E782 Mixed hyperlipidemia: Secondary | ICD-10-CM

## 2022-05-22 DIAGNOSIS — M6259 Muscle wasting and atrophy, not elsewhere classified, multiple sites: Secondary | ICD-10-CM | POA: Diagnosis not present

## 2022-05-22 DIAGNOSIS — Z96641 Presence of right artificial hip joint: Secondary | ICD-10-CM | POA: Diagnosis not present

## 2022-05-22 DIAGNOSIS — R2689 Other abnormalities of gait and mobility: Secondary | ICD-10-CM | POA: Diagnosis not present

## 2022-05-23 ENCOUNTER — Encounter: Payer: Self-pay | Admitting: Cardiology

## 2022-05-23 ENCOUNTER — Telehealth: Payer: Self-pay | Admitting: *Deleted

## 2022-05-23 ENCOUNTER — Other Ambulatory Visit (HOSPITAL_BASED_OUTPATIENT_CLINIC_OR_DEPARTMENT_OTHER): Payer: Self-pay

## 2022-05-23 ENCOUNTER — Encounter: Payer: Self-pay | Admitting: Orthopaedic Surgery

## 2022-05-23 ENCOUNTER — Ambulatory Visit (INDEPENDENT_AMBULATORY_CARE_PROVIDER_SITE_OTHER): Payer: Medicare PPO | Admitting: Orthopaedic Surgery

## 2022-05-23 DIAGNOSIS — E782 Mixed hyperlipidemia: Secondary | ICD-10-CM

## 2022-05-23 DIAGNOSIS — Z96641 Presence of right artificial hip joint: Secondary | ICD-10-CM

## 2022-05-23 MED ORDER — DOFETILIDE 500 MCG PO CAPS
500.0000 ug | ORAL_CAPSULE | Freq: Two times a day (BID) | ORAL | 1 refills | Status: DC
  Filled 2022-05-23 – 2022-05-24 (×2): qty 180, 90d supply, fill #0
  Filled 2022-08-26: qty 180, 90d supply, fill #1

## 2022-05-23 MED ORDER — ATORVASTATIN CALCIUM 10 MG PO TABS
10.0000 mg | ORAL_TABLET | Freq: Every day | ORAL | 2 refills | Status: DC
Start: 2022-05-23 — End: 2023-02-19
  Filled 2022-05-23 – 2022-05-24 (×2): qty 90, 90d supply, fill #0
  Filled 2022-08-26: qty 90, 90d supply, fill #1
  Filled 2022-11-24: qty 90, 90d supply, fill #2

## 2022-05-23 NOTE — Telephone Encounter (Signed)
Ortho bundle in person meeting completed. 

## 2022-05-23 NOTE — Progress Notes (Signed)
The patient is now 6 weeks status post a right total hip arthroplasty.  He is an active 77 year old gentleman.  He would now like to transition back to Pilates.  I am fine with him doing so.  His Pilates instructor actually has a physical therapy background as well.  He reports that he is doing well overall.  He has been working through physical therapy and reports good range of motion and strength.  He is walking without any significant limp or assistive device.  His right operative hip has just minimal stiffness when I put him through range of motion but his range of motion is full.  He will continue to increase his activities as comfort allows with no restrictions.  Will see him back in 6 months unless he is having issues before then.  At that visit we will have a standing low AP pelvis and lateral of his right operative hip.

## 2022-05-24 ENCOUNTER — Other Ambulatory Visit (HOSPITAL_BASED_OUTPATIENT_CLINIC_OR_DEPARTMENT_OTHER): Payer: Self-pay

## 2022-05-24 ENCOUNTER — Other Ambulatory Visit: Payer: Self-pay

## 2022-05-27 DIAGNOSIS — Z96641 Presence of right artificial hip joint: Secondary | ICD-10-CM | POA: Diagnosis not present

## 2022-05-27 DIAGNOSIS — M6259 Muscle wasting and atrophy, not elsewhere classified, multiple sites: Secondary | ICD-10-CM | POA: Diagnosis not present

## 2022-05-27 DIAGNOSIS — R2689 Other abnormalities of gait and mobility: Secondary | ICD-10-CM | POA: Diagnosis not present

## 2022-06-06 MED FILL — Losartan Potassium Tab 25 MG: ORAL | 90 days supply | Qty: 90 | Fill #1 | Status: AC

## 2022-07-11 ENCOUNTER — Other Ambulatory Visit (HOSPITAL_BASED_OUTPATIENT_CLINIC_OR_DEPARTMENT_OTHER): Payer: Self-pay

## 2022-07-11 MED FILL — Metoprolol Tartrate Tab 25 MG: ORAL | 90 days supply | Qty: 180 | Fill #1 | Status: AC

## 2022-08-05 ENCOUNTER — Other Ambulatory Visit (HOSPITAL_BASED_OUTPATIENT_CLINIC_OR_DEPARTMENT_OTHER): Payer: Self-pay

## 2022-09-16 MED FILL — Losartan Potassium Tab 25 MG: ORAL | 90 days supply | Qty: 90 | Fill #2 | Status: AC

## 2022-09-18 ENCOUNTER — Encounter: Payer: Self-pay | Admitting: Cardiology

## 2022-09-18 ENCOUNTER — Ambulatory Visit: Payer: Medicare PPO | Attending: Cardiology | Admitting: Cardiology

## 2022-09-18 VITALS — BP 124/68 | HR 73 | Ht 66.0 in | Wt 156.4 lb

## 2022-09-18 DIAGNOSIS — I4819 Other persistent atrial fibrillation: Secondary | ICD-10-CM | POA: Diagnosis not present

## 2022-09-18 DIAGNOSIS — I10 Essential (primary) hypertension: Secondary | ICD-10-CM | POA: Diagnosis not present

## 2022-09-18 DIAGNOSIS — Z79899 Other long term (current) drug therapy: Secondary | ICD-10-CM

## 2022-09-18 NOTE — Progress Notes (Signed)
  Electrophysiology Office Follow up Visit Note:    Date:  09/18/2022   ID:  Ronnie Black, DOB 03/26/1945, MRN [id number]  PCP:  Willow Ora, MD  [facility] Cardiologist:  Lanier Prude, MD  [facility] Electrophysiologist:  Lanier Prude, MD    Interval History:    Ronnie Black is a 77 y.o. male who presents for a follow up visit.   Last seen by Clide Cliff in the A-fib clinic on April 26, 2022.  He has had prior ablations in 2012 and 2018 for his atrial fibrillation and flutter.  He takes Tikosyn for rhythm control.  He takes Eliquis for stroke prophylaxis.  He did have an episode of atrial flutter following her recent hip surgery. I previously met the patient in March 2023.  He tells me that he is doing well.  He remains active.  He exercises.  His heart rate is 64 at rest on average based on his watch readings.  His last episode of atrial fibrillation was at the beach in June/July.  He had 2 episodes that trip he attributes to dehydration.  He has been doing a better job staying hydrated and feels like that is improved his rhythm control.     Past medical, surgical, social and family history were reviewed.  ROS:   Please see the history of present illness.    All other systems reviewed and are negative.  EKGs/Labs/Other Studies Reviewed:    The following studies were reviewed today:  April 26, 2022 EKG shows sinus rhythm with a QTc of 485 ms.  PVCs.  EKG Interpretation Date/Time:  Wednesday September 18 2022 13:35:47 EDT Ventricular Rate:  73 PR Interval:  202 QRS Duration:  94 QT Interval:  414 QTC Calculation: 456 R Axis:   -24  Text Interpretation: Normal sinus rhythm Confirmed by Steffanie Dunn [phone]) on 09/18/2022 1:55:49 PM    Physical Exam:    VS:  BP 124/68   Pulse 73   Ht 5\' 6"  (1.676 m)   Wt 156 lb 6.4 oz (70.9 kg)   SpO2 95%   BMI 25.24 kg/m     Wt Readings from Last 3 Encounters:  09/18/22 156 lb 6.4 oz (70.9 kg)  04/26/22 152 lb  (68.9 kg)  04/18/22 153 lb 6.4 oz (69.6 kg)     GEN:  Well nourished, well developed in no acute distress CARDIAC: RRR, no murmurs, rubs, gallops RESPIRATORY:  Clear to auscultation without rales, wheezing or rhonchi       ASSESSMENT:    1. Persistent atrial fibrillation (HCC)   2. Encounter for long-term (current) use of high-risk medication   3. Hypertension, unspecified type    PLAN:    In order of problems listed above:   #Persistent atrial fibrillation #High risk med monitoring-Tikosyn Largely maintaining normal rhythm on Tikosyn. Continue Tikosyn 500 mcg by mouth twice daily Continue Eliquis for stroke prophylaxis Check BMP and magnesium today  #Hypertension At goal today.  Recommend checking blood pressures 1-2 times per week at home and recording the values.  Recommend bringing these recordings to the primary care physician. Continue losartan  Check BMP and magnesium today  Follow-up 4 months with A-fib clinic     Signed, Steffanie Dunn, MD, [facility], [facility] 09/18/2022 1:56 PM    Electrophysiology [facility]

## 2022-09-18 NOTE — Patient Instructions (Signed)
Medication Instructions:  Your physician recommends that you continue on your current medications as directed. Please refer to the Current Medication list given to you today.  *If you need a refill on your cardiac medications before your next appointment, please call your pharmacy*   Lab Work: TODAY: BMET and Mag  If you have labs (blood work) drawn today and your tests are completely normal, you will receive your results only by: MyChart Message (if you have MyChart) OR A paper copy in the mail If you have any lab test that is abnormal or we need to change your treatment, we will call you to review the results.  Follow-Up: At [facility], you and your health needs are our priority.  As part of our continuing mission to provide you with exceptional heart care, we have created designated Provider Care Teams.  These Care Teams include your primary Cardiologist (physician) and Advanced Practice Providers (APPs -  Physician Assistants and Nurse Practitioners) who all work together to provide you with the care you need, when you need it.  Your next appointment:   4 month(s)  Provider:   You will follow up in the Atrial Fibrillation Clinic located at [facility]. Your provider will be: Clint R. Fenton, PA-C  or Landry Mellow, PA-C

## 2022-09-19 LAB — BASIC METABOLIC PANEL
BUN/Creatinine Ratio: 16 (ref 10–24)
BUN: 13 mg/dL (ref 8–27)
CO2: 27 mmol/L (ref 20–29)
Calcium: 9.6 mg/dL (ref 8.6–10.2)
Chloride: 98 mmol/L (ref 96–106)
Creatinine, Ser: 0.82 mg/dL (ref 0.76–1.27)
Glucose: 77 mg/dL (ref 70–99)
Potassium: 4.6 mmol/L (ref 3.5–5.2)
Sodium: 139 mmol/L (ref 134–144)
eGFR: 89 mL/min/{1.73_m2} (ref >59)

## 2022-09-19 LAB — MAGNESIUM: Magnesium: 2.3 mg/dL (ref 1.6–2.3)

## 2022-09-20 ENCOUNTER — Ambulatory Visit (HOSPITAL_COMMUNITY): Payer: Medicare PPO | Admitting: Physician Assistant

## 2022-09-20 ENCOUNTER — Encounter: Payer: Self-pay | Admitting: Family Medicine

## 2022-09-20 DIAGNOSIS — Z800 Family history of malignant neoplasm of digestive organs: Secondary | ICD-10-CM

## 2022-09-25 NOTE — Addendum Note (Signed)
Addended by: Asencion Partridge on: 09/25/2022 07:48 AM   Modules accepted: Orders

## 2022-10-10 ENCOUNTER — Other Ambulatory Visit (HOSPITAL_BASED_OUTPATIENT_CLINIC_OR_DEPARTMENT_OTHER): Payer: Self-pay

## 2022-10-10 MED ORDER — COMIRNATY 30 MCG/0.3ML IM SUSY
PREFILLED_SYRINGE | Freq: Once | INTRAMUSCULAR | 0 refills | Status: AC
  Filled 2022-10-10: qty 0.3, 1d supply, fill #0

## 2022-10-11 MED FILL — Metoprolol Tartrate Tab 25 MG: ORAL | 90 days supply | Qty: 180 | Fill #2 | Status: AC

## 2022-11-09 ENCOUNTER — Other Ambulatory Visit (HOSPITAL_BASED_OUTPATIENT_CLINIC_OR_DEPARTMENT_OTHER): Payer: Self-pay

## 2022-11-11 ENCOUNTER — Other Ambulatory Visit: Payer: Self-pay

## 2022-11-12 ENCOUNTER — Encounter: Payer: Self-pay | Admitting: Physician Assistant

## 2022-11-19 ENCOUNTER — Other Ambulatory Visit (HOSPITAL_BASED_OUTPATIENT_CLINIC_OR_DEPARTMENT_OTHER): Payer: Self-pay

## 2022-11-19 MED ORDER — INFLUENZA VAC A&B SURF ANT ADJ 0.5 ML IM SUSY
0.5000 mL | PREFILLED_SYRINGE | Freq: Once | INTRAMUSCULAR | 0 refills | Status: AC
  Filled 2022-11-19: qty 0.5, 1d supply, fill #0

## 2022-11-24 ENCOUNTER — Other Ambulatory Visit: Payer: Self-pay | Admitting: Cardiology

## 2022-11-25 ENCOUNTER — Other Ambulatory Visit: Payer: Self-pay

## 2022-11-25 ENCOUNTER — Telehealth: Payer: Self-pay | Admitting: Family Medicine

## 2022-11-25 NOTE — Telephone Encounter (Signed)
Pt would like to get his labwork done before hi physical. Please put orders in and call pt back to make lab only appt. Please advise.

## 2022-11-26 ENCOUNTER — Other Ambulatory Visit (HOSPITAL_BASED_OUTPATIENT_CLINIC_OR_DEPARTMENT_OTHER): Payer: Self-pay

## 2022-11-26 MED ORDER — DOFETILIDE 500 MCG PO CAPS
500.0000 ug | ORAL_CAPSULE | Freq: Two times a day (BID) | ORAL | 3 refills | Status: DC
  Filled 2022-11-26: qty 180, 90d supply, fill #0
  Filled 2023-03-01: qty 180, 90d supply, fill #1
  Filled 2023-05-26: qty 180, 90d supply, fill #2
  Filled 2023-09-01: qty 180, 90d supply, fill #3

## 2022-11-28 ENCOUNTER — Other Ambulatory Visit: Payer: Self-pay

## 2022-11-28 DIAGNOSIS — Z0000 Encounter for general adult medical examination without abnormal findings: Secondary | ICD-10-CM

## 2022-12-03 ENCOUNTER — Other Ambulatory Visit (HOSPITAL_BASED_OUTPATIENT_CLINIC_OR_DEPARTMENT_OTHER): Payer: Self-pay

## 2022-12-03 DIAGNOSIS — C61 Malignant neoplasm of prostate: Secondary | ICD-10-CM | POA: Diagnosis not present

## 2022-12-03 DIAGNOSIS — R3915 Urgency of urination: Secondary | ICD-10-CM | POA: Diagnosis not present

## 2022-12-03 DIAGNOSIS — R350 Frequency of micturition: Secondary | ICD-10-CM | POA: Diagnosis not present

## 2022-12-03 DIAGNOSIS — N5201 Erectile dysfunction due to arterial insufficiency: Secondary | ICD-10-CM | POA: Diagnosis not present

## 2022-12-03 MED ORDER — MIRABEGRON ER 25 MG PO TB24
25.0000 mg | ORAL_TABLET | Freq: Every day | ORAL | 5 refills | Status: DC
  Filled 2022-12-03: qty 30, 30d supply, fill #0
  Filled 2022-12-30: qty 30, 30d supply, fill #1
  Filled 2023-01-28: qty 30, 30d supply, fill #2

## 2022-12-04 ENCOUNTER — Other Ambulatory Visit (HOSPITAL_BASED_OUTPATIENT_CLINIC_OR_DEPARTMENT_OTHER): Payer: Self-pay

## 2022-12-04 MED ORDER — TAMSULOSIN HCL 0.4 MG PO CAPS
0.4000 mg | ORAL_CAPSULE | Freq: Every day | ORAL | 1 refills | Status: DC
  Filled 2022-12-04: qty 90, 90d supply, fill #0

## 2022-12-09 ENCOUNTER — Other Ambulatory Visit (HOSPITAL_COMMUNITY): Payer: Self-pay

## 2022-12-12 ENCOUNTER — Other Ambulatory Visit (HOSPITAL_BASED_OUTPATIENT_CLINIC_OR_DEPARTMENT_OTHER): Payer: Self-pay

## 2022-12-14 ENCOUNTER — Encounter: Payer: Self-pay | Admitting: Cardiology

## 2022-12-17 ENCOUNTER — Other Ambulatory Visit (HOSPITAL_BASED_OUTPATIENT_CLINIC_OR_DEPARTMENT_OTHER): Payer: Self-pay

## 2022-12-17 MED ORDER — METOPROLOL TARTRATE 25 MG PO TABS
25.0000 mg | ORAL_TABLET | Freq: Two times a day (BID) | ORAL | 0 refills | Status: DC
  Filled 2022-12-17 – 2023-01-09 (×2): qty 180, 90d supply, fill #0

## 2022-12-18 ENCOUNTER — Other Ambulatory Visit (INDEPENDENT_AMBULATORY_CARE_PROVIDER_SITE_OTHER): Payer: Medicare PPO

## 2022-12-18 ENCOUNTER — Encounter: Payer: Self-pay | Admitting: Orthopaedic Surgery

## 2022-12-18 ENCOUNTER — Ambulatory Visit: Payer: Medicare PPO | Admitting: Orthopaedic Surgery

## 2022-12-18 DIAGNOSIS — Z96641 Presence of right artificial hip joint: Secondary | ICD-10-CM

## 2022-12-18 NOTE — Progress Notes (Signed)
The patient is an active 77 year old gentleman who is 8 months status post a right total hip replacement.  This was to treat severe right hip osteoarthritis.  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  He is walking without assistive device.  He walks a lot of miles and does hiking as well.  He is a thin individual.  On exam he has excellent range of motion of his right operative hip.  There is some stiffness with his left hip on rotation but he is asymptomatic and exhibits no pain at all.  He walks with a normal gait.  An AP pelvis shows a well-seated right total hip arthroplasty with no complicating features.  There is moderate arthritis of the left hip that has not changed when compared to films from November 2022.  At this point follow-up can be as needed since he is doing so well.  If he does have any issues with either hip he knows to let us know.

## 2022-12-24 ENCOUNTER — Other Ambulatory Visit (HOSPITAL_BASED_OUTPATIENT_CLINIC_OR_DEPARTMENT_OTHER): Payer: Self-pay

## 2022-12-24 ENCOUNTER — Other Ambulatory Visit (HOSPITAL_COMMUNITY): Payer: Self-pay | Admitting: *Deleted

## 2022-12-24 ENCOUNTER — Other Ambulatory Visit: Payer: Self-pay

## 2022-12-24 MED ORDER — LOSARTAN POTASSIUM 25 MG PO TABS
25.0000 mg | ORAL_TABLET | Freq: Every day | ORAL | 2 refills | Status: DC
  Filled 2022-12-24: qty 90, 90d supply, fill #0
  Filled 2023-03-25: qty 90, 90d supply, fill #1
  Filled 2023-06-20: qty 90, 90d supply, fill #2

## 2022-12-27 ENCOUNTER — Other Ambulatory Visit (HOSPITAL_BASED_OUTPATIENT_CLINIC_OR_DEPARTMENT_OTHER): Payer: Self-pay

## 2022-12-27 MED ORDER — TAMSULOSIN HCL 0.4 MG PO CAPS
0.4000 mg | ORAL_CAPSULE | Freq: Every day | ORAL | 1 refills | Status: DC
  Filled 2022-12-27 – 2023-03-04 (×2): qty 90, 90d supply, fill #0
  Filled 2023-05-26: qty 90, 90d supply, fill #1

## 2022-12-30 ENCOUNTER — Other Ambulatory Visit (HOSPITAL_BASED_OUTPATIENT_CLINIC_OR_DEPARTMENT_OTHER): Payer: Self-pay

## 2022-12-31 ENCOUNTER — Other Ambulatory Visit (HOSPITAL_BASED_OUTPATIENT_CLINIC_OR_DEPARTMENT_OTHER): Payer: Self-pay

## 2023-01-02 ENCOUNTER — Other Ambulatory Visit (INDEPENDENT_AMBULATORY_CARE_PROVIDER_SITE_OTHER): Payer: Medicare PPO

## 2023-01-02 DIAGNOSIS — Z0000 Encounter for general adult medical examination without abnormal findings: Secondary | ICD-10-CM

## 2023-01-02 LAB — LIPID PANEL
Cholesterol: 117 mg/dL (ref 0–200)
HDL: 43 mg/dL (ref >39.00)
LDL Cholesterol: 59 mg/dL (ref 0–99)
NonHDL: 73.74
Total CHOL/HDL Ratio: 3
Triglycerides: 73 mg/dL (ref 0.0–149.0)
VLDL: 14.6 mg/dL (ref 0.0–40.0)

## 2023-01-02 LAB — CBC WITH DIFFERENTIAL/PLATELET
Basophils Absolute: 0 10*3/uL (ref 0.0–0.1)
Basophils Relative: 0.8 % (ref 0.0–3.0)
Eosinophils Absolute: 0.1 10*3/uL (ref 0.0–0.7)
Eosinophils Relative: 3.1 % (ref 0.0–5.0)
HCT: 45.1 % (ref 39.0–52.0)
Hemoglobin: 15.3 g/dL (ref 13.0–17.0)
Lymphocytes Relative: 28.5 % (ref 12.0–46.0)
Lymphs Abs: 1.4 10*3/uL (ref 0.7–4.0)
MCHC: 34 g/dL (ref 30.0–36.0)
MCV: 88.1 fL (ref 78.0–100.0)
Monocytes Absolute: 0.4 10*3/uL (ref 0.1–1.0)
Monocytes Relative: 9 % (ref 3.0–12.0)
Neutro Abs: 2.8 10*3/uL (ref 1.4–7.7)
Neutrophils Relative %: 58.6 % (ref 43.0–77.0)
Platelets: 212 10*3/uL (ref 150.0–400.0)
RBC: 5.12 Mil/uL (ref 4.22–5.81)
RDW: 13.4 % (ref 11.5–15.5)
WBC: 4.8 10*3/uL (ref 4.0–10.5)

## 2023-01-02 LAB — COMPREHENSIVE METABOLIC PANEL
ALT: 17 U/L (ref 0–53)
AST: 16 U/L (ref 0–37)
Albumin: 4.3 g/dL (ref 3.5–5.2)
Alkaline Phosphatase: 60 U/L (ref 39–117)
BUN: 10 mg/dL (ref 6–23)
CO2: 30 meq/L (ref 19–32)
Calcium: 9 mg/dL (ref 8.4–10.5)
Chloride: 101 meq/L (ref 96–112)
Creatinine, Ser: 0.87 mg/dL (ref 0.40–1.50)
GFR: 82.02 mL/min (ref >60.00)
Glucose, Bld: 87 mg/dL (ref 70–99)
Potassium: 4.1 meq/L (ref 3.5–5.1)
Sodium: 139 meq/L (ref 135–145)
Total Bilirubin: 0.7 mg/dL (ref 0.2–1.2)
Total Protein: 6.7 g/dL (ref 6.0–8.3)

## 2023-01-02 LAB — TSH: TSH: 5.26 u[IU]/mL (ref 0.35–5.50)

## 2023-01-02 NOTE — Progress Notes (Signed)
01/03/2023 Ronnie Black [id number] September 08, 1945  Referring provider: Willow Ora, MD Primary GI doctor: Dr. Adela Lank  ASSESSMENT AND PLAN:   Family history of colon cancer Last colon 2019 Mother with colon cancer in 14's, father in 32's No symptoms We had a long discussion about the purpose of colon cancer screening and the different methods, including FIT test, Cologuard, virtual colonoscopy, and colonoscopy, including the benefits and their inherent limitations.  With shared decision making we have decided to proceed with the colonoscopy since patient appears to be in good health at this time.  We have discussed the risks of bleeding, infection, perforation, medication reactions,  and remote risk of death associated with colonoscopy.  All questions were answered and the patient acknowledges these risk and wishes to proceed.  Persistent atrial fibrillation (HCC) Hold Eliquis for 2 days before procedure will instruct when and how to resume after procedure.  Patient understands that there is a low but real risk of cardiovascular event such as heart attack, stroke, or embolism /  thrombosis, or ischemia while off Eliquis  The patient consents to proceed.  Will communicate by phone or EMR with patient's prescribing provider to confirm that holding Eliquis is reasonable in this case.   HOCM (hypertrophic obstructive cardiomyopathy) (HCC) Non obstructing, last Echo 2021 No CP, SOB  History of prostate cancer S/p brachytherapy 2019 No hematochezia, no symptoms    Patient Care Team: Willow Ora, MD as PCP - General (Family Medicine) Lanier Prude, MD as PCP - Electrophysiology (Cardiology) Lanier Prude, MD as PCP - Cardiology (Cardiology) Hillis Range, MD (Inactive) (Cardiology) [facility], [facility] as Consulting Physician (Urology) Tomma Lightning, MD as Consulting Physician (Pulmonary Disease) Sharrell Ku, MD as Consulting  Physician (Gastroenterology) Marcine Matar, MD as Consulting Physician (Urology)  HISTORY OF PRESENT ILLNESS: 77 y.o. male referred by Willow Ora, MD presents for consultation of colonoscopy while on blood thinner. He has past medical history significant for atrial fibrillation, hypertrophic obstructive cardiomyopathy not obstructive, prostate cancer, and others listed below, Patient is on Eliquis for atrial fibrillation, status post ablation 2012 and 2018, prescribed by Dr. Lalla Brothers.  He has no chest pain, no SOB.   He had first colonoscopy at age 49, previously saw Dr. Kinnie Scales, was having colonoscopy every 5 years, never had polyp, had hemorrhoids s/p banding years ago. He states last was on 04/2017, prior to the pandemic.  He saw [facility] in July for screening colon but they wanted to do the procedure in [place] which would have been inconvenient, so PCP referred here.    Patient has  family history of colon cancer or other gastrointestinal malignancies, mother with colon cancer and father had colon cancer at age 35. 05/06/2017 colonoscopy, recall 5 years.  Patient had brachytherapy in 2023 for prostate cancer, he states after that if he had urination would have small amount of stool but this has improved.  He takes metamucil, has soft formed stools with occ diarrhea, can follow a meal. Uses lactate with milk.  Patient denies change in bowel habits, constipation, hematochezia.  Denies changes in appetite, unintentional weight loss.  Patient does not complain of GERD.  Patient denies dysphagia, nausea, vomiting, melena.   CBC on 01/02/2023  WBC 4.8 HGB 15.3 MCV 88.1 Platelets 212.0 no anemia  He  reports that he quit smoking about 47 years ago. His smoking use included cigarettes. He started smoking about 62 years ago. He has a 7.5 pack-year  smoking history. He has never used smokeless tobacco. He reports current alcohol use of about 6.0 standard drinks of alcohol per week. He  reports that he does not use drugs.  RELEVANT LABS AND IMAGING:  CBC    Component Value Date/Time   WBC 4.8 01/02/2023 0830   RBC 5.12 01/02/2023 0830   HGB 15.3 01/02/2023 0830   HCT 45.1 01/02/2023 0830   PLT 212.0 01/02/2023 0830   MCV 88.1 01/02/2023 0830   MCH 29.7 04/15/2022 2215   MCHC 34.0 01/02/2023 0830   RDW 13.4 01/02/2023 0830   LYMPHSABS 1.4 01/02/2023 0830   MONOABS 0.4 01/02/2023 0830   EOSABS 0.1 01/02/2023 0830   BASOSABS 0.0 01/02/2023 0830   Recent Labs    04/03/22 1406 04/13/22 0335 04/15/22 2215 01/02/23 0830  HGB 15.0 14.1 13.4 15.3    CMP     Component Value Date/Time   NA 139 01/02/2023 0830   NA 139 09/18/2022 1407   K 4.1 01/02/2023 0830   CL 101 01/02/2023 0830   CO2 30 01/02/2023 0830   GLUCOSE 87 01/02/2023 0830   BUN 10 01/02/2023 0830   BUN 13 09/18/2022 1407   CREATININE 0.87 01/02/2023 0830   CREATININE 0.95 12/01/2019 1033   CALCIUM 9.0 01/02/2023 0830   PROT 6.7 01/02/2023 0830   ALBUMIN 4.3 01/02/2023 0830   AST 16 01/02/2023 0830   ALT 17 01/02/2023 0830   ALKPHOS 60 01/02/2023 0830   BILITOT 0.7 01/02/2023 0830   GFRNONAA >60 04/15/2022 2215   GFRNONAA 77 12/01/2019 1033   GFRAA 90 12/01/2019 1033      Latest Ref Rng & Units 01/02/2023    8:30 AM 04/03/2022    2:06 PM 12/06/2021   10:07 AM  Hepatic Function  Total Protein 6.0 - 8.3 g/dL 6.7  6.3  6.4   Albumin 3.5 - 5.2 g/dL 4.3  4.3  4.4   AST 0 - 37 U/L 16  24  22    ALT 0 - 53 U/L 17  25  31    Alk Phosphatase 39 - 117 U/L 60  51  57   Total Bilirubin 0.2 - 1.2 mg/dL 0.7  0.7  0.8       Current Medications:    Current Outpatient Medications (Cardiovascular):    atorvastatin (LIPITOR) 10 MG tablet, Take 1 tablet (10 mg total) by mouth daily.   dofetilide (TIKOSYN) 500 MCG capsule, Take 1 capsule (500 mcg total) by mouth 2 (two) times daily.   losartan (COZAAR) 25 MG tablet, Take 1 tablet (25 mg total) by mouth daily.   metoprolol tartrate (LOPRESSOR) 25  MG tablet, TAKE 1 TABLET BY MOUTH TWICE A DAY  Current Outpatient Medications (Respiratory):    sodium chloride (OCEAN) 0.65 % SOLN nasal spray, Place 1 spray into both nostrils as needed (dryness).   Current Outpatient Medications (Hematological):    apixaban (ELIQUIS) 5 MG TABS tablet, Take 1 tablet (5 mg total) by mouth 2 (two) times daily.  Current Outpatient Medications (Other):    melatonin 5 MG TABS, Take 5 mg by mouth at bedtime.   mirabegron ER (MYRBETRIQ) 25 MG TB24 tablet, Take 1 tablet (25 mg total) by mouth daily.   Multiple Vitamin (MULTIVITAMIN WITH MINERALS) TABS tablet, Take 1 tablet by mouth 4 (four) times a week.   tamsulosin (FLOMAX) 0.4 MG CAPS capsule, Take 1 capsule (0.4 mg total) by mouth daily.   fluticasone (CUTIVATE) 0.005 % ointment, Apply 1 Application topically as needed. (  Patient not taking: Reported on 01/03/2023)  Medical History:  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   Persistent atrial fibrillation (HCC)    s/p PVI 3/12   Pneumonia    Prostate cancer (HCC) 05/08/2021   04/2021   Allergies: No Known Allergies   Surgical History:  He  has a past surgical history that includes Appendectomy (1970); atrial fibrillation ablation (03/27/2010); Wisdom tooth extraction; Skin cancer excision; Cardioversion; TEE without cardioversion (04/26/2011); Cardioversion (04/26/2011); TEE without cardioversion (N/A, 04/28/2012); Cardioversion (N/A, 04/28/2012); Cardioversion (N/A, 07/28/2012); Cardioversion (N/A, 08/14/2012); Cardioversion (N/A, 08/11/2014); Cardioversion (N/A, 08/26/2014); Cardioversion (N/A, 10/10/2016); ATRIAL FIBRILLATION ABLATION (N/A, 11/21/2016); Cardioversion (N/A, 12/04/2016); Cardioversion (N/A,  06/09/2018); Cardioversion (N/A, 08/17/2019); Cardioversion (N/A, 11/18/2019); Cardioversion (N/A, 03/09/2020); Radioactive seed implant (N/A, 08/06/2021); SPACE OAR INSTILLATION (N/A, 08/06/2021); Total hip arthroplasty (Right, 04/12/2022); and Hip arthroscopy. Family History:  His family history includes Colon cancer in his father and mother; Coronary artery disease in an other family member; Lung cancer in his mother.  REVIEW OF SYSTEMS  : All other systems reviewed and negative except where noted in the History of Present Illness.  PHYSICAL EXAM: BP 120/80   Pulse 67   Ht 5\' 6"  (1.676 m)   Wt 154 lb (69.9 kg)   BMI 24.86 kg/m  General Appearance: Well nourished, in no apparent distress. Head:   Normocephalic and atraumatic. Eyes:  sclerae anicteric,conjunctive pink  Respiratory: Respiratory effort normal, BS equal bilaterally without rales, rhonchi, wheezing. Cardio: RRR with no MRGs. Peripheral pulses intact.  Abdomen: Soft,  Non-distended ,active bowel sounds. No tenderness . No masses. Rectal: Not evaluated Musculoskeletal: Full ROM, Normal gait. Without edema. Skin:  Dry and intact without significant lesions or rashes Neuro: Alert and  oriented x4;  No focal deficits. Psych:  Cooperative. Normal mood and affect.    Doree Albee, PA-C 10:02 AM

## 2023-01-03 ENCOUNTER — Encounter: Payer: Self-pay | Admitting: Physician Assistant

## 2023-01-03 ENCOUNTER — Other Ambulatory Visit (HOSPITAL_BASED_OUTPATIENT_CLINIC_OR_DEPARTMENT_OTHER): Payer: Self-pay

## 2023-01-03 ENCOUNTER — Ambulatory Visit: Payer: Medicare PPO | Admitting: Physician Assistant

## 2023-01-03 VITALS — BP 120/80 | HR 67 | Ht 66.0 in | Wt 154.0 lb

## 2023-01-03 DIAGNOSIS — I421 Obstructive hypertrophic cardiomyopathy: Secondary | ICD-10-CM

## 2023-01-03 DIAGNOSIS — Z8546 Personal history of malignant neoplasm of prostate: Secondary | ICD-10-CM | POA: Diagnosis not present

## 2023-01-03 DIAGNOSIS — I4819 Other persistent atrial fibrillation: Secondary | ICD-10-CM | POA: Diagnosis not present

## 2023-01-03 DIAGNOSIS — Z800 Family history of malignant neoplasm of digestive organs: Secondary | ICD-10-CM | POA: Diagnosis not present

## 2023-01-03 MED ORDER — NA SULFATE-K SULFATE-MG SULF 17.5-3.13-1.6 GM/177ML PO SOLN
1.0000 | Freq: Once | ORAL | 0 refills | Status: AC
  Filled 2023-01-03: qty 354, 1d supply, fill #0

## 2023-01-03 NOTE — Progress Notes (Signed)
Agree with assessment and plan as outlined.  

## 2023-01-03 NOTE — Patient Instructions (Signed)
 You have been scheduled for a colonoscopy. Please follow written instructions given to you at your visit today.   Please pick up your prep supplies at the pharmacy within the next 1-3 days.  If you use inhalers (even only as needed), please bring them with you on the day of your procedure.  DO NOT TAKE 7 DAYS PRIOR TO TEST- Trulicity (dulaglutide) Ozempic, Wegovy (semaglutide) Mounjaro (tirzepatide) Bydureon Bcise (exanatide extended release)  DO NOT TAKE 1 DAY PRIOR TO YOUR TEST Rybelsus (semaglutide) Adlyxin (lixisenatide) Victoza (liraglutide) Byetta (exanatide) ___________________________________________________________________________   Due to recent changes in healthcare laws, you may see the results of your imaging and laboratory studies on MyChart before your provider has had a chance to review them.  We understand that in some cases there may be results that are confusing or concerning to you. Not all laboratory results come back in the same time frame and the provider may be waiting for multiple results in order to interpret others.  Please give [name] 48 hours in order for your provider to thoroughly review all the results before contacting the office for clarification of your results.    I appreciate the  opportunity to care for you  Thank You   [facility]

## 2023-01-07 ENCOUNTER — Other Ambulatory Visit (HOSPITAL_BASED_OUTPATIENT_CLINIC_OR_DEPARTMENT_OTHER): Payer: Self-pay

## 2023-01-07 MED ORDER — AMOXICILLIN 500 MG PO CAPS
ORAL_CAPSULE | ORAL | 1 refills | Status: DC
  Filled 2023-01-07: qty 30, 9d supply, fill #0

## 2023-01-08 ENCOUNTER — Telehealth: Payer: Self-pay | Admitting: *Deleted

## 2023-01-08 ENCOUNTER — Encounter: Payer: Self-pay | Admitting: Family Medicine

## 2023-01-08 ENCOUNTER — Ambulatory Visit: Payer: Medicare PPO | Admitting: Family Medicine

## 2023-01-08 VITALS — BP 132/64 | HR 78 | Temp 98.0°F | Ht 66.0 in | Wt 155.8 lb

## 2023-01-08 DIAGNOSIS — I700 Atherosclerosis of aorta: Secondary | ICD-10-CM | POA: Diagnosis not present

## 2023-01-08 DIAGNOSIS — I4891 Unspecified atrial fibrillation: Secondary | ICD-10-CM

## 2023-01-08 DIAGNOSIS — Z7901 Long term (current) use of anticoagulants: Secondary | ICD-10-CM

## 2023-01-08 DIAGNOSIS — I421 Obstructive hypertrophic cardiomyopathy: Secondary | ICD-10-CM

## 2023-01-08 DIAGNOSIS — I10 Essential (primary) hypertension: Secondary | ICD-10-CM | POA: Diagnosis not present

## 2023-01-08 DIAGNOSIS — Z8546 Personal history of malignant neoplasm of prostate: Secondary | ICD-10-CM

## 2023-01-08 DIAGNOSIS — N400 Enlarged prostate without lower urinary tract symptoms: Secondary | ICD-10-CM

## 2023-01-08 DIAGNOSIS — Z800 Family history of malignant neoplasm of digestive organs: Secondary | ICD-10-CM

## 2023-01-08 DIAGNOSIS — E782 Mixed hyperlipidemia: Secondary | ICD-10-CM

## 2023-01-08 DIAGNOSIS — Z0001 Encounter for general adult medical examination with abnormal findings: Secondary | ICD-10-CM

## 2023-01-08 DIAGNOSIS — I4819 Other persistent atrial fibrillation: Secondary | ICD-10-CM

## 2023-01-08 DIAGNOSIS — C61 Malignant neoplasm of prostate: Secondary | ICD-10-CM

## 2023-01-08 NOTE — Progress Notes (Signed)
Subjective  Chief Complaint  Patient presents with   Annual Exam    Pt here for Annual Exam and is not currently fasting. Colonoscopy is scheduled for 02/26/2023    HPI: Ronnie Black is a 77 y.o. male who presents to [facility] [place] today for a Male Wellness Visit. He also has the concerns and/or needs as listed above in the chief complaint. These will be addressed in addition to the Health Maintenance Visit.   Wellness Visit: annual visit with health maintenance review and exam   HM: colonoscopy due and scheduled for February 2025. + FH.  He continues to do well. Active. Traveling. No concerns. Traveling to [place] for the [employer], leaves on Friday! Follows with multiple specialists; I've reviewed cards, gi and ortho notes.   Body mass index is 25.15 kg/m. Wt Readings from Last 3 Encounters:  01/08/23 155 lb 12.8 oz (70.7 kg)  01/03/23 154 lb (69.9 kg)  09/18/22 156 lb 6.4 oz (70.9 kg)     Chronic disease management visit and/or acute problem visit: Discussed the use of AI scribe software for clinical note transcription with the patient, who gave verbal consent to proceed.  History of Present Illness   The patient presents for an annual physical. He reports an infected tooth discovered during a recent dental cleaning and checkup, which will require a root canal. He has no discomfort from the tooth and is not concerned about the upcoming procedure.  The patient had a hip replacement, which has healed well with no complications. He reports no issues with the hip and feels as if he never had a problem. He recently had a follow-up for the hip replacement, and the x-ray looked good.  H/o prostated cancer: The patient continues to experience some lingering side effects from brachytherapy, including an increased need to urinate more frequently. However, he has no incontinence. He manages this by stopping every hour during long drives. On flomax  and myrbetriq  Afib s/p cardioversion and stable. Long term anticoagulation w/o bleeding. No further epistaxis  HLD with LDL 58 on statin. At goal.  We reviewed his lab results below     Assessment   1. Encounter for well adult exam with abnormal findings   2. Aortic atherosclerosis (HCC)   3. Atrial fibrillation status post cardioversion (HCC)   4. Current use of long term anticoagulation   5. Essential hypertension   6. HOCM (hypertrophic obstructive cardiomyopathy) (HCC)   7. Family history of colon cancer   8. Hypercoagulable state due to persistent atrial fibrillation (HCC)   9. Malignant neoplasm of prostate (HCC)   10. Mixed hyperlipidemia   11. Benign prostatic hyperplasia without lower urinary tract symptoms      Plan  Male Wellness Visit: Age appropriate Health Maintenance and Prevention measures were discussed with patient. Included topics are cancer screening recommendations, ways to keep healthy (see AVS) including dietary and exercise recommendations, regular eye and dental care, use of seat belts, and avoidance of moderate alcohol use and tobacco use. Colonoscopy scheduled.  BMI: discussed patient's BMI and encouraged positive lifestyle modifications to help get to or maintain a target BMI. HM needs and immunizations were addressed and ordered. See below for orders. See HM and immunization section for updates. utd Routine labs and screening tests ordered including cmp, cbc and lipids where appropriate. Discussed recommendations regarding Vit D and calcium supplementation (see AVS)  Chronic disease f/u and/or acute problem visit: (deemed necessary to be  done in addition to the wellness visit): Assessment and Plan    Dental Infection Asymptomatic dental infection identified during routine cleaning. Requires root canal. Endodontist scheduled root canal for January and prescribed antibiotics as a precaution for upcoming trip to [place]. Potential significant crack in the  tooth may necessitate extraction and implant instead of root canal. - Proceed with root canal in January - Take prescribed antibiotics if infection flares up  Post-Brachytherapy Urinary Frequency/h/o prostate cancer Increased urinary frequency as a lingering side effect of brachytherapy. No incontinence. Symptom manageable but requires frequent stops during long drives. - Monitor urinary symptoms and continue flomax and myrbetria  HOCM/afib s/p cardioversion/HTN and long term anticoagulatoin: - remain well controlled on current meds.   HLD is at goal on lipitor nightly.   General Health Maintenance Lab work, including cholesterol and liver function tests, is normal. No major risk factors for osteoporosis. Recent hip replacement follow-up showed good healing. Adequate vitamin D intake from dairy consumption. - No need for vitamin D screening - Continue annual physical exams  Follow-up - Continue follow-ups with specialists - Proceed with scheduled colonoscopy in February.     Follow up: 12 mo for cpe Commons side effects, risks, benefits, and alternatives for medications and treatment plan prescribed today were discussed, and the patient expressed understanding of the given instructions. Patient is instructed to call or message via MyChart if he/she has any questions or concerns regarding our treatment plan. No barriers to understanding were identified. We discussed Red Flag symptoms and signs in detail. Patient expressed understanding regarding what to do in case of urgent or emergency type symptoms.  Medication list was reconciled, printed and provided to the patient in AVS. Patient instructions and summary information was reviewed with the patient as documented in the AVS. This note was prepared with assistance of Dragon voice recognition software. Occasional wrong-word or sound-a-like substitutions may have occurred due to the inherent limitations of voice recognition software  No  orders of the defined types were placed in this encounter.  No orders of the defined types were placed in this encounter.    Patient Active Problem List   Diagnosis Date Noted   Hypercoagulable state due to persistent atrial fibrillation (HCC) 04/01/2022   Aortic atherosclerosis (HCC) 05/22/2021   Malignant neoplasm of prostate (HCC) 05/08/2021   Essential hypertension 12/01/2019   Current use of long term anticoagulation 11/06/2018   Atrial fibrillation status post cardioversion (HCC) 02/24/2017   Family history of colon cancer 10/01/2012   Mixed hyperlipidemia 08/13/2008   HOCM (hypertrophic obstructive cardiomyopathy) (HCC) 08/13/2008   Status post total replacement of right hip 04/12/2022   Bilateral primary osteoarthritis of hip 12/04/2020   Persistent atrial fibrillation (HCC) 11/21/2016   Benign prostatic hyperplasia without lower urinary tract symptoms 11/07/2016   Herniated lumbar disc without myelopathy 07/08/2012   ED (erectile dysfunction) 01/07/2012   Actinic keratosis 01/07/2012   Seborrheic keratosis 01/07/2012   CAROTID BRUIT 08/24/2008   Health Maintenance  Topic Date Due   Medicare Annual Wellness (AWV)  06/09/2021   COVID-19 Vaccine (7 - 2024-25 season) 01/24/2023 (Originally 12/05/2022)   Colonoscopy  03/22/2023 (Originally 05/07/2022)   DTaP/Tdap/Td (2 - Td or Tdap) 11/08/2026   Pneumonia Vaccine 44+ Years old  Completed   INFLUENZA VACCINE  Completed   Hepatitis C Screening  Completed   Zoster Vaccines- Shingrix  Completed   HPV VACCINES  Aged Out   Immunization History  Administered Date(s) Administered   Fluad Quad(high Dose 65+) 11/02/2019,  10/04/2020   Fluad Trivalent(High Dose 65+) 11/19/2022   Influenza, High Dose Seasonal PF 11/02/2015, 11/07/2016, 11/13/2017, 11/22/2021   Influenza, Seasonal, Injecte, Preservative Fre 11/04/2013   Moderna Covid-19 Vaccine Bivalent Booster 41yrs & up 11/02/2020   Moderna SARS-COV2 Booster Vaccination 06/29/2020    Moderna Sars-Covid-2 Vaccination 03/03/2019, 03/24/2019, 12/07/2019   Pfizer(Comirnaty)Fall Seasonal Vaccine 12 years and older 04/05/2022, 10/10/2022   Pneumococcal Conjugate-13 12/02/2013   Pneumococcal Polysaccharide-23 01/07/2012   Tdap 11/07/2016   Zoster Recombinant(Shingrix) 12/04/2018, 06/03/2019   Zoster, Live 01/22/2010   We updated and reviewed the patient's past history in detail and it is documented below. Allergies: Patient has no known allergies. Past Medical History  has a past medical history of Complication of anesthesia, Current use of long term anticoagulation (11/06/2018), DDD (degenerative disc disease), HLD (hyperlipidemia), Hypertension, Hypertr obst cardiomyop, Persistent atrial fibrillation (HCC), Pneumonia, and Prostate cancer (HCC) (05/08/2021). Past Surgical History Patient  has a past surgical history that includes Appendectomy (1970); atrial fibrillation ablation (03/27/2010); Wisdom tooth extraction; Skin cancer excision; Cardioversion; TEE without cardioversion (04/26/2011); Cardioversion (04/26/2011); TEE without cardioversion (N/A, 04/28/2012); Cardioversion (N/A, 04/28/2012); Cardioversion (N/A, 07/28/2012); Cardioversion (N/A, 08/14/2012); Cardioversion (N/A, 08/11/2014); Cardioversion (N/A, 08/26/2014); Cardioversion (N/A, 10/10/2016); ATRIAL FIBRILLATION ABLATION (N/A, 11/21/2016); Cardioversion (N/A, 12/04/2016); Cardioversion (N/A, 06/09/2018); Cardioversion (N/A, 08/17/2019); Cardioversion (N/A, 11/18/2019); Cardioversion (N/A, 03/09/2020); Radioactive seed implant (N/A, 08/06/2021); SPACE OAR INSTILLATION (N/A, 08/06/2021); Total hip arthroplasty (Right, 04/12/2022); and Hip arthroscopy. Social History Patient  reports that he quit smoking about 47 years ago. His smoking use included cigarettes. He started smoking about 62 years ago. He has a 7.5 pack-year smoking history. He has never used smokeless tobacco. He reports current alcohol use of about 6.0  standard drinks of alcohol per week. He reports that he does not use drugs. Family History family history includes Colon cancer in his father and mother; Coronary artery disease in an other family member; Lung cancer in his mother. Review of Systems: Constitutional: negative for fever or malaise Ophthalmic: negative for photophobia, double vision or loss of vision Cardiovascular: negative for chest pain, dyspnea on exertion, or new LE swelling Respiratory: negative for SOB or persistent cough Gastrointestinal: negative for abdominal pain, change in bowel habits or melena Genitourinary: negative for dysuria or gross hematuria Musculoskeletal: negative for new gait disturbance or muscular weakness Integumentary: negative for new or persistent rashes Neurological: negative for TIA or stroke symptoms Psychiatric: negative for SI or delusions Allergic/Immunologic: negative for hives  Patient Care Team    Relationship Specialty Notifications Start End  Willow Ora, MD PCP - General Family Medicine  11/06/18   Lanier Prude, MD PCP - Electrophysiology Cardiology  04/03/21   Lanier Prude, MD PCP - Cardiology Cardiology  05/22/21   Hillis Range, MD (Inactive)  Cardiology  12/10/10   Pa, [facility] Consulting Physician Urology  11/06/18   Tomma Lightning, MD Consulting Physician Pulmonary Disease  11/06/18   Sharrell Ku, MD Consulting Physician Gastroenterology  12/01/19   Marcine Matar, MD Consulting Physician Urology  03/09/21    Objective  Vitals: BP 132/64   Pulse 78   Temp 98 F (36.7 C)   Ht 5\' 6"  (1.676 m)   Wt 155 lb 12.8 oz (70.7 kg)   SpO2 98%   BMI 25.15 kg/m  General:  Well developed, well nourished, no acute distress , appears younger than stated age Psych:  Alert and orientedx3,normal mood and affect HEENT:  Normocephalic, atraumatic, non-icteric sclera,  oropharynx is clear without  mass or exudate, supple neck without adenopathy,  or thyromegaly Cardiovascular:  Normal S1, S2, RRR without gallop Respiratory:  Good breath sounds bilaterally, CTAB with normal respiratory effort Gastrointestinal: normal bowel sounds, soft, non-tender, no noted masses. No HSM MSK: Joints are without erythema or swelling.  Skin:  Warm, no rashes Neurologic:    Mental status is normal.  Gross motor and sensory exams are normal. Stable gait. No tremor

## 2023-01-08 NOTE — Patient Instructions (Signed)
Please return in 12 months for your annual complete physical; please come fasting.   If you have any questions or concerns, please don't hesitate to send me a message via MyChart or call the office at [phone]. Thank you for visiting with [name] today! It's our pleasure caring for you.   VISIT SUMMARY:  You came in today for your annual physical. We discussed your recent dental infection, your hip replacement recovery, and the lingering side effects from your brachytherapy. Overall, your lab work looks good, and your recent hip x-ray showed good healing.  YOUR PLAN:  -DENTAL INFECTION: You have a dental infection that was found during a routine cleaning. It requires a root canal, which is scheduled for January. The endodontist has prescribed antibiotics for you to take if the infection flares up, especially since you have a trip to [place] coming up. If the tooth has a significant crack, it may need to be extracted and replaced with an implant.  -GENERAL HEALTH MAINTENANCE: Your lab work, including cholesterol and liver function tests, is normal. You have no major risk factors for osteoporosis, and your recent hip replacement follow-up showed good healing. You are getting enough vitamin D from your diet, so no additional screening is needed. Continue with your annual physical exams.  INSTRUCTIONS:  Please continue with your follow-ups with specialists and proceed with your scheduled colonoscopy in February.

## 2023-01-08 NOTE — Telephone Encounter (Signed)
[facility] Pre-operative Risk Assessment     Request for surgical clearance:     Endoscopy Procedure  What type of surgery is being performed?     Colonoscopy  When is this surgery scheduled?     02/26/2023  What type of clearance is required ?   Pharmacy  Are there any medications that need to be held prior to surgery and how long? Eliquis 2 days  Practice name and name of physician performing surgery?      [facility]  What is your office phone and fax number?      Phone- [phone]  Fax- [phone]  Anesthesia type (None, local, MAC, general) ?       MAC   Please route your response to Merri Ray or Lucius Conn

## 2023-01-09 ENCOUNTER — Other Ambulatory Visit (HOSPITAL_BASED_OUTPATIENT_CLINIC_OR_DEPARTMENT_OTHER): Payer: Self-pay

## 2023-01-09 ENCOUNTER — Other Ambulatory Visit: Payer: Self-pay

## 2023-01-09 NOTE — Telephone Encounter (Signed)
   Patient Name: Ronnie Black  DOB: 1945/07/18 MRN: [id number]  Primary Cardiologist: Lanier Prude, MD  Clinical pharmacists have reviewed the patient's past medical history, labs, and current medications as part of preoperative protocol coverage. The following recommendations have been made:  Per office protocol, patient can hold Eliquis for 2 days prior to procedure.    I will route this recommendation to the requesting party via Epic fax function and remove from pre-op pool.  Please call with questions.  Napoleon Form, Leodis Rains, NP 01/09/2023, 2:33 PM

## 2023-01-09 NOTE — Telephone Encounter (Signed)
Welles has been informed to hold his Eliquis 2 days prior to his procedure 02/26/2023. He verbalized understanding.

## 2023-01-09 NOTE — Telephone Encounter (Signed)
Patient with diagnosis of A Fib on Eliquis for anticoagulation.    Procedure: colonoscopy Date of procedure: 02/26/23   CHA2DS2-VASc Score = 5  This indicates a 7.2% annual risk of stroke. The patient's score is based upon: CHF History: 1 HTN History: 1 Diabetes History: 0 Stroke History: 0 Vascular Disease History: 1 Age Score: 2 Gender Score: 0    CrCl 68 mL/min Platelet count 212K   Per office protocol, patient can hold Eliquis for 2 days prior to procedure.     **This guidance is not considered finalized until pre-operative APP has relayed final recommendations.**

## 2023-01-20 ENCOUNTER — Ambulatory Visit (HOSPITAL_COMMUNITY)
Admission: RE | Admit: 2023-01-20 | Discharge: 2023-01-20 | Disposition: A | Discharge status: Home / Self Care | Payer: Medicare PPO | Source: Ambulatory Visit | Attending: Physician Assistant | Admitting: Physician Assistant

## 2023-01-20 VITALS — BP 138/68 | HR 74 | Ht 66.0 in | Wt 154.4 lb

## 2023-01-20 DIAGNOSIS — Z79899 Other long term (current) drug therapy: Secondary | ICD-10-CM | POA: Insufficient documentation

## 2023-01-20 DIAGNOSIS — I421 Obstructive hypertrophic cardiomyopathy: Secondary | ICD-10-CM | POA: Diagnosis not present

## 2023-01-20 DIAGNOSIS — R9431 Abnormal electrocardiogram [ECG] [EKG]: Secondary | ICD-10-CM | POA: Diagnosis not present

## 2023-01-20 DIAGNOSIS — Z7901 Long term (current) use of anticoagulants: Secondary | ICD-10-CM | POA: Insufficient documentation

## 2023-01-20 DIAGNOSIS — I4892 Unspecified atrial flutter: Secondary | ICD-10-CM | POA: Insufficient documentation

## 2023-01-20 DIAGNOSIS — I4891 Unspecified atrial fibrillation: Secondary | ICD-10-CM | POA: Diagnosis present

## 2023-01-20 DIAGNOSIS — D6869 Other thrombophilia: Secondary | ICD-10-CM | POA: Insufficient documentation

## 2023-01-20 DIAGNOSIS — I10 Essential (primary) hypertension: Secondary | ICD-10-CM | POA: Insufficient documentation

## 2023-01-20 DIAGNOSIS — I4819 Other persistent atrial fibrillation: Secondary | ICD-10-CM | POA: Insufficient documentation

## 2023-01-20 DIAGNOSIS — Z5181 Encounter for therapeutic drug level monitoring: Secondary | ICD-10-CM | POA: Insufficient documentation

## 2023-01-20 LAB — MAGNESIUM: Magnesium: 2.1 mg/dL (ref 1.7–2.4)

## 2023-01-20 NOTE — Progress Notes (Signed)
Primary Care Physician: Willow Ora, MD Referring Physician: Dr. Myrtis Ser Ronnie Black: Dr. Laurie Panda is a 77 y.o. male with a h/o afib, frequent cardioversions in  the past. He has had afib/flutter ablation in 2012  and afib ablation fall of 2018. He was on norpace at one time, changed to amiodarone which was stopped shortly after last ablation, then back to Norpace. Unfortunately, he continued to have symptomatic breakthrough episodes of afib and the decision was made to stop Norpace and load on dofetilide.   On follow up today, patient presents for dofetilide admission. He states he stopped Norpace 5 days ago and has not missed any doses of anticoagulation in the last 3 weeks. He is in SR today.    F/u in afib clinic, 06/01/20. He is one week s/p tikosyn load. He did well maintaining SR in the hospital and  was d/c on 500 mcg dofetilide  bid with stable qtc. He feels well and has been staying  in rhythm.   F/u in afib clinic, 02/13/21. He is staying in rhythm. Unfortunately, he lost his wife suddenly in July 2022. He is exercising regulary. He is compliant with his drugs.   F/u in afib clinic, 05/13/21. He is here for Tikosyn surveillance. He has established with Dr. Lalla Black as of March of this year with  Dr. Johney Black leaving the practice.  He is doing well staying in SR. Compliant with Tikosyn.   F/u in afib clinic, 8/29 for Tikosyn surveillance. He has not noted any afib. He does note elevated BP's at home 140-150 range which have slowly creeped up and is concerned he may need BP med. His ekg showed some bigeminy PVC's but on ausculation he sounded regular. Qt stable. Repeat ECG reassuring no longer showing PVCs.  Follow up in the AF clinic 04/01/22. He is doing well overall. He has not noted any episodes of Afib. He is scheduled for right hip replacement on 3/22 and has been instructed to stop Eliquis on 3/19. He has noted sporadic episodes of rapid regular heart rate (HR 100s)  about once every 2 months. These episodes will occur randomly and typically resolve after a few hours. Intermittent nosebleeds from October - December but this resolved on its own (he was followed by ENT). He has been compliant with Tikosyn and Eliquis.   On follow up 04/26/22, patient underwent hip replacement on 04/11/21 and has done well with recovery. He was found to be tachycardic at rehab with rates in the 120s bpm. After 3 hours EMS was called and strips demonstrated atrial flutter. He was taken to the ED but spontaneously converted to SR en route. He has done well since that time with no further episodes.   Follow up in the AF clinic 01/20/23. Patient reports that he had one brief episode of afib in November that resolved quickly. He denies any bleeding issues on anticoagulation. He remains very active.   Today, he denies symptoms of palpitations, chest pain, shortness of breath, orthopnea, PND, lower extremity edema, dizziness, presyncope, syncope, or neurologic sequela. The patient is tolerating medications without difficulties and is otherwise without complaint today.   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 180 tablet 3   atorvastatin (LIPITOR) 10 MG tablet Take 1 tablet (10 mg total) by mouth daily. 90 tablet 2   dofetilide (TIKOSYN) 500 MCG capsule Take 1 capsule (500 mcg total) by mouth 2 (two) times daily. 180 capsule 3   fluticasone (CUTIVATE) 0.005 % ointment Apply  1 Application topically as needed.     losartan (COZAAR) 25 MG tablet Take 1 tablet (25 mg total) by mouth daily. 90 tablet 2   melatonin 5 MG TABS Take 5 mg by mouth at bedtime.     metoprolol tartrate (LOPRESSOR) 25 MG tablet Take 1 tablet (25 mg total) by mouth 2 (two) times daily. 180 tablet 0   mirabegron ER (MYRBETRIQ) 25 MG TB24 tablet Take 1 tablet (25 mg total) by mouth daily. 30 tablet 5   Multiple Vitamin (MULTIVITAMIN WITH MINERALS) TABS tablet Take 1 tablet by mouth 4 (four) times a week.     sodium chloride (OCEAN) 0.65 % SOLN nasal spray Place 1 spray into both nostrils as needed (dryness).     tamsulosin (FLOMAX) 0.4 MG CAPS capsule Take 1 capsule (0.4 mg total) by mouth daily. 90 capsule 1   amoxicillin (AMOXIL) 500 MG capsule Take 2 capsules by mouth now, then take 1 capsules by mouth three times daily until gone. (Patient not taking: Reported on 01/20/2023) 30 capsule 1   No current facility-administered medications for this encounter.    ROS- All systems are reviewed and negative except as per the HPI above  Physical Exam: Vitals:   01/20/23 1331  BP: 138/68  Pulse: 74  Weight: 70 kg  Height: 5\' 6"  (1.676 m)     GEN: Well nourished, well developed in no acute distress NECK: No JVD; No carotid bruits CARDIAC: Regular rate and rhythm with occasional ectopy, no murmurs, rubs, gallops RESPIRATORY:  Clear to auscultation without rales, wheezing or rhonchi  ABDOMEN: Soft, non-tender, non-distended EXTREMITIES:  No edema; No deformity    ECG today demonstrates SR, PVC, baseline T wave inv laterally Vent. rate 74 BPM PR interval 188 ms QRS duration 102 ms QT/QTcB 416/461 ms   Echo 11/23/19 1. The apical views are concerning for apical LV hypertrophy. . Left  ventricular ejection fraction, by estimation, is 60 to 65%. The left  ventricle has normal function. The left ventricle has no regional wall  motion abnormalities. 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 Left ventricular diastolic parameters are  indeterminate.   2. Right ventricular systolic function is normal. The right ventricular  size is normal.   3. The mitral valve is normal in structure. Mild mitral valve  regurgitation. No evidence of mitral stenosis.   4. The aortic valve is normal in structure. Aortic valve regurgitation is  not visualized. No aortic stenosis is present.    CHA2DS2-VASc Score = 5  The patient's score is based upon: CHF History: 1 HTN History: 1 Diabetes History: 0 Stroke History: 0 Vascular Disease History: 1 Age Score: 2 Gender Score: 0       ASSESSMENT AND PLAN: Persistent Atrial Fibrillation/atrial flutter The patient's CHA2DS2-VASc score is 5, indicating a 7.2% annual risk of stroke.  S/p ablation 03/2010 and 11/21/16 Patient had dofetilide loading hospitalization May 2022 Continue Tikosyn 500 mcg BID, QT stable Check magnesium today, recent Cmet reviewed Continue Eliquis 5 mg BID  Continue metoprolol tartrate 25 mg BID  Secondary Hypercoagulable State (ICD10:  D68.69) The patient is at significant risk for stroke/thromboembolism based upon his CHA2DS2-VASc Score of 5.  Continue Apixaban (Eliquis).   HCM Nonobstructive on echo  HTN Stable on current regimen   Follow up in the AF clinic in 6 months.    Jorja Loa, PA-C [facility] [address] [place], [place] [address] [phone]

## 2023-01-29 DIAGNOSIS — N130 Hydronephrosis with ureteropelvic junction obstruction: Secondary | ICD-10-CM | POA: Diagnosis not present

## 2023-01-29 DIAGNOSIS — N5201 Erectile dysfunction due to arterial insufficiency: Secondary | ICD-10-CM | POA: Diagnosis not present

## 2023-01-29 DIAGNOSIS — C61 Malignant neoplasm of prostate: Secondary | ICD-10-CM | POA: Diagnosis not present

## 2023-01-29 DIAGNOSIS — R350 Frequency of micturition: Secondary | ICD-10-CM | POA: Diagnosis not present

## 2023-01-29 DIAGNOSIS — R3915 Urgency of urination: Secondary | ICD-10-CM | POA: Diagnosis not present

## 2023-01-29 DIAGNOSIS — R3914 Feeling of incomplete bladder emptying: Secondary | ICD-10-CM | POA: Diagnosis not present

## 2023-02-11 ENCOUNTER — Other Ambulatory Visit (HOSPITAL_BASED_OUTPATIENT_CLINIC_OR_DEPARTMENT_OTHER): Payer: Self-pay

## 2023-02-12 ENCOUNTER — Other Ambulatory Visit (HOSPITAL_BASED_OUTPATIENT_CLINIC_OR_DEPARTMENT_OTHER): Payer: Self-pay

## 2023-02-13 ENCOUNTER — Other Ambulatory Visit (HOSPITAL_BASED_OUTPATIENT_CLINIC_OR_DEPARTMENT_OTHER): Payer: Self-pay

## 2023-02-13 MED ORDER — CLINDAMYCIN HCL 300 MG PO CAPS
300.0000 mg | ORAL_CAPSULE | Freq: Three times a day (TID) | ORAL | 1 refills | Status: DC
  Filled 2023-02-13: qty 21, 7d supply, fill #0

## 2023-02-17 DIAGNOSIS — D3131 Benign neoplasm of right choroid: Secondary | ICD-10-CM | POA: Diagnosis not present

## 2023-02-17 DIAGNOSIS — H2513 Age-related nuclear cataract, bilateral: Secondary | ICD-10-CM | POA: Diagnosis not present

## 2023-02-17 DIAGNOSIS — H52203 Unspecified astigmatism, bilateral: Secondary | ICD-10-CM | POA: Diagnosis not present

## 2023-02-18 DIAGNOSIS — Z85828 Personal history of other malignant neoplasm of skin: Secondary | ICD-10-CM | POA: Diagnosis not present

## 2023-02-18 DIAGNOSIS — L820 Inflamed seborrheic keratosis: Secondary | ICD-10-CM | POA: Diagnosis not present

## 2023-02-19 ENCOUNTER — Other Ambulatory Visit: Payer: Self-pay | Admitting: Cardiology

## 2023-02-19 ENCOUNTER — Other Ambulatory Visit (HOSPITAL_BASED_OUTPATIENT_CLINIC_OR_DEPARTMENT_OTHER): Payer: Self-pay

## 2023-02-19 DIAGNOSIS — E782 Mixed hyperlipidemia: Secondary | ICD-10-CM

## 2023-02-19 MED ORDER — ATORVASTATIN CALCIUM 10 MG PO TABS
10.0000 mg | ORAL_TABLET | Freq: Every day | ORAL | 3 refills | Status: AC
  Filled 2023-02-19: qty 90, 90d supply, fill #0
  Filled 2023-05-21: qty 90, 90d supply, fill #1
  Filled 2023-11-01: qty 90, 90d supply, fill #2
  Filled 2024-02-01: qty 90, 90d supply, fill #3

## 2023-02-20 NOTE — Telephone Encounter (Signed)
I have left detailed messages on both home and cell #'s with instructions to hold his eliquis.

## 2023-02-20 NOTE — Telephone Encounter (Signed)
Inbound call from patient requesting to discuss holding Eliquis. Please advise, thank you.

## 2023-02-21 NOTE — Telephone Encounter (Signed)
I have left him detailed messages on both his #'s again about holding the Eliquis 2 days prior to his procedure.

## 2023-02-21 NOTE — Telephone Encounter (Signed)
PT called to let us know that he received our message about holding the Eliquis.

## 2023-02-26 ENCOUNTER — Ambulatory Visit: Payer: Medicare PPO | Admitting: Gastroenterology

## 2023-02-26 ENCOUNTER — Encounter: Payer: Self-pay | Admitting: Gastroenterology

## 2023-02-26 VITALS — BP 109/70 | HR 69 | Temp 97.2°F | Resp 21 | Ht 66.0 in | Wt 154.0 lb

## 2023-02-26 DIAGNOSIS — D122 Benign neoplasm of ascending colon: Secondary | ICD-10-CM | POA: Diagnosis not present

## 2023-02-26 DIAGNOSIS — Z1211 Encounter for screening for malignant neoplasm of colon: Secondary | ICD-10-CM | POA: Diagnosis not present

## 2023-02-26 DIAGNOSIS — K6289 Other specified diseases of anus and rectum: Secondary | ICD-10-CM

## 2023-02-26 DIAGNOSIS — K5730 Diverticulosis of large intestine without perforation or abscess without bleeding: Secondary | ICD-10-CM

## 2023-02-26 DIAGNOSIS — Z83719 Family history of colon polyps, unspecified: Secondary | ICD-10-CM | POA: Diagnosis not present

## 2023-02-26 DIAGNOSIS — K648 Other hemorrhoids: Secondary | ICD-10-CM

## 2023-02-26 DIAGNOSIS — I10 Essential (primary) hypertension: Secondary | ICD-10-CM | POA: Diagnosis not present

## 2023-02-26 DIAGNOSIS — Z800 Family history of malignant neoplasm of digestive organs: Secondary | ICD-10-CM

## 2023-02-26 DIAGNOSIS — E785 Hyperlipidemia, unspecified: Secondary | ICD-10-CM | POA: Diagnosis not present

## 2023-02-26 DIAGNOSIS — K644 Residual hemorrhoidal skin tags: Secondary | ICD-10-CM | POA: Diagnosis not present

## 2023-02-26 MED ORDER — SODIUM CHLORIDE 0.9 % IV SOLN: 500.0000 mL | Freq: Once | INTRAVENOUS | Status: DC

## 2023-02-26 NOTE — Op Note (Signed)
 [facility] Patient Name: Ronnie Black Procedure Date: 02/26/2023 10:12 AM MRN: [id number] Endoscopist: Ronnie P. Leigh , MD, [id number] Age: 78 Referring MD:  Date of Birth: 1945-09-09 Gender: Male Account #: [id number] Procedure:                Colonoscopy Indications:              Screening patient at increased risk: Family history                            of colorectal cancer in multiple 1st-degree                            relatives (mother dx age 69s, father dx age 83s),                            last exam 2019 Medicines:                Monitored Anesthesia Care Procedure:                Pre-Anesthesia Assessment:                           - Prior to the procedure, a History and Physical                            was performed, and patient medications and                            allergies were reviewed. The patient's tolerance of                            previous anesthesia was also reviewed. The risks                            and benefits of the procedure and the sedation                            options and risks were discussed with the patient.                            All questions were answered, and informed consent                            was obtained. Prior Anticoagulants: The patient has                            taken Eliquis  (apixaban ), last dose was 2 days                            prior to procedure. ASA Grade Assessment: III - A                            patient with severe systemic disease. After  reviewing the risks and benefits, the patient was                            deemed in satisfactory condition to undergo the                            procedure.                           After obtaining informed consent, the colonoscope                            was passed under direct vision. Throughout the                            procedure, the patient's blood pressure, pulse, and                             oxygen saturations were monitored continuously. The                            [id number]-HQ190L Colonoscope [id number] was introduced                            through the anus and advanced to the the cecum,                            identified by appendiceal orifice and ileocecal                            valve. The colonoscopy was performed without                            difficulty. The patient tolerated the procedure                            well. The quality of the bowel preparation was                            adequate. The ileocecal valve, appendiceal orifice,                            and rectum were photographed. Scope In: 10:18:06 AM Scope Out: 10:36:42 AM Scope Withdrawal Time: 0 hours 14 minutes 3 seconds  Total Procedure Duration: 0 hours 18 minutes 36 seconds  Findings:                 Small skin tags were found on perianal exam.                           A 2 mm polyp was found in the ascending colon. The                            polyp was sessile. The polyp was removed with a  cold snare. Resection and retrieval were complete.                           A few small-mouthed diverticula were found in the                            sigmoid colon.                           An area of erythematous mucosa was found in the                            rectum - could be mild radiation changes (history                            of prostate cancer). No high risk lesions for                            bleeding.                           Internal hemorrhoids were found during                            retroflexion. The hemorrhoids were small.                           The exam was otherwise without abnormality. Complications:            No immediate complications. Estimated blood loss:                            Minimal. Estimated Blood Loss:     Estimated blood loss was minimal. Impression:               - Perianal skin tags found on perianal  exam.                           - One 2 mm polyp in the ascending colon, removed                            with a cold snare. Resected and retrieved.                           - Diverticulosis in the sigmoid colon.                           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                           - Internal hemorrhoids.                           -  The examination was otherwise normal. Recommendation:           - Patient has a contact number available for                            emergencies. The signs and symptoms of potential                            delayed complications were discussed with the                            patient. Return to normal activities tomorrow.                            Written discharge instructions were provided to the                            patient.                           - Resume previous diet.                           - Continue present medications.                           - Resume Eliquis  tomorrow.                           - Await pathology results.                           - No further colonoscopy exams recommended given no                            high risk lesions on this exam and the patient's                            age (he would not next be due until age 59) Ronnie Black. Ronnie Jeffries, MD 02/26/2023 10:43:05 AM This report has been signed electronically.

## 2023-02-26 NOTE — Progress Notes (Signed)
 [facility] History and Physical   Primary Care Physician:  Jodie Lavern CROME, MD   Reason for Procedure:   Family history of colon cancer  Plan:    colonoscopy     HPI: Ronnie Black is a 78 y.o. male  here for colonoscopy  surveillance - strong family history of colon cancer - mother dx age 17s and father age 35s. LAst exam 2019. He wishes to have a colonoscopy at this age given he is otherwise in good health. History of AF, Eliquis  held for 2 days prior to the exam.   Patient denies any bowel symptoms at this time.  Otherwise feels well without any cardiopulmonary symptoms.   I have discussed risks / benefits of anesthesia and endoscopic procedure with Ronnie Black and they wish to proceed with the exams as outlined today.    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   Persistent atrial fibrillation (HCC)    s/p PVI 3/12   Pneumonia    Prostate cancer (HCC) 05/08/2021   04/2021    Past Surgical History:  Procedure Laterality Date   APPENDECTOMY  1970   atrial fibrillation ablation  03/27/2010   PVI with CTI ablation by JA   ATRIAL FIBRILLATION ABLATION N/A 11/21/2016   Procedure: ATRIAL FIBRILLATION ABLATION;  Surgeon: Kelsie Agent, MD;  Location: [facility] INVASIVE CV LAB;  Service: Cardiovascular;  Laterality: N/A;   CARDIOVERSION     CARDIOVERSION  04/26/2011   Procedure: CARDIOVERSION;  Surgeon: Ezra GORMAN Shuck, MD;  Location: [facility] ENDOSCOPY;  Service: Cardiovascular;  Laterality: N/A;   CARDIOVERSION N/A 04/28/2012   Procedure: CARDIOVERSION;  Surgeon: Aleene JINNY Passe, MD;  Location: [facility] ENDOSCOPY;  Service: Cardiovascular;  Laterality: N/A;   CARDIOVERSION N/A 07/28/2012   Procedure: CARDIOVERSION;  Surgeon: Redell GORMAN Shallow, MD;  Location: [facility] ENDOSCOPY;  Service: Cardiovascular;  Laterality: N/A;   CARDIOVERSION N/A 08/14/2012   Procedure: CARDIOVERSION;  Surgeon: Maude JAYSON Emmer, MD;  Location: [facility] ENDOSCOPY;  Service: Cardiovascular;  Laterality: N/A;   CARDIOVERSION N/A 08/11/2014   Procedure: CARDIOVERSION;  Surgeon: Vina Okey GAILS, MD;  Location: [facility] ENDOSCOPY;  Service: Cardiovascular;  Laterality: N/A;   CARDIOVERSION N/A 08/26/2014   Procedure: CARDIOVERSION;  Surgeon: Jerel Balding, MD;  Location: [facility] ENDOSCOPY;  Service: Cardiovascular;  Laterality: N/A;   CARDIOVERSION N/A 10/10/2016   Procedure: CARDIOVERSION;  Surgeon: Shuck Ezra GORMAN, MD;  Location: [facility] ENDOSCOPY;  Service: Cardiovascular;  Laterality: N/A;   CARDIOVERSION N/A 12/04/2016   Procedure: CARDIOVERSION;  Surgeon: Shuck Ezra GORMAN, MD;  Location: [facility] ENDOSCOPY;  Service: Cardiovascular;  Laterality: N/A;   CARDIOVERSION N/A 06/09/2018   Procedure: CARDIOVERSION;  Surgeon: Maranda Leim DEL, MD;  Location: [facility] ENDOSCOPY;  Service: Cardiovascular;  Laterality: N/A;   CARDIOVERSION N/A 08/17/2019   Procedure: CARDIOVERSION;  Surgeon: Raford Riggs, MD;  Location: [facility] ENDOSCOPY;  Service: Cardiovascular;  Laterality: N/A;   CARDIOVERSION N/A 11/18/2019   Procedure: CARDIOVERSION;  Surgeon: Hobart Powell BRAVO, MD;  Location: [facility] ENDOSCOPY;  Service: Cardiovascular;  Laterality: N/A;   CARDIOVERSION N/A 03/09/2020   Procedure: CARDIOVERSION;  Surgeon: Jeffrie Oneil JAYSON, MD;  Location: [facility] ENDOSCOPY;  Service: Cardiovascular;  Laterality: N/A;   HIP ARTHROSCOPY  RADIOACTIVE SEED IMPLANT N/A 08/06/2021   Procedure: RADIOACTIVE SEED IMPLANT/BRACHYTHERAPY IMPLANT;  Surgeon: Matilda Senior, MD;  Location: [facility];  Service: Urology;  Laterality: N/A;   SKIN CANCER EXCISION     SPACE OAR INSTILLATION N/A 08/06/2021   Procedure: SPACE OAR INSTILLATION;  Surgeon: Matilda Senior, MD;  Location: [facility];  Service: Urology;  Laterality: N/A;   TEE WITHOUT CARDIOVERSION  04/26/2011   Procedure: TRANSESOPHAGEAL ECHOCARDIOGRAM (TEE);  Surgeon: Ezra GORMAN Shuck, MD;  Location: [facility] ENDOSCOPY;  Service: Cardiovascular;  Laterality: N/A;  to be done at 1330   TEE WITHOUT CARDIOVERSION N/A 04/28/2012   Procedure: TRANSESOPHAGEAL ECHOCARDIOGRAM (TEE);  Surgeon: Aleene JINNY Passe, MD;  Location: [facility] ENDOSCOPY;  Service: Cardiovascular;  Laterality: N/A;   TOTAL HIP ARTHROPLASTY Right 04/12/2022   Procedure: RIGHT TOTAL HIP ARTHROPLASTY ANTERIOR APPROACH;  Surgeon: Vernetta Lonni GRADE, MD;  Location: [facility] ORS;  Service: Orthopedics;  Laterality: Right;   WISDOM TOOTH EXTRACTION     in 1960's per pt    Prior to Admission medications   Medication Sig Start Date End Date Taking? Authorizing Provider  atorvastatin  (LIPITOR) 10 MG tablet Take 1 tablet (10 mg total) by mouth daily. 02/19/23  Yes Cindie Ole DASEN, MD  dofetilide  (TIKOSYN ) 500 MCG capsule Take 1 capsule (500 mcg total) by mouth 2 (two) times daily. 11/26/22  Yes Cindie Ole DASEN, MD  losartan  (COZAAR ) 25 MG tablet Take 1 tablet (25 mg total) by mouth daily. 12/24/22 12/24/23 Yes Fenton, Clint R, PA  melatonin 5 MG TABS Take 5 mg by mouth at bedtime.   Yes [provider]  metoprolol  tartrate (LOPRESSOR ) 25 MG tablet Take 1 tablet (25 mg total) by mouth 2 (two) times daily. 12/17/22  Yes Cindie Ole DASEN, MD  Multiple Vitamin (MULTIVITAMIN WITH MINERALS) TABS tablet Take 1 tablet by mouth 4 (four) times a week.   Yes [provider]  tamsulosin  (FLOMAX ) 0.4 MG CAPS capsule Take 1 capsule (0.4 mg total) by mouth daily. 12/27/22  Yes   amoxicillin  (AMOXIL ) 500 MG capsule Take 2 capsules by mouth now, then take 1 capsules by mouth three times daily until gone. Patient not taking: Reported on 02/26/2023 01/07/23     apixaban  (ELIQUIS ) 5 MG TABS tablet Take 1 tablet (5 mg total) by mouth 2 (two) times daily. 04/29/22   Cindie Ole DASEN, MD  clindamycin  (CLEOCIN ) 300 MG capsule Take 1 capsule (300 mg total) by mouth 3 (three) times daily until gone for dental infection. 02/13/23     fluticasone  (CUTIVATE ) 0.005 % ointment Apply 1 Application topically as needed. 12/06/21   Jodie Lavern CROME, MD  mirabegron  ER (MYRBETRIQ ) 25 MG TB24 tablet Take 1 tablet (25 mg total) by mouth daily. Patient not taking: Reported on 02/26/2023 12/03/22     sodium chloride  (OCEAN) 0.65 % SOLN nasal spray Place 1 spray into both nostrils as needed (dryness).    [provider]    Current Outpatient Medications  Medication Sig Dispense Refill   atorvastatin  (LIPITOR) 10 MG tablet Take 1 tablet (10 mg total) by mouth daily. 90 tablet 3   dofetilide  (TIKOSYN ) 500 MCG capsule Take 1 capsule (500 mcg total) by mouth 2 (two) times daily. 180 capsule 3   losartan  (COZAAR ) 25 MG tablet Take 1 tablet (25 mg total) by mouth daily. 90 tablet 2   melatonin 5 MG TABS Take 5 mg by mouth at bedtime.     metoprolol  tartrate (LOPRESSOR ) 25  MG tablet Take 1 tablet (25 mg total) by mouth 2 (two) times daily. 180 tablet 0   Multiple Vitamin (MULTIVITAMIN WITH MINERALS) TABS tablet Take 1 tablet by mouth 4 (four) times a week.     tamsulosin  (FLOMAX ) 0.4 MG CAPS capsule Take 1 capsule (0.4 mg total) by mouth daily. 90 capsule 1   amoxicillin  (AMOXIL ) 500 MG capsule Take 2 capsules by mouth now, then take 1 capsules by mouth three times daily until gone. (Patient not taking: Reported on 02/26/2023) 30 capsule 1   apixaban  (ELIQUIS ) 5 MG TABS tablet Take 1 tablet (5 mg total) by mouth 2 (two) times daily. 180 tablet 3   clindamycin  (CLEOCIN ) 300 MG capsule Take 1 capsule (300 mg total) by mouth 3 (three) times daily until gone for dental infection. 21 capsule 1   fluticasone  (CUTIVATE ) 0.005 % ointment Apply 1 Application topically as needed.     mirabegron  ER (MYRBETRIQ ) 25 MG TB24 tablet Take 1 tablet (25 mg total) by mouth daily. (Patient not taking:  Reported on 02/26/2023) 30 tablet 5   sodium chloride  (OCEAN) 0.65 % SOLN nasal spray Place 1 spray into both nostrils as needed (dryness).     Current Facility-Administered Medications  Medication Dose Route Frequency Provider Last Rate Last Admin   0.9 %  sodium chloride  infusion  500 mL Intravenous Once Lanita Stammen, Elspeth SQUIBB, MD        Allergies as of 02/26/2023   (No Known Allergies)    Family History  Problem Relation Age of Onset   Colon cancer Mother    Lung cancer Mother    Colon cancer Father    Coronary artery disease Other    Liver disease Neg Hx    Esophageal cancer Neg Hx     Social History   Socioeconomic History   Marital status: Widowed    Spouse name: Not on file   Number of children: 2   Years of education: Not on file   Highest education level: Not on file  Occupational History   Occupation: professor    Employer: [employer]    Comment: retired    Occupation: retired  Tobacco Use   Smoking status: Former    Current packs/day: 0.00    Average packs/day: 0.5 packs/day for 15.0 years (7.5 ttl pk-yrs)    Types: Cigarettes    Start date: 11/27/1960    Quit date: 11/28/1975    Years since quitting: 47.2   Smokeless tobacco: Never  Vaping Use   Vaping status: Never Used  Substance and Sexual Activity   Alcohol use: Yes    Alcohol/week: 6.0 standard drinks of alcohol    Types: 3 Glasses of wine, 3 Cans of beer per week    Comment: social   Drug use: No   Sexual activity: Yes  Other Topics Concern   Not on file  Social History Narrative    a professor of investment banker, 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[place] @ [employer]    Social Drivers of Health   Financial Resource Strain: Low Risk  (06/09/2020)   Overall Financial Resource Strain (CARDIA)    Difficulty of Paying Living Expenses: Not hard at all  Food Insecurity: No Food Insecurity (04/12/2022)   Hunger Vital Sign    Worried About Running Out of Food in the Last Year: Never  true    Ran Out of Food in the Last Year: Never true  Transportation Needs: No Transportation Needs (04/12/2022)  PRAPARE - Administrator, Civil Service (Medical): No    Lack of Transportation (Non-Medical): No  Physical Activity: Sufficiently Active (06/09/2020)   Exercise Vital Sign    Days of Exercise per Week: 6 days    Minutes of Exercise per Session: 60 min  Stress: No Stress Concern Present (06/09/2020)   [employer] of Occupational Health - Occupational Stress Questionnaire    Feeling of Stress : Not at all  Social Connections: Unknown (05/31/2021)   Received from [facility]   Social Network    Social Network: Not on file  Intimate Partner Violence: Not At Risk (04/12/2022)   Humiliation, Afraid, Rape, and Kick questionnaire    Fear of Current or Ex-Partner: No    Emotionally Abused: No    Physically Abused: No    Sexually Abused: No    Review of Systems: All other review of systems negative except as mentioned in the HPI.  Physical Exam: Vital signs BP 134/77   Pulse 79   Temp (!) 97.2 F (36.2 C) (Temporal)   Ht 5' 6 (1.676 m)   Wt 154 lb (69.9 kg)   SpO2 97%   BMI 24.86 kg/m   General:   Alert,  Well-developed, pleasant and cooperative in NAD Lungs:  Clear throughout to auscultation.   Heart:  Regular rate and rhythm Abdomen:  Soft, nontender and nondistended.   Neuro/Psych:  Alert and cooperative. Normal mood and affect. A and O x 3  Marcey Naval, MD [facility]

## 2023-02-26 NOTE — Progress Notes (Signed)
 Pt sedate, gd SR's, VSS, report to RN

## 2023-02-26 NOTE — Progress Notes (Signed)
 Called to room to assist during endoscopic procedure.  Patient ID and intended procedure confirmed with present staff. Received instructions for my participation in the procedure from the performing physician.

## 2023-02-26 NOTE — Patient Instructions (Signed)
 Please read handouts provided. Continue present medications. Await pathology results. Resume Eliquis  tomorrow. Resume previous diet.   YOU HAD AN ENDOSCOPIC PROCEDURE TODAY AT THE [facility]:   Refer to the procedure report that was given to you for any specific questions about what was found during the examination.  If the procedure report does not answer your questions, please call your gastroenterologist to clarify.  If you requested that your care partner not be given the details of your procedure findings, then the procedure report has been included in a sealed envelope for you to review at your convenience later.  YOU SHOULD EXPECT: Some feelings of bloating in the abdomen. Passage of more gas than usual.  Walking can help get rid of the air that was put into your GI tract during the procedure and reduce the bloating. If you had a lower endoscopy (such as a colonoscopy or flexible sigmoidoscopy) you may notice spotting of blood in your stool or on the toilet paper. If you underwent a bowel prep for your procedure, you may not have a normal bowel movement for a few days.  Please Note:  You might notice some irritation and congestion in your nose or some drainage.  This is from the oxygen used during your procedure.  There is no need for concern and it should clear up in a day or so.  SYMPTOMS TO REPORT IMMEDIATELY:  Following lower endoscopy (colonoscopy or flexible sigmoidoscopy):  Excessive amounts of blood in the stool  Significant tenderness or worsening of abdominal pains  Swelling of the abdomen that is new, acute  Fever of 100F or higher.  For urgent or emergent issues, a gastroenterologist can be reached at any hour by calling [phone]. Do not use MyChart messaging for urgent concerns.    DIET:  We do recommend a small meal at first, but then you may proceed to your regular diet.  Drink plenty of fluids but you should avoid alcoholic beverages for 24  hours.  ACTIVITY:  You should plan to take it easy for the rest of today and you should NOT DRIVE or use heavy machinery until tomorrow (because of the sedation medicines used during the test).    FOLLOW UP: Our staff will call the number listed on your records the next business day following your procedure.  We will call around 7:15- 8:00 am to check on you and address any questions or concerns that you may have regarding the information given to you following your procedure. If we do not reach you, we will leave a message.     If any biopsies were taken you will be contacted by phone or by letter within the next 1-3 weeks.  Please call us  at [phone] if you have not heard about the biopsies in 3 weeks.    SIGNATURES/CONFIDENTIALITY: You and/or your care partner have signed paperwork which will be entered into your electronic medical record.  These signatures attest to the fact that that the information above on your After Visit Summary has been reviewed and is understood.  Full responsibility of the confidentiality of this discharge information lies with you and/or your care-partner.

## 2023-02-27 ENCOUNTER — Telehealth: Payer: Self-pay

## 2023-02-27 NOTE — Telephone Encounter (Signed)
  Follow up Call-     02/26/2023    9:06 AM  Call back number  Post procedure Call Back phone  # [phone]  Permission to leave phone message Yes     Patient questions:  Do you have a fever, pain , or abdominal swelling? No. Pain Score  0 *  Have you tolerated food without any problems? Yes.    Have you been able to return to your normal activities? Yes.    Do you have any questions about your discharge instructions: Diet   No. Medications  No. Follow up visit  No.  Do you have questions or concerns about your Care? No.  Actions: * If pain score is 4 or above: No action needed, pain <4.

## 2023-03-01 ENCOUNTER — Other Ambulatory Visit (HOSPITAL_BASED_OUTPATIENT_CLINIC_OR_DEPARTMENT_OTHER): Payer: Self-pay

## 2023-03-03 ENCOUNTER — Encounter: Payer: Self-pay | Admitting: Gastroenterology

## 2023-03-03 LAB — SURGICAL PATHOLOGY

## 2023-03-04 ENCOUNTER — Other Ambulatory Visit (HOSPITAL_BASED_OUTPATIENT_CLINIC_OR_DEPARTMENT_OTHER): Payer: Self-pay

## 2023-03-10 ENCOUNTER — Other Ambulatory Visit (HOSPITAL_BASED_OUTPATIENT_CLINIC_OR_DEPARTMENT_OTHER): Payer: Self-pay

## 2023-03-18 ENCOUNTER — Other Ambulatory Visit (HOSPITAL_BASED_OUTPATIENT_CLINIC_OR_DEPARTMENT_OTHER): Payer: Self-pay

## 2023-03-18 DIAGNOSIS — Z85828 Personal history of other malignant neoplasm of skin: Secondary | ICD-10-CM | POA: Diagnosis not present

## 2023-03-18 DIAGNOSIS — I788 Other diseases of capillaries: Secondary | ICD-10-CM | POA: Diagnosis not present

## 2023-03-18 DIAGNOSIS — L820 Inflamed seborrheic keratosis: Secondary | ICD-10-CM | POA: Diagnosis not present

## 2023-03-18 DIAGNOSIS — L812 Freckles: Secondary | ICD-10-CM | POA: Diagnosis not present

## 2023-03-18 DIAGNOSIS — D1801 Hemangioma of skin and subcutaneous tissue: Secondary | ICD-10-CM | POA: Diagnosis not present

## 2023-03-18 DIAGNOSIS — L570 Actinic keratosis: Secondary | ICD-10-CM | POA: Diagnosis not present

## 2023-03-18 MED ORDER — RHOFADE 1 % EX CREA
TOPICAL_CREAM | CUTANEOUS | 2 refills | Status: DC
  Filled 2023-03-18 (×2): qty 30, 30d supply, fill #0

## 2023-04-09 ENCOUNTER — Other Ambulatory Visit (HOSPITAL_BASED_OUTPATIENT_CLINIC_OR_DEPARTMENT_OTHER): Payer: Self-pay

## 2023-04-09 ENCOUNTER — Other Ambulatory Visit: Payer: Self-pay | Admitting: Cardiology

## 2023-04-09 ENCOUNTER — Other Ambulatory Visit: Payer: Self-pay

## 2023-04-09 MED ORDER — METOPROLOL TARTRATE 25 MG PO TABS
25.0000 mg | ORAL_TABLET | Freq: Two times a day (BID) | ORAL | 1 refills | Status: DC
  Filled 2023-04-09: qty 180, 90d supply, fill #0
  Filled 2023-07-05: qty 180, 90d supply, fill #1

## 2023-04-25 ENCOUNTER — Other Ambulatory Visit (HOSPITAL_BASED_OUTPATIENT_CLINIC_OR_DEPARTMENT_OTHER): Payer: Self-pay

## 2023-04-25 MED ORDER — COMIRNATY 30 MCG/0.3ML IM SUSY
0.3000 mL | PREFILLED_SYRINGE | Freq: Once | INTRAMUSCULAR | 0 refills | Status: AC
  Filled 2023-04-25: qty 0.3, 1d supply, fill #0

## 2023-05-07 DIAGNOSIS — C61 Malignant neoplasm of prostate: Secondary | ICD-10-CM | POA: Diagnosis not present

## 2023-05-09 ENCOUNTER — Other Ambulatory Visit: Payer: Self-pay | Admitting: Cardiology

## 2023-05-09 ENCOUNTER — Other Ambulatory Visit (HOSPITAL_BASED_OUTPATIENT_CLINIC_OR_DEPARTMENT_OTHER): Payer: Self-pay

## 2023-05-09 DIAGNOSIS — I4819 Other persistent atrial fibrillation: Secondary | ICD-10-CM

## 2023-05-09 MED ORDER — APIXABAN 5 MG PO TABS
5.0000 mg | ORAL_TABLET | Freq: Two times a day (BID) | ORAL | 3 refills | Status: AC
  Filled 2023-05-09: qty 180, 90d supply, fill #0
  Filled 2023-08-05: qty 180, 90d supply, fill #1
  Filled 2023-11-01: qty 180, 90d supply, fill #2
  Filled 2024-02-01: qty 180, 90d supply, fill #3

## 2023-05-14 DIAGNOSIS — C61 Malignant neoplasm of prostate: Secondary | ICD-10-CM | POA: Diagnosis not present

## 2023-05-14 DIAGNOSIS — N130 Hydronephrosis with ureteropelvic junction obstruction: Secondary | ICD-10-CM | POA: Diagnosis not present

## 2023-05-14 DIAGNOSIS — N5201 Erectile dysfunction due to arterial insufficiency: Secondary | ICD-10-CM | POA: Diagnosis not present

## 2023-05-14 DIAGNOSIS — R3914 Feeling of incomplete bladder emptying: Secondary | ICD-10-CM | POA: Diagnosis not present

## 2023-05-14 DIAGNOSIS — N401 Enlarged prostate with lower urinary tract symptoms: Secondary | ICD-10-CM | POA: Diagnosis not present

## 2023-05-26 ENCOUNTER — Other Ambulatory Visit: Payer: Self-pay

## 2023-05-26 ENCOUNTER — Other Ambulatory Visit (HOSPITAL_BASED_OUTPATIENT_CLINIC_OR_DEPARTMENT_OTHER): Payer: Self-pay

## 2023-05-27 ENCOUNTER — Other Ambulatory Visit (HOSPITAL_BASED_OUTPATIENT_CLINIC_OR_DEPARTMENT_OTHER): Payer: Self-pay

## 2023-05-29 ENCOUNTER — Other Ambulatory Visit (HOSPITAL_BASED_OUTPATIENT_CLINIC_OR_DEPARTMENT_OTHER): Payer: Self-pay

## 2023-05-30 ENCOUNTER — Ambulatory Visit: Payer: Self-pay

## 2023-05-30 ENCOUNTER — Ambulatory Visit: Admitting: Internal Medicine

## 2023-05-30 ENCOUNTER — Other Ambulatory Visit (HOSPITAL_BASED_OUTPATIENT_CLINIC_OR_DEPARTMENT_OTHER): Payer: Self-pay

## 2023-05-30 ENCOUNTER — Encounter: Payer: Self-pay | Admitting: Internal Medicine

## 2023-05-30 ENCOUNTER — Other Ambulatory Visit (HOSPITAL_COMMUNITY): Payer: Self-pay

## 2023-05-30 VITALS — BP 100/64 | HR 81 | Temp 97.7°F | Ht 66.0 in | Wt 154.0 lb

## 2023-05-30 DIAGNOSIS — K5730 Diverticulosis of large intestine without perforation or abscess without bleeding: Secondary | ICD-10-CM

## 2023-05-30 DIAGNOSIS — A059 Bacterial foodborne intoxication, unspecified: Secondary | ICD-10-CM | POA: Diagnosis not present

## 2023-05-30 LAB — CBC WITH DIFFERENTIAL/PLATELET
Basophils Absolute: 0 10*3/uL (ref 0.0–0.1)
Basophils Relative: 0.2 % (ref 0.0–3.0)
Eosinophils Absolute: 0 10*3/uL (ref 0.0–0.7)
Eosinophils Relative: 0.3 % (ref 0.0–5.0)
HCT: 43.3 % (ref 39.0–52.0)
Hemoglobin: 14.4 g/dL (ref 13.0–17.0)
Lymphocytes Relative: 16.3 % (ref 12.0–46.0)
Lymphs Abs: 1 10*3/uL (ref 0.7–4.0)
MCHC: 33.3 g/dL (ref 30.0–36.0)
MCV: 87.9 fl (ref 78.0–100.0)
Monocytes Absolute: 0.6 10*3/uL (ref 0.1–1.0)
Monocytes Relative: 9.2 % (ref 3.0–12.0)
Neutro Abs: 4.7 10*3/uL (ref 1.4–7.7)
Neutrophils Relative %: 74 % (ref 43.0–77.0)
Platelets: 173 10*3/uL (ref 150.0–400.0)
RBC: 4.93 Mil/uL (ref 4.22–5.81)
RDW: 13.3 % (ref 11.5–15.5)
WBC: 6.3 10*3/uL (ref 4.0–10.5)

## 2023-05-30 LAB — COMPREHENSIVE METABOLIC PANEL WITH GFR
ALT: 23 U/L (ref 0–53)
AST: 23 U/L (ref 0–37)
Albumin: 4.3 g/dL (ref 3.5–5.2)
Alkaline Phosphatase: 39 U/L (ref 39–117)
BUN: 16 mg/dL (ref 6–23)
CO2: 25 meq/L (ref 19–32)
Calcium: 8.6 mg/dL (ref 8.4–10.5)
Chloride: 95 meq/L — ABNORMAL LOW (ref 96–112)
Creatinine, Ser: 0.93 mg/dL (ref 0.40–1.50)
GFR: 77.83 mL/min (ref >60.00)
Glucose, Bld: 108 mg/dL — ABNORMAL HIGH (ref 70–99)
Potassium: 3.7 meq/L (ref 3.5–5.1)
Sodium: 128 meq/L — ABNORMAL LOW (ref 135–145)
Total Bilirubin: 0.8 mg/dL (ref 0.2–1.2)
Total Protein: 6.5 g/dL (ref 6.0–8.3)

## 2023-05-30 MED ORDER — CEFIXIME 100 MG/5ML PO SUSR
400.0000 mg | Freq: Every day | ORAL | 5 refills | Status: DC
Start: 2023-05-30 — End: 2023-06-04
  Filled 2023-05-30: qty 50, 2d supply, fill #0

## 2023-05-30 MED ORDER — CEFIXIME 400 MG PO CAPS
400.0000 mg | ORAL_CAPSULE | Freq: Every day | ORAL | 0 refills | Status: DC
  Filled 2023-05-30 (×2): qty 5, 5d supply, fill #0

## 2023-05-30 NOTE — Telephone Encounter (Signed)
 Chief Complaint: diarrhea Symptoms: severe diarrhea, nausea, abdominal bloating (resolved), sore throat, acid reflux Frequency: x 2 days Pertinent Negatives: Patient denies vomiting, fever, dry mouth, too weak to stand Disposition: [] ED /[] Urgent Care (no appt availability in office) / [x] Appointment(In office/virtual)/ []  [place] [facility]/ [] Home Care/ [] Refused Recommended Disposition /[] [place] [facility]/ []  Follow-up with PCP Additional Notes: Patient states he thinks this is food borne illness, he states the symptoms started shortly after dinner on Wednesday night. No available appts with PCP, patient states he prefers to see a doctor. Scheduled with Dr Boston Byers for this afternoon. Patient verbalizes understanding to call back for new or worsening symptoms.  Copied from CRM [id number]. Topic: Clinical - Red Word Triage >> May 30, 2023  8:39 AM Deaijah H wrote: Red Word that prompted transfer to Nurse Triage: Food poisoning / diarrhea hasn't gotten any better Reason for Disposition  [1] SEVERE diarrhea (e.g., 7 or more times / day more than normal) AND [2] age > 60 years  Answer Assessment - Initial Assessment Questions 1. DIARRHEA SEVERITY: "How bad is the diarrhea?" "How many more stools have you had in the past 24 hours than normal?"    - NO DIARRHEA (SCALE 0)   - MILD (SCALE 1-3): Few loose or mushy BMs; increase of 1-3 stools over normal daily number of stools; mild increase in ostomy output.   -  MODERATE (SCALE 4-7): Increase of 4-6 stools daily over normal; moderate increase in ostomy output.   -  SEVERE (SCALE 8-10; OR "WORST POSSIBLE"): Increase of 7 or more stools daily over normal; moderate increase in ostomy output; incontinence.     15-20 time, severe. He states this morning he has already gone 4-5 times. Baseline is 3-4 bowel movements daily.  2. ONSET: "When did the diarrhea begin?"      Wednesday evening, after dinner.  3. BM CONSISTENCY: "How loose or watery  is the diarrhea?"      Varies, pretty watery sometimes loose.  4. VOMITING: "Are you also vomiting?" If Yes, ask: "How many times in the past 24 hours?"      Denies.  5. ABDOMEN PAIN: "Are you having any abdomen pain?" If Yes, ask: "What does it feel like?" (e.g., crampy, dull, intermittent, constant)      Denies.  6. ABDOMEN PAIN SEVERITY: If present, ask: "How bad is the pain?"  (e.g., Scale 1-10; mild, moderate, or severe)   - MILD (1-3): doesn't interfere with normal activities, abdomen soft and not tender to touch    - MODERATE (4-7): interferes with normal activities or awakens from sleep, abdomen tender to touch    - SEVERE (8-10): excruciating pain, doubled over, unable to do any normal activities       0/10.  7. ORAL INTAKE: If vomiting, "Have you been able to drink liquids?" "How much liquids have you had in the past 24 hours?"     Yes, he states a "fair amount". 8 glasses of water  yesterday, water  with electrolytes and Gatorade.  8. HYDRATION: "Any signs of dehydration?" (e.g., dry mouth [not just dry lips], too weak to stand, dizziness, new weight loss) "When did you last urinate?"     Denies signs of dehydration. He states he has urinated this morning.  9. EXPOSURE: "Have you traveled to a foreign country recently?" "Have you been exposed to anyone with diarrhea?" "Could you have eaten any food that was spoiled?"     He states he is pretty sure it was  something spoiled for dinner. He states during dinner on Wednesday he felt bloated and it was pretty soon after that he had diarrhea.  10. ANTIBIOTIC USE: "Are you taking antibiotics now or have you taken antibiotics in the past 2 months?"       Denies.  11. OTHER SYMPTOMS: "Do you have any other symptoms?" (e.g., fever, blood in stool)       Nausea/ "acid reflux", sore throat, abdominal bloating (Wednesday and resolved Thursday), fatigue  12. PREGNANCY: "Is there any chance you are pregnant?" "When was your last menstrual  period?"       N/A.  Protocols used: [facility]

## 2023-05-30 NOTE — Patient Instructions (Addendum)
??   After Visit Summary (AVS) Diagnosis: Acute bacterial gastroenteritis, suspected foodborne (nursing home salad exposure)  ?? Medications Prescribed: Cefixime 400 mg tablet Take 1 tablet (400 mg) by mouth once daily for 5 days. Take with or without food. If you experience stomach upset, it may help to take it with food. Do not take antacids or calcium /iron supplements within 2 hours of cefixime, as they may reduce its effectiveness.  ?? Home Care Instructions: ?? Hydration & Fluid Management: Drink plenty of fluids to prevent dehydration. Aim for 8-10 glasses of clear liquids daily such as: Water  Oral rehydration solution (e.g., Pedialyte) Broth Diluted sports drinks (like Gatorade) Avoid caffeine, alcohol, and sugary drinks, as they can worsen diarrhea. Take small, frequent sips if drinking a full glass is difficult. Take electrolytes with every drink ?? Diet: Follow a BRAT diet until your symptoms improve: Bananas Rice Applesauce Toast Avoid raw vegetables, salads, dairy products, spicy foods, and greasy foods until fully recovered. Gradually reintroduce regular foods over 48-72 hours as tolerated.  ?? What to Watch For: Call the clinic or go to the ER if you have: Signs of dehydration (dry mouth, decreased urination, dizziness) Persistent high fever (>101F) Bloody stools or black, tarry stools Rapid heartbeat or lightheadedness New or worsening abdominal pain  ?? Other Important Notes: You were not given Zofran  (ondansetron ) due to potential interaction with your heart rhythm medication (dofetilide ). If nausea becomes a problem, try ginger tea or peppermint. You may use over-the-counter loperamide (Imodium) only if directed by your doctor and not if stools are bloody or you have a fever.  ?? Follow-up: Follow up with your primary care provider or GI specialist in 3-5 days, or sooner if symptoms worsen. A stool sample may be recommended to confirm the infection  type.

## 2023-05-30 NOTE — Progress Notes (Unsigned)
 ==============================  [facility]: [phone]   -- Medical Office Visit --  Patient: Ronnie Black      Age: 78 y.o.       Sex:  male  Date:   05/30/2023 Today's Healthcare Provider: Anthon Kins, MD  ==============================   Chief Complaint: Diarrhea (Pt states he thinks it is food poisoning has had nausea and a lot of diarrhea since wednesday night . Has had some heart burn as well. Has tried otc not helping.)  Discussed the use of AI scribe software for clinical note transcription with the patient, who gave verbal consent to proceed.  History of Present Illness year old male who presents with severe diarrhea and gastrointestinal symptoms following a meal.  He began experiencing acid reflux and nausea on Wednesday night after dinner, with symptoms peaking that night. Although he did not vomit, he felt an uneasy sensation in his stomach. The symptoms started approximately one to two hours after consuming a shrimp cocktail and salad. He felt bloated during the meal, which was unusual for him.  He has been experiencing severe diarrhea, initially occurring every half hour on Friday morning, but the frequency has decreased slightly. He describes his intestines as 'very angry' and 'super hyperactive'. He has been taking Imodium to manage the diarrhea and has not started any new medications recently. No fever, blood in the stool, or abdominal pain, although he mentions slight tenderness in the abdomen without a specific tender spot.  He suspects food poisoning, particularly from the salad, as he felt bloated after eating it. No one else who ate with him experienced similar symptoms.  He has a history of minor diverticulosis, confirmed by a colonoscopy a month ago, which also showed small polyps and hemorrhoids. 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 He  is due for a urology study in July.  He is scheduled to travel to [place] on Tuesday for a two-week vacation and is concerned about his ability to travel if symptoms persist. He has been maintaining hydration with water  and electrolytes, noting that he has been drinking lots of water  and using electrolyte powders.   has Mixed hyperlipidemia; HOCM (hypertrophic obstructive cardiomyopathy) (HCC); CAROTID BRUIT; Benign prostatic hyperplasia without lower urinary tract symptoms; Persistent atrial fibrillation [facility]); ED (erectile dysfunction); Family history of colon cancer; Herniated lumbar disc without myelopathy; Actinic keratosis; Seborrheic keratosis; Atrial fibrillation status post cardioversion [facility]); Current use of long term anticoagulation; Essential hypertension; Bilateral primary osteoarthritis of hip; Malignant neoplasm of prostate (HCC); Aortic atherosclerosis (HCC); Hypercoagulable state due to persistent atrial fibrillation (HCC); and Status post total replacement of right hip on their problem list. Current Outpatient Medications on File Prior to Visit  Medication Sig Dispense Refill   apixaban  (ELIQUIS ) 5 MG TABS tablet Take 1 tablet (5 mg total) by mouth 2 (two) times daily. 180 tablet 3   atorvastatin  (LIPITOR) 10 MG tablet Take 1 tablet (10 mg total) by mouth daily. 90 tablet 3   dofetilide  (TIKOSYN ) 500 MCG capsule Take 1 capsule (500 mcg total) by mouth 2 (two) times daily. 180 capsule 3   fluticasone  (CUTIVATE ) 0.005 % ointment Apply 1 Application topically as needed.     losartan  (COZAAR ) 25 MG tablet Take 1 tablet (25 mg total) by mouth daily. 90 tablet 2   melatonin 5 MG TABS Take 5 mg by mouth at bedtime.     metoprolol   tartrate (LOPRESSOR ) 25 MG tablet Take 1 tablet (25 mg total) by mouth 2 (two) times daily. 180 tablet 1   Multiple Vitamin (MULTIVITAMIN WITH MINERALS) TABS tablet Take 1 tablet by mouth 4 (four) times a week.     Oxymetazoline  HCl (RHOFADE ) 1 % CREA Apply 1 a  small amount to skin once a day 30 g 2   tamsulosin  (FLOMAX ) 0.4 MG CAPS capsule Take 1 capsule (0.4 mg total) by mouth daily. 90 capsule 1   mirabegron  ER (MYRBETRIQ ) 25 MG TB24 tablet Take 1 tablet (25 mg total) by mouth daily. (Patient not taking: Reported on 05/30/2023) 30 tablet 5   sodium chloride  (OCEAN) 0.65 % SOLN nasal spray Place 1 spray into both nostrils as needed (dryness). (Patient not taking: Reported on 05/30/2023)     No current facility-administered medications on file prior to visit.     Background Reviewed: Problem List: has Mixed hyperlipidemia; HOCM (hypertrophic obstructive cardiomyopathy) (HCC); CAROTID BRUIT; Benign prostatic hyperplasia without lower urinary tract symptoms; Persistent atrial fibrillation [facility]); ED (erectile dysfunction); Family history of colon cancer; Herniated lumbar disc without myelopathy; Actinic keratosis; Seborrheic keratosis; Atrial fibrillation status post cardioversion [facility]); Current use of long term anticoagulation; Essential hypertension; Bilateral primary osteoarthritis of hip; Malignant neoplasm of prostate (HCC); Aortic atherosclerosis (HCC); Hypercoagulable state due to persistent atrial fibrillation (HCC); and Status post total replacement of right hip on their problem list. Past Medical History:  has a past medical history of Complication of anesthesia, Current use of long term anticoagulation (11/06/2018), DDD (degenerative disc disease), HLD (hyperlipidemia), Hypertension, Hypertr obst cardiomyop, Persistent atrial fibrillation (HCC), Pneumonia, and Prostate cancer (HCC) (05/08/2021). Past Surgical History:   has a past surgical history that includes Appendectomy (1970); atrial fibrillation ablation (03/27/2010); Wisdom tooth extraction; Skin cancer excision; Cardioversion; TEE without cardioversion (04/26/2011); Cardioversion (04/26/2011); TEE without cardioversion (N/A, 04/28/2012); Cardioversion (N/A, 04/28/2012); Cardioversion (N/A,  07/28/2012); Cardioversion (N/A, 08/14/2012); Cardioversion (N/A, 08/11/2014); Cardioversion (N/A, 08/26/2014); Cardioversion (N/A, 10/10/2016); ATRIAL FIBRILLATION ABLATION (N/A, 11/21/2016); Cardioversion (N/A, 12/04/2016); Cardioversion (N/A, 06/09/2018); Cardioversion (N/A, 08/17/2019); Cardioversion (N/A, 11/18/2019); Cardioversion (N/A, 03/09/2020); Radioactive seed implant (N/A, 08/06/2021); SPACE OAR INSTILLATION (N/A, 08/06/2021); Total hip arthroplasty (Right, 04/12/2022); and Hip arthroscopy. Social History:   reports that he quit smoking about 47 years ago. His smoking use included cigarettes. He started smoking about 62 years ago. He has a 7.5 pack-year smoking history. He has never used smokeless tobacco. He reports current alcohol use of about 6.0 standard drinks of alcohol per week. He reports that he does not use drugs. Family History:  family history includes Colon cancer in his father and mother; Coronary artery disease in an other family member; Lung cancer in his mother. Allergies:  has no known allergies.   Medication Reconciliation: Current Outpatient Medications on File Prior to Visit  Medication Sig   apixaban  (ELIQUIS ) 5 MG TABS tablet Take 1 tablet (5 mg total) by mouth 2 (two) times daily.   atorvastatin  (LIPITOR) 10 MG tablet Take 1 tablet (10 mg total) by mouth daily.   dofetilide  (TIKOSYN ) 500 MCG capsule Take 1 capsule (500 mcg total) by mouth 2 (two) times daily.   fluticasone  (CUTIVATE ) 0.005 % ointment Apply 1 Application topically as needed.   losartan  (COZAAR ) 25 MG tablet Take 1 tablet (25 mg total) by mouth daily.   melatonin 5 MG TABS Take 5 mg by mouth at bedtime.   metoprolol  tartrate (LOPRESSOR ) 25 MG tablet Take 1 tablet (25 mg total) by mouth 2 (two) times daily.  Multiple Vitamin (MULTIVITAMIN WITH MINERALS) TABS tablet Take 1 tablet by mouth 4 (four) times a week.   Oxymetazoline  HCl (RHOFADE ) 1 % CREA Apply 1 a small amount to skin once a day    tamsulosin  (FLOMAX ) 0.4 MG CAPS capsule Take 1 capsule (0.4 mg total) by mouth daily.   mirabegron  ER (MYRBETRIQ ) 25 MG TB24 tablet Take 1 tablet (25 mg total) by mouth daily. (Patient not taking: Reported on 05/30/2023)   sodium chloride  (OCEAN) 0.65 % SOLN nasal spray Place 1 spray into both nostrils as needed (dryness). (Patient not taking: Reported on 05/30/2023)   No current facility-administered medications on file prior to visit.  There are no discontinued medications.   Physical Exam:    05/30/2023   12:59 PM 02/26/2023   11:01 AM 02/26/2023   10:51 AM  Vitals with BMI  Height 5\' 6"     Weight 154 lbs    BMI 24.87    Systolic 100 109 161  Diastolic 64 70 60  Pulse 81 69 76  Vital signs reviewed.  Nursing notes reviewed. Weight trend reviewed. Physical Exam General Appearance:  No acute distress appreciable.   Well-groomed, healthy-appearing male.  Well proportioned with no abnormal fat distribution.  Good muscle tone. Pulmonary:  Normal work of breathing at rest, no respiratory distress apparent. SpO2: 98 %  Musculoskeletal: All extremities are intact.  Neurological:  Awake, alert, oriented, and engaged.  No obvious focal neurological deficits or cognitive impairments.  Sensorium seems unclouded.   Speech is clear and coherent with logical content. Psychiatric:  Appropriate mood, pleasant and cooperative demeanor, thoughtful and engaged during the exam Physical Exam HEENT: Normal oropharynx, tongue examination normal, slightly dry lips. ABDOMEN: Hyperactive bowel sounds, mild abdominal tenderness nonlocalizing, only to deep palpation.     Results for orders placed or performed in visit on 05/30/23  Comp Met (CMET)  Result Value Ref Range   Sodium 128 (L) 135 - 145 mEq/L   Potassium 3.7 3.5 - 5.1 mEq/L   Chloride 95 (L) 96 - 112 mEq/L   CO2 25 19 - 32 mEq/L   Glucose, Bld 108 (H) 70 - 99 mg/dL   BUN 16 6 - 23 mg/dL   Creatinine, Ser 0.96 0.40 - 1.50 mg/dL   Total Bilirubin 0.8  0.2 - 1.2 mg/dL   Alkaline Phosphatase 39 39 - 117 U/L   AST 23 0 - 37 U/L   ALT 23 0 - 53 U/L   Total Protein 6.5 6.0 - 8.3 g/dL   Albumin  4.3 3.5 - 5.2 g/dL   GFR 04.54 >09.81 mL/min   Calcium  8.6 8.4 - 10.5 mg/dL  CBC w/Diff  Result Value Ref Range   WBC 6.3 4.0 - 10.5 K/uL   RBC 4.93 4.22 - 5.81 Mil/uL   Hemoglobin 14.4 13.0 - 17.0 g/dL   HCT 19.1 47.8 - 29.5 %   MCV 87.9 78.0 - 100.0 fl   MCHC 33.3 30.0 - 36.0 g/dL   RDW 62.1 30.8 - 65.7 %   Platelets 173.0 150.0 - 400.0 K/uL   Neutrophils Relative % 74.0 43.0 - 77.0 %   Lymphocytes Relative 16.3 12.0 - 46.0 %   Monocytes Relative 9.2 3.0 - 12.0 %   Eosinophils Relative 0.3 0.0 - 5.0 %   Basophils Relative 0.2 0.0 - 3.0 %   Neutro Abs 4.7 1.4 - 7.7 K/uL   Lymphs Abs 1.0 0.7 - 4.0 K/uL   Monocytes Absolute 0.6 0.1 - 1.0 K/uL  Eosinophils Absolute 0.0 0.0 - 0.7 K/uL   Basophils Absolute 0.0 0.0 - 0.1 K/uL   Office Visit on 05/30/2023  Component Date Value   Sodium 05/30/2023 128 (L)    Potassium 05/30/2023 3.7    Chloride 05/30/2023 95 (L)    CO2 05/30/2023 25    Glucose, Bld 05/30/2023 108 (H)    BUN 05/30/2023 16    Creatinine, Ser 05/30/2023 0.93    Total Bilirubin 05/30/2023 0.8    Alkaline Phosphatase 05/30/2023 39    AST 05/30/2023 23    ALT 05/30/2023 23    Total Protein 05/30/2023 6.5    Albumin  05/30/2023 4.3    GFR 05/30/2023 77.83    Calcium  05/30/2023 8.6    WBC 05/30/2023 6.3    RBC 05/30/2023 4.93    Hemoglobin 05/30/2023 14.4    HCT 05/30/2023 43.3    MCV 05/30/2023 87.9    MCHC 05/30/2023 33.3    RDW 05/30/2023 13.3    Platelets 05/30/2023 173.0    Neutrophils Relative % 05/30/2023 74.0    Lymphocytes Relative 05/30/2023 16.3    Monocytes Relative 05/30/2023 9.2    Eosinophils Relative 05/30/2023 0.3    Basophils Relative 05/30/2023 0.2    Neutro Abs 05/30/2023 4.7    Lymphs Abs 05/30/2023 1.0    Monocytes Absolute 05/30/2023 0.6    Eosinophils Absolute 05/30/2023 0.0    Basophils  Absolute 05/30/2023 0.0   Procedure visit on 02/26/2023  Component Date Value   SURGICAL PATHOLOGY 02/26/2023                     Value:SURGICAL PATHOLOGY [facility] [address] [place], [place] [address] Telephone [phone] or [phone] Fax [phone]  REPORT OF SURGICAL PATHOLOGY   Accession #: [id number] Patient Name: ALBIE, CORDRAY Visit # : [id number]  MRN: [id number] Physician: Alvester Johnson DOB/Age [birth year] (Age: 51) Gender: M Collected Date: 02/26/2023 Received Date: 02/27/2023  FINAL DIAGNOSIS       1. Surgical [P], colon, ascending, polyp (1) :       BENIGN COLONIC MUCOSA.      MULTIPLE ADDITIONAL LEVELS EXAMINED.       DATE SIGNED OUT: 03/03/2023 ELECTRONIC SIGNATURE : Earleen Glazier, John, Pathologist, Electronic Signature  MICROSCOPIC DESCRIPTION  CASE COMMENTS STAINS USED IN DIAGNOSIS: H&E *RECUT DEEPER X 9 LEVELS *RECUT DEEPER X 9 LEVELS *RECUT DEEPER X 9 LEVELS    CLINICAL HISTORY  SPECIMEN(S) OBTAINED 1. Surgical [P], Colon, Ascending, Polyp (1)  SPECIMEN COMMENTS: 1. Family history of colon cancer; benign neop                         lasm of ascending colon SPECIMEN CLINICAL INFORMATION: 1. R/O adenoma    Gross Description 1. Received in formalin are tan, soft tissue fragments that are submitted in toto. Number: 1 Size: 0.2 cm, (1B) ( TA )        Report signed out from the following location(s) [facility] [address] [place], [place] [address] CLIA #: [id number]  [facility] [address] [place], [place] [address] CLIA #: [id number]   Hospital Outpatient Visit on 01/20/2023  Component Date Value   Magnesium 01/20/2023 2.1   Lab on 01/02/2023  Component Date Value   WBC 01/02/2023 4.8    RBC 01/02/2023 5.12    Hemoglobin 01/02/2023 15.3    HCT 01/02/2023 45.1  MCV 01/02/2023 88.1    MCHC 01/02/2023 34.0    RDW 01/02/2023 13.4     Platelets 01/02/2023 212.0    Neutrophils Relative % 01/02/2023 58.6    Lymphocytes Relative 01/02/2023 28.5    Monocytes Relative 01/02/2023 9.0    Eosinophils Relative 01/02/2023 3.1    Basophils Relative 01/02/2023 0.8    Neutro Abs 01/02/2023 2.8    Lymphs Abs 01/02/2023 1.4    Monocytes Absolute 01/02/2023 0.4    Eosinophils Absolute 01/02/2023 0.1    Basophils Absolute 01/02/2023 0.0    Sodium 01/02/2023 139    Potassium 01/02/2023 4.1    Chloride 01/02/2023 101    CO2 01/02/2023 30    Glucose, Bld 01/02/2023 87    BUN 01/02/2023 10    Creatinine, Ser 01/02/2023 0.87    Total Bilirubin 01/02/2023 0.7    Alkaline Phosphatase 01/02/2023 60    AST 01/02/2023 16    ALT 01/02/2023 17    Total Protein 01/02/2023 6.7    Albumin  01/02/2023 4.3    GFR 01/02/2023 82.02    Calcium  01/02/2023 9.0    Cholesterol 01/02/2023 117    Triglycerides 01/02/2023 73.0    HDL 01/02/2023 43.00    VLDL 01/02/2023 14.6    LDL Cholesterol 01/02/2023 59    Total CHOL/HDL Ratio 01/02/2023 3    NonHDL 01/02/2023 73.74    TSH 01/02/2023 5.26   Office Visit on 09/18/2022  Component Date Value   Glucose 09/18/2022 77    BUN 09/18/2022 13    Creatinine, Ser 09/18/2022 0.82    eGFR 09/18/2022 89    BUN/Creatinine Ratio 09/18/2022 16    Sodium 09/18/2022 139    Potassium 09/18/2022 4.6    Chloride 09/18/2022 98    CO2 09/18/2022 27    Calcium  09/18/2022 9.6    Magnesium 09/18/2022 2.3   No image results found. No results found.      01/08/2023    2:03 PM 01/30/2022    3:03 PM 12/06/2021    9:29 AM 10/04/2020    9:45 AM  PHQ 2/9 Scores  PHQ - 2 Score 0 0 0 0   Results DIAGNOSTIC Colonoscopy: Minor diverticulosis, small polyps, hemorrhoids (04/2023)    Assessment & Plan Food poisoning Suspicious for Bacterial gastroenteritis.  He experienced acute onset of severe diarrhea, nausea, and bloating after a meal, likely due to bacterial food poisoning from a contaminated salad. Symptoms  began Wednesday night, with no fever but significant fatigue. Differential diagnosis includes bacterial gastroenteritis, possibly pathogenic E. coli, Campylobacter, or Shigella. C. diff is considered due to nursing home, but suspicion is low.  After extensive consideration of potential pathogens and his medication(s) interactions (dofetilide ),  Empiric antibiotic treatment with cefixime is initiated due to high-risk status and upcoming travel plans. Potential side effects of antibiotics, including GI upset, were discussed, emphasizing the importance of completing the full course. If effective, symptoms may improve within 1-2 days, increasing the chance of traveling to [place] to 90%. Stool cultures for C. diff and other pathogens are ordered. He is advised to hydrate with 8-10 glasses of clear liquids daily and use electrolyte solutions, follow a BRAT diet to manage diarrhea, and monitor for signs of dehydration or worsening symptoms, seeking ER care if necessary. Blood tests are ordered to check potassium levels and kidney function due to dofetilide  use. Diverticula of colon Minor diverticulosis was noted on a recent colonoscopy, with no diverticulitis or significant complications. The colonoscopy showed a few small polyps and minor  hemorrhoids, with no abdominal tenderness in the areas of diverticula.  In the context of upcoming travel to [place], he is advised to cancel the trip if not feeling well by Sunday. If symptoms improve by Sunday, there is a low chance of relapse, but C. diff will not resolve without specific treatment. He should follow up with his PCP or return to the clinic next week if symptoms persist, return the stool sample to the lab by the end of the day for culture testing, and check with the pharmacy for the availability of the prescribed antibiotic.       Orders Placed During this Encounter:   Orders Placed This Encounter  Procedures   Stool Culture   Clostridium Difficile by  PCR([facility])    Standing Status:   Future    Number of Occurrences:   1    Expiration Date:   05/29/2024   Comp Met (CMET)   CBC w/Diff    cefixime (SUPRAX) 400 MG CAPS capsule    Sig: Take 1 capsule (400 mg total) by mouth daily.    Dispense:  5 capsule    Refill:  0    Medical Decision Making: 1 or more chronic illnesses with exacerbation,  progression, or side effects of treatment 1 undiagnosed new problem with uncertain prognosis 1 acute illness with systemic symptoms     Ordering of each unique test; Prescription drug management       This document was synthesized by artificial intelligence ([employer]bridge) using HIPAA-compliant recording of the clinical interaction;   We discussed the use of AI scribe software for clinical note transcription with the patient, who gave verbal consent to proceed. additional Info: This encounter employed state-of-the-art, real-time, collaborative documentation. The patient actively reviewed and assisted in updating their electronic medical record on a shared screen, ensuring transparency and facilitating joint problem-solving for the problem list, overview, and plan. This approach promotes accurate, informed care. The treatment plan was discussed and reviewed in detail, including medication safety, potential side effects, and all patient questions. We confirmed understanding and comfort with the plan. Follow-up instructions were established, including contacting the office for any concerns, returning if symptoms worsen, persist, or new symptoms develop, and precautions for potential emergency department visits.

## 2023-05-30 NOTE — Telephone Encounter (Signed)
 Noted.

## 2023-06-01 ENCOUNTER — Encounter: Payer: Self-pay | Admitting: Internal Medicine

## 2023-06-02 LAB — CLOSTRIDIUM DIFFICILE BY PCR: Toxigenic C. Difficile by PCR: NEGATIVE

## 2023-06-02 NOTE — Telephone Encounter (Signed)
 Patient made aware of results and recommendations per Dr. Boston Byers.. patient opted to schedule a follow up with Dr.Andy on Wednesday 06/04/23.

## 2023-06-02 NOTE — Telephone Encounter (Signed)
 Left message for patient to give office a call back to discuss recent lab results and recommendations per Dr Boston Byers.

## 2023-06-04 ENCOUNTER — Encounter: Payer: Self-pay | Admitting: Family Medicine

## 2023-06-04 ENCOUNTER — Ambulatory Visit: Admitting: Family Medicine

## 2023-06-04 ENCOUNTER — Ambulatory Visit: Payer: Self-pay | Admitting: Internal Medicine

## 2023-06-04 VITALS — BP 140/74 | HR 73 | Temp 97.7°F | Ht 66.0 in | Wt 150.6 lb

## 2023-06-04 DIAGNOSIS — E871 Hypo-osmolality and hyponatremia: Secondary | ICD-10-CM

## 2023-06-04 DIAGNOSIS — E860 Dehydration: Secondary | ICD-10-CM

## 2023-06-04 DIAGNOSIS — K529 Noninfective gastroenteritis and colitis, unspecified: Secondary | ICD-10-CM

## 2023-06-04 LAB — STOOL CULTURE: E coli, Shiga toxin Assay: NEGATIVE

## 2023-06-04 NOTE — Progress Notes (Signed)
 Subjective  CC:  Chief Complaint  Patient presents with   Follow-up   Diarrhea    Diarrhea for the past week since last Wednesday. Much better today    HPI: Ronnie Black is a 78 y.o. male who presents to the office today to address the problems listed above in the chief complaint. Discussed the use of AI scribe software for clinical note transcription with the patient, who gave verbal consent to proceed.  History of Present Illness Ronnie Black is an 78 year old male who presents with symptoms of food poisoning.  Symptoms began on Wednesday evening with significant diarrhea lasting from Wednesday to Sunday, transitioning to softer stools on Monday and Tuesday. Today, he has not experienced any diarrhea. His appetite is diminished, and he has been maintaining a bland diet. He feels fatigued but has no lightheadedness, palpitations, or chest pain. He experienced bloating earlier in the illness but not in the last day or two. I reviewed OV notes and lab results.   No visits with results within 1 Day(s) from this visit.  Latest known visit with results is:  Office Visit on 05/30/2023  Component Date Value Ref Range Status   Salmonella/Shigella Screen 05/30/2023 Final report   Final   Stool Culture result 1 (RSASHR) 05/30/2023 Comment   Final   Campylobacter Culture 05/30/2023 Final report   Final   Stool Culture result 1 (CMPCXR) 05/30/2023 Comment   Final   E coli, Shiga toxin Assay 05/30/2023 Negative  Negative Final   Sodium 05/30/2023 128 (L)  135 - 145 mEq/L Final   Potassium 05/30/2023 3.7  3.5 - 5.1 mEq/L Final   Chloride 05/30/2023 95 (L)  96 - 112 mEq/L Final   CO2 05/30/2023 25  19 - 32 mEq/L Final   Glucose, Bld 05/30/2023 108 (H)  70 - 99 mg/dL Final   BUN 16/10/9602 16  6 - 23 mg/dL Final   Creatinine, Ser 05/30/2023 0.93  0.40 - 1.50 mg/dL Final   Total Bilirubin 05/30/2023 0.8  0.2 - 1.2 mg/dL Final   Alkaline Phosphatase 05/30/2023 39  39 - 117 U/L Final    AST 05/30/2023 23  0 - 37 U/L Final   ALT 05/30/2023 23  0 - 53 U/L Final   Total Protein 05/30/2023 6.5  6.0 - 8.3 g/dL Final   Albumin  05/30/2023 4.3  3.5 - 5.2 g/dL Final   GFR 54/09/8117 77.83  >60.00 mL/min Final   Calcium  05/30/2023 8.6  8.4 - 10.5 mg/dL Final   WBC 14/78/2956 6.3  4.0 - 10.5 K/uL Final   RBC 05/30/2023 4.93  4.22 - 5.81 Mil/uL Final   Hemoglobin 05/30/2023 14.4  13.0 - 17.0 g/dL Final   HCT 21/30/8657 43.3  39.0 - 52.0 % Final   MCV 05/30/2023 87.9  78.0 - 100.0 fl Final   MCHC 05/30/2023 33.3  30.0 - 36.0 g/dL Final   RDW 84/69/6295 13.3  11.5 - 15.5 % Final   Platelets 05/30/2023 173.0  150.0 - 400.0 K/uL Final   Neutrophils Relative % 05/30/2023 74.0  43.0 - 77.0 % Final   Lymphocytes Relative 05/30/2023 16.3  12.0 - 46.0 % Final   Monocytes Relative 05/30/2023 9.2  3.0 - 12.0 % Final   Eosinophils Relative 05/30/2023 0.3  0.0 - 5.0 % Final   Basophils Relative 05/30/2023 0.2  0.0 - 3.0 % Final   Neutro Abs 05/30/2023 4.7  1.4 - 7.7 K/uL Final   Lymphs Abs 05/30/2023 1.0  0.7 - 4.0 K/uL Final   Monocytes Absolute 05/30/2023 0.6  0.1 - 1.0 K/uL Final   Eosinophils Absolute 05/30/2023 0.0  0.0 - 0.7 K/uL Final   Basophils Absolute 05/30/2023 0.0  0.0 - 0.1 K/uL Final   Toxigenic C. Difficile by PCR 05/30/2023 Negative  Negative Final     He experienced nausea on Thursday and Friday without vomiting. No fever or body aches. Symptoms started after dinner on Wednesday, but another person who ate the same meal did not get sick. He is uncertain if the illness was due to food poisoning or a viral infection.  He completed a five-day course of Suprax for empiric treatment of bacterial colitis. from Friday to Tuesday. He is currently taking dofetilide , which requires careful monitoring of potassium levels. He purchased over-the-counter potassium supplements but is unsure about their efficacy. K+ goal >4.0  Assessment  1. Hyponatremia   2. Gastroenteritis   3.  Dehydration      Plan  Assessment and Plan Assessment & Plan Acute gastroenteritis Acute gastroenteritis with improved symptoms. Viral etiology suspected. Bacterial causes ruled out. Antibiotics already completed for potential bacterial pathogens. - Continue bland diet. - completed travel insurance form since he had to cancel his trip to [place] due to symptoms/illness  Dehydration Dehydration secondary to gastroenteritis. Rehydration needed to correct electrolyte imbalances. - Order re-evaluation of electrolytes, including sodium and potassium levels.  Hyponatremia Hyponatremia due to dehydration from gastroenteritis. Requires monitoring and correction. - Re-evaluate sodium levels.  Hypokalemia Hypokalemia concerning due to dofetilide  use. Prescription supplementation needed. - Re-evaluate potassium levels. - Prescribe potassium supplements based on lab results.    Follow up: prn Orders Placed This Encounter  Procedures   Basic metabolic panel with GFR   No orders of the defined types were placed in this encounter.    I reviewed the patients updated PMH, FH, and SocHx.  Patient Active Problem List   Diagnosis Date Noted   Hypercoagulable state due to persistent atrial fibrillation (HCC) 04/01/2022    Priority: High   Aortic atherosclerosis (HCC) 05/22/2021    Priority: High   Malignant neoplasm of prostate (HCC) 05/08/2021    Priority: High   Essential hypertension 12/01/2019    Priority: High   Current use of long term anticoagulation 11/06/2018    Priority: High   Atrial fibrillation status post cardioversion (HCC) 02/24/2017    Priority: High   Family history of colon cancer 10/01/2012    Priority: High   Mixed hyperlipidemia 08/13/2008    Priority: High   HOCM (hypertrophic obstructive cardiomyopathy) (HCC) 08/13/2008    Priority: High   Status post total replacement of right hip 04/12/2022    Priority: Medium    Bilateral primary osteoarthritis of hip  12/04/2020    Priority: Medium    Persistent atrial fibrillation (HCC) 11/21/2016    Priority: Medium    Benign prostatic hyperplasia without lower urinary tract symptoms 11/07/2016    Priority: Medium    Herniated lumbar disc without myelopathy 07/08/2012    Priority: Medium    ED (erectile dysfunction) 01/07/2012    Priority: Low   Actinic keratosis 01/07/2012    Priority: Low   Seborrheic keratosis 01/07/2012    Priority: Low   CAROTID BRUIT 08/24/2008    Priority: Low   Current Meds  Medication Sig   apixaban  (ELIQUIS ) 5 MG TABS tablet Take 1 tablet (5 mg total) by mouth 2 (two) times daily.   atorvastatin  (LIPITOR) 10 MG tablet Take 1 tablet (10  mg total) by mouth daily.   dofetilide  (TIKOSYN ) 500 MCG capsule Take 1 capsule (500 mcg total) by mouth 2 (two) times daily.   fluticasone  (CUTIVATE ) 0.005 % ointment Apply 1 Application topically as needed.   losartan  (COZAAR ) 25 MG tablet Take 1 tablet (25 mg total) by mouth daily.   melatonin 5 MG TABS Take 5 mg by mouth at bedtime.   metoprolol  tartrate (LOPRESSOR ) 25 MG tablet Take 1 tablet (25 mg total) by mouth 2 (two) times daily.   mirabegron  ER (MYRBETRIQ ) 25 MG TB24 tablet Take 1 tablet (25 mg total) by mouth daily.   Multiple Vitamin (MULTIVITAMIN WITH MINERALS) TABS tablet Take 1 tablet by mouth 4 (four) times a week.   Oxymetazoline  HCl (RHOFADE ) 1 % CREA Apply 1 a small amount to skin once a day   sodium chloride  (OCEAN) 0.65 % SOLN nasal spray Place 1 spray into both nostrils as needed (dryness).   tamsulosin  (FLOMAX ) 0.4 MG CAPS capsule Take 1 capsule (0.4 mg total) by mouth daily.   Allergies: Patient has no known allergies. Family History: Patient family history includes Colon cancer in his father and mother; Coronary artery disease in an other family member; Lung cancer in his mother. Social History:  Patient  reports that he quit smoking about 47 years ago. His smoking use included cigarettes. He started smoking  about 62 years ago. He has a 7.5 pack-year smoking history. He has never used smokeless tobacco. He reports current alcohol use of about 6.0 standard drinks of alcohol per week. He reports that he does not use drugs.  Review of Systems: Constitutional: Negative for fever malaise or anorexia Cardiovascular: negative for chest pain Respiratory: negative for SOB or persistent cough Gastrointestinal: negative for abdominal pain  Objective  Vitals: BP (!) 140/74   Pulse 73   Temp 97.7 F (36.5 C)   Ht 5\' 6"  (1.676 m)   Wt 150 lb 9.6 oz (68.3 kg)   SpO2 98%   BMI 24.31 kg/m  General: no acute distress , A&Ox3 HEENT: PEERL, conjunctiva normal, neck is supple Cardiovascular:  RRR without murmur or gallop.  Respiratory:  Good breath sounds bilaterally, CTAB with normal respiratory effort Abd: soft, nontender, normal bs. No masses Skin:  Warm, no rashes Commons side effects, risks, benefits, and alternatives for medications and treatment plan prescribed today were discussed, and the patient expressed understanding of the given instructions. Patient is instructed to call or message via MyChart if he/she has any questions or concerns regarding our treatment plan. No barriers to understanding were identified. We discussed Red Flag symptoms and signs in detail. Patient expressed understanding regarding what to do in case of urgent or emergency type symptoms.  Medication list was reconciled, printed and provided to the patient in AVS. Patient instructions and summary information was reviewed with the patient as documented in the AVS. This note was prepared with assistance of Dragon voice recognition software. Occasional wrong-word or sound-a-like substitutions may have occurred due to the inherent limitations of voice recognition software

## 2023-06-04 NOTE — Patient Instructions (Signed)
 Please follow up if symptoms do not improve or as needed.     VISIT SUMMARY: You came in today with symptoms of food poisoning, including diarrhea, nausea, and fatigue. Your symptoms have improved, and you are currently on a bland diet. We discussed your recent antibiotic use and the need to monitor your potassium levels due to your current medication, dofetilide .  YOUR PLAN: -ACUTE GASTROENTERITIS: Acute gastroenteritis is an inflammation of the stomach and intestines, often caused by a viral or bacterial infection. Your symptoms have improved, and you should continue with a bland diet. We reviewed your antibiotic use to ensure it is compatible with your current medications.  -DEHYDRATION: Dehydration occurs when your body loses more fluids than it takes in, often due to diarrhea or vomiting. You need to rehydrate to correct any electrolyte imbalances. We will re-evaluate your electrolyte levels, including sodium and potassium.  -HYPONATREMIA: Hyponatremia is a condition where your blood sodium levels are too low, often due to dehydration. We will monitor and correct your sodium levels as needed.  -HYPOKALEMIA: Hypokalemia is a condition where your blood potassium levels are too low, which is concerning given your use of dofetilide . We will re-evaluate your potassium levels and prescribe supplements based on the lab results.  INSTRUCTIONS: We will re-evaluate your electrolyte levels, including sodium and potassium. Please continue with a bland diet and stay hydrated. Follow up with us  if your symptoms worsen or if you experience any new symptoms.                      Contains text generated by Abridge.                                 Contains text generated by Abridge.

## 2023-06-05 LAB — BASIC METABOLIC PANEL WITH GFR
BUN: 12 mg/dL (ref 6–23)
CO2: 30 meq/L (ref 19–32)
Calcium: 9.1 mg/dL (ref 8.4–10.5)
Chloride: 97 meq/L (ref 96–112)
Creatinine, Ser: 0.89 mg/dL (ref 0.40–1.50)
GFR: 81.22 mL/min (ref >60.00)
Glucose, Bld: 84 mg/dL (ref 70–99)
Potassium: 4.4 meq/L (ref 3.5–5.1)
Sodium: 136 meq/L (ref 135–145)

## 2023-06-10 ENCOUNTER — Ambulatory Visit: Payer: Self-pay | Admitting: Family Medicine

## 2023-06-10 NOTE — Progress Notes (Signed)
 See mychart note Dear Mr. Becvar, Your electrolytes are back in the normal range. Glad you are feeling better.  Sincerely, Dr. Jonelle Neri

## 2023-07-01 ENCOUNTER — Encounter: Payer: Self-pay | Admitting: Gastroenterology

## 2023-07-01 ENCOUNTER — Ambulatory Visit: Admitting: Gastroenterology

## 2023-07-01 VITALS — BP 116/64 | HR 73 | Ht 66.0 in | Wt 149.0 lb

## 2023-07-01 DIAGNOSIS — K5730 Diverticulosis of large intestine without perforation or abscess without bleeding: Secondary | ICD-10-CM | POA: Diagnosis not present

## 2023-07-01 DIAGNOSIS — K627 Radiation proctitis: Secondary | ICD-10-CM | POA: Diagnosis not present

## 2023-07-01 DIAGNOSIS — Y842 Radiological procedure and radiotherapy as the cause of abnormal reaction of the patient, or of later complication, without mention of misadventure at the time of the procedure: Secondary | ICD-10-CM | POA: Diagnosis not present

## 2023-07-01 NOTE — Progress Notes (Signed)
 HPI :  78 year old male with A-fib, on Eliquis , history of prostate cancer status post XRT, strong family history of colon cancer (colon cancer in his mother and father diagnosed age 29s), here for follow-up visit to discuss findings on his recent colonoscopy.  Recall he had brachytherapy for prostate cancer in 2023.  At the time he said he had some fecal urgency associated with his urge for urination, although this has tapered down over time and is pretty mild right now.  He denies any rectal bleeding or any significant problems with his bowels.  He eats quite healthy, high-fiber diet, is quite active and exercises routinely.  Colonoscopy showed 1 benign polyp without any cancerous potential.  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  He also had some small hemorrhoids.  He has several questions about diverticulosis and radiation proctitis today which we discussed below.  He is never had diverticulitis, denies any rectal bleeding or significant urgency at this time   Colonoscopy 02/26/23: - Small skin tags were found on perianal exam. - A 2 mm polyp was found in the ascending colon. The polyp was sessile. The polyp was removed with a cold snare. Resection and retrieval were complete. - A few small-mouthed diverticula were found in the sigmoid colon. - An area of erythematous mucosa was found in the rectum - could be mild radiation changes (history of prostate cancer). No high risk lesions for bleeding. - Internal hemorrhoids were found during retroflexion. The hemorrhoids were small. - The exam was otherwise without abnormality.  1. Surgical [P], colon, ascending, polyp (1) :       BENIGN COLONIC MUCOSA.       MULTIPLE ADDITIONAL LEVELS EXAMINED.         Past Medical History:  Diagnosis Date   Complication of anesthesia    slow to awaken in past   Current use of long term anticoagulation 11/06/2018   DDD (degenerative disc disease)     HLD (hyperlipidemia)    Hypertension    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 obstruction   Persistent atrial fibrillation (HCC)    s/p PVI 3/12   Pneumonia    Prostate cancer (HCC) 05/08/2021   04/2021     Past Surgical History:  Procedure Laterality Date   APPENDECTOMY  1970   atrial fibrillation ablation  03/27/2010   PVI with CTI ablation by JA   ATRIAL FIBRILLATION ABLATION N/A 11/21/2016   Procedure: ATRIAL FIBRILLATION ABLATION;  Surgeon: Jolly Needle, MD;  Location: [facility] INVASIVE CV LAB;  Service: Cardiovascular;  Laterality: N/A;   CARDIOVERSION     CARDIOVERSION  04/26/2011   Procedure: CARDIOVERSION;  Surgeon: Darlis Eisenmenger, MD;  Location: [facility] ENDOSCOPY;  Service: Cardiovascular;  Laterality: N/A;   CARDIOVERSION N/A 04/28/2012   Procedure: CARDIOVERSION;  Surgeon: Lake Pilgrim, MD;  Location: [facility] ENDOSCOPY;  Service: Cardiovascular;  Laterality: N/A;   CARDIOVERSION N/A 07/28/2012   Procedure: CARDIOVERSION;  Surgeon: Lenise Quince, MD;  Location: [facility] ENDOSCOPY;  Service: Cardiovascular;  Laterality: N/A;   CARDIOVERSION N/A 08/14/2012   Procedure: CARDIOVERSION;  Surgeon: Loyde Rule, MD;  Location: [facility] ENDOSCOPY;  Service: Cardiovascular;  Laterality: N/A;   CARDIOVERSION N/A 08/11/2014   Procedure: CARDIOVERSION;  Surgeon: Elmyra Haggard, MD;  Location: [facility] ENDOSCOPY;  Service: Cardiovascular;  Laterality: N/A;   CARDIOVERSION N/A 08/26/2014   Procedure: CARDIOVERSION;  Surgeon: Luana Rumple, MD;  Location: [facility] ENDOSCOPY;  Service: Cardiovascular;  Laterality: N/A;   CARDIOVERSION N/A 10/10/2016   Procedure: CARDIOVERSION;  Surgeon: Darlis Eisenmenger, MD;  Location: [facility] ENDOSCOPY;  Service: Cardiovascular;  Laterality: N/A;   CARDIOVERSION N/A 12/04/2016   Procedure: CARDIOVERSION;  Surgeon: Darlis Eisenmenger, MD;  Location: [facility] ENDOSCOPY;  Service: Cardiovascular;  Laterality: N/A;   CARDIOVERSION N/A 06/09/2018   Procedure: CARDIOVERSION;  Surgeon:  Liza Riggers, MD;  Location: [facility] ENDOSCOPY;  Service: Cardiovascular;  Laterality: N/A;   CARDIOVERSION N/A 08/17/2019   Procedure: CARDIOVERSION;  Surgeon: Maudine Sos, MD;  Location: [facility] ENDOSCOPY;  Service: Cardiovascular;  Laterality: N/A;   CARDIOVERSION N/A 11/18/2019   Procedure: CARDIOVERSION;  Surgeon: Sonny Dust, MD;  Location: [facility] ENDOSCOPY;  Service: Cardiovascular;  Laterality: N/A;   CARDIOVERSION N/A 03/09/2020   Procedure: CARDIOVERSION;  Surgeon: Hugh Madura, MD;  Location: [facility] ENDOSCOPY;  Service: Cardiovascular;  Laterality: N/A;   HIP ARTHROSCOPY     RADIOACTIVE SEED IMPLANT N/A 08/06/2021   Procedure: RADIOACTIVE SEED IMPLANT/BRACHYTHERAPY IMPLANT;  Surgeon: Trent Frizzle, MD;  Location: [facility];  Service: Urology;  Laterality: N/A;   SKIN CANCER EXCISION     SPACE OAR INSTILLATION N/A 08/06/2021   Procedure: SPACE OAR INSTILLATION;  Surgeon: Trent Frizzle, MD;  Location: [facility];  Service: Urology;  Laterality: N/A;   TEE WITHOUT CARDIOVERSION  04/26/2011   Procedure: TRANSESOPHAGEAL ECHOCARDIOGRAM (TEE);  Surgeon: Darlis Eisenmenger, MD;  Location: [facility] ENDOSCOPY;  Service: Cardiovascular;  Laterality: N/A;  to be done at 1330   TEE WITHOUT CARDIOVERSION N/A 04/28/2012   Procedure: TRANSESOPHAGEAL ECHOCARDIOGRAM (TEE);  Surgeon: Lake Pilgrim, MD;  Location: [facility] ENDOSCOPY;  Service: Cardiovascular;  Laterality: N/A;   TOTAL HIP ARTHROPLASTY Right 04/12/2022   Procedure: RIGHT TOTAL HIP ARTHROPLASTY ANTERIOR APPROACH;  Surgeon: Arnie Lao, MD;  Location: [facility] ORS;  Service: Orthopedics;  Laterality: Right;   WISDOM TOOTH EXTRACTION     in 1960's per pt   Family History  Problem Relation Age of Onset   Colon cancer Mother    Lung cancer Mother    Colon cancer Father    Coronary artery disease Other    Liver disease Neg Hx    Esophageal cancer Neg Hx    Social History   Tobacco Use    Smoking status: Former    Current packs/day: 0.00    Average packs/day: 0.5 packs/day for 15.0 years (7.5 ttl pk-yrs)    Types: Cigarettes    Start date: 11/27/1960    Quit date: 11/28/1975    Years since quitting: 47.6   Smokeless tobacco: Never  Vaping Use   Vaping status: Never Used  Substance Use Topics   Alcohol use: Yes    Alcohol/week: 6.0 standard drinks of alcohol    Types: 3 Glasses of wine, 3 Cans of beer per week    Comment: social   Drug use: No   Current Outpatient Medications  Medication Sig Dispense Refill   apixaban  (ELIQUIS ) 5 MG TABS tablet Take 1 tablet (5 mg total) by mouth 2 (two) times daily. 180 tablet 3   atorvastatin  (LIPITOR) 10 MG tablet Take 1 tablet (10 mg total) by mouth daily. 90 tablet 3   dofetilide  (TIKOSYN ) 500 MCG capsule Take 1 capsule (500 mcg total) by mouth 2 (two) times daily. 180 capsule 3   fluticasone  (CUTIVATE ) 0.005 % ointment Apply 1 Application topically as needed.     losartan  (COZAAR ) 25 MG tablet Take 1 tablet (25 mg total) by  mouth daily. 90 tablet 2   melatonin 5 MG TABS Take 5 mg by mouth at bedtime.     metoprolol  tartrate (LOPRESSOR ) 25 MG tablet Take 1 tablet (25 mg total) by mouth 2 (two) times daily. 180 tablet 1   Multiple Vitamin (MULTIVITAMIN WITH MINERALS) TABS tablet Take 1 tablet by mouth 4 (four) times a week.     sodium chloride  (OCEAN) 0.65 % SOLN nasal spray Place 1 spray into both nostrils as needed (dryness).     tamsulosin  (FLOMAX ) 0.4 MG CAPS capsule Take 1 capsule (0.4 mg total) by mouth daily. 90 capsule 1   No current facility-administered medications for this visit.   No Known Allergies   Review of Systems: All systems reviewed and negative except where noted in HPI.   Lab Results  Component Value Date   WBC 6.3 05/30/2023   HGB 14.4 05/30/2023   HCT 43.3 05/30/2023   MCV 87.9 05/30/2023   PLT 173.0 05/30/2023     Physical Exam: BP 116/64   Pulse 73   Ht 5\' 6"  (1.676 m)   Wt 149 lb (67.6  kg)   BMI 24.05 kg/m  Constitutional: Pleasant,well-developed, male in no acute distress. Neurological: Alert and oriented to person place and time. Psychiatric: Normal mood and affect. Behavior is normal.   ASSESSMENT: 78 y.o. male here for assessment of the following  1. Radiation proctitis   2. Diverticulosis of colon without hemorrhage    Based on colonoscopy I think he has developed some very mild radiation proctitis.  He does not have much symptoms concerning for this right now.  We discussed what symptoms of this would entail and should he develop that he should let me know.  I do not feel that he needs therapy for this right now and hopefully he never does.  Changes endoscopically were pretty mild.  He understands he has this and should he develop bleeding or significant urgency etc., he will let me know.  Discussed diverticulosis which he had questions about.  Related this is very common finding, at risk for diverticulitis, he did not have a significant burden of this and hopefully that will never happen to him.  Benign polyp, no high risk lesions.  Given his age I do not think he needs another colonoscopy.  He will follow-up with us  as needed moving forward   PLAN: - counseled on diverticulosis and risks of diverticulitis - doing well, hopefully low risk for diverticulitis. Continue healthy / high fiber diet, maintain weight, exercise - counseled on radiation proctitis - no significant symptoms now, he will contact me if he does - no further screening colonoscopies   Christi Coward, MD [facility]

## 2023-07-08 ENCOUNTER — Ambulatory Visit (HOSPITAL_COMMUNITY)
Admission: RE | Admit: 2023-07-08 | Discharge: 2023-07-08 | Disposition: A | Discharge status: Home / Self Care | Payer: Medicare PPO | Source: Ambulatory Visit | Attending: Physician Assistant | Admitting: Physician Assistant

## 2023-07-08 ENCOUNTER — Encounter (HOSPITAL_COMMUNITY): Payer: Self-pay | Admitting: Physician Assistant

## 2023-07-08 VITALS — BP 134/80 | HR 76 | Ht 66.0 in | Wt 150.2 lb

## 2023-07-08 DIAGNOSIS — Z5181 Encounter for therapeutic drug level monitoring: Secondary | ICD-10-CM

## 2023-07-08 DIAGNOSIS — Z79899 Other long term (current) drug therapy: Secondary | ICD-10-CM

## 2023-07-08 DIAGNOSIS — D6869 Other thrombophilia: Secondary | ICD-10-CM | POA: Diagnosis not present

## 2023-07-08 DIAGNOSIS — I4819 Other persistent atrial fibrillation: Secondary | ICD-10-CM | POA: Diagnosis not present

## 2023-07-08 NOTE — Progress Notes (Signed)
 Primary Care Physician: Luevenia Saha, MD Referring Physician: Dr. Ardell Koller EP: Dr. Carolene Chute is a 78 y.o. male with a h/o afib, frequent cardioversions in  the past. He has had afib/flutter ablation in 2012  and afib ablation fall of 2018. He was on norpace  at one time, changed to amiodarone  which was stopped shortly after last ablation, then back to Norpace . Unfortunately, he continued to have symptomatic breakthrough episodes of afib and the decision was made to stop Norpace  and load on dofetilide .   On follow up today, patient presents for dofetilide  admission. He states he stopped Norpace  5 days ago and has not missed any doses of anticoagulation in the last 3 weeks. He is in SR today.    F/u in afib clinic, 06/01/20. He is one week s/p tikosyn  load. He did well maintaining SR in the hospital and  was d/c on 500 mcg dofetilide   bid with stable qtc. He feels well and has been staying  in rhythm.   F/u in afib clinic, 02/13/21. He is staying in rhythm. Unfortunately, he lost his wife suddenly in July 2022. He is exercising regulary. He is compliant with his drugs.   F/u in afib clinic, 05/13/21. He is here for Tikosyn  surveillance. He has established with Dr. Marven Slimmer as of March of this year with  Dr. Nunzio Belch leaving the practice.  He is doing well staying in SR. Compliant with Tikosyn .   F/u in afib clinic, 8/29 for Tikosyn  surveillance. He has not noted any afib. He does note elevated BP's at home 140-150 range which have slowly creeped up and is concerned he may need BP med. His ekg showed some bigeminy PVC's but on ausculation he sounded regular. Qt stable. Repeat ECG reassuring no longer showing PVCs.  Follow up in the AF clinic 04/01/22. He is doing well overall. He has not noted any episodes of Afib. He is scheduled for right hip replacement on 3/22 and has been instructed to stop Eliquis  on 3/19. He has noted sporadic episodes of rapid regular heart rate (HR 100s)  about once every 2 months. These episodes will occur randomly and typically resolve after a few hours. Intermittent nosebleeds from October - December but this resolved on its own (he was followed by ENT). He has been compliant with Tikosyn  and Eliquis .   On follow up 04/26/22, patient underwent hip replacement on 04/11/21 and has done well with recovery. He was found to be tachycardic at rehab with rates in the 120s bpm. After 3 hours EMS was called and strips demonstrated atrial flutter. He was taken to the ED but spontaneously converted to SR en route. He has done well since that time with no further episodes.   Follow up in the AF clinic 01/20/23. Patient reports that he had one brief episode of afib in November that resolved quickly. He denies any bleeding issues on anticoagulation. He remains very active.   Follow up 07/08/23. Patient returns for follow up for atrial fibrillation and dofetilide  monitoring. He reports occasional episodes of afib that resolve spontaneously after a few hours. There does not appear to be any specific trigger. No bleeding issues on anticoagulation.   Today, he  denies symptoms of chest pain, shortness of breath, orthopnea, PND, lower extremity edema, dizziness, presyncope, syncope, snoring, daytime somnolence, bleeding, or neurologic sequela. The patient is tolerating medications without difficulties and is otherwise without complaint today.    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 180 tablet 3   atorvastatin  (LIPITOR) 10 MG tablet Take 1 tablet (10 mg total) by mouth daily. 90 tablet 3   dofetilide  (TIKOSYN ) 500 MCG capsule Take 1 capsule (500 mcg total) by mouth 2 (two) times daily. 180 capsule 3   fluticasone  (CUTIVATE ) 0.005 % ointment Apply 1 Application topically as needed.     losartan  (COZAAR ) 25 MG tablet Take 1 tablet (25 mg total) by mouth daily. 90 tablet 2   melatonin 5 MG TABS Take 5 mg by mouth at bedtime.     metoprolol  tartrate (LOPRESSOR ) 25 MG tablet Take 1 tablet (25 mg total) by mouth 2 (two) times daily. 180 tablet 1   Multiple Vitamin (MULTIVITAMIN WITH MINERALS) TABS tablet Take 1 tablet by mouth 4 (four) times a week.     sodium chloride  (OCEAN) 0.65 % SOLN nasal spray Place 1 spray into both nostrils as needed (dryness).     tamsulosin  (FLOMAX ) 0.4 MG CAPS capsule Take 1 capsule (0.4 mg total) by mouth daily. 90 capsule 1   No current facility-administered medications for this encounter.    ROS- All systems are reviewed and negative except as per the HPI above  Physical Exam: Vitals:   07/08/23 1406  BP: 134/80  Pulse: 76  Weight: 68.1 kg  Height: 5' 6 (1.676 m)    GEN: Well nourished, well developed in no acute distress CARDIAC: Regular rate and rhythm with occasional ectopy, no murmurs, rubs, gallops RESPIRATORY:  Clear to auscultation without rales, wheezing or rhonchi  ABDOMEN: Soft, non-tender, non-distended EXTREMITIES:  No edema; No deformity    ECG today demonstrates SR, PACs, T wave inv inferior and lateral  Vent. rate 76 BPM PR interval 192 ms QRS duration 96 ms QT/QTcB 386/434 ms   Echo 11/23/19 1. The apical views are concerning for apical LV hypertrophy. . Left  ventricular ejection fraction, by estimation, is 60 to 65%. The left  ventricle has normal function. The left ventricle has no regional wall  motion abnormalities. 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  Left ventricular diastolic parameters are indeterminate.   2. Right ventricular systolic function is normal. The right ventricular  size is normal.   3. The mitral valve is normal in structure. Mild mitral valve  regurgitation. No evidence of mitral stenosis.   4. The aortic valve is normal in structure. Aortic valve regurgitation is  not visualized. No aortic stenosis is present.    CHA2DS2-VASc Score = 5  The patient's score is based upon: CHF History: 1 HTN History: 1 Diabetes History: 0 Stroke History: 0 Vascular Disease History: 1 Age Score: 2 Gender Score: 0       ASSESSMENT AND PLAN: Persistent Atrial Fibrillation/atrial flutter The patient's CHA2DS2-VASc score is 5, indicating a 7.2% annual risk of stroke.   S/p ablation 03/2010 and 11/21/16 S/p dofetilide  loading 05/2020 Patient appears to be maintaining SR  with few episodes, he is happy with his present treatment.  Continue dofetilide  500 mcg BID Continue Eliquis  5 mg BID Continue Lopressor  25 mg BID  Secondary Hypercoagulable State (ICD10:  D68.69) The patient is at significant risk for stroke/thromboembolism based upon his CHA2DS2-VASc Score of 5.  Continue Apixaban  (Eliquis ). No bleeding issues.  High Risk Medication Monitoring (ICD 10: Z79.899) QT interval on ECG acceptable for dofetilide  monitoring. Recent bmet reviewed, check magnesium today.   HCM Non obstructive on echo  HTN Stable on current regimen   Follow up in the AF clinic in 6 months.    Myrtha Ates, PA-C [facility] [address] [place], [place] [address] [phone]

## 2023-07-09 ENCOUNTER — Ambulatory Visit (HOSPITAL_COMMUNITY): Payer: Self-pay | Admitting: Physician Assistant

## 2023-07-09 LAB — MAGNESIUM: Magnesium: 2.1 mg/dL (ref 1.6–2.3)

## 2023-07-10 ENCOUNTER — Ambulatory Visit: Payer: Self-pay | Admitting: Family Medicine

## 2023-07-10 NOTE — Telephone Encounter (Signed)
 Noted

## 2023-07-10 NOTE — Telephone Encounter (Signed)
 FYI Only or Action Required?: FYI only for provider.  Patient was last seen in primary care on 06/04/2023 by Luevenia Saha, MD. Called Nurse Triage reporting Urinary Frequency. Symptoms began yesterday. Interventions attempted: Nothing. Symptoms are: unchanged.  Triage Disposition: See Physician Within 24 Hours  Patient/caregiver understands and will follow disposition?: Yes                Copied from CRM [id number]. Topic: Clinical - Red Word Triage >> Jul 10, 2023 12:28 PM [name] wrote: Red Word that prompted transfer to Nurse Triage: Patient believes he has a UTI  due to peeing frequently and burning or irritation Reason for Disposition  All other males with painful urination  Urinating more frequently than usual (i.e., frequency)  Answer Assessment - Initial Assessment Questions Urinary frequency started yesterday Irritated feeling Denies pain, blood in urine, fever, lower back pain  Protocols used: Urination Pain - Male-A-AH, Urinary Symptoms-A-AH

## 2023-07-11 ENCOUNTER — Ambulatory Visit (HOSPITAL_COMMUNITY)
Admission: RE | Admit: 2023-07-11 | Discharge: 2023-07-11 | Disposition: A | Discharge status: Home / Self Care | Source: Ambulatory Visit | Attending: Physician Assistant | Admitting: Physician Assistant

## 2023-07-11 ENCOUNTER — Encounter: Payer: Self-pay | Admitting: Family Medicine

## 2023-07-11 ENCOUNTER — Ambulatory Visit: Admitting: Family Medicine

## 2023-07-11 ENCOUNTER — Other Ambulatory Visit (HOSPITAL_BASED_OUTPATIENT_CLINIC_OR_DEPARTMENT_OTHER): Payer: Self-pay

## 2023-07-11 VITALS — BP 116/67 | HR 74 | Temp 97.5°F | Ht 66.0 in | Wt 148.8 lb

## 2023-07-11 DIAGNOSIS — I4891 Unspecified atrial fibrillation: Secondary | ICD-10-CM

## 2023-07-11 DIAGNOSIS — I4819 Other persistent atrial fibrillation: Secondary | ICD-10-CM | POA: Diagnosis not present

## 2023-07-11 DIAGNOSIS — R350 Frequency of micturition: Secondary | ICD-10-CM

## 2023-07-11 DIAGNOSIS — D6869 Other thrombophilia: Secondary | ICD-10-CM

## 2023-07-11 DIAGNOSIS — Z5181 Encounter for therapeutic drug level monitoring: Secondary | ICD-10-CM

## 2023-07-11 DIAGNOSIS — I10 Essential (primary) hypertension: Secondary | ICD-10-CM

## 2023-07-11 LAB — POCT URINALYSIS DIPSTICK
Bilirubin, UA: NEGATIVE
Blood, UA: NEGATIVE
Glucose, UA: NEGATIVE
Ketones, UA: NEGATIVE
Leukocytes, UA: NEGATIVE
Nitrite, UA: NEGATIVE
Protein, UA: NEGATIVE
Spec Grav, UA: 1.015 (ref 1.010–1.025)
Urobilinogen, UA: 0.2 U/dL
pH, UA: 6 (ref 5.0–8.0)

## 2023-07-11 MED ORDER — NITROFURANTOIN MONOHYD MACRO 100 MG PO CAPS
100.0000 mg | ORAL_CAPSULE | Freq: Two times a day (BID) | ORAL | 0 refills | Status: DC
  Filled 2023-07-11: qty 14, 7d supply, fill #0

## 2023-07-11 NOTE — Progress Notes (Signed)
 Patient returns for ECG which shows:  Atrial flutter with variable block Vent. rate 83 BPM PR interval * ms QRS duration 90 ms QT/QTcB 376/441 ms  He reports that he went into afib on 6/18. He feels more anxious when in afib but denies any other symptoms. He did see his PCP earlier today for UTI symptoms, UA was negative but culture is pending. After discussing the risks and benefits will arrange for DCCV.    Informed Consent   Shared Decision Making/Informed Consent The risks (stroke, cardiac arrhythmias rarely resulting in the need for a temporary or permanent pacemaker, skin irritation or burns and complications associated with conscious sedation including aspiration, arrhythmia, respiratory failure and death), benefits (restoration of normal sinus rhythm) and alternatives of a direct current cardioversion were explained in detail to Ronnie Black and he agrees to proceed.

## 2023-07-11 NOTE — Addendum Note (Signed)
 Encounter addended by: Missouri Amor, RN on: 07/11/2023 1:54 PM  Actions taken: New alternative orders accepted, Order list changed, Diagnosis association updated

## 2023-07-11 NOTE — Patient Instructions (Addendum)
 Cardioversion scheduled for: July 8 Tuesday   - Arrive at the [facility] ([address]  and check in with ADMITTING at 6:30   - Do not eat or drink anything after midnight the night prior to your procedure.   - Take all your morning medication (except diabetic medications) with a sip of water  prior to arrival.  - Do NOT miss any doses of your blood thinner - if you should miss a dose or take a dose more than 4 hours late -- please notify our office immediately.  - You will not be able to drive home after your procedure. Please ensure you have a responsible adult to drive you home. You will need someone with you for 24 hours post procedure.     - Expect to be in the procedural area approximately 2 hours.   - If you feel as if you go back into normal rhythm prior to scheduled cardioversion, please notify our office immediately.   If your procedure is canceled in the cardioversion suite you will be charged a cancellation fee.

## 2023-07-11 NOTE — Patient Instructions (Signed)
 It was very nice to see you today!  You may have a urinary tract infection.  Your urine dipstick today did not show any obvious signs of infection.  I will send a prescription in for Macrobid while we wait for your culture results.  Return if symptoms worsen or fail to improve.   Take care, Dr Daneil Dunker  PLEASE NOTE:  If you had any lab tests, please let us  know if you have not heard back within a few days. You may see your results on mychart before we have a chance to review them but we will give you a call once they are reviewed by us .   If we ordered any referrals today, please let us  know if you have not heard from their office within the next week.   If you had any urgent prescriptions sent in today, please check with the pharmacy within an hour of our visit to make sure the prescription was transmitted appropriately.   Please try these tips to maintain a healthy lifestyle:  Eat at least 3 REAL meals and 1-2 snacks per day.  Aim for no more than 5 hours between eating.  If you eat breakfast, please do so within one hour of getting up.   Each meal should contain half fruits/vegetables, one quarter protein, and one quarter carbs (no bigger than a computer mouse)  Cut down on sweet beverages. This includes juice, soda, and sweet tea.   Drink at least 1 glass of water  with each meal and aim for at least 8 glasses per day  Exercise at least 150 minutes every week.

## 2023-07-11 NOTE — Progress Notes (Signed)
   Ronnie Black is a 78 y.o. male who presents today for an office visit.  Assessment/Plan:  New/Acute Problems: Urinary frequency/dysuria Point-of-care urinalysis negative however history and symptoms are consistent with previous UTIs.  Has history of complex UTIs and overall complicated medical history with prostate cancer and A-fib.  Will send prescription in for Macrobid while we await culture results however he will not start this unless he develops any worsening symptoms over the weekend.  Encouraged hydration.  We discussed reasons to return to care.    A-fib Managed by cardiology though seems like he converted into A-fib yesterday.  May be related to his above UTI.  He will follow-up with cardiology later today currently anticoagulated on Eliquis  and rate controlled with Tikosyn  and metoprolol .  Essential hypertension At goal today on metoprolol  tartrate 25 mg twice daily and losartan  25 mg daily.    Subjective:  HPI:  See A/P for status of chronic conditions.  Patient is here today with urinary urgency.  Also having some frequency and irritation as well.  He is concerned about possible urinary tract infection as symptoms are similar to previous UTIs. He has not had any fevers or chills. No hematuria.  He also noticed more fluttering in his heart since yesterday and believes he is converted into A-fib.  He has appoint with his cardiologist later today.       Objective:  Physical Exam: BP 116/67   Pulse 74   Temp (!) 97.5 F (36.4 C) (Oral)   Ht 5' 6 (1.676 m)   Wt 148 lb 12.8 oz (67.5 kg)   SpO2 97%   BMI 24.02 kg/m   Gen: No acute distress, resting comfortably CV: Irregular no murmurs appreciated Pulm: Normal work of breathing, clear to auscultation bilaterally with no crackles, wheezes, or rhonchi Neuro: Grossly normal, moves all extremities Psych: Normal affect and thought content      Thomas Mabry M. Daneil Dunker, MD 07/11/2023 8:41 AM

## 2023-07-12 LAB — URINE CULTURE
MICRO NUMBER:: 16606274
Result:: NO GROWTH
SPECIMEN QUALITY:: ADEQUATE

## 2023-07-12 LAB — BASIC METABOLIC PANEL WITH GFR
BUN/Creatinine Ratio: 17 (ref 10–24)
BUN: 14 mg/dL (ref 8–27)
CO2: 28 mmol/L (ref 20–29)
Calcium: 9.7 mg/dL (ref 8.6–10.2)
Chloride: 97 mmol/L (ref 96–106)
Creatinine, Ser: 0.82 mg/dL (ref 0.76–1.27)
Glucose: 109 mg/dL — ABNORMAL HIGH (ref 70–99)
Potassium: 5.4 mmol/L — ABNORMAL HIGH (ref 3.5–5.2)
Sodium: 136 mmol/L (ref 134–144)
eGFR: 89 mL/min/{1.73_m2} (ref >59)

## 2023-07-12 LAB — CBC
Hematocrit: 48.1 % (ref 37.5–51.0)
Hemoglobin: 15.8 g/dL (ref 13.0–17.7)
MCH: 29 pg (ref 26.6–33.0)
MCHC: 32.8 g/dL (ref 31.5–35.7)
MCV: 88 fL (ref 79–97)
Platelets: 210 10*3/uL (ref 150–450)
RBC: 5.45 x10E6/uL (ref 4.14–5.80)
RDW: 14.1 % (ref 11.6–15.4)
WBC: 6.6 10*3/uL (ref 3.4–10.8)

## 2023-07-14 ENCOUNTER — Ambulatory Visit (HOSPITAL_COMMUNITY): Payer: Self-pay | Admitting: Physician Assistant

## 2023-07-14 ENCOUNTER — Ambulatory Visit: Payer: Self-pay | Admitting: Family Medicine

## 2023-07-14 NOTE — Progress Notes (Signed)
 His urine culture is negative.  He should let us  know if his symptoms are not improving.

## 2023-07-15 ENCOUNTER — Ambulatory Visit (HOSPITAL_COMMUNITY): Payer: Medicare PPO | Admitting: Physician Assistant

## 2023-07-28 ENCOUNTER — Telehealth (HOSPITAL_COMMUNITY): Payer: Self-pay | Admitting: *Deleted

## 2023-07-28 ENCOUNTER — Other Ambulatory Visit (HOSPITAL_COMMUNITY): Payer: Self-pay | Admitting: *Deleted

## 2023-07-28 DIAGNOSIS — I4819 Other persistent atrial fibrillation: Secondary | ICD-10-CM

## 2023-07-28 NOTE — Telephone Encounter (Signed)
 Patient converted back into NSR 10 days ago- cardioversion canceled for tomorrow. Follow up routinely but pt will call if issues arise.

## 2023-07-29 ENCOUNTER — Ambulatory Visit (HOSPITAL_COMMUNITY): Admission: RE | Admit: 2023-07-29 | Source: Home / Self Care | Admitting: Cardiology

## 2023-07-29 ENCOUNTER — Encounter (HOSPITAL_COMMUNITY): Admission: RE | Payer: Self-pay | Source: Home / Self Care

## 2023-07-29 DIAGNOSIS — I4819 Other persistent atrial fibrillation: Secondary | ICD-10-CM

## 2023-07-29 SURGERY — CARDIOVERSION (CATH LAB)
Anesthesia: General

## 2023-07-30 ENCOUNTER — Ambulatory Visit (HOSPITAL_COMMUNITY): Admitting: Physician Assistant

## 2023-07-30 LAB — CBC
Hematocrit: 45.2 % (ref 37.5–51.0)
Hemoglobin: 14.7 g/dL (ref 13.0–17.7)
MCH: 29.3 pg (ref 26.6–33.0)
MCHC: 32.5 g/dL (ref 31.5–35.7)
MCV: 90 fL (ref 79–97)
Platelets: 216 x10E3/uL (ref 150–450)
RBC: 5.01 x10E6/uL (ref 4.14–5.80)
RDW: 13 % (ref 11.6–15.4)
WBC: 5.4 x10E3/uL (ref 3.4–10.8)

## 2023-07-30 LAB — BASIC METABOLIC PANEL WITH GFR
BUN/Creatinine Ratio: 15 (ref 10–24)
BUN: 14 mg/dL (ref 8–27)
CO2: 23 mmol/L (ref 20–29)
Calcium: 9.6 mg/dL (ref 8.6–10.2)
Chloride: 101 mmol/L (ref 96–106)
Creatinine, Ser: 0.95 mg/dL (ref 0.76–1.27)
Glucose: 96 mg/dL (ref 70–99)
Potassium: 4.5 mmol/L (ref 3.5–5.2)
Sodium: 141 mmol/L (ref 134–144)
eGFR: 81 mL/min/1.73 (ref >59)

## 2023-08-05 ENCOUNTER — Other Ambulatory Visit (HOSPITAL_BASED_OUTPATIENT_CLINIC_OR_DEPARTMENT_OTHER): Payer: Self-pay

## 2023-08-07 ENCOUNTER — Ambulatory Visit (HOSPITAL_COMMUNITY): Admitting: Physician Assistant

## 2023-08-25 ENCOUNTER — Ambulatory Visit (HOSPITAL_COMMUNITY): Admitting: Internal Medicine

## 2023-09-03 DIAGNOSIS — R3914 Feeling of incomplete bladder emptying: Secondary | ICD-10-CM | POA: Diagnosis not present

## 2023-09-12 ENCOUNTER — Other Ambulatory Visit: Payer: Self-pay

## 2023-09-12 ENCOUNTER — Telehealth: Payer: Self-pay

## 2023-09-12 NOTE — Telephone Encounter (Signed)
 Pharmacy Quality Measure Review  This patient is appearing on a report for being at risk of failing the adherence measure for cholesterol (statin) and hypertension (ACEi/ARB) medications this calendar year.   Medication: Atorvastatin , Losartan  Last fill date: Atorvastatin  05/22/2023 for 90 day supply; Losartan  06/24/2023 for 90 day supply.  Left voicemail for patient to return my call at their convenience. and MyChart message sent to patient.   Powell Gallus, PharmD, MPH PGY1 Community-Based Pharmacy Resident

## 2023-09-13 ENCOUNTER — Other Ambulatory Visit (HOSPITAL_BASED_OUTPATIENT_CLINIC_OR_DEPARTMENT_OTHER): Payer: Self-pay

## 2023-09-13 ENCOUNTER — Other Ambulatory Visit (HOSPITAL_COMMUNITY): Payer: Self-pay | Admitting: Physician Assistant

## 2023-09-15 ENCOUNTER — Other Ambulatory Visit (HOSPITAL_BASED_OUTPATIENT_CLINIC_OR_DEPARTMENT_OTHER): Payer: Self-pay

## 2023-09-15 MED ORDER — LOSARTAN POTASSIUM 25 MG PO TABS
25.0000 mg | ORAL_TABLET | Freq: Every day | ORAL | 2 refills | Status: AC
  Filled 2023-09-15: qty 90, 90d supply, fill #0
  Filled 2023-12-10: qty 90, 90d supply, fill #1

## 2023-09-16 ENCOUNTER — Other Ambulatory Visit (HOSPITAL_BASED_OUTPATIENT_CLINIC_OR_DEPARTMENT_OTHER): Payer: Self-pay

## 2023-09-16 ENCOUNTER — Other Ambulatory Visit: Payer: Self-pay

## 2023-09-16 MED ORDER — TAMSULOSIN HCL 0.4 MG PO CAPS
0.4000 mg | ORAL_CAPSULE | Freq: Every day | ORAL | 1 refills | Status: AC
  Filled 2023-09-16: qty 90, 90d supply, fill #0
  Filled 2023-12-10: qty 90, 90d supply, fill #1

## 2023-09-29 ENCOUNTER — Ambulatory Visit (HOSPITAL_COMMUNITY)
Admission: RE | Admit: 2023-09-29 | Discharge: 2023-09-29 | Disposition: A | Discharge status: Home / Self Care | Source: Ambulatory Visit | Attending: Internal Medicine | Admitting: Internal Medicine

## 2023-09-29 ENCOUNTER — Encounter (HOSPITAL_COMMUNITY): Payer: Self-pay | Admitting: Internal Medicine

## 2023-09-29 VITALS — BP 116/76 | HR 74 | Ht 66.0 in | Wt 154.2 lb

## 2023-09-29 DIAGNOSIS — Z5181 Encounter for therapeutic drug level monitoring: Secondary | ICD-10-CM

## 2023-09-29 DIAGNOSIS — I4819 Other persistent atrial fibrillation: Secondary | ICD-10-CM | POA: Diagnosis not present

## 2023-09-29 DIAGNOSIS — Z79899 Other long term (current) drug therapy: Secondary | ICD-10-CM

## 2023-09-29 DIAGNOSIS — D6869 Other thrombophilia: Secondary | ICD-10-CM | POA: Diagnosis not present

## 2023-09-29 NOTE — Progress Notes (Signed)
 Primary Care Physician: Jodie Lavern CROME, MD Referring Physician: Dr. Micky EP: Dr. Cindie Carlin CROME Ronnie Black is a 78 y.o. male with a h/o afib, frequent cardioversions in  the past. He has had afib/flutter ablation in 2012  and afib ablation fall of 2018. He was on norpace  at one time, changed to amiodarone  which was stopped shortly after last ablation, then back to Norpace . Unfortunately, he continued to have symptomatic breakthrough episodes of afib and the decision was made to stop Norpace  and load on dofetilide .   On follow up today, patient presents for dofetilide  admission. He states he stopped Norpace  5 days ago and has not missed any doses of anticoagulation in the last 3 weeks. He is in SR today.    F/u in afib clinic, 06/01/20. He is one week s/p tikosyn  load. He did well maintaining SR in the hospital and  was d/c on 500 mcg dofetilide   bid with stable qtc. He feels well and has been staying  in rhythm.   F/u in afib clinic, 02/13/21. He is staying in rhythm. Unfortunately, he lost his wife suddenly in July 2022. He is exercising regulary. He is compliant with his drugs.   F/u in afib clinic, 05/13/21. He is here for Tikosyn  surveillance. He has established with Dr. Cindie as of March of this year with  Dr. Kelsie leaving the practice.  He is doing well staying in SR. Compliant with Tikosyn .   F/u in afib clinic, 8/29 for Tikosyn  surveillance. He has not noted any afib. He does note elevated BP's at home 140-150 range which have slowly creeped up and is concerned he may need BP med. His ekg showed some bigeminy PVC's but on ausculation he sounded regular. Qt stable. Repeat ECG reassuring no longer showing PVCs.  Follow up in the AF clinic 04/01/22. He is doing well overall. He has not noted any episodes of Afib. He is scheduled for right hip replacement on 3/22 and has been instructed to stop Eliquis  on 3/19. He has noted sporadic episodes of rapid regular heart rate (HR 100s)  about once every 2 months. These episodes will occur randomly and typically resolve after a few hours. Intermittent nosebleeds from October - December but this resolved on its own (he was followed by ENT). He has been compliant with Tikosyn  and Eliquis .   On follow up 04/26/22, patient underwent hip replacement on 04/11/21 and has done well with recovery. He was found to be tachycardic at rehab with rates in the 120s bpm. After 3 hours EMS was called and strips demonstrated atrial flutter. He was taken to the ED but spontaneously converted to SR en route. He has done well since that time with no further episodes.   Follow up in the AF clinic 01/20/23. Patient reports that he had one brief episode of afib in November that resolved quickly. He denies any bleeding issues on anticoagulation. He remains very active.   Follow up 07/08/23. Patient returns for follow up for atrial fibrillation and dofetilide  monitoring. He reports occasional episodes of afib that resolve spontaneously after a few hours. There does not appear to be any specific trigger. No bleeding issues on anticoagulation.   On follow up 09/29/23, patient is currently in NSR. He notes increased burden of Afib with episodes now lasting for several days. He notes most recent episodes 6/18-22, 7/24-27, 7/30-8/2, and 8/15-17. No missed doses of Tikosyn .   Today, he  denies symptoms of chest pain, shortness  of breath, orthopnea, PND, lower extremity edema, dizziness, presyncope, syncope, snoring, daytime somnolence, bleeding, or neurologic sequela. The patient is tolerating medications without difficulties and is otherwise without complaint today.    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 180 tablet 3   atorvastatin  (LIPITOR) 10 MG tablet Take 1 tablet (10 mg total) by mouth daily. (Patient taking differently: Take 10 mg by mouth at bedtime.) 90 tablet 3   dofetilide  (TIKOSYN ) 500 MCG capsule Take 1 capsule (500 mcg total) by mouth 2 (two) times daily. 180 capsule 3   fluticasone  (CUTIVATE ) 0.005 % ointment Apply 1 Application topically as needed.     losartan  (COZAAR ) 25 MG tablet Take 1 tablet (25 mg total) by mouth daily. 90 tablet 2   melatonin 5 MG TABS Take 5 mg by mouth at bedtime.     metoprolol  tartrate (LOPRESSOR ) 25 MG tablet Take 1 tablet (25 mg total) by mouth 2 (two) times daily. 180 tablet 1   Multiple Vitamin (MULTIVITAMIN WITH MINERALS) TABS tablet Take 1 tablet by mouth every Monday, Tuesday, Wednesday, Thursday, and Friday.     nitrofurantoin , macrocrystal-monohydrate, (MACROBID ) 100 MG capsule Take 1 capsule (100 mg total) by mouth 2 (two) times daily. 14 capsule 0   sodium chloride  (OCEAN) 0.65 % SOLN nasal spray Place 1 spray into both nostrils as needed (dryness).     tamsulosin  (FLOMAX ) 0.4 MG CAPS capsule Take 1 capsule (0.4 mg total) by mouth daily. 90 capsule 1   No current facility-administered medications for this encounter.    ROS- All systems are reviewed and negative except as per the HPI above  Physical Exam: Vitals:   09/29/23 0909  BP: 116/76  Pulse: 74  Weight: 69.9 kg  Height: 5' 6 (1.676 m)    GEN- The patient is well appearing, alert and oriented x 3 today.   Neck - no JVD or carotid bruit noted Lungs- Clear to ausculation bilaterally, normal work of breathing Heart- Regular rate and rhythm, no murmurs, rubs or gallops, PMI not laterally displaced Extremities- no  clubbing, cyanosis, or edema Skin - no rash or ecchymosis noted   ECG today demonstrates Vent. rate 74 BPM PR interval 202 ms QRS duration 96 ms QT/QTcB 426/472 ms P-R-T axes 63 73 -81 Normal sinus rhythm Incomplete right bundle branch block T wave abnormality, consider inferior ischemia T wave abnormality, consider anterolateral ischemia Prolonged QT Abnormal ECG When compared with ECG of 11-Jul-2023 11:27, Sinus rhythm has replaced Atrial flutter   Echo 11/23/19 1. The apical views are concerning for apical LV hypertrophy. . Left  ventricular ejection fraction, by estimation, is 60 to 65%. The left  ventricle has normal function. The left ventricle has no regional wall  motion abnormalities. 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 Left ventricular diastolic parameters are indeterminate.   2. Right ventricular systolic function is normal. The right ventricular  size is normal.   3. The mitral valve is normal in structure. Mild mitral valve  regurgitation. No evidence of mitral stenosis.   4. The aortic valve is normal in structure. Aortic valve regurgitation is  not visualized. No aortic stenosis is present.    CHA2DS2-VASc Score = 5  The patient's score is based upon: CHF History: 1 HTN History: 1 Diabetes History: 0 Stroke History: 0 Vascular Disease History: 1 Age Score: 2 Gender Score: 0       ASSESSMENT AND PLAN: Persistent Atrial Fibrillation/atrial flutter The patient's CHA2DS2-VASc score is 5, indicating a 7.2% annual risk of stroke.   S/p ablation 03/2010 and 11/21/16 S/p dofetilide  loading 05/2020  Patient is currently in NSR. Discussion of rhythm control options. After discussion, patient wishes to discuss repeat ablation with Dr. Cindie. We discussed PFA technology and procedure being same day. He is very interested in doing ablation to lower burden.    Secondary Hypercoagulable State (ICD10:  D68.69) The patient is at significant risk for  stroke/thromboembolism based upon his CHA2DS2-VASc Score of 5.  Continue Apixaban  (Eliquis ).  No missed doses.   High risk medication monitoring (ICD10: J342684) Patient requires ongoing monitoring for anti-arrhythmic medication which has the potential to cause life threatening arrhythmias or AV block. Qtc stable. Continue Tikosyn  500 mcg BID. Bmet from July stable and [place] from June stable.   HCM Non obstructive on echo  HTN Stable today.   Follow up with Dr. Cindie to discuss ablation .    Dorn Heinrich, PA-C Afib Clinic [facility] [address] [place], [place] [address] [phone]

## 2023-09-30 ENCOUNTER — Other Ambulatory Visit (HOSPITAL_BASED_OUTPATIENT_CLINIC_OR_DEPARTMENT_OTHER): Payer: Self-pay

## 2023-09-30 ENCOUNTER — Other Ambulatory Visit: Payer: Self-pay

## 2023-09-30 ENCOUNTER — Other Ambulatory Visit (HOSPITAL_COMMUNITY): Payer: Self-pay

## 2023-09-30 ENCOUNTER — Telehealth: Payer: Self-pay

## 2023-09-30 DIAGNOSIS — Z23 Encounter for immunization: Secondary | ICD-10-CM

## 2023-09-30 MED ORDER — COVID-19 MRNA VAC-TRIS(PFIZER) 30 MCG/0.3ML IM SUSY
0.5000 mL | PREFILLED_SYRINGE | Freq: Once | INTRAMUSCULAR | 0 refills | Status: DC
  Filled 2023-09-30: qty 0.5, 1d supply, fill #0

## 2023-09-30 NOTE — Telephone Encounter (Signed)
 Copied from CRM [id number]. Topic: Clinical - Medication Question >> Sep 30, 2023  8:39 AM Vena HERO wrote: Reason for CRM: pt has appt at 1130 to get covid vax but needs script sent to  [facility] [address], [place], [place] [address] [phone] He asks you to please call him to verify this has been done before he leaves to go to his appt so he knows its been completed, thank you  Rx sent to pharmacy

## 2023-10-01 ENCOUNTER — Other Ambulatory Visit: Payer: Self-pay | Admitting: *Deleted

## 2023-10-01 ENCOUNTER — Other Ambulatory Visit (HOSPITAL_BASED_OUTPATIENT_CLINIC_OR_DEPARTMENT_OTHER): Payer: Self-pay

## 2023-10-01 ENCOUNTER — Other Ambulatory Visit: Payer: Self-pay

## 2023-10-01 ENCOUNTER — Other Ambulatory Visit: Payer: Self-pay | Admitting: Cardiology

## 2023-10-01 DIAGNOSIS — Z23 Encounter for immunization: Secondary | ICD-10-CM

## 2023-10-01 MED ORDER — COVID-19 MRNA VAC-TRIS(PFIZER) 30 MCG/0.3ML IM SUSY: 0.3000 mL | PREFILLED_SYRINGE | Freq: Once | INTRAMUSCULAR | 0 refills | Status: DC

## 2023-10-01 MED ORDER — COVID-19 MRNA VAC-TRIS(PFIZER) 30 MCG/0.3ML IM SUSY: 0.3000 mL | PREFILLED_SYRINGE | Freq: Once | INTRAMUSCULAR | 0 refills | Status: AC

## 2023-10-01 NOTE — Addendum Note (Signed)
 Addended by: Kellee Sittner on: 10/01/2023 01:57 PM   Modules accepted: Orders

## 2023-10-01 NOTE — Telephone Encounter (Signed)
 Copied from CRM [id number]. Topic: Clinical - Prescription Issue >> Sep 30, 2023  3:16 PM Ronnie Black wrote: Reason for CRM: Patient requested his COVID vaccine to be sent to [facility] [address], [place], [place] [address] [phone]  RX printed and placed at the front desk for him to come pick it up. Per his request.

## 2023-10-02 ENCOUNTER — Other Ambulatory Visit: Payer: Self-pay

## 2023-10-02 ENCOUNTER — Other Ambulatory Visit (HOSPITAL_BASED_OUTPATIENT_CLINIC_OR_DEPARTMENT_OTHER): Payer: Self-pay

## 2023-10-02 ENCOUNTER — Telehealth: Payer: Self-pay

## 2023-10-02 MED ORDER — METOPROLOL TARTRATE 25 MG PO TABS
25.0000 mg | ORAL_TABLET | Freq: Two times a day (BID) | ORAL | 1 refills | Status: DC
  Filled 2023-10-02: qty 30, 15d supply, fill #0
  Filled 2023-10-04: qty 60, 30d supply, fill #0

## 2023-10-02 NOTE — Telephone Encounter (Signed)
 Copied from CRM [id number]. Topic: Clinical - Medication Question >> Sep 30, 2023  8:39 AM Vena HERO wrote: Reason for CRM: pt has appt at 1130 to get covid vax but needs script sent to  [facility] [address], [place], [place] [address] [phone] He asks you to please call him to verify this has been done before he leaves to go to his appt so he knows its been completed, thank you >> Sep 30, 2023  3:06 PM Mia F wrote: Pt called to confirm script for COVID shot was sent to the pharmacy. Notes show it was sent   Spoke with pt and he stated that he already has a script and he contacted [facility] to get the [facility] vaccine and they told him to bring it to them. Pt stated that he will just [facility] to [facility] to get vaccine.

## 2023-10-04 ENCOUNTER — Other Ambulatory Visit (HOSPITAL_BASED_OUTPATIENT_CLINIC_OR_DEPARTMENT_OTHER): Payer: Self-pay

## 2023-11-01 ENCOUNTER — Other Ambulatory Visit: Payer: Self-pay | Admitting: Cardiology

## 2023-11-03 ENCOUNTER — Other Ambulatory Visit: Payer: Self-pay

## 2023-11-04 ENCOUNTER — Other Ambulatory Visit (HOSPITAL_BASED_OUTPATIENT_CLINIC_OR_DEPARTMENT_OTHER): Payer: Self-pay

## 2023-11-04 MED ORDER — METOPROLOL TARTRATE 25 MG PO TABS
25.0000 mg | ORAL_TABLET | Freq: Two times a day (BID) | ORAL | 1 refills | Status: DC
  Filled 2023-11-04: qty 30, 15d supply, fill #0
  Filled 2023-11-15: qty 30, 15d supply, fill #1

## 2023-11-05 DIAGNOSIS — Z8546 Personal history of malignant neoplasm of prostate: Secondary | ICD-10-CM | POA: Diagnosis not present

## 2023-11-05 DIAGNOSIS — N1330 Unspecified hydronephrosis: Secondary | ICD-10-CM | POA: Diagnosis not present

## 2023-11-05 DIAGNOSIS — R3914 Feeling of incomplete bladder emptying: Secondary | ICD-10-CM | POA: Diagnosis not present

## 2023-11-05 DIAGNOSIS — N401 Enlarged prostate with lower urinary tract symptoms: Secondary | ICD-10-CM | POA: Diagnosis not present

## 2023-11-11 NOTE — Progress Notes (Unsigned)
 Electrophysiology Office Follow up Visit Note:    Date:  11/12/2023   ID:  Ronnie Black, DOB Mar 18, 1945, MRN [id number]  PCP:  Ronnie Lavern LITTIE, MD  [facility] Cardiologist:  Ronnie ONEIDA HOLTS, MD  [facility] Electrophysiologist:  Ronnie ONEIDA HOLTS, MD    Interval History:     Ronnie Black is a 78 y.o. male who presents for a follow up visit.   Patient saw Ronnie Black in the A-fib clinic September 29, 2023.  He has had multiple prior catheter ablations for his atrial fibrillation and flutter.  He still takes Tikosyn  for his arrhythmia.  At the last appointment in September, he reported episodes of atrial fibrillation lasting several days at a time.  November 21, 2016 A-fib ablation procedure records were reviewed.  Patient had PACs originating from the coronary sinus.  During that procedure the left superior and right inferior pulmonary veins demonstrated return of electrical activity.  A WACA ablation technique was performed that day to reisolate the pulmonary veins.  The posterior wall was also targeted.         Past medical, surgical, social and family history were reviewed.  ROS:   Please see the history of present illness.    All other systems reviewed and are negative.  EKGs/Labs/Other Studies Reviewed:    The following studies were reviewed today:  September 29, 2023 EKG reviewed.  Sinus rhythm.  QTc 472 ms.   EKG Interpretation Date/Time:  Wednesday November 12 2023 13:47:12 EDT Ventricular Rate:  101 PR Interval:    QRS Duration:  90 QT Interval:  386 QTC Calculation: 500 R Axis:   62  Text Interpretation: Atrial fibrillation with rapid ventricular response Confirmed by Black Ronnie [phone]) on 11/12/2023 1:52:46 PM    Physical Exam:    VS:  BP 118/60 (BP Location: Right Arm, Patient Position: Sitting, Cuff Size: Normal)   Pulse (!) 101   Ht 5' 6 (1.676 m)   Wt 152 lb 9.6 oz (69.2 kg)   SpO2 99%   BMI 24.63 kg/m     Wt Readings from  Last 3 Encounters:  11/12/23 152 lb 9.6 oz (69.2 kg)  09/29/23 154 lb 3.2 oz (69.9 kg)  07/11/23 148 lb 12.8 oz (67.5 kg)     GEN: no distress CARD: RRR, No MRG RESP: No IWOB. CTAB.      ASSESSMENT:    1. Persistent atrial fibrillation (HCC)   2. Encounter for long-term (current) use of high-risk medication    PLAN:    In order of problems listed above:  #Persistent atrial fibrillation #High risk med monitoring-Tikosyn  2 prior catheter ablations with Ronnie Black.  Still having symptomatic breakthrough episodes of atrial fibrillation.  During the most recent procedure 2 of the 4 pulmonary veins required isolation.  The posterior wall was also ablated.  I discussed treatment options today.  1 option would be to continue with the current Tikosyn  therapy and except a fairly low burden of arrhythmia.  Another option would be to pursue redo catheter ablation.  The third option would be to stop his Tikosyn  and transition to amiodarone .  I discussed the pros and cons of each option.   After our discussion, the patient elected to proceed with a more conservative strategy.  He will stay on Tikosyn  for now and monitor the burden of his A-fib closely.  If his burden increases, he will consider starting amiodarone  or pursuing redo catheter ablation.  Need to update his BMP and  mag today  Continue Eliquis   I discussed my upcoming departure from Ronnie Black during today's clinic appointment.  We will transition his care to Ronnie Black.  Follow-up 4 months with A-fib clinic.   Signed, Ronnie Holts, MD, [facility], [facility] 11/12/2023 1:52 PM    Electrophysiology [facility]

## 2023-11-12 ENCOUNTER — Encounter: Payer: Self-pay | Admitting: Cardiology

## 2023-11-12 ENCOUNTER — Other Ambulatory Visit: Payer: Self-pay

## 2023-11-12 ENCOUNTER — Ambulatory Visit: Attending: Cardiology | Admitting: Cardiology

## 2023-11-12 VITALS — BP 118/60 | HR 101 | Ht 66.0 in | Wt 152.6 lb

## 2023-11-12 DIAGNOSIS — Z79899 Other long term (current) drug therapy: Secondary | ICD-10-CM

## 2023-11-12 DIAGNOSIS — I4819 Other persistent atrial fibrillation: Secondary | ICD-10-CM

## 2023-11-12 NOTE — Patient Instructions (Signed)
 Medication Instructions:  Your physician recommends that you continue on your current medications as directed. Please refer to the Current Medication list given to you today.  *If you need a refill on your cardiac medications before your next appointment, please call your pharmacy*  Lab Work: TODAY: BMET and Mag  Follow-Up: At [facility], you and your health needs are our priority.  As part of our continuing mission to provide you with exceptional heart care, our providers are all part of one team.  This team includes your primary Cardiologist (physician) and Advanced Practice Providers or APPs (Physician Assistants and Nurse Practitioners) who all work together to provide you with the care you need, when you need it.  Your next appointment:   4 months  Provider:   You will follow up in the Atrial Fibrillation Clinic located at [facility]. Your provider will be: Clint R. Fenton, PA-C or Fairy Heinrich, PA-C

## 2023-11-13 ENCOUNTER — Other Ambulatory Visit (HOSPITAL_BASED_OUTPATIENT_CLINIC_OR_DEPARTMENT_OTHER): Payer: Self-pay

## 2023-11-13 LAB — BASIC METABOLIC PANEL WITH GFR
BUN/Creatinine Ratio: 16 (ref 10–24)
BUN: 14 mg/dL (ref 8–27)
CO2: 23 mmol/L (ref 20–29)
Calcium: 9.6 mg/dL (ref 8.6–10.2)
Chloride: 100 mmol/L (ref 96–106)
Creatinine, Ser: 0.89 mg/dL (ref 0.76–1.27)
Glucose: 92 mg/dL (ref 70–99)
Potassium: 4.8 mmol/L (ref 3.5–5.2)
Sodium: 141 mmol/L (ref 134–144)
eGFR: 87 mL/min/1.73 (ref >59)

## 2023-11-13 LAB — MAGNESIUM: Magnesium: 2.2 mg/dL (ref 1.6–2.3)

## 2023-11-13 MED ORDER — FLUZONE HIGH-DOSE 0.5 ML IM SUSY
0.5000 mL | PREFILLED_SYRINGE | Freq: Once | INTRAMUSCULAR | 0 refills | Status: AC
  Filled 2023-11-13: qty 0.5, 1d supply, fill #0

## 2023-11-15 ENCOUNTER — Other Ambulatory Visit: Payer: Self-pay | Admitting: Cardiology

## 2023-11-15 ENCOUNTER — Other Ambulatory Visit (HOSPITAL_BASED_OUTPATIENT_CLINIC_OR_DEPARTMENT_OTHER): Payer: Self-pay

## 2023-11-18 ENCOUNTER — Other Ambulatory Visit (HOSPITAL_BASED_OUTPATIENT_CLINIC_OR_DEPARTMENT_OTHER): Payer: Self-pay

## 2023-11-18 MED ORDER — METOPROLOL TARTRATE 25 MG PO TABS
25.0000 mg | ORAL_TABLET | Freq: Two times a day (BID) | ORAL | 3 refills | Status: AC
  Filled 2023-11-18: qty 90, 45d supply, fill #0
  Filled 2023-12-30: qty 90, 45d supply, fill #1
  Filled 2024-02-15: qty 180, 90d supply, fill #2

## 2023-11-24 ENCOUNTER — Other Ambulatory Visit: Payer: Self-pay | Admitting: Cardiology

## 2023-11-24 ENCOUNTER — Encounter: Payer: Self-pay | Admitting: Radiology

## 2023-11-25 ENCOUNTER — Other Ambulatory Visit (HOSPITAL_BASED_OUTPATIENT_CLINIC_OR_DEPARTMENT_OTHER): Payer: Self-pay

## 2023-11-25 ENCOUNTER — Telehealth: Payer: Self-pay

## 2023-11-25 MED ORDER — DOFETILIDE 500 MCG PO CAPS
500.0000 ug | ORAL_CAPSULE | Freq: Two times a day (BID) | ORAL | 3 refills | Status: AC
  Filled 2023-11-25: qty 180, 90d supply, fill #0
  Filled 2024-02-24: qty 180, 90d supply, fill #1

## 2023-11-25 NOTE — Telephone Encounter (Signed)
 Copied from CRM #[id number]. Topic: Clinical - Request for Lab/Test Order >> Nov 25, 2023 10:38 AM Nessti S wrote: Reason for CRM: pt would like to schedule labs. Call back number [phone]   ----------------------------------------------------------------------- From previous Reason for Contact - Medical Advice: Reason for CRM: pt would like to schedule for labs  Pt requesting lab to be placed for up coming physical in dec. Please Advise  Message sent to PCP to address

## 2023-11-26 ENCOUNTER — Other Ambulatory Visit: Payer: Self-pay

## 2023-11-26 DIAGNOSIS — Z0000 Encounter for general adult medical examination without abnormal findings: Secondary | ICD-10-CM

## 2023-12-30 ENCOUNTER — Encounter: Payer: Self-pay | Admitting: Cardiology

## 2024-01-06 ENCOUNTER — Other Ambulatory Visit

## 2024-01-06 DIAGNOSIS — Z0000 Encounter for general adult medical examination without abnormal findings: Secondary | ICD-10-CM

## 2024-01-06 LAB — COMPREHENSIVE METABOLIC PANEL WITH GFR
ALT: 24 U/L (ref 0–53)
AST: 21 U/L (ref 5–37)
Albumin: 4.6 g/dL (ref 3.5–5.2)
Alkaline Phosphatase: 58 U/L (ref 39–117)
BUN: 13 mg/dL (ref 6–23)
CO2: 30 meq/L (ref 19–32)
Calcium: 9.3 mg/dL (ref 8.4–10.5)
Chloride: 102 meq/L (ref 96–112)
Creatinine, Ser: 0.91 mg/dL (ref 0.40–1.50)
GFR: 79.55 mL/min (ref >60.00)
Glucose, Bld: 87 mg/dL (ref 70–99)
Potassium: 4.1 meq/L (ref 3.5–5.1)
Sodium: 138 meq/L (ref 135–145)
Total Bilirubin: 1 mg/dL (ref 0.2–1.2)
Total Protein: 6.8 g/dL (ref 6.0–8.3)

## 2024-01-06 LAB — CBC WITH DIFFERENTIAL/PLATELET
Basophils Absolute: 0 K/uL (ref 0.0–0.1)
Basophils Relative: 0.8 % (ref 0.0–3.0)
Eosinophils Absolute: 0.2 K/uL (ref 0.0–0.7)
Eosinophils Relative: 3.6 % (ref 0.0–5.0)
HCT: 45.2 % (ref 39.0–52.0)
Hemoglobin: 15.3 g/dL (ref 13.0–17.0)
Lymphocytes Relative: 32.7 % (ref 12.0–46.0)
Lymphs Abs: 1.9 K/uL (ref 0.7–4.0)
MCHC: 33.9 g/dL (ref 30.0–36.0)
MCV: 87.4 fl (ref 78.0–100.0)
Monocytes Absolute: 0.5 K/uL (ref 0.1–1.0)
Monocytes Relative: 8.7 % (ref 3.0–12.0)
Neutro Abs: 3.2 K/uL (ref 1.4–7.7)
Neutrophils Relative %: 54.2 % (ref 43.0–77.0)
Platelets: 206 K/uL (ref 150.0–400.0)
RBC: 5.17 Mil/uL (ref 4.22–5.81)
RDW: 13.6 % (ref 11.5–15.5)
WBC: 5.9 K/uL (ref 4.0–10.5)

## 2024-01-06 LAB — LIPID PANEL
Cholesterol: 142 mg/dL (ref 28–200)
HDL: 48 mg/dL (ref >39.00)
LDL Cholesterol: 78 mg/dL (ref 0–99)
NonHDL: 94.37
Total CHOL/HDL Ratio: 3
Triglycerides: 81 mg/dL (ref 0.0–149.0)
VLDL: 16.2 mg/dL (ref 0.0–40.0)

## 2024-01-06 LAB — TSH: TSH: 6.84 u[IU]/mL — ABNORMAL HIGH (ref 0.35–5.50)

## 2024-01-06 LAB — MAGNESIUM: Magnesium: 2.1 mg/dL (ref 1.5–2.5)

## 2024-01-09 ENCOUNTER — Other Ambulatory Visit: Payer: Self-pay | Admitting: Medical Genetics

## 2024-01-13 ENCOUNTER — Encounter: Payer: Self-pay | Admitting: Family Medicine

## 2024-01-13 ENCOUNTER — Ambulatory Visit: Admitting: Family Medicine

## 2024-01-13 VITALS — BP 122/70 | HR 78 | Temp 97.0°F | Ht 66.0 in | Wt 154.6 lb

## 2024-01-13 DIAGNOSIS — J301 Allergic rhinitis due to pollen: Secondary | ICD-10-CM

## 2024-01-13 DIAGNOSIS — I10 Essential (primary) hypertension: Secondary | ICD-10-CM

## 2024-01-13 DIAGNOSIS — Z7901 Long term (current) use of anticoagulants: Secondary | ICD-10-CM

## 2024-01-13 DIAGNOSIS — Z8546 Personal history of malignant neoplasm of prostate: Secondary | ICD-10-CM | POA: Diagnosis not present

## 2024-01-13 DIAGNOSIS — R7989 Other specified abnormal findings of blood chemistry: Secondary | ICD-10-CM | POA: Diagnosis not present

## 2024-01-13 DIAGNOSIS — I421 Obstructive hypertrophic cardiomyopathy: Secondary | ICD-10-CM

## 2024-01-13 DIAGNOSIS — I4891 Unspecified atrial fibrillation: Secondary | ICD-10-CM

## 2024-01-13 LAB — T3, FREE: T3, Free: 3.7 pg/mL (ref 2.3–4.2)

## 2024-01-13 LAB — T4, FREE: Free T4: 0.74 ng/dL (ref 0.60–1.60)

## 2024-01-14 ENCOUNTER — Encounter: Payer: Self-pay | Admitting: Family Medicine

## 2024-01-14 ENCOUNTER — Ambulatory Visit: Payer: Self-pay | Admitting: Family Medicine

## 2024-01-14 NOTE — Progress Notes (Signed)
 " Subjective  Chief Complaint  Patient presents with   Annual Exam    Annual Exam with PCP. Having some post nasal drip, Left arm tingling when he wakes up. Concerned about lab results, had blood drawn last week.     HPI: Ronnie Black is a 78 y.o. male who presents to [facility] [place] today for a Male Wellness Visit. He also has the concerns and/or needs as listed above in the chief complaint. These will be addressed in addition to the Health Maintenance Visit.   Wellness Visit: annual visit with health maintenance review and exam   HM: screens are current. Active and healthy overall.   Body mass index is 24.95 kg/m. Wt Readings from Last 3 Encounters:  01/13/24 154 lb 9.6 oz (70.1 kg)  11/12/23 152 lb 9.6 oz (69.2 kg)  09/29/23 154 lb 3.2 oz (69.9 kg)   Chronic disease management visit and/or acute problem visit: Discussed the use of AI scribe software for clinical note transcription with the patient, who gave verbal consent to proceed.  History of Present Illness Ronnie Black is an 78 year old male who presents with elevated TSH levels.  Elevated thyroid  stimulating hormone (tsh) - Recent laboratory testing revealed a mildly elevated TSH level, slightly above normal range. - No symptoms of hypothyroidism, including fatigue, cold intolerance, or mood changes. - Not currently taking any thyroid  medications.  Postnasal drip and upper airway symptoms - Persistent postnasal drip, particularly at night, resulting in throat congestion upon waking. - Symptoms are not significantly bothersome. - Occasional sneezing without frequent allergy symptoms. - Has access to several nasal sprays, including Flonase .  Atrial fibrillation - History of atrial fibrillation with several episodes in the past six months. - Each episode lasted two to three days and resolved spontaneously. - Episodes have not significantly impacted daily activities or energy levels. -  Experienced an episode while on vacation in [place] without limitation of activities.  Lower urinary tract symptoms and h/o prostate cancer - History of prostate issues, previously treated with brachytherapy. - Increased urinary frequency and sensation of incomplete bladder emptying, attributed to a stretched bladder wall. - No urinary leakage. - Under urologist care and has undergone a urodynamic study.  HTN is well controlled. Feeling well. Taking medications w/o adverse effects. No symptoms of CHF, angina; no palpitations, sob, cp or lower extremity edema. Compliant with meds.  HOCM: reviewed cards notes; stable  Physical activity and energy level - Physically active, recently walked over 20,000 steps in a day while on vacation. - Good energy levels and no significant fatigue.    Assessment  1. Abnormal TSH   2. Atrial fibrillation status post cardioversion (HCC)   3. Current use of long term anticoagulation   4. Essential hypertension   5. HOCM (hypertrophic obstructive cardiomyopathy) (HCC)   6. History of prostate cancer   7. Seasonal allergic rhinitis due to pollen      Plan  Male Wellness Visit: Age appropriate Health Maintenance and Prevention measures were discussed with patient. Included topics are cancer screening recommendations, ways to keep healthy (see AVS) including dietary and exercise recommendations, regular eye and dental care, use of seat belts, and avoidance of moderate alcohol use and tobacco use.  BMI: discussed patient's BMI and encouraged positive lifestyle modifications to help get to or maintain a target BMI. HM needs and immunizations were addressed and ordered. See below for orders. See HM and immunization section for updates. Routine labs and  screening tests ordered including cmp, cbc and lipids where appropriate. Discussed recommendations regarding Vit D and calcium  supplementation (see AVS)  Chronic disease f/u and/or acute problem visit: (deemed  necessary to be done in addition to the wellness visit): Assessment and Plan Assessment & Plan Evaluation of abnormal TSH and possible subclinical hypothyroidism Elevated TSH slightly above normal, indicating possible subclinical hypothyroidism. No symptoms of hypothyroidism at this level. Potential for progression to overt hypothyroidism if untreated. - Ordered T3 and T4 tests to assess thyroid  hormone levels. - If thyroid  hormone levels are low, will initiate levothyroxine therapy. - If thyroid  hormone levels are normal, will consider rechecking in 6 months to a year.  Atrial fibrillation Intermittent episodes over the past six months, lasting two to three days, resolving spontaneously. No significant symptoms or impact on daily activities. Energy levels remain good. H/o ablatioin  - PAF on meds w/o sxs other than palpitations. On eloquis. No changes  Bladder dysfunction with incomplete emptying Incomplete bladder emptying with increased frequency post-brachytherapy. No significant obstruction or leakage. Bladder wall weakened or stretched, leading to incomplete emptying. Urodynamic study performed, no immediate intervention planned. - Continue monitoring bladder function and symptoms.  History of prostate cancer Prostate cancer treated with brachytherapy. No current significant issues related to prostate cancer. Erectile dysfunction likely related to brachytherapy rather than testosterone levels.  HTN is controlled. Nl renal function. No med changes HOCM stable per cards  Allergies: restart flonase  and add zyrtec if needed     Follow up:  6 mo Commons side effects, risks, benefits, and alternatives for medications and treatment plan prescribed today were discussed, and the patient expressed understanding of the given instructions. Patient is instructed to call or message via MyChart if he/she has any questions or concerns regarding our treatment plan. No barriers to understanding were  identified. We discussed Red Flag symptoms and signs in detail. Patient expressed understanding regarding what to do in case of urgent or emergency type symptoms.  Medication list was reconciled, printed and provided to the patient in AVS. Patient instructions and summary information was reviewed with the patient as documented in the AVS. This note was prepared with assistance of Dragon voice recognition software. Occasional wrong-word or sound-a-like substitutions may have occurred due to the inherent limitations of voice recognition software  Orders Placed This Encounter  Procedures   T4, free   T3, free   No orders of the defined types were placed in this encounter.    Patient Active Problem List   Diagnosis Date Noted   Hypercoagulable state due to persistent atrial fibrillation (HCC) 04/01/2022   Aortic atherosclerosis 05/22/2021   History of prostate cancer 05/08/2021   Essential hypertension 12/01/2019   Current use of long term anticoagulation 11/06/2018   Atrial fibrillation status post cardioversion (HCC) 02/24/2017   Family history of colon cancer 10/01/2012   Mixed hyperlipidemia 08/13/2008   HOCM (hypertrophic obstructive cardiomyopathy) (HCC) 08/13/2008   Status post total replacement of right hip 04/12/2022   Bilateral primary osteoarthritis of hip 12/04/2020   Persistent atrial fibrillation (HCC) 11/21/2016   Benign prostatic hyperplasia without lower urinary tract symptoms 11/07/2016   Herniated lumbar disc without myelopathy 07/08/2012   ED (erectile dysfunction) 01/07/2012   Actinic keratosis 01/07/2012   Seborrheic keratosis 01/07/2012   CAROTID BRUIT 08/24/2008   Health Maintenance  Topic Date Due   Medicare Annual Wellness (AWV)  06/09/2021   COVID-19 Vaccine (11 - Moderna risk 2025-26 season) 03/31/2024   DTaP/Tdap/Td (2 - Td or  Tdap) 11/08/2026   Pneumococcal Vaccine: 50+ Years  Completed   Influenza Vaccine  Completed   Zoster Vaccines- Shingrix    Completed   Meningococcal B Vaccine  Aged Out   Colonoscopy  Discontinued   Hepatitis C Screening  Discontinued   Immunization History  Administered Date(s) Administered   Fluad  Quad(high Dose 65+) 11/02/2019, 10/04/2020   Fluad  Trivalent(High Dose 65+) 11/19/2022   INFLUENZA, HIGH DOSE SEASONAL PF 11/02/2015, 11/07/2016, 11/13/2017, 11/22/2021, 11/13/2023   Influenza, Seasonal, Injecte, Preservative Fre 11/04/2013   Moderna Covid-19 Vaccine Bivalent Booster 5yrs & up 11/02/2020   Moderna SARS-COV2 Booster Vaccination 06/29/2020   Moderna Sars-Covid-2 Vaccination 02/02/2019, 03/03/2019, 03/24/2019, 12/07/2019   Pfizer(Comirnaty )Fall Seasonal Vaccine 12 years and older 10/22/2021, 04/05/2022, 10/10/2022, 04/25/2023   Pneumococcal Conjugate-13 12/02/2013   Pneumococcal Polysaccharide-23 01/07/2012   Respiratory Syncytial Virus Vaccine ,Recomb Aduvanted(Arexvy ) 09/27/2021   Tdap 11/07/2016   Unspecified SARS-COV-2 Vaccination 10/02/2023   Zoster Recombinant(Shingrix ) 12/04/2018, 05/10/2019, 06/03/2019   Zoster, Live 01/22/2010   We updated and reviewed the patient's past history in detail and it is documented below. Allergies: Patient has no known allergies. Past Medical History  has a past medical history of Complication of anesthesia, Current use of long term anticoagulation (11/06/2018), DDD (degenerative disc disease), HLD (hyperlipidemia), Hypertension, Hypertr obst cardiomyop, Persistent atrial fibrillation (HCC), Pneumonia, and Prostate cancer (HCC) (05/08/2021). Past Surgical History Patient  has a past surgical history that includes Appendectomy (1970); atrial fibrillation ablation (03/27/2010); Wisdom tooth extraction; Skin cancer excision; Cardioversion; TEE without cardioversion (04/26/2011); Cardioversion (04/26/2011); TEE without cardioversion (N/A, 04/28/2012); Cardioversion (N/A, 04/28/2012); Cardioversion (N/A, 07/28/2012); Cardioversion (N/A, 08/14/2012); Cardioversion  (N/A, 08/11/2014); Cardioversion (N/A, 08/26/2014); Cardioversion (N/A, 10/10/2016); ATRIAL FIBRILLATION ABLATION (N/A, 11/21/2016); Cardioversion (N/A, 12/04/2016); Cardioversion (N/A, 06/09/2018); Cardioversion (N/A, 08/17/2019); Cardioversion (N/A, 11/18/2019); Cardioversion (N/A, 03/09/2020); Radioactive seed implant (N/A, 08/06/2021); SPACE OAR INSTILLATION (N/A, 08/06/2021); Total hip arthroplasty (Right, 04/12/2022); and Hip arthroscopy. Social History Patient  reports that he quit smoking about 48 years ago. His smoking use included cigarettes. He started smoking about 63 years ago. He has a 7.5 pack-year smoking history. He has never used smokeless tobacco. He reports current alcohol use of about 6.0 standard drinks of alcohol per week. He reports that he does not use drugs. Family History family history includes Colon cancer in his father and mother; Coronary artery disease in an other family member; Lung cancer in his mother. Review of Systems: Constitutional: negative for fever or malaise Ophthalmic: negative for photophobia, double vision or loss of vision Cardiovascular: negative for chest pain, dyspnea on exertion, or new LE swelling Respiratory: negative for SOB or persistent cough Gastrointestinal: negative for abdominal pain, change in bowel habits or melena Genitourinary: negative for dysuria or gross hematuria Musculoskeletal: negative for new gait disturbance or muscular weakness Integumentary: negative for new or persistent rashes Neurological: negative for TIA or stroke symptoms Psychiatric: negative for SI or delusions Allergic/Immunologic: negative for hives  Patient Care Team    Relationship Specialty Notifications Start End  Jodie Lavern CROME, MD PCP - General Family Medicine  11/06/18   Cindie Ole DASEN, MD PCP - Cardiology Cardiology  05/22/21   Inocencio Soyla Lunger, MD PCP - Electrophysiology Cardiology  11/12/23   Kelsie Agent, MD (Inactive)  Cardiology  12/10/10    [facility] Consulting Physician Urology  11/06/18   Neda Jennet LABOR, MD Consulting Physician Pulmonary Disease  11/06/18   Luis Purchase, MD Consulting Physician Gastroenterology  12/01/19   Matilda Senior, MD Consulting Physician Urology  03/09/21  Objective  Vitals: BP 122/70 (BP Location: Left Arm, Patient Position: Sitting, Cuff Size: Normal)   Pulse 78   Temp (!) 97 F (36.1 C) (Temporal)   Ht 5' 6 (1.676 m)   Wt 154 lb 9.6 oz (70.1 kg)   SpO2 98%   BMI 24.95 kg/m  General:  Well developed, well nourished, no acute distress  Psych:  Alert and orientedx3,normal mood and affect HEENT:  Normocephalic, atraumatic, non-icteric sclera,  oropharynx is clear without mass or exudate, supple neck without adenopathy, or thyromegaly Cardiovascular:  Normal S1, S2, RRR without gallop, rub or murmur,  Respiratory:  Good breath sounds bilaterally, CTAB with normal respiratory effort Gastrointestinal: normal bowel sounds, soft, non-tender, no noted masses. No HSM MSK: Joints are without erythema or swelling.  Skin:  Warm, no rashes Neurologic:    Mental status is normal.  Gross motor and sensory exams are normal. Stable gait. No tremor   Office Visit on 01/13/2024  Component Date Value Ref Range Status   Free T4 01/13/2024 0.74  0.60 - 1.60 ng/dL Final   T3, Free 87/76/7974 3.7  2.3 - 4.2 pg/mL Final   Lab Results  Component Value Date   TSH 6.84 (H) 01/06/2024   Lab Results  Component Value Date   CHOL 142 01/06/2024   HDL 48.00 01/06/2024   LDLCALC 78 01/06/2024   TRIG 81.0 01/06/2024   CHOLHDL 3 01/06/2024   Lab Results  Component Value Date   WBC 5.9 01/06/2024   HGB 15.3 01/06/2024   HCT 45.2 01/06/2024   MCV 87.4 01/06/2024   PLT 206.0 01/06/2024   Lab Results  Component Value Date   NA 138 01/06/2024   CL 102 01/06/2024   K 4.1 01/06/2024   CO2 30 01/06/2024   BUN 13 01/06/2024   CREATININE 0.91 01/06/2024   GFR 79.55 01/06/2024    CALCIUM  9.3 01/06/2024   ALBUMIN  4.6 01/06/2024   GLUCOSE 87 01/06/2024     "

## 2024-01-14 NOTE — Progress Notes (Signed)
 See mychart note  Good morning, Your levels are ok; the T4 is in the low normal range.  I recommend rechecking these values in 6 months. Please schedule a follow up office visit with me.   And yes, for now all is well without need for intervention; however, if levels lower over time, we will need to start thyroid  medication.  Happy holidays. Dr. Jodie

## 2024-01-14 NOTE — Telephone Encounter (Signed)
 Please review and advise.

## 2024-01-16 ENCOUNTER — Other Ambulatory Visit (HOSPITAL_BASED_OUTPATIENT_CLINIC_OR_DEPARTMENT_OTHER): Payer: Self-pay

## 2024-01-26 ENCOUNTER — Other Ambulatory Visit (HOSPITAL_BASED_OUTPATIENT_CLINIC_OR_DEPARTMENT_OTHER): Payer: Self-pay

## 2024-01-26 ENCOUNTER — Ambulatory Visit: Admitting: Family Medicine

## 2024-01-26 ENCOUNTER — Encounter: Payer: Self-pay | Admitting: Family Medicine

## 2024-01-26 VITALS — BP 102/60 | HR 110 | Temp 97.0°F | Ht 66.0 in | Wt 155.0 lb

## 2024-01-26 DIAGNOSIS — I10 Essential (primary) hypertension: Secondary | ICD-10-CM | POA: Diagnosis not present

## 2024-01-26 DIAGNOSIS — R059 Cough, unspecified: Secondary | ICD-10-CM

## 2024-01-26 DIAGNOSIS — I4819 Other persistent atrial fibrillation: Secondary | ICD-10-CM | POA: Diagnosis not present

## 2024-01-26 MED ORDER — BENZONATATE 200 MG PO CAPS
200.0000 mg | ORAL_CAPSULE | Freq: Two times a day (BID) | ORAL | 0 refills | Status: AC | PRN
  Filled 2024-01-26: qty 30, 15d supply, fill #0

## 2024-01-26 NOTE — Patient Instructions (Addendum)
 It was very nice to see you today!  VISIT SUMMARY: You visited us  today due to a cough and congestion that you've had for nearly two weeks. Your symptoms are improving, and there is no suspicion of a bacterial infection. We discussed your current medications and provided a new prescription to help with your cough.  YOUR PLAN: ACUTE UPPER RESPIRATORY INFECTION: You have a viral infection causing your cough and congestion, but your symptoms are getting better. -Take Tessalon  for cough suppression, twice daily as needed. -Continue using saline spray for nasal congestion. -Stay hydrated and use natural remedies like tea with honey and salt water  gargling. -Report any worsening symptoms, including fever, chills, or body aches.  PERSISTENT ATRIAL FIBRILLATION: Your recent episodes of atrial fibrillation were likely triggered by your upper respiratory infection but have resolved on their own. -Continue your current management with dofetilide .  Return if symptoms worsen or fail to improve.   Take care, Dr Kennyth  PLEASE NOTE:  If you had any lab tests, please let us  know if you have not heard back within a few days. You may see your results on mychart before we have a chance to review them but we will give you a call once they are reviewed by us .   If we ordered any referrals today, please let us  know if you have not heard from their office within the next week.   If you had any urgent prescriptions sent in today, please check with the pharmacy within an hour of our visit to make sure the prescription was transmitted appropriately.   Please try these tips to maintain a healthy lifestyle:  Eat at least 3 REAL meals and 1-2 snacks per day.  Aim for no more than 5 hours between eating.  If you eat breakfast, please do so within one hour of getting up.   Each meal should contain half fruits/vegetables, one quarter protein, and one quarter carbs (no bigger than a computer mouse)  Cut down on  sweet beverages. This includes juice, soda, and sweet tea.   Drink at least 1 glass of water  with each meal and aim for at least 8 glasses per day  Exercise at least 150 minutes every week.

## 2024-01-26 NOTE — Progress Notes (Signed)
" ° °  Ronnie Black is a 79 y.o. male who presents today for an office visit.  Assessment/Plan:  Cough  No red flags.  He is likely recovering from a viral upper respiratory infection.  Reassuring exam today.  We discussed typical course of postviral cough.  Will send in Tessalon  for symptomatic management.  Encouraged hydration.  We discussed reasons to return to care.  Follow-up as needed.  A-fib Patient in A-fib today which he attributes to his recent illness.  He follows with cardiology and is currently on Tikosyn , metoprolol , and Eliquis   Essential hypertension Blood pressure at goal today on metoprolol  tartrate 25 mg twice daily and losartan  25 mg daily.     Subjective:  HPI:  See assessment / plan for status of chronic conditions.   Discussed the use of AI scribe software for clinical note transcription with the patient, who gave verbal consent to proceed.  History of Present Illness Ronnie Black is an 79 year old male with atrial fibrillation who presents with cough and congestion.  He has been experiencing cough and congestion for nearly two weeks, starting the day before Christmas. Initially, he developed a sore throat and low energy, which progressed to nasal congestion. He tested negative for COVID-19 twice and did not experience fever or chills. The cough is sometimes 'uncontrolled' with a tickle in his throat, particularly in the evening and at night.  He has been using saline spray and fluticasone  nasal spray for three days, which may have helped with the nasal congestion. He completed a package of Mucinex (guaifenesin) and has been using Ricola cough drops, though he is uncertain of their effectiveness. He has been gargling with warm salt water  and drinking tea with honey as additional remedies.  Due to his atrial fibrillation and dofetilide  use, he cannot take typical decongestants containing pseudoephedrine or cough suppressants with dextromethorphan. He mentions  episodes of atrial fibrillation starting the previous Sunday. No ear issues or significant nasal congestion recently.  He reports feeling better yesterday after a period of plateauing symptoms, and he has not been coughing up mucus, describing his cough as dry. He has been staying hydrated and using herbal remedies due to medication restrictions.         Objective:  Physical Exam: BP 102/60   Pulse (!) 110   Temp (!) 97 F (36.1 C) (Temporal)   Ht 5' 6 (1.676 m)   Wt 155 lb (70.3 kg)   SpO2 98%   BMI 25.02 kg/m   Gen: No acute distress, resting comfortably HEENT: TMs clear bilaterally.  OP clear CV: Irregular rate and rhythm with no murmurs appreciated Pulm: Normal work of breathing, clear to auscultation bilaterally with no crackles, wheezes, or rhonchi Neuro: Grossly normal, moves all extremities Psych: Normal affect and thought content      Yazaira Speas M. Kennyth, MD 01/26/2024 11:14 AM  "

## 2024-02-02 ENCOUNTER — Other Ambulatory Visit (HOSPITAL_BASED_OUTPATIENT_CLINIC_OR_DEPARTMENT_OTHER): Payer: Self-pay

## 2024-02-16 ENCOUNTER — Other Ambulatory Visit: Payer: Self-pay

## 2024-02-19 ENCOUNTER — Ambulatory Visit (HOSPITAL_COMMUNITY)
Admission: RE | Admit: 2024-02-19 | Discharge: 2024-02-19 | Disposition: A | Discharge status: Home / Self Care | Source: Ambulatory Visit | Attending: Physician Assistant | Admitting: Physician Assistant

## 2024-02-19 VITALS — BP 118/68 | HR 80 | Ht 66.0 in | Wt 155.2 lb

## 2024-02-19 DIAGNOSIS — I4819 Other persistent atrial fibrillation: Secondary | ICD-10-CM | POA: Diagnosis not present

## 2024-02-19 DIAGNOSIS — Z79899 Other long term (current) drug therapy: Secondary | ICD-10-CM

## 2024-02-19 DIAGNOSIS — D6869 Other thrombophilia: Secondary | ICD-10-CM

## 2024-02-19 DIAGNOSIS — I4891 Unspecified atrial fibrillation: Secondary | ICD-10-CM | POA: Diagnosis not present

## 2024-02-19 DIAGNOSIS — Z5181 Encounter for therapeutic drug level monitoring: Secondary | ICD-10-CM | POA: Diagnosis not present

## 2024-02-19 NOTE — Progress Notes (Signed)
 "     Primary Care Physician: Jodie Lavern CROME, MD Referring Physician: Dr. Micky EP: Dr. Cindie Carlin CROME Ronnie Black is a 79 y.o. male with a h/o afib, frequent cardioversions in  the past. He has had afib/flutter ablation in 2012  and afib ablation fall of 2018. He was on norpace  at one time, changed to amiodarone  which was stopped shortly after last ablation, then back to Norpace . Unfortunately, he continued to have symptomatic breakthrough episodes of afib and the decision was made to stop Norpace  and load on dofetilide .   On follow up today, patient presents for dofetilide  admission. He states he stopped Norpace  5 days ago and has not missed any doses of anticoagulation in the last 3 weeks. He is in SR today.    F/u in afib clinic, 06/01/20. He is one week s/p tikosyn  load. He did well maintaining SR in the hospital and  was d/c on 500 mcg dofetilide   bid with stable qtc. He feels well and has been staying  in rhythm.   F/u in afib clinic, 02/13/21. He is staying in rhythm. Unfortunately, he lost his wife suddenly in July 2022. He is exercising regulary. He is compliant with his drugs.   F/u in afib clinic, 05/13/21. He is here for Tikosyn  surveillance. He has established with Dr. Cindie as of March of this year with  Dr. Kelsie leaving the practice.  He is doing well staying in SR. Compliant with Tikosyn .   F/u in afib clinic, 8/29 for Tikosyn  surveillance. He has not noted any afib. He does note elevated BP's at home 140-150 range which have slowly creeped up and is concerned he may need BP med. His ekg showed some bigeminy PVC's but on ausculation he sounded regular. Qt stable. Repeat ECG reassuring no longer showing PVCs.  Follow up in the AF clinic 04/01/22. He is doing well overall. He has not noted any episodes of Afib. He is scheduled for right hip replacement on 3/22 and has been instructed to stop Eliquis  on 3/19. He has noted sporadic episodes of rapid regular heart rate (HR 100s)  about once every 2 months. These episodes will occur randomly and typically resolve after a few hours. Intermittent nosebleeds from October - December but this resolved on its own (he was followed by ENT). He has been compliant with Tikosyn  and Eliquis .   On follow up 04/26/22, patient underwent hip replacement on 04/11/21 and has done well with recovery. He was found to be tachycardic at rehab with rates in the 120s bpm. After 3 hours EMS was called and strips demonstrated atrial flutter. He was taken to the ED but spontaneously converted to SR en route. He has done well since that time with no further episodes.   Follow up in the AF clinic 01/20/23. Patient reports that he had one brief episode of afib in November that resolved quickly. He denies any bleeding issues on anticoagulation. He remains very active.   Follow up 07/08/23. Patient returns for follow up for atrial fibrillation and dofetilide  monitoring. He reports occasional episodes of afib that resolve spontaneously after a few hours. There does not appear to be any specific trigger. No bleeding issues on anticoagulation.   On follow up 09/29/23, patient is currently in NSR. He notes increased burden of Afib with episodes now lasting for several days. He notes most recent episodes 6/18-22, 7/24-27, 7/30-8/2, and 8/15-17. No missed doses of Tikosyn .   Follow up 02/19/24. Patient returns for follow up  for atrial fibrillation and dofetilide  monitoring. He reports that he has had 9 episodes of afib since his last visit. Each episode resolved in about one day or less. There were no specific triggers that he could identify. No bleeding issues on anticoagulation.   Today, he  denies symptoms of palpitations, chest pain, shortness of breath, orthopnea, PND, lower extremity edema, dizziness, presyncope, syncope, snoring, daytime somnolence, bleeding, or neurologic sequela. The patient is tolerating medications without difficulties and is otherwise without  complaint today.    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 180 tablet 3   atorvastatin  (LIPITOR) 10 MG tablet Take 1 tablet (10 mg total) by mouth daily. 90 tablet 3   benzonatate  (TESSALON ) 200 MG capsule Take 1 capsule (200 mg total) by mouth 2 (two) times daily as needed for cough. (Patient taking differently: Take 200 mg by mouth as needed for cough.) 30 capsule 0   dofetilide  (TIKOSYN ) 500 MCG capsule Take 1 capsule (500 mcg total) by mouth 2 (two) times daily. 180 capsule 3   fluticasone  (CUTIVATE ) 0.005 % ointment Apply 1 Application topically as needed.     losartan  (COZAAR ) 25 MG tablet Take 1 tablet (25 mg total) by mouth daily. 90 tablet 2   melatonin 5 MG TABS Take 5 mg by mouth at bedtime.     metoprolol  tartrate (LOPRESSOR ) 25 MG tablet Take 1 tablet (25 mg total) by mouth 2 (two) times daily. 90 tablet 3   Multiple Vitamin (MULTIVITAMIN WITH MINERALS) TABS tablet Take 1 tablet by mouth every Monday, Tuesday, Wednesday, Thursday, and Friday.     sodium chloride  (OCEAN) 0.65 % SOLN nasal spray Place 1 spray into both nostrils as needed (dryness).     tamsulosin  (FLOMAX ) 0.4 MG CAPS capsule Take 1 capsule (0.4 mg total) by mouth daily. 90 capsule 1   No current facility-administered medications for this encounter.    ROS- All systems are reviewed and negative except as per the HPI above  Physical Exam: Vitals:   02/19/24 1536  BP: 118/68  Pulse: 80   Weight: 70.4 kg  Height: 5' 6 (1.676 m)    GEN: Well nourished, well developed in no acute distress CARDIAC: Regular rate and rhythm, no murmurs, rubs, gallops RESPIRATORY:  Clear to auscultation without rales, wheezing or rhonchi  ABDOMEN: Soft, non-tender, non-distended EXTREMITIES:  No edema; No deformity    EKG Interpretation Date/Time:  Thursday February 19 2024 15:42:49 EST Ventricular Rate:  80 PR Interval:  194 QRS Duration:  90 QT Interval:  374 QTC Calculation: 431 R Axis:   71  Text Interpretation: Sinus rhythm Premature atrial complexes T wave abnormality, consider inferolateral ischemia Abnormal ECG When compared with ECG of 12-Nov-2023 13:47, Sinus rhythm has replaced Atrial fibrillation Confirmed by Stanely Sexson ([id number]) on 02/19/2024 4:25:13 PM    Echo 11/23/19 1. The apical views are concerning for apical LV hypertrophy. SABRA  Left  ventricular ejection fraction, by estimation, is 60 to 65%. The left  ventricle has normal function. The left ventricle has no regional wall  motion abnormalities. 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 Left ventricular diastolic parameters are indeterminate.   2. Right ventricular systolic function is normal. The right ventricular  size is normal.   3. The mitral valve is normal in structure. Mild mitral valve  regurgitation. No evidence of mitral stenosis.   4. The aortic valve is normal in structure. Aortic valve regurgitation is  not visualized. No aortic stenosis is present.    CHA2DS2-VASc Score = 5  The patient's score is based upon: CHF History: 1 HTN History: 1 Diabetes History: 0 Stroke History: 0 Vascular Disease History: 1 Age Score: 2 Gender Score: 0       ASSESSMENT AND PLAN: Persistent Atrial Fibrillation/atrial flutter (ICD10:  I48.19) The patient's CHA2DS2-VASc score is 5, indicating a 7.2% annual risk of stroke.   S/p afib ablation 03/2010 and 11/21/16 S/p dofetilide  loading 05/2020 Patient  maintaining SR the majority of the time. He is happy with his present treatment.  Continue dofetilide  500 mcg BID Continue Eliquis  5 mg BID Continue Lopressor  25 mg BID  Secondary Hypercoagulable State (ICD10:  D68.69) The patient is at significant risk for stroke/thromboembolism based upon his CHA2DS2-VASc Score of 5.  Continue Apixaban  (Eliquis ). No bleeding issues.   High Risk Medication Monitoring (ICD 10: [id number]) Patient requires ongoing monitoring for anti-arrhythmic medication which has the potential to cause life threatening arrhythmias. QT interval on ECG acceptable for dofetilide  monitoring. Recent lab work reviewed.   HCM Non obstructive on echo  HTN Stable on current regimen   Follow up with Dr Inocencio per recall to establish care. AF clinic 6 months after that visit.    Daril Kicks PA-C [facility] [address] [place], [place] [address] [phone]   "

## 2024-02-24 ENCOUNTER — Other Ambulatory Visit (HOSPITAL_BASED_OUTPATIENT_CLINIC_OR_DEPARTMENT_OTHER): Payer: Self-pay

## 2024-02-25 ENCOUNTER — Other Ambulatory Visit (HOSPITAL_BASED_OUTPATIENT_CLINIC_OR_DEPARTMENT_OTHER): Payer: Self-pay
